# Patient Record
Sex: Female | Born: 1935
Health system: Southern US, Community
[De-identification: ages and names within clinical notes are randomized; demographics above are authoritative.]

## PROBLEM LIST (undated history)

## (undated) DIAGNOSIS — Z87898 Personal history of other specified conditions: Secondary | ICD-10-CM

## (undated) DIAGNOSIS — M2042 Other hammer toe(s) (acquired), left foot: Secondary | ICD-10-CM

## (undated) DIAGNOSIS — C539 Malignant neoplasm of cervix uteri, unspecified: Secondary | ICD-10-CM

## (undated) DIAGNOSIS — M1990 Unspecified osteoarthritis, unspecified site: Secondary | ICD-10-CM

## (undated) DIAGNOSIS — Z8781 Personal history of (healed) traumatic fracture: Secondary | ICD-10-CM

## (undated) DIAGNOSIS — M4846XA Fatigue fracture of vertebra, lumbar region, initial encounter for fracture: Secondary | ICD-10-CM

## (undated) DIAGNOSIS — Z8739 Personal history of other diseases of the musculoskeletal system and connective tissue: Secondary | ICD-10-CM

## (undated) DIAGNOSIS — Z6822 Body mass index (BMI) 22.0-22.9, adult: Secondary | ICD-10-CM

## (undated) DIAGNOSIS — Z8673 Personal history of transient ischemic attack (TIA), and cerebral infarction without residual deficits: Secondary | ICD-10-CM

## (undated) DIAGNOSIS — I10 Essential (primary) hypertension: Secondary | ICD-10-CM

## (undated) DIAGNOSIS — Z8719 Personal history of other diseases of the digestive system: Secondary | ICD-10-CM

## (undated) HISTORY — PX: CARDIAC SURGERY: SHX584

## (undated) HISTORY — DX: Malignant neoplasm of cervix uteri, unspecified: C53.9

## (undated) HISTORY — PX: CATARACT EXTRACTION: SUR2

## (undated) HISTORY — DX: Personal history of other diseases of the musculoskeletal system and connective tissue: Z87.39

## (undated) HISTORY — DX: Personal history of other specified conditions: Z87.898

## (undated) HISTORY — PX: ABDOMINAL HYSTERECTOMY: SHX81

## (undated) HISTORY — DX: Essential (primary) hypertension: I10

## (undated) HISTORY — DX: Personal history of (healed) traumatic fracture: Z87.81

## (undated) HISTORY — DX: Body mass index (BMI) 22.0-22.9, adult: Z68.22

## (undated) HISTORY — DX: Personal history of transient ischemic attack (TIA), and cerebral infarction without residual deficits: Z86.73

## (undated) HISTORY — DX: Personal history of other diseases of the digestive system: Z87.19

## (undated) HISTORY — PX: SMALL INTESTINE SURGERY: SHX150

---

## 1988-11-12 HISTORY — PX: OTHER SURGICAL HISTORY: SHX169

## 2010-11-12 DIAGNOSIS — Z8673 Personal history of transient ischemic attack (TIA), and cerebral infarction without residual deficits: Secondary | ICD-10-CM

## 2010-11-12 HISTORY — DX: Personal history of transient ischemic attack (TIA), and cerebral infarction without residual deficits: Z86.73

## 2015-11-13 DIAGNOSIS — F0280 Dementia in other diseases classified elsewhere without behavioral disturbance: Secondary | ICD-10-CM

## 2015-11-13 HISTORY — DX: Dementia in other diseases classified elsewhere, unspecified severity, without behavioral disturbance, psychotic disturbance, mood disturbance, and anxiety: F02.80

## 2016-12-17 DIAGNOSIS — R69 Illness, unspecified: Secondary | ICD-10-CM | POA: Diagnosis not present

## 2016-12-17 DIAGNOSIS — R609 Edema, unspecified: Secondary | ICD-10-CM | POA: Diagnosis not present

## 2016-12-17 DIAGNOSIS — R0982 Postnasal drip: Secondary | ICD-10-CM | POA: Diagnosis not present

## 2016-12-17 DIAGNOSIS — M1990 Unspecified osteoarthritis, unspecified site: Secondary | ICD-10-CM | POA: Diagnosis not present

## 2016-12-17 DIAGNOSIS — Z5181 Encounter for therapeutic drug level monitoring: Secondary | ICD-10-CM | POA: Diagnosis not present

## 2016-12-17 DIAGNOSIS — Z952 Presence of prosthetic heart valve: Secondary | ICD-10-CM | POA: Diagnosis not present

## 2016-12-17 DIAGNOSIS — Z7901 Long term (current) use of anticoagulants: Secondary | ICD-10-CM | POA: Diagnosis not present

## 2016-12-17 DIAGNOSIS — Z8673 Personal history of transient ischemic attack (TIA), and cerebral infarction without residual deficits: Secondary | ICD-10-CM | POA: Diagnosis not present

## 2016-12-24 DIAGNOSIS — Z7901 Long term (current) use of anticoagulants: Secondary | ICD-10-CM | POA: Diagnosis not present

## 2016-12-24 DIAGNOSIS — Z952 Presence of prosthetic heart valve: Secondary | ICD-10-CM | POA: Diagnosis not present

## 2016-12-24 DIAGNOSIS — Z5181 Encounter for therapeutic drug level monitoring: Secondary | ICD-10-CM | POA: Diagnosis not present

## 2016-12-31 DIAGNOSIS — I8390 Asymptomatic varicose veins of unspecified lower extremity: Secondary | ICD-10-CM | POA: Diagnosis not present

## 2016-12-31 DIAGNOSIS — M154 Erosive (osteo)arthritis: Secondary | ICD-10-CM | POA: Diagnosis not present

## 2016-12-31 DIAGNOSIS — M1710 Unilateral primary osteoarthritis, unspecified knee: Secondary | ICD-10-CM | POA: Diagnosis not present

## 2016-12-31 DIAGNOSIS — M7582 Other shoulder lesions, left shoulder: Secondary | ICD-10-CM | POA: Diagnosis not present

## 2016-12-31 DIAGNOSIS — M170 Bilateral primary osteoarthritis of knee: Secondary | ICD-10-CM | POA: Diagnosis not present

## 2016-12-31 DIAGNOSIS — M810 Age-related osteoporosis without current pathological fracture: Secondary | ICD-10-CM | POA: Diagnosis not present

## 2016-12-31 DIAGNOSIS — M159 Polyosteoarthritis, unspecified: Secondary | ICD-10-CM | POA: Diagnosis not present

## 2016-12-31 DIAGNOSIS — R5381 Other malaise: Secondary | ICD-10-CM | POA: Diagnosis not present

## 2017-01-02 DIAGNOSIS — R55 Syncope and collapse: Secondary | ICD-10-CM | POA: Diagnosis not present

## 2017-01-02 DIAGNOSIS — I4891 Unspecified atrial fibrillation: Secondary | ICD-10-CM | POA: Diagnosis not present

## 2017-01-02 DIAGNOSIS — Z952 Presence of prosthetic heart valve: Secondary | ICD-10-CM | POA: Diagnosis not present

## 2017-01-02 DIAGNOSIS — Z8673 Personal history of transient ischemic attack (TIA), and cerebral infarction without residual deficits: Secondary | ICD-10-CM | POA: Diagnosis not present

## 2017-01-02 DIAGNOSIS — R609 Edema, unspecified: Secondary | ICD-10-CM | POA: Diagnosis not present

## 2017-01-02 DIAGNOSIS — R002 Palpitations: Secondary | ICD-10-CM | POA: Diagnosis not present

## 2017-01-03 DIAGNOSIS — Z7901 Long term (current) use of anticoagulants: Secondary | ICD-10-CM | POA: Diagnosis not present

## 2017-01-03 DIAGNOSIS — D890 Polyclonal hypergammaglobulinemia: Secondary | ICD-10-CM | POA: Diagnosis not present

## 2017-01-03 DIAGNOSIS — I10 Essential (primary) hypertension: Secondary | ICD-10-CM | POA: Diagnosis not present

## 2017-01-03 DIAGNOSIS — Z8781 Personal history of (healed) traumatic fracture: Secondary | ICD-10-CM | POA: Diagnosis not present

## 2017-01-03 DIAGNOSIS — Z5181 Encounter for therapeutic drug level monitoring: Secondary | ICD-10-CM | POA: Diagnosis not present

## 2017-01-03 DIAGNOSIS — E785 Hyperlipidemia, unspecified: Secondary | ICD-10-CM | POA: Diagnosis not present

## 2017-01-03 DIAGNOSIS — Z952 Presence of prosthetic heart valve: Secondary | ICD-10-CM | POA: Diagnosis not present

## 2017-01-03 DIAGNOSIS — I491 Atrial premature depolarization: Secondary | ICD-10-CM | POA: Diagnosis not present

## 2017-01-03 DIAGNOSIS — Z6822 Body mass index (BMI) 22.0-22.9, adult: Secondary | ICD-10-CM | POA: Diagnosis not present

## 2017-01-03 DIAGNOSIS — R931 Abnormal findings on diagnostic imaging of heart and coronary circulation: Secondary | ICD-10-CM | POA: Diagnosis not present

## 2017-01-15 DIAGNOSIS — Z880 Allergy status to penicillin: Secondary | ICD-10-CM | POA: Diagnosis not present

## 2017-01-15 DIAGNOSIS — I10 Essential (primary) hypertension: Secondary | ICD-10-CM | POA: Diagnosis not present

## 2017-01-15 DIAGNOSIS — Z823 Family history of stroke: Secondary | ICD-10-CM | POA: Diagnosis not present

## 2017-01-15 DIAGNOSIS — Z8673 Personal history of transient ischemic attack (TIA), and cerebral infarction without residual deficits: Secondary | ICD-10-CM | POA: Diagnosis not present

## 2017-01-15 DIAGNOSIS — I6523 Occlusion and stenosis of bilateral carotid arteries: Secondary | ICD-10-CM | POA: Diagnosis not present

## 2017-01-15 DIAGNOSIS — M069 Rheumatoid arthritis, unspecified: Secondary | ICD-10-CM | POA: Diagnosis not present

## 2017-01-15 DIAGNOSIS — R531 Weakness: Secondary | ICD-10-CM | POA: Diagnosis not present

## 2017-01-15 DIAGNOSIS — I639 Cerebral infarction, unspecified: Secondary | ICD-10-CM | POA: Diagnosis not present

## 2017-01-15 DIAGNOSIS — E785 Hyperlipidemia, unspecified: Secondary | ICD-10-CM | POA: Diagnosis not present

## 2017-01-15 DIAGNOSIS — R079 Chest pain, unspecified: Secondary | ICD-10-CM | POA: Diagnosis not present

## 2017-01-15 DIAGNOSIS — G459 Transient cerebral ischemic attack, unspecified: Secondary | ICD-10-CM | POA: Diagnosis not present

## 2017-01-15 DIAGNOSIS — I440 Atrioventricular block, first degree: Secondary | ICD-10-CM | POA: Diagnosis not present

## 2017-01-15 DIAGNOSIS — I480 Paroxysmal atrial fibrillation: Secondary | ICD-10-CM | POA: Diagnosis not present

## 2017-01-15 DIAGNOSIS — R69 Illness, unspecified: Secondary | ICD-10-CM | POA: Diagnosis not present

## 2017-01-15 DIAGNOSIS — R9431 Abnormal electrocardiogram [ECG] [EKG]: Secondary | ICD-10-CM | POA: Diagnosis not present

## 2017-01-15 DIAGNOSIS — Z952 Presence of prosthetic heart valve: Secondary | ICD-10-CM | POA: Diagnosis not present

## 2017-01-15 DIAGNOSIS — Z7901 Long term (current) use of anticoagulants: Secondary | ICD-10-CM | POA: Diagnosis not present

## 2017-01-16 DIAGNOSIS — G459 Transient cerebral ischemic attack, unspecified: Secondary | ICD-10-CM | POA: Diagnosis not present

## 2017-01-16 DIAGNOSIS — Z7901 Long term (current) use of anticoagulants: Secondary | ICD-10-CM | POA: Diagnosis not present

## 2017-01-16 DIAGNOSIS — M069 Rheumatoid arthritis, unspecified: Secondary | ICD-10-CM | POA: Diagnosis not present

## 2017-01-16 DIAGNOSIS — Z952 Presence of prosthetic heart valve: Secondary | ICD-10-CM | POA: Diagnosis not present

## 2017-01-16 DIAGNOSIS — E785 Hyperlipidemia, unspecified: Secondary | ICD-10-CM | POA: Diagnosis not present

## 2017-01-16 DIAGNOSIS — I480 Paroxysmal atrial fibrillation: Secondary | ICD-10-CM | POA: Diagnosis not present

## 2017-01-22 DIAGNOSIS — I10 Essential (primary) hypertension: Secondary | ICD-10-CM | POA: Diagnosis not present

## 2017-01-22 DIAGNOSIS — Z09 Encounter for follow-up examination after completed treatment for conditions other than malignant neoplasm: Secondary | ICD-10-CM | POA: Diagnosis not present

## 2017-01-22 DIAGNOSIS — E785 Hyperlipidemia, unspecified: Secondary | ICD-10-CM | POA: Diagnosis not present

## 2017-01-22 DIAGNOSIS — R778 Other specified abnormalities of plasma proteins: Secondary | ICD-10-CM | POA: Diagnosis not present

## 2017-01-22 DIAGNOSIS — M159 Polyosteoarthritis, unspecified: Secondary | ICD-10-CM | POA: Diagnosis not present

## 2017-01-22 DIAGNOSIS — Z952 Presence of prosthetic heart valve: Secondary | ICD-10-CM | POA: Diagnosis not present

## 2017-01-22 DIAGNOSIS — E871 Hypo-osmolality and hyponatremia: Secondary | ICD-10-CM | POA: Diagnosis not present

## 2017-01-22 DIAGNOSIS — R5383 Other fatigue: Secondary | ICD-10-CM | POA: Diagnosis not present

## 2017-01-22 DIAGNOSIS — R69 Illness, unspecified: Secondary | ICD-10-CM | POA: Diagnosis not present

## 2017-01-25 DIAGNOSIS — D472 Monoclonal gammopathy: Secondary | ICD-10-CM | POA: Diagnosis not present

## 2017-01-25 DIAGNOSIS — M810 Age-related osteoporosis without current pathological fracture: Secondary | ICD-10-CM | POA: Diagnosis not present

## 2017-01-25 DIAGNOSIS — E559 Vitamin D deficiency, unspecified: Secondary | ICD-10-CM | POA: Diagnosis not present

## 2017-01-25 DIAGNOSIS — R5383 Other fatigue: Secondary | ICD-10-CM | POA: Diagnosis not present

## 2017-01-25 DIAGNOSIS — M159 Polyosteoarthritis, unspecified: Secondary | ICD-10-CM | POA: Diagnosis not present

## 2017-02-05 DIAGNOSIS — M5136 Other intervertebral disc degeneration, lumbar region: Secondary | ICD-10-CM | POA: Diagnosis not present

## 2017-02-05 DIAGNOSIS — M9913 Subluxation complex (vertebral) of lumbar region: Secondary | ICD-10-CM | POA: Diagnosis not present

## 2017-02-06 DIAGNOSIS — Z7901 Long term (current) use of anticoagulants: Secondary | ICD-10-CM | POA: Diagnosis not present

## 2017-02-06 DIAGNOSIS — Z5181 Encounter for therapeutic drug level monitoring: Secondary | ICD-10-CM | POA: Diagnosis not present

## 2017-02-06 DIAGNOSIS — Z952 Presence of prosthetic heart valve: Secondary | ICD-10-CM | POA: Diagnosis not present

## 2017-02-06 DIAGNOSIS — Z8673 Personal history of transient ischemic attack (TIA), and cerebral infarction without residual deficits: Secondary | ICD-10-CM | POA: Diagnosis not present

## 2017-02-07 DIAGNOSIS — M9913 Subluxation complex (vertebral) of lumbar region: Secondary | ICD-10-CM | POA: Diagnosis not present

## 2017-02-07 DIAGNOSIS — M5136 Other intervertebral disc degeneration, lumbar region: Secondary | ICD-10-CM | POA: Diagnosis not present

## 2017-02-11 DIAGNOSIS — M9913 Subluxation complex (vertebral) of lumbar region: Secondary | ICD-10-CM | POA: Diagnosis not present

## 2017-02-11 DIAGNOSIS — M5136 Other intervertebral disc degeneration, lumbar region: Secondary | ICD-10-CM | POA: Diagnosis not present

## 2017-02-13 DIAGNOSIS — Z952 Presence of prosthetic heart valve: Secondary | ICD-10-CM | POA: Diagnosis not present

## 2017-02-13 DIAGNOSIS — Z5181 Encounter for therapeutic drug level monitoring: Secondary | ICD-10-CM | POA: Diagnosis not present

## 2017-02-13 DIAGNOSIS — M9913 Subluxation complex (vertebral) of lumbar region: Secondary | ICD-10-CM | POA: Diagnosis not present

## 2017-02-13 DIAGNOSIS — Z7901 Long term (current) use of anticoagulants: Secondary | ICD-10-CM | POA: Diagnosis not present

## 2017-02-13 DIAGNOSIS — M5136 Other intervertebral disc degeneration, lumbar region: Secondary | ICD-10-CM | POA: Diagnosis not present

## 2017-02-14 DIAGNOSIS — M9913 Subluxation complex (vertebral) of lumbar region: Secondary | ICD-10-CM | POA: Diagnosis not present

## 2017-02-14 DIAGNOSIS — M5136 Other intervertebral disc degeneration, lumbar region: Secondary | ICD-10-CM | POA: Diagnosis not present

## 2017-02-18 DIAGNOSIS — M5136 Other intervertebral disc degeneration, lumbar region: Secondary | ICD-10-CM | POA: Diagnosis not present

## 2017-02-18 DIAGNOSIS — M9913 Subluxation complex (vertebral) of lumbar region: Secondary | ICD-10-CM | POA: Diagnosis not present

## 2017-02-20 DIAGNOSIS — M5136 Other intervertebral disc degeneration, lumbar region: Secondary | ICD-10-CM | POA: Diagnosis not present

## 2017-02-20 DIAGNOSIS — Z5181 Encounter for therapeutic drug level monitoring: Secondary | ICD-10-CM | POA: Diagnosis not present

## 2017-02-20 DIAGNOSIS — Z952 Presence of prosthetic heart valve: Secondary | ICD-10-CM | POA: Diagnosis not present

## 2017-02-20 DIAGNOSIS — Z7901 Long term (current) use of anticoagulants: Secondary | ICD-10-CM | POA: Diagnosis not present

## 2017-02-20 DIAGNOSIS — M9913 Subluxation complex (vertebral) of lumbar region: Secondary | ICD-10-CM | POA: Diagnosis not present

## 2017-02-21 DIAGNOSIS — M5136 Other intervertebral disc degeneration, lumbar region: Secondary | ICD-10-CM | POA: Diagnosis not present

## 2017-02-21 DIAGNOSIS — M9913 Subluxation complex (vertebral) of lumbar region: Secondary | ICD-10-CM | POA: Diagnosis not present

## 2017-02-25 DIAGNOSIS — M9913 Subluxation complex (vertebral) of lumbar region: Secondary | ICD-10-CM | POA: Diagnosis not present

## 2017-02-25 DIAGNOSIS — M5136 Other intervertebral disc degeneration, lumbar region: Secondary | ICD-10-CM | POA: Diagnosis not present

## 2017-02-27 DIAGNOSIS — M5136 Other intervertebral disc degeneration, lumbar region: Secondary | ICD-10-CM | POA: Diagnosis not present

## 2017-02-27 DIAGNOSIS — M9913 Subluxation complex (vertebral) of lumbar region: Secondary | ICD-10-CM | POA: Diagnosis not present

## 2017-02-28 DIAGNOSIS — M9913 Subluxation complex (vertebral) of lumbar region: Secondary | ICD-10-CM | POA: Diagnosis not present

## 2017-02-28 DIAGNOSIS — M5136 Other intervertebral disc degeneration, lumbar region: Secondary | ICD-10-CM | POA: Diagnosis not present

## 2017-03-04 DIAGNOSIS — M9913 Subluxation complex (vertebral) of lumbar region: Secondary | ICD-10-CM | POA: Diagnosis not present

## 2017-03-04 DIAGNOSIS — M5136 Other intervertebral disc degeneration, lumbar region: Secondary | ICD-10-CM | POA: Diagnosis not present

## 2017-03-06 DIAGNOSIS — M5136 Other intervertebral disc degeneration, lumbar region: Secondary | ICD-10-CM | POA: Diagnosis not present

## 2017-03-06 DIAGNOSIS — Z5181 Encounter for therapeutic drug level monitoring: Secondary | ICD-10-CM | POA: Diagnosis not present

## 2017-03-06 DIAGNOSIS — I10 Essential (primary) hypertension: Secondary | ICD-10-CM | POA: Diagnosis not present

## 2017-03-06 DIAGNOSIS — Z952 Presence of prosthetic heart valve: Secondary | ICD-10-CM | POA: Diagnosis not present

## 2017-03-06 DIAGNOSIS — R69 Illness, unspecified: Secondary | ICD-10-CM | POA: Diagnosis not present

## 2017-03-06 DIAGNOSIS — R7309 Other abnormal glucose: Secondary | ICD-10-CM | POA: Diagnosis not present

## 2017-03-06 DIAGNOSIS — E785 Hyperlipidemia, unspecified: Secondary | ICD-10-CM | POA: Diagnosis not present

## 2017-03-06 DIAGNOSIS — M159 Polyosteoarthritis, unspecified: Secondary | ICD-10-CM | POA: Diagnosis not present

## 2017-03-06 DIAGNOSIS — M9913 Subluxation complex (vertebral) of lumbar region: Secondary | ICD-10-CM | POA: Diagnosis not present

## 2017-03-06 DIAGNOSIS — Z7901 Long term (current) use of anticoagulants: Secondary | ICD-10-CM | POA: Diagnosis not present

## 2017-03-06 DIAGNOSIS — E871 Hypo-osmolality and hyponatremia: Secondary | ICD-10-CM | POA: Diagnosis not present

## 2017-03-07 DIAGNOSIS — M5136 Other intervertebral disc degeneration, lumbar region: Secondary | ICD-10-CM | POA: Diagnosis not present

## 2017-03-07 DIAGNOSIS — M9913 Subluxation complex (vertebral) of lumbar region: Secondary | ICD-10-CM | POA: Diagnosis not present

## 2017-03-11 DIAGNOSIS — M5136 Other intervertebral disc degeneration, lumbar region: Secondary | ICD-10-CM | POA: Diagnosis not present

## 2017-03-11 DIAGNOSIS — M9913 Subluxation complex (vertebral) of lumbar region: Secondary | ICD-10-CM | POA: Diagnosis not present

## 2017-03-12 DIAGNOSIS — M5136 Other intervertebral disc degeneration, lumbar region: Secondary | ICD-10-CM | POA: Diagnosis not present

## 2017-03-12 DIAGNOSIS — M9913 Subluxation complex (vertebral) of lumbar region: Secondary | ICD-10-CM | POA: Diagnosis not present

## 2017-04-01 DIAGNOSIS — M9912 Subluxation complex (vertebral) of thoracic region: Secondary | ICD-10-CM | POA: Diagnosis not present

## 2017-04-01 DIAGNOSIS — M5134 Other intervertebral disc degeneration, thoracic region: Secondary | ICD-10-CM | POA: Diagnosis not present

## 2017-04-01 DIAGNOSIS — Z952 Presence of prosthetic heart valve: Secondary | ICD-10-CM | POA: Diagnosis not present

## 2017-04-01 DIAGNOSIS — Z7901 Long term (current) use of anticoagulants: Secondary | ICD-10-CM | POA: Diagnosis not present

## 2017-04-01 DIAGNOSIS — Z5181 Encounter for therapeutic drug level monitoring: Secondary | ICD-10-CM | POA: Diagnosis not present

## 2017-04-03 DIAGNOSIS — M5134 Other intervertebral disc degeneration, thoracic region: Secondary | ICD-10-CM | POA: Diagnosis not present

## 2017-04-03 DIAGNOSIS — M9912 Subluxation complex (vertebral) of thoracic region: Secondary | ICD-10-CM | POA: Diagnosis not present

## 2017-04-04 DIAGNOSIS — M5134 Other intervertebral disc degeneration, thoracic region: Secondary | ICD-10-CM | POA: Diagnosis not present

## 2017-04-04 DIAGNOSIS — M9912 Subluxation complex (vertebral) of thoracic region: Secondary | ICD-10-CM | POA: Diagnosis not present

## 2017-04-09 DIAGNOSIS — M5134 Other intervertebral disc degeneration, thoracic region: Secondary | ICD-10-CM | POA: Diagnosis not present

## 2017-04-09 DIAGNOSIS — M9912 Subluxation complex (vertebral) of thoracic region: Secondary | ICD-10-CM | POA: Diagnosis not present

## 2017-04-10 DIAGNOSIS — I872 Venous insufficiency (chronic) (peripheral): Secondary | ICD-10-CM | POA: Diagnosis not present

## 2017-04-10 DIAGNOSIS — M5134 Other intervertebral disc degeneration, thoracic region: Secondary | ICD-10-CM | POA: Diagnosis not present

## 2017-04-10 DIAGNOSIS — E785 Hyperlipidemia, unspecified: Secondary | ICD-10-CM | POA: Diagnosis not present

## 2017-04-10 DIAGNOSIS — M9912 Subluxation complex (vertebral) of thoracic region: Secondary | ICD-10-CM | POA: Diagnosis not present

## 2017-04-10 DIAGNOSIS — D179 Benign lipomatous neoplasm, unspecified: Secondary | ICD-10-CM | POA: Diagnosis not present

## 2017-04-10 DIAGNOSIS — I10 Essential (primary) hypertension: Secondary | ICD-10-CM | POA: Diagnosis not present

## 2017-04-10 DIAGNOSIS — R69 Illness, unspecified: Secondary | ICD-10-CM | POA: Diagnosis not present

## 2017-04-11 DIAGNOSIS — M5134 Other intervertebral disc degeneration, thoracic region: Secondary | ICD-10-CM | POA: Diagnosis not present

## 2017-04-11 DIAGNOSIS — M9912 Subluxation complex (vertebral) of thoracic region: Secondary | ICD-10-CM | POA: Diagnosis not present

## 2017-04-15 DIAGNOSIS — M9912 Subluxation complex (vertebral) of thoracic region: Secondary | ICD-10-CM | POA: Diagnosis not present

## 2017-04-15 DIAGNOSIS — M5134 Other intervertebral disc degeneration, thoracic region: Secondary | ICD-10-CM | POA: Diagnosis not present

## 2017-04-17 DIAGNOSIS — M5134 Other intervertebral disc degeneration, thoracic region: Secondary | ICD-10-CM | POA: Diagnosis not present

## 2017-04-17 DIAGNOSIS — M9912 Subluxation complex (vertebral) of thoracic region: Secondary | ICD-10-CM | POA: Diagnosis not present

## 2017-04-18 DIAGNOSIS — M9912 Subluxation complex (vertebral) of thoracic region: Secondary | ICD-10-CM | POA: Diagnosis not present

## 2017-04-18 DIAGNOSIS — M5134 Other intervertebral disc degeneration, thoracic region: Secondary | ICD-10-CM | POA: Diagnosis not present

## 2017-04-23 DIAGNOSIS — Z5181 Encounter for therapeutic drug level monitoring: Secondary | ICD-10-CM | POA: Diagnosis not present

## 2017-04-23 DIAGNOSIS — M5134 Other intervertebral disc degeneration, thoracic region: Secondary | ICD-10-CM | POA: Diagnosis not present

## 2017-04-23 DIAGNOSIS — Z7901 Long term (current) use of anticoagulants: Secondary | ICD-10-CM | POA: Diagnosis not present

## 2017-04-23 DIAGNOSIS — M9912 Subluxation complex (vertebral) of thoracic region: Secondary | ICD-10-CM | POA: Diagnosis not present

## 2017-04-23 DIAGNOSIS — Z952 Presence of prosthetic heart valve: Secondary | ICD-10-CM | POA: Diagnosis not present

## 2017-04-24 DIAGNOSIS — M9912 Subluxation complex (vertebral) of thoracic region: Secondary | ICD-10-CM | POA: Diagnosis not present

## 2017-04-24 DIAGNOSIS — M5134 Other intervertebral disc degeneration, thoracic region: Secondary | ICD-10-CM | POA: Diagnosis not present

## 2017-04-25 DIAGNOSIS — M5134 Other intervertebral disc degeneration, thoracic region: Secondary | ICD-10-CM | POA: Diagnosis not present

## 2017-04-25 DIAGNOSIS — M9912 Subluxation complex (vertebral) of thoracic region: Secondary | ICD-10-CM | POA: Diagnosis not present

## 2017-04-29 DIAGNOSIS — M5134 Other intervertebral disc degeneration, thoracic region: Secondary | ICD-10-CM | POA: Diagnosis not present

## 2017-04-29 DIAGNOSIS — M9912 Subluxation complex (vertebral) of thoracic region: Secondary | ICD-10-CM | POA: Diagnosis not present

## 2017-05-01 DIAGNOSIS — M5134 Other intervertebral disc degeneration, thoracic region: Secondary | ICD-10-CM | POA: Diagnosis not present

## 2017-05-01 DIAGNOSIS — M9912 Subluxation complex (vertebral) of thoracic region: Secondary | ICD-10-CM | POA: Diagnosis not present

## 2017-05-02 DIAGNOSIS — M5134 Other intervertebral disc degeneration, thoracic region: Secondary | ICD-10-CM | POA: Diagnosis not present

## 2017-05-02 DIAGNOSIS — M9912 Subluxation complex (vertebral) of thoracic region: Secondary | ICD-10-CM | POA: Diagnosis not present

## 2017-05-06 DIAGNOSIS — M9912 Subluxation complex (vertebral) of thoracic region: Secondary | ICD-10-CM | POA: Diagnosis not present

## 2017-05-06 DIAGNOSIS — M5134 Other intervertebral disc degeneration, thoracic region: Secondary | ICD-10-CM | POA: Diagnosis not present

## 2017-05-09 DIAGNOSIS — M5134 Other intervertebral disc degeneration, thoracic region: Secondary | ICD-10-CM | POA: Diagnosis not present

## 2017-05-09 DIAGNOSIS — M9912 Subluxation complex (vertebral) of thoracic region: Secondary | ICD-10-CM | POA: Diagnosis not present

## 2017-05-13 DIAGNOSIS — R7309 Other abnormal glucose: Secondary | ICD-10-CM | POA: Diagnosis not present

## 2017-05-13 DIAGNOSIS — R1310 Dysphagia, unspecified: Secondary | ICD-10-CM | POA: Diagnosis not present

## 2017-05-13 DIAGNOSIS — Z5181 Encounter for therapeutic drug level monitoring: Secondary | ICD-10-CM | POA: Diagnosis not present

## 2017-05-13 DIAGNOSIS — Z139 Encounter for screening, unspecified: Secondary | ICD-10-CM | POA: Diagnosis not present

## 2017-05-13 DIAGNOSIS — E871 Hypo-osmolality and hyponatremia: Secondary | ICD-10-CM | POA: Diagnosis not present

## 2017-05-13 DIAGNOSIS — Z952 Presence of prosthetic heart valve: Secondary | ICD-10-CM | POA: Diagnosis not present

## 2017-05-13 DIAGNOSIS — I10 Essential (primary) hypertension: Secondary | ICD-10-CM | POA: Diagnosis not present

## 2017-05-13 DIAGNOSIS — R69 Illness, unspecified: Secondary | ICD-10-CM | POA: Diagnosis not present

## 2017-05-13 DIAGNOSIS — M9912 Subluxation complex (vertebral) of thoracic region: Secondary | ICD-10-CM | POA: Diagnosis not present

## 2017-05-13 DIAGNOSIS — E785 Hyperlipidemia, unspecified: Secondary | ICD-10-CM | POA: Diagnosis not present

## 2017-05-13 DIAGNOSIS — Z7901 Long term (current) use of anticoagulants: Secondary | ICD-10-CM | POA: Diagnosis not present

## 2017-05-13 DIAGNOSIS — M5134 Other intervertebral disc degeneration, thoracic region: Secondary | ICD-10-CM | POA: Diagnosis not present

## 2017-05-14 DIAGNOSIS — M9912 Subluxation complex (vertebral) of thoracic region: Secondary | ICD-10-CM | POA: Diagnosis not present

## 2017-05-14 DIAGNOSIS — M5134 Other intervertebral disc degeneration, thoracic region: Secondary | ICD-10-CM | POA: Diagnosis not present

## 2017-05-17 DIAGNOSIS — R531 Weakness: Secondary | ICD-10-CM | POA: Diagnosis not present

## 2017-05-17 DIAGNOSIS — R69 Illness, unspecified: Secondary | ICD-10-CM | POA: Diagnosis not present

## 2017-05-20 DIAGNOSIS — M9912 Subluxation complex (vertebral) of thoracic region: Secondary | ICD-10-CM | POA: Diagnosis not present

## 2017-05-20 DIAGNOSIS — M5134 Other intervertebral disc degeneration, thoracic region: Secondary | ICD-10-CM | POA: Diagnosis not present

## 2017-05-22 DIAGNOSIS — M9912 Subluxation complex (vertebral) of thoracic region: Secondary | ICD-10-CM | POA: Diagnosis not present

## 2017-05-22 DIAGNOSIS — M5134 Other intervertebral disc degeneration, thoracic region: Secondary | ICD-10-CM | POA: Diagnosis not present

## 2017-05-23 DIAGNOSIS — M9912 Subluxation complex (vertebral) of thoracic region: Secondary | ICD-10-CM | POA: Diagnosis not present

## 2017-05-23 DIAGNOSIS — M5134 Other intervertebral disc degeneration, thoracic region: Secondary | ICD-10-CM | POA: Diagnosis not present

## 2017-05-30 DIAGNOSIS — R1310 Dysphagia, unspecified: Secondary | ICD-10-CM | POA: Diagnosis not present

## 2017-06-03 DIAGNOSIS — M9912 Subluxation complex (vertebral) of thoracic region: Secondary | ICD-10-CM | POA: Diagnosis not present

## 2017-06-03 DIAGNOSIS — M5134 Other intervertebral disc degeneration, thoracic region: Secondary | ICD-10-CM | POA: Diagnosis not present

## 2017-06-05 DIAGNOSIS — M5134 Other intervertebral disc degeneration, thoracic region: Secondary | ICD-10-CM | POA: Diagnosis not present

## 2017-06-05 DIAGNOSIS — M9912 Subluxation complex (vertebral) of thoracic region: Secondary | ICD-10-CM | POA: Diagnosis not present

## 2017-06-10 DIAGNOSIS — M9911 Subluxation complex (vertebral) of cervical region: Secondary | ICD-10-CM | POA: Diagnosis not present

## 2017-06-10 DIAGNOSIS — M5030 Other cervical disc degeneration, unspecified cervical region: Secondary | ICD-10-CM | POA: Diagnosis not present

## 2017-06-13 DIAGNOSIS — M5030 Other cervical disc degeneration, unspecified cervical region: Secondary | ICD-10-CM | POA: Diagnosis not present

## 2017-06-13 DIAGNOSIS — M9911 Subluxation complex (vertebral) of cervical region: Secondary | ICD-10-CM | POA: Diagnosis not present

## 2017-06-17 DIAGNOSIS — M5030 Other cervical disc degeneration, unspecified cervical region: Secondary | ICD-10-CM | POA: Diagnosis not present

## 2017-06-17 DIAGNOSIS — M9911 Subluxation complex (vertebral) of cervical region: Secondary | ICD-10-CM | POA: Diagnosis not present

## 2017-06-19 DIAGNOSIS — Z952 Presence of prosthetic heart valve: Secondary | ICD-10-CM | POA: Diagnosis not present

## 2017-06-19 DIAGNOSIS — Z5181 Encounter for therapeutic drug level monitoring: Secondary | ICD-10-CM | POA: Diagnosis not present

## 2017-06-19 DIAGNOSIS — Z8673 Personal history of transient ischemic attack (TIA), and cerebral infarction without residual deficits: Secondary | ICD-10-CM | POA: Diagnosis not present

## 2017-06-19 DIAGNOSIS — M9911 Subluxation complex (vertebral) of cervical region: Secondary | ICD-10-CM | POA: Diagnosis not present

## 2017-06-19 DIAGNOSIS — Z7901 Long term (current) use of anticoagulants: Secondary | ICD-10-CM | POA: Diagnosis not present

## 2017-06-19 DIAGNOSIS — M5030 Other cervical disc degeneration, unspecified cervical region: Secondary | ICD-10-CM | POA: Diagnosis not present

## 2017-06-24 DIAGNOSIS — M9911 Subluxation complex (vertebral) of cervical region: Secondary | ICD-10-CM | POA: Diagnosis not present

## 2017-06-24 DIAGNOSIS — M5030 Other cervical disc degeneration, unspecified cervical region: Secondary | ICD-10-CM | POA: Diagnosis not present

## 2017-06-27 DIAGNOSIS — M9911 Subluxation complex (vertebral) of cervical region: Secondary | ICD-10-CM | POA: Diagnosis not present

## 2017-06-27 DIAGNOSIS — M5030 Other cervical disc degeneration, unspecified cervical region: Secondary | ICD-10-CM | POA: Diagnosis not present

## 2017-07-02 DIAGNOSIS — M9911 Subluxation complex (vertebral) of cervical region: Secondary | ICD-10-CM | POA: Diagnosis not present

## 2017-07-02 DIAGNOSIS — M5030 Other cervical disc degeneration, unspecified cervical region: Secondary | ICD-10-CM | POA: Diagnosis not present

## 2017-07-04 DIAGNOSIS — M9911 Subluxation complex (vertebral) of cervical region: Secondary | ICD-10-CM | POA: Diagnosis not present

## 2017-07-04 DIAGNOSIS — Z7901 Long term (current) use of anticoagulants: Secondary | ICD-10-CM | POA: Diagnosis not present

## 2017-07-04 DIAGNOSIS — I491 Atrial premature depolarization: Secondary | ICD-10-CM | POA: Diagnosis not present

## 2017-07-04 DIAGNOSIS — Z952 Presence of prosthetic heart valve: Secondary | ICD-10-CM | POA: Diagnosis not present

## 2017-07-04 DIAGNOSIS — E785 Hyperlipidemia, unspecified: Secondary | ICD-10-CM | POA: Diagnosis not present

## 2017-07-04 DIAGNOSIS — M5030 Other cervical disc degeneration, unspecified cervical region: Secondary | ICD-10-CM | POA: Diagnosis not present

## 2017-07-04 DIAGNOSIS — D890 Polyclonal hypergammaglobulinemia: Secondary | ICD-10-CM | POA: Diagnosis not present

## 2017-07-04 DIAGNOSIS — R609 Edema, unspecified: Secondary | ICD-10-CM | POA: Diagnosis not present

## 2017-07-04 DIAGNOSIS — R931 Abnormal findings on diagnostic imaging of heart and coronary circulation: Secondary | ICD-10-CM | POA: Diagnosis not present

## 2017-07-04 DIAGNOSIS — I77810 Thoracic aortic ectasia: Secondary | ICD-10-CM | POA: Diagnosis not present

## 2017-07-04 DIAGNOSIS — I10 Essential (primary) hypertension: Secondary | ICD-10-CM | POA: Diagnosis not present

## 2017-07-04 DIAGNOSIS — Z5181 Encounter for therapeutic drug level monitoring: Secondary | ICD-10-CM | POA: Diagnosis not present

## 2017-07-05 DIAGNOSIS — Z952 Presence of prosthetic heart valve: Secondary | ICD-10-CM | POA: Diagnosis not present

## 2017-07-05 DIAGNOSIS — Z5181 Encounter for therapeutic drug level monitoring: Secondary | ICD-10-CM | POA: Diagnosis not present

## 2017-07-05 DIAGNOSIS — Z8673 Personal history of transient ischemic attack (TIA), and cerebral infarction without residual deficits: Secondary | ICD-10-CM | POA: Diagnosis not present

## 2017-07-05 DIAGNOSIS — Z7901 Long term (current) use of anticoagulants: Secondary | ICD-10-CM | POA: Diagnosis not present

## 2017-07-08 DIAGNOSIS — M5030 Other cervical disc degeneration, unspecified cervical region: Secondary | ICD-10-CM | POA: Diagnosis not present

## 2017-07-08 DIAGNOSIS — M9911 Subluxation complex (vertebral) of cervical region: Secondary | ICD-10-CM | POA: Diagnosis not present

## 2017-07-11 DIAGNOSIS — R4181 Age-related cognitive decline: Secondary | ICD-10-CM | POA: Diagnosis not present

## 2017-07-11 DIAGNOSIS — R419 Unspecified symptoms and signs involving cognitive functions and awareness: Secondary | ICD-10-CM | POA: Diagnosis not present

## 2017-07-16 DIAGNOSIS — M9911 Subluxation complex (vertebral) of cervical region: Secondary | ICD-10-CM | POA: Diagnosis not present

## 2017-07-16 DIAGNOSIS — M5030 Other cervical disc degeneration, unspecified cervical region: Secondary | ICD-10-CM | POA: Diagnosis not present

## 2017-07-17 DIAGNOSIS — R9431 Abnormal electrocardiogram [ECG] [EKG]: Secondary | ICD-10-CM | POA: Diagnosis not present

## 2017-07-17 DIAGNOSIS — I252 Old myocardial infarction: Secondary | ICD-10-CM | POA: Diagnosis not present

## 2017-07-17 DIAGNOSIS — Z952 Presence of prosthetic heart valve: Secondary | ICD-10-CM | POA: Diagnosis not present

## 2017-07-17 DIAGNOSIS — R531 Weakness: Secondary | ICD-10-CM | POA: Diagnosis not present

## 2017-07-17 DIAGNOSIS — W010XXA Fall on same level from slipping, tripping and stumbling without subsequent striking against object, initial encounter: Secondary | ICD-10-CM | POA: Diagnosis not present

## 2017-07-17 DIAGNOSIS — I10 Essential (primary) hypertension: Secondary | ICD-10-CM | POA: Diagnosis not present

## 2017-07-17 DIAGNOSIS — R51 Headache: Secondary | ICD-10-CM | POA: Diagnosis not present

## 2017-07-17 DIAGNOSIS — M542 Cervicalgia: Secondary | ICD-10-CM | POA: Diagnosis not present

## 2017-07-17 DIAGNOSIS — S060X0A Concussion without loss of consciousness, initial encounter: Secondary | ICD-10-CM | POA: Diagnosis not present

## 2017-07-17 DIAGNOSIS — G8929 Other chronic pain: Secondary | ICD-10-CM | POA: Diagnosis not present

## 2017-07-17 DIAGNOSIS — S0990XA Unspecified injury of head, initial encounter: Secondary | ICD-10-CM | POA: Diagnosis not present

## 2017-07-17 DIAGNOSIS — E785 Hyperlipidemia, unspecified: Secondary | ICD-10-CM | POA: Diagnosis not present

## 2017-07-17 DIAGNOSIS — R69 Illness, unspecified: Secondary | ICD-10-CM | POA: Diagnosis not present

## 2017-07-26 DIAGNOSIS — K5909 Other constipation: Secondary | ICD-10-CM | POA: Diagnosis not present

## 2017-07-26 DIAGNOSIS — Z5181 Encounter for therapeutic drug level monitoring: Secondary | ICD-10-CM | POA: Diagnosis not present

## 2017-07-26 DIAGNOSIS — Z7901 Long term (current) use of anticoagulants: Secondary | ICD-10-CM | POA: Diagnosis not present

## 2017-07-26 DIAGNOSIS — K5730 Diverticulosis of large intestine without perforation or abscess without bleeding: Secondary | ICD-10-CM | POA: Diagnosis not present

## 2017-07-26 DIAGNOSIS — R1032 Left lower quadrant pain: Secondary | ICD-10-CM | POA: Diagnosis not present

## 2017-07-26 DIAGNOSIS — Z952 Presence of prosthetic heart valve: Secondary | ICD-10-CM | POA: Diagnosis not present

## 2017-07-26 DIAGNOSIS — Z8673 Personal history of transient ischemic attack (TIA), and cerebral infarction without residual deficits: Secondary | ICD-10-CM | POA: Diagnosis not present

## 2017-08-19 DIAGNOSIS — Z5181 Encounter for therapeutic drug level monitoring: Secondary | ICD-10-CM | POA: Diagnosis not present

## 2017-08-19 DIAGNOSIS — Z7901 Long term (current) use of anticoagulants: Secondary | ICD-10-CM | POA: Diagnosis not present

## 2017-08-19 DIAGNOSIS — Z952 Presence of prosthetic heart valve: Secondary | ICD-10-CM | POA: Diagnosis not present

## 2017-09-05 ENCOUNTER — Ambulatory Visit (INDEPENDENT_AMBULATORY_CARE_PROVIDER_SITE_OTHER): Payer: Medicare HMO | Admitting: *Deleted

## 2017-09-05 ENCOUNTER — Encounter (INDEPENDENT_AMBULATORY_CARE_PROVIDER_SITE_OTHER): Payer: Self-pay

## 2017-09-05 ENCOUNTER — Ambulatory Visit (INDEPENDENT_AMBULATORY_CARE_PROVIDER_SITE_OTHER): Payer: Medicare HMO | Admitting: Interventional Cardiology

## 2017-09-05 VITALS — BP 154/90 | HR 73 | Ht 64.5 in | Wt 135.0 lb

## 2017-09-05 DIAGNOSIS — I10 Essential (primary) hypertension: Secondary | ICD-10-CM | POA: Diagnosis not present

## 2017-09-05 DIAGNOSIS — Z952 Presence of prosthetic heart valve: Secondary | ICD-10-CM

## 2017-09-05 DIAGNOSIS — R55 Syncope and collapse: Secondary | ICD-10-CM | POA: Diagnosis not present

## 2017-09-05 DIAGNOSIS — E785 Hyperlipidemia, unspecified: Secondary | ICD-10-CM

## 2017-09-05 DIAGNOSIS — G4733 Obstructive sleep apnea (adult) (pediatric): Secondary | ICD-10-CM

## 2017-09-05 DIAGNOSIS — Z7901 Long term (current) use of anticoagulants: Secondary | ICD-10-CM | POA: Diagnosis not present

## 2017-09-05 DIAGNOSIS — I491 Atrial premature depolarization: Secondary | ICD-10-CM

## 2017-09-05 LAB — POCT INR: INR: 2.4

## 2017-09-05 NOTE — Patient Instructions (Signed)
Medication Instructions:  Your physician recommends that you continue on your current medications as directed. Please refer to the Current Medication list given to you today.  Labwork: None  Testing/Procedures: None  Follow-Up: Your physician recommends that you schedule a follow-up appointment in: 4 months with Dr. Tamala Julian.    Any Other Special Instructions Will Be Listed Below (If Applicable).     If you need a refill on your cardiac medications before your next appointment, please call your pharmacy.

## 2017-09-05 NOTE — Progress Notes (Signed)
Cardiology Office Note    Date:  09/05/2017   ID:  [name], DOB [birth year], MRN [id number]  PCP:  Patient, No Pcp Per  Cardiologist: Sinclair Grooms, MD   Chief Complaint  Patient presents with  . Cardiac Valve Problem    establish care    History of Present Illness:  Virginia Franklin is a 83 y.o. female here to establish for chronic longitudinal cardiology follow-up with a history of essential hypertension, embolic CVA, mechanical St. Jude aortic valve for rheumatic fever 1990, and chronicCoumadin therapy.  She has no complaints. She relocated to [place] from [facility]. She is of [place] heritage. She had rheumatic fever as a child. She underwent aortic valve replacement in 1990. She has done well since that time on chronic Coumadin therapy. There is no history of coronary artery disease. She denies orthopnea, PND, and angina. She has had some recent falls. She had one episode of syncope a year ago. Etiology was not found. She had recent echocardiography performed within the past 6 months to reveal normal valve function.  Past Medical History:  Diagnosis Date  . Body mass index (BMI) of 22.0-22.9 in adult   . Cervical cancer ([place])   . H/O cervical fracture   . H/O osteoporosis   . H/O rheumatoid arthritis   . H/O: CVA (cerebrovascular accident) 2012  . H/O: osteoarthritis   . History of urinary frequency   . Hx of diverticulitis of colon   . Hypertension     Past Surgical History:  Procedure Laterality Date  . ABDOMINAL HYSTERECTOMY    . CARDIAC SURGERY    . CATARACT EXTRACTION    . SMALL INTESTINE SURGERY      Current Medications: Outpatient Medications Prior to Visit  Medication Sig Dispense Refill  . acetaminophen (TYLENOL) 650 MG CR tablet Take 650 mg by mouth every 8 (eight) hours as needed for pain.    Marland Kitchen escitalopram (LEXAPRO) 5 MG tablet Take 5 mg by mouth daily.    Marland Kitchen ezetimibe-simvastatin (VYTORIN) 10-40 MG tablet Take 1 tablet by mouth  daily.    . furosemide (LASIX) 20 MG tablet Take 20 mg by mouth.    Marland Kitchen lisinopril (PRINIVIL,ZESTRIL) 10 MG tablet Take 10 mg by mouth daily.    . Turmeric, Curcuma Longa, (TURMERIC ROOT) POWD by Does not apply route.    . warfarin (COUMADIN) 5 MG tablet Take 5 mg by mouth daily.    Marland Kitchen warfarin (COUMADIN) 7.5 MG tablet Take 7.5 mg by mouth daily.     No facility-administered medications prior to visit.      Allergies:   Penicillins   Social History   Social History  . Marital status: Widowed    Spouse name: N/A  . Number of children: N/A  . Years of education: N/A   Social History Main Topics  . Smoking status: Former Research scientist (life sciences)  . Smokeless tobacco: Never Used  . Alcohol use Yes     Comment: SOCIALLY  . Drug use: Unknown  . Sexual activity: Not Asked     Comment: WIDOW   Other Topics Concern  . None   Social History Narrative  . None     Family History:  The patient's family history includes Arthritis in her father; Heart Problems in her father; Kidney disease in her mother; Lung disease in her brother.   ROS:   Please see the history of present illness.    Decreased memory, depression, back discomfort, occasional leg swelling, episode  of syncope undefined etiology.  All other systems reviewed and are negative.   PHYSICAL EXAM:   VS:  BP (!) 154/90 (BP Location: Right Arm)   Pulse 73   Ht 5' 4.5" (1.638 m)   Wt 135 lb (61.2 kg)   BMI 22.81 kg/m    GEN: Well nourished, well developed, in no acute distress  HEENT: normal  Neck: no JVD, carotid bruits, or masses Cardiac: crisp mechanical valve closure sounds are heard. 2/6 systolic murmur is heard.RRR; No rubs, or gallops,no edema  Respiratory:  clear to auscultation bilaterally, normal work of breathing GI: soft, nontender, nondistended, + BS MS: no deformity or atrophy  Skin: warm and dry, no rash Neuro:  Alert and Oriented x 3, Strength and sensation are intact Psych: euthymic mood, full affect  Wt Readings  from Last 3 Encounters:  09/05/17 135 lb (61.2 kg)      Studies/Labs Reviewed:   EKG:  EKG  Sinus rhythm, nonspecific ST-T wave abnormality, atrial premature contractions.  Recent Labs: No results found for requested labs within last 8760 hours.   Lipid Panel No results found for: CHOL, TRIG, HDL, CHOLHDL, VLDL, LDLCALC, LDLDIRECT  Additional studies/ records that were reviewed today include:  None recent echocardiogram performed February 2018 demonstrated ascending aorta measuring 3.6 cm, normally functioning mechanical aortic valve with trace aortic insufficiency. Concentric left ventricular hypertrophy, paradoxical septum motion, normal overall systolic function.    ASSESSMENT:    1. H/O mechanical aortic valve replacement   2. Chronic anticoagulation   3. Essential hypertension   4. Hyperlipidemia with target LDL less than 100   5. Premature atrial contractions   6. Obstructive sleep apnea   7. Syncope, unspecified syncope type      PLAN:  In order of problems listed above:  1. St. Jude aortic valve prosthesis with normal function by echo within the past 6 months. 2. INR is checked today. It is 2.4. No significant adjustment in therapy was made but she will be checked again in one week to make sure she is stable.enroll in the anticoagulation clinic. 3.  Blood pressure is slightly increased. Will monitor; prefer blood pressures less than 140/90. 4. LDL target less than 100 5. Not addressed 6. Continue to wear C Pap 7. No recurrence since episode last year.  Clinical observation. Follow-up in 4-6 months. Enroll in anticoagulation clinic.    Medication Adjustments/Labs and Tests Ordered: Current medicines are reviewed at length with the patient today.  Concerns regarding medicines are outlined above.  Medication changes, Labs and Tests ordered today are listed in the Patient Instructions below. There are no Patient Instructions on file for this visit.    Signed, Sinclair Grooms, MD  09/05/2017 4:28 PM    [facility] [address], [place], [place]  [address] Phone: [phone]; Fax: [phone]

## 2017-09-09 DIAGNOSIS — R69 Illness, unspecified: Secondary | ICD-10-CM | POA: Diagnosis not present

## 2017-09-13 ENCOUNTER — Ambulatory Visit (INDEPENDENT_AMBULATORY_CARE_PROVIDER_SITE_OTHER): Payer: Medicare HMO | Admitting: *Deleted

## 2017-09-13 DIAGNOSIS — Z5181 Encounter for therapeutic drug level monitoring: Secondary | ICD-10-CM

## 2017-09-13 DIAGNOSIS — Z7901 Long term (current) use of anticoagulants: Secondary | ICD-10-CM | POA: Diagnosis not present

## 2017-09-13 DIAGNOSIS — Z952 Presence of prosthetic heart valve: Secondary | ICD-10-CM

## 2017-09-13 LAB — POCT INR: INR: 3.5

## 2017-09-17 ENCOUNTER — Encounter: Payer: Self-pay | Admitting: Interventional Cardiology

## 2017-09-26 ENCOUNTER — Telehealth: Payer: Self-pay | Admitting: Interventional Cardiology

## 2017-09-26 ENCOUNTER — Ambulatory Visit (INDEPENDENT_AMBULATORY_CARE_PROVIDER_SITE_OTHER): Payer: Medicare HMO | Admitting: *Deleted

## 2017-09-26 DIAGNOSIS — Z5181 Encounter for therapeutic drug level monitoring: Secondary | ICD-10-CM

## 2017-09-26 DIAGNOSIS — Z7901 Long term (current) use of anticoagulants: Secondary | ICD-10-CM

## 2017-09-26 DIAGNOSIS — Z952 Presence of prosthetic heart valve: Secondary | ICD-10-CM

## 2017-09-26 LAB — POCT INR: INR: 2

## 2017-09-26 NOTE — Telephone Encounter (Signed)
New message     Pt asked for a call back from nurse about a handicap sticker for her car. Please call.

## 2017-09-26 NOTE — Patient Instructions (Signed)
Today when you get home take another 1/2 tablet (2.5mg ) then continue taking 1 tablet daily except 1.5 tablets on Tuesdays, Thursdays and Saturdays.  Recheck in one week. [facility] [phone] Main [phone]

## 2017-09-27 NOTE — Telephone Encounter (Signed)
Pt is here visiting her daughter, pt does not drive, is asking for a Handicap Placard for her for her daughter's car, advised I will forward to Dr Tamala Julian for review.

## 2017-09-28 NOTE — Telephone Encounter (Signed)
[place] with me. I will sign if they bring it to me.

## 2017-09-30 NOTE — Telephone Encounter (Signed)
Handicap placard form filled out. Pt aware and will pick up Wednesday at time of her [facility] appt.

## 2017-10-02 ENCOUNTER — Encounter (HOSPITAL_COMMUNITY): Payer: Self-pay | Admitting: Emergency Medicine

## 2017-10-02 ENCOUNTER — Ambulatory Visit (HOSPITAL_COMMUNITY)
Admission: EM | Admit: 2017-10-02 | Discharge: 2017-10-02 | Disposition: A | Discharge status: Home / Self Care | Payer: Medicare HMO | Attending: Family Medicine | Admitting: Family Medicine

## 2017-10-02 ENCOUNTER — Ambulatory Visit (INDEPENDENT_AMBULATORY_CARE_PROVIDER_SITE_OTHER): Payer: Medicare HMO | Admitting: *Deleted

## 2017-10-02 DIAGNOSIS — Z5181 Encounter for therapeutic drug level monitoring: Secondary | ICD-10-CM

## 2017-10-02 DIAGNOSIS — Z7901 Long term (current) use of anticoagulants: Secondary | ICD-10-CM

## 2017-10-02 DIAGNOSIS — L089 Local infection of the skin and subcutaneous tissue, unspecified: Secondary | ICD-10-CM

## 2017-10-02 DIAGNOSIS — Z952 Presence of prosthetic heart valve: Secondary | ICD-10-CM | POA: Diagnosis not present

## 2017-10-02 LAB — POCT INR: INR: 2.2

## 2017-10-02 MED ORDER — CLINDAMYCIN HCL 300 MG PO CAPS: 300.0000 mg | ORAL_CAPSULE | Freq: Three times a day (TID) | ORAL | 0 refills | Status: AC

## 2017-10-02 NOTE — Discharge Instructions (Signed)
Take clindamycin as directed. Can take tylenol for pain. Guaze on area to prevent pressure. Follow up for recheck INR in 3 days as antibiotic can increase INR level. Follow up with podiatry for further evaluation needed. If noticing worsening symptoms, spreading redness, increased warmth, blackening of the toe, follow up for reevaluation.

## 2017-10-02 NOTE — Patient Instructions (Signed)
Today when you get home take another 1/2 tablet (2.5mg ) then change dose to 1.5 tablet daily except 1 tablet on Monday, Wednesday, and Friday.  Recheck in one week. Coumadin Clinic [phone] Main [phone]

## 2017-10-02 NOTE — ED Triage Notes (Signed)
PT C/O: poss toe infection and pain .... Reports it might have been due to a recent pedicure.   ONSET: 3 weeks  SX INCLUDE: swelling and pain  DENIES: fevers  TAKING MEDS: none   A&O x4... NAD... Ambulatory

## 2017-10-02 NOTE — ED Provider Notes (Signed)
[facility]    CSN: [id number] Arrival date & time: 10/02/17  1226     History   Chief Complaint Chief Complaint  Patient presents with  . Toe Pain    HPI Virginia Franklin is a 83 y.o. female.   83 year old female with history of RA, OA, CVA, cervical cancer, heart valve replacement on coumadin comes in for 3 week history of toe pain after pedicure. States that she has corns on her toes due to RA and friction, some were removed during pedicure and has since had worsening pain. She has noticed swelling as well. Denies spreading erythema, increased warmth, discharge. Denies fever, chills, night sweats. Has been putting EtOH and antibiotic ointment without relief.       Past Medical History:  Diagnosis Date  . Body mass index (BMI) of 22.0-22.9 in adult   . Cervical cancer ([place])   . H/O cervical fracture   . H/O osteoporosis   . H/O rheumatoid arthritis   . H/O: CVA (cerebrovascular accident) 2012  . H/O: osteoarthritis   . History of urinary frequency   . Hx of diverticulitis of colon   . Hypertension     Patient Active Problem List   Diagnosis Date Noted  . Encounter for therapeutic drug monitoring 09/13/2017  . Chronic anticoagulation 09/05/2017  . Aortic valve replaced 09/05/2017    Past Surgical History:  Procedure Laterality Date  . ABDOMINAL HYSTERECTOMY    . CARDIAC SURGERY    . CATARACT EXTRACTION    . SMALL INTESTINE SURGERY      OB History    No data available       Home Medications    Prior to Admission medications   Medication Sig Start Date End Date Taking? Authorizing Provider  escitalopram (LEXAPRO) 5 MG tablet Take 5 mg by mouth daily.   Yes [provider]  ezetimibe-simvastatin (VYTORIN) 10-40 MG tablet Take 1 tablet by mouth daily.   Yes [provider]  furosemide (LASIX) 20 MG tablet Take 20 mg by mouth.   Yes [provider]  lisinopril (PRINIVIL,ZESTRIL) 10 MG tablet Take 10 mg by mouth  daily.   Yes [provider]  warfarin (COUMADIN) 5 MG tablet Take 5 mg by mouth daily.   Yes [provider]  warfarin (COUMADIN) 7.5 MG tablet Take 7.5 mg by mouth daily.   Yes [provider]  acetaminophen (TYLENOL) 650 MG CR tablet Take 650 mg by mouth every 8 (eight) hours as needed for pain.    [provider]  clindamycin (CLEOCIN) 300 MG capsule Take 1 capsule (300 mg total) by mouth 3 (three) times daily for 7 days. 10/02/17 10/09/17  Ok Edwards, PA-C  Turmeric, Curcuma Longa, ([place]) POWD by Does not apply route.    [provider]    Family History Family History  Problem Relation Age of Onset  . Kidney disease Mother        MALIGNANT NEOPLASM  . Arthritis Father   . Heart Problems Father   . Lung disease Brother     Social History Social History   Tobacco Use  . Smoking status: Former Research scientist (life sciences)  . Smokeless tobacco: Never Used  Substance Use Topics  . Alcohol use: Yes    Comment: SOCIALLY  . Drug use: Not on file     Allergies   Penicillins   Review of Systems Review of Systems  Reason unable to perform ROS: See HPI as above.  Physical Exam Triage Vital Signs ED Triage Vitals  Enc Vitals Group     BP 10/02/17 1247 (!) 162/59     Pulse Rate 10/02/17 1247 65     Resp 10/02/17 1247 18     Temp 10/02/17 1247 97.8 F (36.6 C)     Temp Source 10/02/17 1247 Oral     SpO2 10/02/17 1247 98 %     Weight --      Height --      Head Circumference --      Peak Flow --      Pain Score 10/02/17 1250 8     Pain Loc --      Pain Edu? --      Excl. in [place]? --    No data found.  Updated Vital Signs BP (!) 162/59 (BP Location: Left Arm)   Pulse 65   Temp 97.8 F (36.6 C) (Oral)   Resp 18   SpO2 98%   Physical Exam  Constitutional: She is oriented to person, place, and time. She appears well-developed and well-nourished. No distress.  HENT:  Head: Normocephalic and atraumatic.  Eyes: Conjunctivae are  normal. Pupils are equal, round, and reactive to light.  Musculoskeletal:  See pictures below. Swelling with discoloration of the toe. Possible hematoma as patient states color has not changed since pedicure. No obvious erythema, increased warmth. Tender on palpation.   Neurological: She is alert and oriented to person, place, and time.           UC Treatments / Results  Labs (all labs ordered are listed, but only abnormal results are displayed) Labs Reviewed - No data to display  EKG  EKG Interpretation None       Radiology No results found.  Procedures Procedures (including critical care time)  Medications Ordered in UC Medications - No data to display   Initial Impression / Assessment and Plan / UC Course  I have reviewed the triage vital signs and the nursing notes.  Pertinent labs & imaging results that were available during my care of the patient were reviewed by me and considered in my medical decision making (see chart for details).    Patient with anaphylactic reaction to PCN, will avoid keflex. Start clindamycin as directed. Patient to recheck INR in 3 days. Resources for podiatry provided. Return precautions given.  Case discussed with Dr Valere Dross, who agrees to plan.   Final Clinical Impressions(s) / UC Diagnoses   Final diagnoses:  Toe infection    ED Discharge Orders        Ordered    clindamycin (CLEOCIN) 300 MG capsule  3 times daily     10/02/17 1425        Ok Edwards, [place] 10/02/17 1445

## 2017-10-10 ENCOUNTER — Ambulatory Visit (INDEPENDENT_AMBULATORY_CARE_PROVIDER_SITE_OTHER): Payer: Medicare HMO | Admitting: *Deleted

## 2017-10-10 DIAGNOSIS — Z5181 Encounter for therapeutic drug level monitoring: Secondary | ICD-10-CM | POA: Diagnosis not present

## 2017-10-10 DIAGNOSIS — Z952 Presence of prosthetic heart valve: Secondary | ICD-10-CM

## 2017-10-10 DIAGNOSIS — Z7901 Long term (current) use of anticoagulants: Secondary | ICD-10-CM | POA: Diagnosis not present

## 2017-10-10 LAB — POCT INR: INR: 2.6

## 2017-10-10 NOTE — Patient Instructions (Signed)
Continue same  dose of coumadin 1.5 tablets daily except 1 tablet on Monday, Wednesday, and Friday.  Recheck in 2 weeks. Cou[facility]in Clinic [phone] Main [phone]

## 2017-10-15 ENCOUNTER — Encounter: Payer: Self-pay | Admitting: Family Medicine

## 2017-10-15 ENCOUNTER — Ambulatory Visit (INDEPENDENT_AMBULATORY_CARE_PROVIDER_SITE_OTHER): Payer: Medicare HMO | Admitting: Family Medicine

## 2017-10-15 VITALS — BP 130/60 | HR 74 | Temp 98.0°F | Ht 64.0 in | Wt 143.8 lb

## 2017-10-15 DIAGNOSIS — Z7901 Long term (current) use of anticoagulants: Secondary | ICD-10-CM | POA: Diagnosis not present

## 2017-10-15 DIAGNOSIS — I10 Essential (primary) hypertension: Secondary | ICD-10-CM | POA: Insufficient documentation

## 2017-10-15 DIAGNOSIS — M159 Polyosteoarthritis, unspecified: Secondary | ICD-10-CM

## 2017-10-15 DIAGNOSIS — Z952 Presence of prosthetic heart valve: Secondary | ICD-10-CM | POA: Diagnosis not present

## 2017-10-15 DIAGNOSIS — M150 Primary generalized (osteo)arthritis: Secondary | ICD-10-CM | POA: Diagnosis not present

## 2017-10-15 DIAGNOSIS — E785 Hyperlipidemia, unspecified: Secondary | ICD-10-CM | POA: Diagnosis not present

## 2017-10-15 DIAGNOSIS — Z8673 Personal history of transient ischemic attack (TIA), and cerebral infarction without residual deficits: Secondary | ICD-10-CM | POA: Diagnosis not present

## 2017-10-15 DIAGNOSIS — M1990 Unspecified osteoarthritis, unspecified site: Secondary | ICD-10-CM | POA: Insufficient documentation

## 2017-10-15 NOTE — Progress Notes (Signed)
   Subjective:    Patient ID: Virginia Franklin, female    DOB: 1929-08-01, 83 y.o.   MRN: [id number]  HPI 83 yr old female to establish with [place]. She moved to [place] one month ago from [place], [place] to be near her daughter. She lives in a retirement community. Her main complaint is arthritis pain, particularly in both knees and both both feet. This sounds like osteoarthritis. She takes glucosamine with chondroitin sulfate and turmeric every day. Since she is on permanent anticoagulation she cannot take NSAIDs. She had rheumatic heart disease as a child and she has a St. Jude mechanical aortic valve. She sees Dr. Daneen Schick for cardiology care. She gets regular INR checks through [facility] cardiology. An ECHO shows normal systolic function.    Review of Systems  Constitutional: Negative.   Respiratory: Negative.   Cardiovascular: Negative.   Musculoskeletal: Positive for arthralgias.  Neurological: Negative.        Objective:   Physical Exam  Constitutional: She is oriented to person, place, and time. She appears well-developed and well-nourished.  Cardiovascular: Normal rate, regular rhythm and intact distal pulses.  Early systolic clicks   Pulmonary/Chest: Effort normal and breath sounds normal. No respiratory distress. She has no wheezes. She has no rales.  Musculoskeletal:  Trace edema in both ankles. Both hands show swelling and tenderness in all PIPs and DIPs, less so in the MCP joints. The wrists are normal.   Neurological: She is alert and oriented to person, place, and time.          Assessment & Plan:  Intro visit with this patient who recently moved to the area. She has osteoarthritis and I suggested she take ES Tylenol as needed for pain. Staying active helps and I advised her to enroll in some exercise classes. She sees cardiology for a hx of AV replacement and she gets regular Coumadin checks. She has a hx of a stroke, but apparently there are no residual deficits. Her HTN  is stable. We will investigate her medical records to see when she last had a lipid panel checked, etc.  Alysia Penna, MD

## 2017-10-25 ENCOUNTER — Other Ambulatory Visit: Payer: Self-pay

## 2017-10-25 MED ORDER — LISINOPRIL 10 MG PO TABS: 10.0000 mg | ORAL_TABLET | Freq: Every day | ORAL | 0 refills | Status: DC

## 2017-10-25 MED ORDER — WARFARIN SODIUM 5 MG PO TABS: 5.0000 mg | ORAL_TABLET | Freq: Every day | ORAL | 0 refills | Status: DC

## 2017-10-25 MED ORDER — ESCITALOPRAM OXALATE 5 MG PO TABS: 5.0000 mg | ORAL_TABLET | Freq: Every day | ORAL | 3 refills | Status: DC

## 2017-10-25 MED ORDER — FUROSEMIDE 20 MG PO TABS: 20.0000 mg | ORAL_TABLET | Freq: Every day | ORAL | 0 refills | Status: DC

## 2017-10-25 MED ORDER — EZETIMIBE-SIMVASTATIN 10-40 MG PO TABS: 1.0000 | ORAL_TABLET | Freq: Every day | ORAL | 0 refills | Status: DC

## 2017-10-25 MED ORDER — WARFARIN SODIUM 7.5 MG PO TABS: 7.5000 mg | ORAL_TABLET | Freq: Every day | ORAL | 0 refills | Status: DC

## 2017-10-25 NOTE — Telephone Encounter (Signed)
RF the following medications 90 day supply [place] [facility]. Sent to PCP   Warfarin tabs 5 MG  Ezetimibe/simvast 10/40 tabs  escitalopram tabs 5 Mg  Lisinopril tabs 20 MG

## 2017-10-28 ENCOUNTER — Ambulatory Visit (INDEPENDENT_AMBULATORY_CARE_PROVIDER_SITE_OTHER): Payer: Medicare HMO | Admitting: Pharmacist

## 2017-10-28 DIAGNOSIS — Z7901 Long term (current) use of anticoagulants: Secondary | ICD-10-CM

## 2017-10-28 DIAGNOSIS — Z5181 Encounter for therapeutic drug level monitoring: Secondary | ICD-10-CM

## 2017-10-28 DIAGNOSIS — Z952 Presence of prosthetic heart valve: Secondary | ICD-10-CM | POA: Diagnosis not present

## 2017-10-28 LAB — POCT INR: INR: 3

## 2017-10-28 NOTE — Patient Instructions (Signed)
Description   Continue same  dose of coumadin 1.5 tablets daily except 1 tablet on Monday, Wednesday, and Friday.  Recheck in 4 weeks. [facility]in Clinic [phone] Main [phone]

## 2017-11-25 ENCOUNTER — Ambulatory Visit (INDEPENDENT_AMBULATORY_CARE_PROVIDER_SITE_OTHER): Payer: Medicare HMO | Admitting: Pharmacist

## 2017-11-25 DIAGNOSIS — Z5181 Encounter for therapeutic drug level monitoring: Secondary | ICD-10-CM | POA: Diagnosis not present

## 2017-11-25 DIAGNOSIS — Z7901 Long term (current) use of anticoagulants: Secondary | ICD-10-CM | POA: Diagnosis not present

## 2017-11-25 DIAGNOSIS — Z952 Presence of prosthetic heart valve: Secondary | ICD-10-CM

## 2017-11-25 LAB — POCT INR: INR: 2.3

## 2017-11-25 NOTE — Patient Instructions (Signed)
Description   Take an extra 2.5mg  today, then continue same dose of coumadin 7.5mg  daily daily except 5mg  on Monday, Wednesday, and Friday.  Recheck in 3 weeks. Coumadin Clinic [phone] Main [phone]

## 2017-12-04 ENCOUNTER — Encounter: Payer: Self-pay | Admitting: Podiatry

## 2017-12-04 ENCOUNTER — Other Ambulatory Visit: Payer: Self-pay | Admitting: Podiatry

## 2017-12-04 ENCOUNTER — Ambulatory Visit (INDEPENDENT_AMBULATORY_CARE_PROVIDER_SITE_OTHER): Payer: Medicare HMO | Admitting: Podiatry

## 2017-12-04 ENCOUNTER — Ambulatory Visit (INDEPENDENT_AMBULATORY_CARE_PROVIDER_SITE_OTHER): Payer: Medicare HMO

## 2017-12-04 DIAGNOSIS — M79674 Pain in right toe(s): Secondary | ICD-10-CM | POA: Diagnosis not present

## 2017-12-04 DIAGNOSIS — L84 Corns and callosities: Secondary | ICD-10-CM

## 2017-12-04 DIAGNOSIS — M7750 Other enthesopathy of unspecified foot: Secondary | ICD-10-CM

## 2017-12-04 DIAGNOSIS — M2041 Other hammer toe(s) (acquired), right foot: Secondary | ICD-10-CM | POA: Diagnosis not present

## 2017-12-04 DIAGNOSIS — M79675 Pain in left toe(s): Secondary | ICD-10-CM | POA: Diagnosis not present

## 2017-12-04 DIAGNOSIS — L600 Ingrowing nail: Secondary | ICD-10-CM

## 2017-12-04 NOTE — Progress Notes (Signed)
Subjective:   Patient ID: Virginia Franklin, female   DOB: 84 y.o.   MRN: [id number]   HPI Patient presents stating she is had a very painful right foot with corn callus formation is making it hard for her to walk.  She is tried to trim it herself without relief and tried soaks and padding patient states she does not smoke and would like to be more active   Review of Systems  All other systems reviewed and are negative.       Objective:  Physical Exam  Constitutional: She appears well-developed and well-nourished.  Cardiovascular: Intact distal pulses.  Pulmonary/Chest: Effort normal.  Musculoskeletal: Normal range of motion.  Neurological: She is alert.  Skin: Skin is warm.  Nursing note and vitals reviewed.   Neurovascular status was found to be intact with patient noted to have significant distal keratotic lesion digit 3 right that is painful when pressed and make shoe gear difficult.  Patient does also have digital deformities consistent with hammertoe-like condition and does have good digital perfusion and is well oriented x3     Assessment:  Chronic lesion secondary to position of the toe with no current breakdown of skin     Plan:  H&P and x-ray reviewed and today I did sterile debridement of the area with no iatrogenic bleeding applied buttress pad to lift the toe and discussed possible hammertoe surgery educating her on this  X-rays indicate that there is some depression of the third toe right with keratotic lesion formation that is moderately painful when pressed

## 2017-12-16 ENCOUNTER — Encounter (HOSPITAL_COMMUNITY): Payer: Self-pay | Admitting: Emergency Medicine

## 2017-12-16 ENCOUNTER — Observation Stay (HOSPITAL_COMMUNITY): Payer: Medicare HMO

## 2017-12-16 ENCOUNTER — Other Ambulatory Visit: Payer: Self-pay

## 2017-12-16 ENCOUNTER — Emergency Department (HOSPITAL_COMMUNITY): Payer: Medicare HMO

## 2017-12-16 ENCOUNTER — Ambulatory Visit (INDEPENDENT_AMBULATORY_CARE_PROVIDER_SITE_OTHER): Payer: Medicare HMO | Admitting: Pharmacist

## 2017-12-16 ENCOUNTER — Observation Stay (HOSPITAL_COMMUNITY)
Admission: EM | Admit: 2017-12-16 | Discharge: 2017-12-17 | Disposition: A | Discharge status: Home / Self Care | Payer: Medicare HMO | Attending: Internal Medicine | Admitting: Internal Medicine

## 2017-12-16 ENCOUNTER — Telehealth: Payer: Self-pay | Admitting: Pharmacist

## 2017-12-16 DIAGNOSIS — I10 Essential (primary) hypertension: Secondary | ICD-10-CM | POA: Diagnosis not present

## 2017-12-16 DIAGNOSIS — Z79899 Other long term (current) drug therapy: Secondary | ICD-10-CM | POA: Insufficient documentation

## 2017-12-16 DIAGNOSIS — R197 Diarrhea, unspecified: Secondary | ICD-10-CM | POA: Diagnosis not present

## 2017-12-16 DIAGNOSIS — Z8541 Personal history of malignant neoplasm of cervix uteri: Secondary | ICD-10-CM | POA: Diagnosis not present

## 2017-12-16 DIAGNOSIS — Z952 Presence of prosthetic heart valve: Secondary | ICD-10-CM | POA: Diagnosis not present

## 2017-12-16 DIAGNOSIS — Z8673 Personal history of transient ischemic attack (TIA), and cerebral infarction without residual deficits: Secondary | ICD-10-CM | POA: Diagnosis not present

## 2017-12-16 DIAGNOSIS — Z5181 Encounter for therapeutic drug level monitoring: Secondary | ICD-10-CM

## 2017-12-16 DIAGNOSIS — E785 Hyperlipidemia, unspecified: Secondary | ICD-10-CM | POA: Diagnosis present

## 2017-12-16 DIAGNOSIS — Z87891 Personal history of nicotine dependence: Secondary | ICD-10-CM | POA: Insufficient documentation

## 2017-12-16 DIAGNOSIS — K5900 Constipation, unspecified: Secondary | ICD-10-CM | POA: Diagnosis not present

## 2017-12-16 DIAGNOSIS — Z7901 Long term (current) use of anticoagulants: Secondary | ICD-10-CM

## 2017-12-16 DIAGNOSIS — R030 Elevated blood-pressure reading, without diagnosis of hypertension: Secondary | ICD-10-CM | POA: Diagnosis not present

## 2017-12-16 DIAGNOSIS — R4789 Other speech disturbances: Secondary | ICD-10-CM | POA: Diagnosis not present

## 2017-12-16 DIAGNOSIS — R4182 Altered mental status, unspecified: Secondary | ICD-10-CM | POA: Diagnosis not present

## 2017-12-16 DIAGNOSIS — R404 Transient alteration of awareness: Secondary | ICD-10-CM

## 2017-12-16 DIAGNOSIS — R2681 Unsteadiness on feet: Secondary | ICD-10-CM | POA: Insufficient documentation

## 2017-12-16 DIAGNOSIS — R410 Disorientation, unspecified: Secondary | ICD-10-CM | POA: Diagnosis not present

## 2017-12-16 DIAGNOSIS — R4020 Unspecified coma: Secondary | ICD-10-CM | POA: Diagnosis not present

## 2017-12-16 LAB — URINALYSIS, ROUTINE W REFLEX MICROSCOPIC
BILIRUBIN URINE: NEGATIVE
Glucose, UA: NEGATIVE mg/dL
HGB URINE DIPSTICK: NEGATIVE
KETONES UR: NEGATIVE mg/dL
Leukocytes, UA: NEGATIVE
Nitrite: NEGATIVE
PH: 9 — AB (ref 5.0–8.0)
Protein, ur: NEGATIVE mg/dL
SPECIFIC GRAVITY, URINE: 1.01 (ref 1.005–1.030)

## 2017-12-16 LAB — CBC WITH DIFFERENTIAL/PLATELET
BASOS PCT: 0 %
Basophils Absolute: 0 10*3/uL (ref 0.0–0.1)
Eosinophils Absolute: 0.1 10*3/uL (ref 0.0–0.7)
Eosinophils Relative: 1 %
HEMATOCRIT: 40.3 % (ref 36.0–46.0)
Hemoglobin: 13.3 g/dL (ref 12.0–15.0)
LYMPHS ABS: 1.1 10*3/uL (ref 0.7–4.0)
Lymphocytes Relative: 22 %
MCH: 30.1 pg (ref 26.0–34.0)
MCHC: 33 g/dL (ref 30.0–36.0)
MCV: 91.2 fL (ref 78.0–100.0)
MONO ABS: 0.3 10*3/uL (ref 0.1–1.0)
MONOS PCT: 7 %
NEUTROS ABS: 3.3 10*3/uL (ref 1.7–7.7)
Neutrophils Relative %: 70 %
Platelets: 213 10*3/uL (ref 150–400)
RBC: 4.42 MIL/uL (ref 3.87–5.11)
RDW: 13.8 % (ref 11.5–15.5)
WBC: 4.8 10*3/uL (ref 4.0–10.5)

## 2017-12-16 LAB — COMPREHENSIVE METABOLIC PANEL
ALBUMIN: 3.8 g/dL (ref 3.5–5.0)
ALK PHOS: 62 U/L (ref 38–126)
ALT: 12 U/L — ABNORMAL LOW (ref 14–54)
ANION GAP: 12 (ref 5–15)
AST: 20 U/L (ref 15–41)
BILIRUBIN TOTAL: 0.9 mg/dL (ref 0.3–1.2)
BUN: 14 mg/dL (ref 6–20)
CALCIUM: 9.5 mg/dL (ref 8.9–10.3)
CO2: 23 mmol/L (ref 22–32)
Chloride: 100 mmol/L — ABNORMAL LOW (ref 101–111)
Creatinine, Ser: 0.76 mg/dL (ref 0.44–1.00)
GLUCOSE: 97 mg/dL (ref 65–99)
POTASSIUM: 3.8 mmol/L (ref 3.5–5.1)
Sodium: 135 mmol/L (ref 135–145)
TOTAL PROTEIN: 7.1 g/dL (ref 6.5–8.1)

## 2017-12-16 LAB — MAGNESIUM: MAGNESIUM: 2 mg/dL (ref 1.7–2.4)

## 2017-12-16 LAB — PROTIME-INR
INR: 1.91
PROTHROMBIN TIME: 21.7 s — AB (ref 11.4–15.2)

## 2017-12-16 LAB — POCT INR: INR: 2

## 2017-12-16 LAB — I-STAT TROPONIN, ED: TROPONIN I, POC: 0.01 ng/mL (ref 0.00–0.08)

## 2017-12-16 MED ORDER — SENNOSIDES-DOCUSATE SODIUM 8.6-50 MG PO TABS: 1.0000 | ORAL_TABLET | Freq: Every evening | ORAL | Status: DC | PRN

## 2017-12-16 MED ORDER — ACETAMINOPHEN 650 MG RE SUPP: 650.0000 mg | RECTAL | Status: DC | PRN

## 2017-12-16 MED ORDER — FUROSEMIDE 20 MG PO TABS
20.0000 mg | ORAL_TABLET | Freq: Every day | ORAL | Status: DC
  Administered 2017-12-17: 20 mg via ORAL
  Filled 2017-12-16: qty 1

## 2017-12-16 MED ORDER — ACETAMINOPHEN 160 MG/5ML PO SOLN: 650.0000 mg | ORAL | Status: DC | PRN

## 2017-12-16 MED ORDER — SODIUM CHLORIDE 0.9 % IV SOLN
INTRAVENOUS | Status: DC
  Administered 2017-12-16 – 2017-12-17 (×2): via INTRAVENOUS

## 2017-12-16 MED ORDER — WARFARIN SODIUM 7.5 MG PO TABS
7.5000 mg | ORAL_TABLET | Freq: Once | ORAL | Status: DC
  Filled 2017-12-16: qty 1

## 2017-12-16 MED ORDER — EZETIMIBE-SIMVASTATIN 10-40 MG PO TABS
1.0000 | ORAL_TABLET | Freq: Every day | ORAL | Status: DC
  Administered 2017-12-17: 1 via ORAL
  Filled 2017-12-16: qty 1

## 2017-12-16 MED ORDER — STROKE: EARLY STAGES OF RECOVERY BOOK: Freq: Once | Status: DC

## 2017-12-16 MED ORDER — ACETAMINOPHEN 325 MG PO TABS: 650.0000 mg | ORAL_TABLET | ORAL | Status: DC | PRN

## 2017-12-16 MED ORDER — ESCITALOPRAM OXALATE 10 MG PO TABS
5.0000 mg | ORAL_TABLET | Freq: Every day | ORAL | Status: DC
  Administered 2017-12-17: 5 mg via ORAL
  Filled 2017-12-16: qty 1

## 2017-12-16 MED ORDER — WARFARIN - PHARMACIST DOSING INPATIENT: Freq: Every day | Status: DC

## 2017-12-16 NOTE — ED Provider Notes (Signed)
[facility] Provider Note   CSN: [id number] Arrival date & time: 12/16/17  1300     History   Chief Complaint Chief Complaint  Patient presents with  . Altered Mental Status    HPI Virginia Franklin is a 84 y.o. female.  The history is provided by the patient, a relative and medical records.  Neurologic Problem  This is a new problem. The current episode started 1 to 2 hours ago. The problem occurs rarely. The problem has been resolved. Pertinent negatives include no chest pain, no abdominal pain, no headaches and no shortness of breath. Nothing aggravates the symptoms. Nothing relieves the symptoms. She has tried nothing for the symptoms. The treatment provided no relief.    Past Medical History:  Diagnosis Date  . Body mass index (BMI) of 22.0-22.9 in adult   . Cervical cancer ([place])   . H/O cervical fracture   . H/O osteoporosis   . H/O rheumatoid arthritis   . H/O: CVA (cerebrovascular accident) 2012  . H/O: osteoarthritis   . History of urinary frequency   . Hx of diverticulitis of colon   . Hypertension     Patient Active Problem List   Diagnosis Date Noted  . HTN (hypertension) 10/15/2017  . Dyslipidemia 10/15/2017  . Hx of completed stroke 10/15/2017  . Osteoarthritis 10/15/2017  . Encounter for therapeutic drug monitoring 09/13/2017  . Chronic anticoagulation 09/05/2017  . Aortic valve replaced 09/05/2017    Past Surgical History:  Procedure Laterality Date  . ABDOMINAL HYSTERECTOMY    . CARDIAC SURGERY    . CATARACT EXTRACTION    . SMALL INTESTINE SURGERY      OB History    No data available       Home Medications    Prior to Admission medications   Medication Sig Start Date End Date Taking? Authorizing Provider  acetaminophen (TYLENOL) 650 MG CR tablet Take 650 mg by mouth every 8 (eight) hours as needed for pain.    [provider]  escitalopram (LEXAPRO) 5 MG tablet Take 1 tablet (5 mg  total) by mouth daily. 10/25/17   [name], MD  ezetimibe-simvastatin (VYTORIN) 10-40 MG tablet Take 1 tablet by mouth daily. 10/25/17   [name], MD  furosemide (LASIX) 20 MG tablet Take 1 tablet (20 mg total) by mouth daily. 10/25/17   [name], MD  lisinopril (PRINIVIL,ZESTRIL) 10 MG tablet Take 1 tablet (10 mg total) by mouth daily. 10/25/17   [name], MD  Turmeric, Curcuma [place]a, ([place]) POWD by Does not apply route.    [provider]  warfarin (COUMADIN) 5 MG tablet Take 1 tablet (5 mg total) by mouth daily. 10/25/17   [name], MD  warfarin (COUMADIN) 7.5 MG tablet Take 1 tablet (7.5 mg total) by mouth daily. Take as directed at by coumadin clinic 10/25/17   [name], MD    Family History Family History  Problem Relation Age of Onset  . Kidney disease Mother        MALIGNANT NEOPLASM  . Arthritis Father   . Heart Problems Father   . Lung disease Brother     Social History Social History   Tobacco Use  . Smoking status: Former Research scientist (life sciences)  . Smokeless tobacco: Never Used  Substance Use Topics  . Alcohol use: Yes    Comment: SOCIALLY  . Drug use: Not on file     Allergies  Penicillins   Review of Systems Review of Systems  Constitutional: Negative for chills, diaphoresis, fatigue and fever.  HENT: Negative for congestion.   Eyes: Negative for photophobia and visual disturbance.  Respiratory: Negative for choking, chest tightness and shortness of breath.   Cardiovascular: Negative for chest pain.  Gastrointestinal: Positive for constipation and diarrhea. Negative for abdominal pain, nausea and vomiting.  Genitourinary: Negative for dysuria and flank pain.  Musculoskeletal: Negative for back pain, neck pain and neck stiffness.  Skin: Negative for rash and wound.  Neurological: Negative for dizziness, seizures, speech difficulty, weakness, light-headedness, numbness and headaches.  Psychiatric/Behavioral: Positive  for confusion. Negative for agitation.  All other systems reviewed and are negative.    Physical Exam Updated Vital Signs Pulse 68   Temp 98.4 F (36.9 C) (Oral)   Resp 18   Ht 5\' 4"  (1.626 m)   Wt 64.9 kg (143 lb)   SpO2 96%   BMI 24.55 kg/m   Physical Exam  Constitutional: She is oriented to person, place, and time. She appears well-developed and well-nourished. No distress.  HENT:  Head: Normocephalic and atraumatic.  Mouth/Throat: Oropharynx is clear and moist. No oropharyngeal exudate.  Eyes: Conjunctivae and EOM are normal. Pupils are equal, round, and reactive to light.  Neck: Normal range of motion.  Cardiovascular: Normal rate and intact distal pulses.  Murmur heard. Pulmonary/Chest: Effort normal. No stridor. No respiratory distress. She has no wheezes. She exhibits no tenderness.  Abdominal: She exhibits no distension. There is no tenderness.  Musculoskeletal: She exhibits no edema or tenderness.  Neurological: She is alert and oriented to person, place, and time. She is not disoriented. She displays no tremor and normal reflexes. No cranial nerve deficit or sensory deficit. She exhibits normal muscle tone. Coordination normal. GCS eye subscore is 4. GCS verbal subscore is 5. GCS motor subscore is 6.  Skin: Capillary refill takes less than 2 seconds. No rash noted. She is not diaphoretic. No erythema.  Psychiatric: She has a normal mood and affect.  Nursing note and vitals reviewed.    ED Treatments / Results  Labs (all labs ordered are listed, but only abnormal results are displayed) Labs Reviewed  COMPREHENSIVE METABOLIC PANEL - Abnormal; Notable for the following components:      Result Value   Chloride 100 (*)    ALT 12 (*)    All other components within normal limits  PROTIME-INR - Abnormal; Notable for the following components:   Prothrombin Time 21.7 (*)    All other components within normal limits  URINALYSIS, ROUTINE W REFLEX MICROSCOPIC - Abnormal;  Notable for the following components:   APPearance CLOUDY (*)    pH 9.0 (*)    All other components within normal limits  URINE CULTURE  CBC WITH DIFFERENTIAL/PLATELET  MAGNESIUM  HEMOGLOBIN A1C  LIPID PANEL  CBC WITH DIFFERENTIAL/PLATELET  COMPREHENSIVE METABOLIC PANEL  MAGNESIUM  PROTIME-INR  TSH  VITAMIN B12  I-STAT TROPONIN, ED    EKG  EKG Interpretation  Date/Time:  Monday December 16 2017 13:23:59 EST Ventricular Rate:  68 PR Interval:    QRS Duration: 100 QT Interval:  413 QTC Calculation: 440 R Axis:   53 Text Interpretation:  Sinus rhythm Prolonged PR interval No prior ECG for comparison.  No STEMI Confirmed by Antony Blackbird [phone]) on 12/16/2017 1:47:12 PM       Radiology Dg Chest 2 View  Result Date: 12/16/2017 CLINICAL DATA:  Altered mental status. EXAM: CHEST  2 VIEW COMPARISON:  None. FINDINGS: Prior median sternotomy. The heart size and mediastinal contours are within normal limits. Atherosclerotic calcification of the aortic arch. Normal pulmonary vascularity. No focal consolidation, pleural effusion, or pneumothorax. No acute osseous abnormality. IMPRESSION: No active cardiopulmonary disease. Electronically Signed   By: Titus Dubin M.D.   On: 12/16/2017 15:14   Ct Head Wo Contrast  Result Date: 12/16/2017 CLINICAL DATA:  Witnessed episode of unresponsiveness, 45 seconds in duration. EXAM: CT HEAD WITHOUT CONTRAST TECHNIQUE: Contiguous axial images were obtained from the base of the skull through the vertex without intravenous contrast. COMPARISON:  None. FINDINGS: Brain: Generalized age related atrophy. Chronic small-vessel ischemic changes of the hemispheric white matter. No sign of acute infarction, mass lesion, hemorrhage, hydrocephalus or extra-axial collection. No cortical or large vessel territory insult. Vascular: There is atherosclerotic calcification of the major vessels at the base of the brain. Skull: Normal Sinuses/Orbits: Clear/normal Other: None  IMPRESSION: No acute finding by CT. Age related atrophy and chronic small-vessel ischemic changes. Atherosclerotic calcification of the major vessels at the base of the brain. Electronically Signed   By: Nelson Chimes M.D.   On: 12/16/2017 15:02    Procedures Procedures (including critical care time)  Medications Ordered in ED Medications  escitalopram (LEXAPRO) tablet 5 mg (not administered)  ezetimibe-simvastatin (VYTORIN) 10-40 MG per tablet 1 tablet (not administered)  furosemide (LASIX) tablet 20 mg (not administered)   stroke: mapping our early stages of recovery book (not administered)  acetaminophen (TYLENOL) tablet 650 mg (not administered)    Or  acetaminophen (TYLENOL) solution 650 mg (not administered)    Or  acetaminophen (TYLENOL) suppository 650 mg (not administered)  senna-docusate (Senokot-S) tablet 1 tablet (not administered)  0.9 %  sodium chloride infusion (not administered)  warfarin (COUMADIN) tablet 7.5 mg (7.5 mg Oral Not Given 12/16/17 1815)  Warfarin - Pharmacist Dosing Inpatient (not administered)     Initial Impression / Assessment and Plan / ED Course  I have reviewed the triage vital signs and the nursing notes.  Pertinent labs & imaging results that were available during my care of the patient were reviewed by me and considered in my medical decision making (see chart for details).     Yamina Lenis Matsushima is a 84 y.o. female with a past medical history significant for mechanical aortic valve replacement on Coumadin therapy, hypertension, dyslipidemia, and prior stroke who presents from her Coumadin clinic visit for altered mental status episode.  Patient reports that several days ago she had an episode that lasted several hours where she could not remember different things in her house.  She reports that it resolved.  She reports that today while at her Coumadin clinic visit, she "zoned out and would not talk to anyone" for approximately 45 seconds.  She  subsequently had improvement in mental status and does not member the episode.  According to daughter who heard about the episode, patient did not have any focal seizure-like activity or shaking and did not lose bowel or bladder control.  Patient has a history of strokes but does not have any residual symptoms.  Patient denies recent fevers, chills, chest pain, shortness of breath, palpitations, vomiting, vision changes, or urinary changes.  She does report having intermittent episodes of constipation and diarrhea chronically.  She does report she had a dry cough several days ago but has improved.  She currently denies any symptoms on arrival.  On exam, patient had no focal neurologic deficits.  Finger-nose-finger was intact bilaterally.  No evidence  of facial droop.  Patient was alert and oriented.  Normal sensation and strength in all extremities.  Normal pulses in upper and lower extremities.  Patient's lungs were clear and chest was nontender.  Abdomen nontender.  Normal pupil exam.  Based on patient's symptoms I am concerned about seizure versus TIA causing her episode.  Also consider arrhythmia given the patient's reported history of electrolyte abnormalities.  Patient was found to be subtherapeutic on Coumadin at 1.91.  CT of the head showed evidence of old stroke but no acute abnormality.  Chest x-ray shows no pneumonia.  Patient laboratory testing seen above.  Metabolic panel reassuring.  CBC shows no leukocytosis or anemia.  Urinalysis shows no infection.  Electrolytes were reassuring with magnesium sodium and potassium.  Slight hyperchloremia.  Normal kidney function and liver function.  As there does not appear to be occult infection or significant I abnormality, I am still concerned about a neurologic etiology of symptoms.  As she is subtherapeutic, neurology will be called for evaluation and recommendations.   3:38 PM Neurology agreed with concern for seizure as etiology of symptoms  however given the recent episode of altered mental status and confusion, they felt patient needs admission overnight for EEG as well as MRI.  They will come see the patient.    Hospitalist team will be called for admission.   Final Clinical Impressions(s) / ED Diagnoses   Final diagnoses:  Transient alteration of awareness     Clinical Impression: 1. Transient alteration of awareness     Disposition: Admit  This note was prepared with assistance of Dragon voice recognition software. Occasional wrong-word or sound-a-like substitutions may have occurred due to the inherent limitations of voice recognition software.     Tegeler, Gwenyth Allegra, MD 12/16/17 2029

## 2017-12-16 NOTE — Consult Note (Signed)
Neurology Consultation Reason for Consult: Seizures Referring Physician: ED Provider  CC: Concern for seizure activity  History is obtained from: Patient, daughter, and chart  HPI: Virginia Franklin is a 84 y.o. female with PMHx of mechanical aortic valve on anticoagulation, HTN, HLD, and prior CVA who presents from clinic due to acute encephalopathy.  Patient reports a few days ago having an episode where she was unable to recall different things in her house.  She reports it lasted a few hours then resolved.  She reports no prior history of similar episodes.  Today at her coumadin clinic visit, after having INR checked she had an episode for about 45 seconds where she zoned out and would not talk.  This improved but she does not recall the episode.  There was no report of focal seizure like activity, no loss of bowel or bladder control.  Her history of strokes has not resulted in any residual symptoms.  She denies recent illness, fever, chills, chest pain, SOB, palpitations, nausea, vomiting, visual disturbance, urinary symptoms.  Her daughter is present in the room and reports she is still not completely back to baseline.  She also reports that over the last 2 years, patient has had about 5 episodes of confusion and inability to recall events.  She was evaluated for memory and cognitive problems in [place].  Work up evidently showed electrolyte abnormalities and evidence of old prior strokes.  It sounds like her memory testing was reassuring at the time. She was living on her own in [place] up until October when her family moved her out to [place]. She has been living at a retirement community since that time.  In the ED, there was concern of TIA vs seizure as cause of her symptoms.  Arrhythmia also considered on the differential.   ROS: A 14 point ROS was performed and is negative except as noted in the HPI.    Past Medical History:  Diagnosis Date  . Body mass index (BMI) of  22.0-22.9 in adult   . Cervical cancer ([place])   . H/O cervical fracture   . H/O osteoporosis   . H/O rheumatoid arthritis   . H/O: CVA (cerebrovascular accident) 2012  . H/O: osteoarthritis   . History of urinary frequency   . Hx of diverticulitis of colon   . Hypertension      Family History  Problem Relation Age of Onset  . Kidney disease Mother        MALIGNANT NEOPLASM  . Arthritis Father   . Heart Problems Father   . Lung disease Brother      Social History:  reports that she has quit smoking. she has never used smokeless tobacco. She reports that she drinks alcohol. Her drug history is not on file.   Exam: Current vital signs: BP (!) 154/68   Pulse 64   Temp 98.4 F (36.9 C) (Oral)   Resp 17   Ht 5\' 4"  (1.626 m)   Wt 143 lb (64.9 kg)   SpO2 94%   BMI 24.55 kg/m  Vital signs in last 24 hours: Temp:  [98.4 F (36.9 C)] 98.4 F (36.9 C) (02/04 1311) Pulse Rate:  [62-82] 64 (02/04 1445) Resp:  [14-20] 17 (02/04 1445) BP: (148-186)/(68-88) 154/68 (02/04 1445) SpO2:  [94 %-96 %] 94 % (02/04 1445) Weight:  [143 lb (64.9 kg)] 143 lb (64.9 kg) (02/04 1311)   Physical Exam  Constitutional: Appears well-developed and well-nourished.  Psych: Affect appropriate to situation  Eyes: No scleral injection HENT: No oropharynx obstrucion Head: Normocephalic, atraumatic.  Cardiovascular: Normal rate, regular rhythm, mechanical valve click is present.  Respiratory: Effort normal, non-labored breathing, clear to auscultation GI: Soft.  No distension. There is no tenderness.  Skin: WDI  Neuro: Mental Status: Patient is awake, alert, oriented to person, place, month, year, and situation. Patient is unable to give a clear and coherent history based on inability to recall events. No signs of aphasia or neglect Cranial Nerves: II: Visual Fields are full. Pupils are equal, round, and reactive to light.   III,IV, VI: EOMI without ptosis or diploplia.  V: Facial sensation is  symmetric to light touch VII: Facial movement is symmetric.  VIII: hearing is intact to voice X: Uvula elevates symmetrically XI: Shoulder shrug is symmetric. XII: tongue is midline without atrophy or fasciculations.  Motor: Tone is normal. Bulk is normal. 5/5 strength was present in all four extremities.  Sensory: Sensation is symmetric to light touch in the arms and legs. Deep Tendon Reflexes: 2+ and symmetric in the biceps and patellae.  Cerebellar: Finger to nose are intact bilaterally  I have reviewed labs in epic and the results pertinent to this consultation are: INR 1.9 CBC normal CMET normal except for chloride 100 and ALT 12 Magnesium 2.0 Urinalysis without evidence of infection Troponin 0.01  I have reviewed the images obtained: CT head - No acute findings. Age related atrophy and chronic small-vessel ischemic changes.  Chest x-ray - no acute process  Impression:  84 yo woman with PMHx of prior CVA, HTN, HLD, osteoarthritis, mechanical aortic valve here with episodes of alteration in mental status at her Coumadin clinic today.  Labs and imaging thus far have not revealed a cause of her symptoms.  Neurologic exam does not reveal any deficits.  Electrolytes are normal, kidney function is normal.  CBC without evidence of infection. INR is subtherapeutic.  She apparently has had episodes of confusion during recent years but her change today at the Coumadin clinic is concerning for a neurologic etiology such as seizure vs TIA.  It is unclear if prior episodes of confusion have included typical staring off such as today.  If so, consideration will need to be given for AED treatment.  If work up here is otherwise unrevealing for a cause and prior episodes are felt to be due to mild confusion, perhaps today's symptoms are vagally mediated from blood draw earlier today.  Recommendations: 1) MRI brain.  If shows acute stroke, she will need stroke work up 2) EEG  ** This plan has  not yet been discussed with attending neurologist.**   Jule Ser, PGY3 12/16/2017, 3:49 PM [facility] Pager: [phone]

## 2017-12-16 NOTE — Progress Notes (Signed)
Pt arrived to 3W13 via stretcher.  Pt ambulated to bathroom and then to bed without difficulty.  Telemetry applied and CCMD notified.  VSS.  Will continue to monitor.  Cori Razor, RN

## 2017-12-16 NOTE — ED Notes (Signed)
Pt's daughter stated pt had an episode of confusion that lasted all day on Saturday-12/14/17

## 2017-12-16 NOTE — Patient Instructions (Signed)
Take an extra 2.5mg  today, then start taking coumadin 7.5mg  daily except 5mg  on Monday and Friday.  Recheck in 2 weeks. Coumadin Clinic [phone] Main [phone]

## 2017-12-16 NOTE — Progress Notes (Addendum)
ANTICOAGULATION CONSULT NOTE - Initial Consult  Pharmacy Consult for warfarin Indication: history of CVA, mechanical aortic valve  Allergies  Allergen Reactions  . Penicillins Swelling    Has patient had a PCN reaction causing immediate rash, facial/tongue/throat swelling, SOB or lightheadedness with hypotension: Yes Has patient had a PCN reaction causing severe rash involving mucus membranes or skin necrosis: No Has patient had a PCN reaction that required hospitalization: No Has patient had a PCN reaction occurring within the last 10 years: No If all of the above answers are "NO", then may proceed with Cephalosporin use.    Patient Measurements: Height: 5\' 4"  (162.6 cm) Weight: 143 lb (64.9 kg) IBW/kg (Calculated) : 54.7  Vital Signs: Temp: 98.4 F (36.9 C) (02/04 1311) Temp Source: Oral (02/04 1311) BP: 189/78 (02/04 1655) Pulse Rate: 66 (02/04 1655)  Labs: Recent Labs    12/16/17 1146 12/16/17 1345  HGB  --  13.3  HCT  --  40.3  PLT  --  213  LABPROT  --  21.7*  INR 2.0 1.91  CREATININE  --  0.76    Estimated Creatinine Clearance: 43.6 mL/min (by C-G formula based on SCr of 0.76 mg/dL).   Medical History: Past Medical History:  Diagnosis Date  . Body mass index (BMI) of 22.0-22.9 in adult   . Cervical cancer ([place])   . H/O cervical fracture   . H/O osteoporosis   . H/O rheumatoid arthritis   . H/O: CVA (cerebrovascular accident) 2012  . H/O: osteoarthritis   . History of urinary frequency   . Hx of diverticulitis of colon   . Hypertension     Assessment: 84 year old female who presented to anticoagulation clinic this morning and has brief period of not being responsive with concern for stroke or seizure patient was admitted to hospital. INR below goal at 2 at clinic, recheck in house was 1.9, patient has higher goal.   No bleeding issues noted. CBC appears within normal limit.   PTA warfarin dose: 5mg  on Monday and Friday, 7.5mg  all other days  Goal  of Therapy:  INR goal 2.5-3.5 Monitor platelets by anticoagulation protocol: Yes   Plan:  Will continue with plan put into place by coumadin clinic of 7.5mg  of warfarin tonight. If INR still low tomorrow may need heparin bridge Daily INR for now  Erin Hearing PharmD., BCPS Clinical Pharmacist 12/16/2017 5:03 PM

## 2017-12-16 NOTE — ED Triage Notes (Signed)
BIB [facility]: Pt from cardiology with a witnessed episode of unresponsiveness that lasted about 45 seconds.  No LOC reported. Pt seeing cardiology for an INR check.  Pt A/O x4 upon EMS arrival.  PTA vitals: BP 140/90, HR 70, RR 16, CBG 99, Sp02 99% RA.

## 2017-12-16 NOTE — ED Notes (Signed)
Daughter at bedside states pt has a hx of electrolyte imbalances that causes her to have a blank stare for a brief period.

## 2017-12-16 NOTE — H&P (Signed)
History and Physical    Virginia Franklin PNT:[id number] DOB: Mar 24, 1929 DOA: 12/16/2017  PCP: [name], MD   Patient coming from: Home  I have personally briefly reviewed patient's old medical records in [place]  Chief Complaint: Altered mental status  HPI: Virginia Franklin is a 84 y.o. female with medical history significant of mechanical aortic valve on anticoagulation, hypertension, hyperlipidemia and prior CVA who was sent from Coumadin clinic because of altered mental status episode today.  Patient states that she had an episode a few days ago that lasted for several hours where she could not remember different things in her house but it resolved on its own.  Today while she was at Coumadin clinic, she apparently zoned out and could not talk to anyone for approximately 45 seconds.  This improved but she does not recall the episode.  No history of any seizure-like activity, fever, nausea, vomiting, trauma to head, fall, chest pain, cough, shortness of breath, palpitations, dizziness, blurring of vision, bowel or bladder incontinence.  ED Course: CT scan of the head was negative for any acute abnormality.  ED provider consulted with neurology who recommended EEG and MRI of the brain.  Hospitalist service was called to evaluate the patient.  Review of Systems: As per HPI otherwise 10 point review of systems negative.    Past Medical History:  Diagnosis Date  . Body mass index (BMI) of 22.0-22.9 in adult   . Cervical cancer ([place])   . H/O cervical fracture   . H/O osteoporosis   . H/O rheumatoid arthritis   . H/O: CVA (cerebrovascular accident) 2012  . H/O: osteoarthritis   . History of urinary frequency   . Hx of diverticulitis of colon   . Hypertension     Past Surgical History:  Procedure Laterality Date  . ABDOMINAL HYSTERECTOMY    . CARDIAC SURGERY    . CATARACT EXTRACTION    . SMALL INTESTINE SURGERY     Social history  reports that she has quit  smoking. she has never used smokeless tobacco. She reports that she drinks alcohol. Her drug history is not on file.  Lives at home alone  Allergies  Allergen Reactions  . Penicillins Swelling    Has patient had a PCN reaction causing immediate rash, facial/tongue/throat swelling, SOB or lightheadedness with hypotension: Yes Has patient had a PCN reaction causing severe rash involving mucus membranes or skin necrosis: No Has patient had a PCN reaction that required hospitalization: No Has patient had a PCN reaction occurring within the last 10 years: No If all of the above answers are "NO", then may proceed with Cephalosporin use.    Family History  Problem Relation Age of Onset  . Kidney disease Mother        MALIGNANT NEOPLASM  . Arthritis Father   . Heart Problems Father   . Lung disease Brother     Prior to Admission medications   Medication Sig Start Date End Date Taking? Authorizing Provider  acetaminophen (TYLENOL) 650 MG CR tablet Take 650 mg by mouth every 8 (eight) hours as needed for pain.   Yes [provider]  Calcium Citrate-Vitamin D (CALCIUM + D PO) Take 2 tablets by mouth at bedtime.   Yes [provider]  escitalopram (LEXAPRO) 5 MG tablet Take 1 tablet (5 mg total) by mouth daily. 10/25/17  Yes [name], MD  ezetimibe-simvastatin (VYTORIN) 10-40 MG tablet Take 1 tablet by mouth daily. 10/25/17  Yes  [name], MD  furosemide (LASIX) 20 MG tablet Take 1 tablet (20 mg total) by mouth daily. 10/25/17  Yes [name], MD  lisinopril (PRINIVIL,ZESTRIL) 10 MG tablet Take 1 tablet (10 mg total) by mouth daily. Patient taking differently: Take 20 mg by mouth at bedtime.  10/25/17  Yes [name], MD  Omega-3 Fatty Acids (FISH OIL) 1000 MG CAPS Take 2,000 mg by mouth every morning.   Yes [provider]  TURMERIC PO Take 3 capsules by mouth every morning. Turmeric with biophrine 500mg    Yes [provider]  vitamin B-12  (CYANOCOBALAMIN) 1000 MCG tablet Take 1,000 mcg by mouth daily.   Yes [provider]  warfarin (COUMADIN) 5 MG tablet Take 1 tablet (5 mg total) by mouth daily. Patient taking differently: Take 5-7.5 mg by mouth See admin instructions. Patient takes 5mg  twice weekly (on Mon and Fri) and 7.5mg  five times weekly (on Tues, Wed, Thurs, Sat, and Sun) 10/25/17  Yes [name], MD  warfarin (COUMADIN) 7.5 MG tablet Take 1 tablet (7.5 mg total) by mouth daily. Take as directed at by coumadin clinic Patient not taking: Reported on 12/16/2017 10/25/17   [name], MD    Physical Exam: Vitals:   12/16/17 1430 12/16/17 1445 12/16/17 1600 12/16/17 1614  BP: (!) 167/86 (!) 154/68 (!) 148/76   Pulse: 62 64 66 73  Resp: 14 17 14 15   Temp:      TempSrc:      SpO2: 95% 94% 96% 95%  Weight:      Height:        Constitutional: Elderly female lying in bed, NAD, calm, comfortable Vitals:   12/16/17 1430 12/16/17 1445 12/16/17 1600 12/16/17 1614  BP: (!) 167/86 (!) 154/68 (!) 148/76   Pulse: 62 64 66 73  Resp: 14 17 14 15   Temp:      TempSrc:      SpO2: 95% 94% 96% 95%  Weight:      Height:       Eyes: PERRL, lids and conjunctivae normal ENMT: Mucous membranes are moist. Posterior pharynx clear of any exudate or lesions Neck: normal, supple, no masses, no thyromegaly Respiratory: Bilateral decreased breath sounds at bases.  No wheezing.  No accessory muscle use.  Cardiovascular: S1-S2 positive, rate controlled.  No murmurs.  No pedal edema Abdomen: no tenderness, no masses palpated. No hepatosplenomegaly. Bowel sounds positive.  Musculoskeletal: no clubbing / cyanosis. No joint deformity upper and lower extremities.  Skin: no rashes, lesions, ulcers. No induration Neurologic: Alert awake oriented.  Moving extremities.  No focal neurological deficit.  Power is 5 out of 5 in all 4 extremities Psychiatric: Normal judgment and insight. Alert and oriented x 3. Normal mood.    Labs on  Admission: I have personally reviewed following labs and imaging studies  CBC: Recent Labs  Lab 12/16/17 1345  WBC 4.8  NEUTROABS 3.3  HGB 13.3  HCT 40.3  MCV 91.2  PLT 161   Basic Metabolic Panel: Recent Labs  Lab 12/16/17 1345  NA 135  K 3.8  CL 100*  CO2 23  GLUCOSE 97  BUN 14  CREATININE 0.76  CALCIUM 9.5  MG 2.0   GFR: Estimated Creatinine Clearance: 43.6 mL/min (by C-G formula based on SCr of 0.76 mg/dL). Liver Function Tests: Recent Labs  Lab 12/16/17 1345  AST 20  ALT 12*  ALKPHOS 62  BILITOT 0.9  PROT 7.1  ALBUMIN 3.8  No results for input(s): LIPASE, AMYLASE in the last 168 hours. No results for input(s): AMMONIA in the last 168 hours. Coagulation Profile: Recent Labs  Lab 12/16/17 1146 12/16/17 1345  INR 2.0 1.91   Cardiac Enzymes: No results for input(s): CKTOTAL, CKMB, CKMBINDEX, TROPONINI in the last 168 hours. BNP (last 3 results) No results for input(s): PROBNP in the last 8760 hours. HbA1C: No results for input(s): HGBA1C in the last 72 hours. CBG: No results for input(s): GLUCAP in the last 168 hours. Lipid Profile: No results for input(s): CHOL, HDL, LDLCALC, TRIG, CHOLHDL, LDLDIRECT in the last 72 hours. Thyroid Function Tests: No results for input(s): TSH, T4TOTAL, FREET4, T3FREE, THYROIDAB in the last 72 hours. Anemia Panel: No results for input(s): VITAMINB12, FOLATE, FERRITIN, TIBC, IRON, RETICCTPCT in the last 72 hours. Urine analysis:    Component Value Date/Time   COLORURINE YELLOW 12/16/2017 1347   APPEARANCEUR CLOUDY (A) 12/16/2017 1347   LABSPEC 1.010 12/16/2017 1347   PHURINE 9.0 (H) 12/16/2017 1347   GLUCOSEU NEGATIVE 12/16/2017 1347   HGBUR NEGATIVE 12/16/2017 1347   BILIRUBINUR NEGATIVE 12/16/2017 1347   KETONESUR NEGATIVE 12/16/2017 1347   PROTEINUR NEGATIVE 12/16/2017 1347   NITRITE NEGATIVE 12/16/2017 1347   LEUKOCYTESUR NEGATIVE 12/16/2017 1347    Radiological Exams on Admission: Dg Chest 2  View  Result Date: 12/16/2017 CLINICAL DATA:  Altered mental status. EXAM: CHEST  2 VIEW COMPARISON:  None. FINDINGS: Prior median sternotomy. The heart size and mediastinal contours are within normal limits. Atherosclerotic calcification of the aortic arch. Normal pulmonary vascularity. No focal consolidation, pleural effusion, or pneumothorax. No acute osseous abnormality. IMPRESSION: No active cardiopulmonary disease. Electronically Signed   By: Titus Dubin M.D.   On: 12/16/2017 15:14   Ct Head Wo Contrast  Result Date: 12/16/2017 CLINICAL DATA:  Witnessed episode of unresponsiveness, 45 seconds in duration. EXAM: CT HEAD WITHOUT CONTRAST TECHNIQUE: Contiguous axial images were obtained from the base of the skull through the vertex without intravenous contrast. COMPARISON:  None. FINDINGS: Brain: Generalized age related atrophy. Chronic small-vessel ischemic changes of the hemispheric white matter. No sign of acute infarction, mass lesion, hemorrhage, hydrocephalus or extra-axial collection. No cortical or large vessel territory insult. Vascular: There is atherosclerotic calcification of the major vessels at the base of the brain. Skull: Normal Sinuses/Orbits: Clear/normal Other: None IMPRESSION: No acute finding by CT. Age related atrophy and chronic small-vessel ischemic changes. Atherosclerotic calcification of the major vessels at the base of the brain. Electronically Signed   By: Nelson Chimes M.D.   On: 12/16/2017 15:02     Assessment/Plan Active Problems:   Altered mental status   Chronic anticoagulation   HTN (hypertension)   Dyslipidemia  Altered mental status and confusion in a patient with history of prior CVA -Unclear if the patient had an absence seizure versus a TIA -CT scan of the head was negative for acute abnormality. -Neurology has been consulted.  Follow their recommendations -MRI of the brain.  Ultrasound carotids.  EEG.  2D echo -Monitor mental status.  Fall  precautions -PT/OT/speech evaluation  Hypertension -Monitor blood pressure.  Resume antihypertensives for tomorrow if MRI of the brain is negative for acute stroke  Dyslipidemia -Fasting lipid profile in a.m.  Resume home medication if able to swallow  History of mechanical aortic valve replacement on chronic anticoagulation -Continue Coumadin dosed as per pharmacy.  Monitor INR   DVT prophylaxis: Coumadin Code Status: DNR Family Communication: Spoke to daughter at bedside  disposition Plan: Home  in 1-2 days Consults called: Neurology called by ED Admission status: Observation in telemetry  Severity of Illness: The appropriate patient status for this patient is OBSERVATION. Observation status is judged to be reasonable and necessary in order to provide the required intensity of service to ensure the patient's safety. The patient's presenting symptoms, physical exam findings, and initial radiographic and laboratory data in the context of their medical condition is felt to place them at decreased risk for further clinical deterioration. Furthermore, it is anticipated that the patient will be medically stable for discharge from the hospital within 2 midnights of admission. The following factors support the patient status of observation.   " The patient's presenting symptoms include altered mental status and confusion. " The physical exam findings include unable to recall the event. " The initial radiographic and laboratory data are CT scan of the head was negative for acute intracranial abnormality.      Aline August MD [facility] Pager [phone]  If 7PM-7AM, please contact night-coverage [email or web address] Password [facility]  12/16/2017, 4:36 PM

## 2017-12-16 NOTE — Telephone Encounter (Signed)
Patient came in for coumadin check. INR was 2.0, below goal of 2.5-3.5. Patient mentioned that a she had an episode about a week ago where she forgot to take lisinopril and then her BP was significantly elevated. She stated that the next day she went back to normal. I continued to ask her questions about her dose and change in diet. Patient stared blankly at me for 30-45 seconds without moving. I repeated the questions three to four times but patient did not respond. Dr. Curt Bears was brought in to evaluate patient. Patient started to become more responsive, however she did not know the year or the current president. We called her daughter, Virginia Franklin, who stated that this was worse from her baseline. She takes an electrolyte mix in the morning which her daughter states helps with her memory. The patient could not remember if she had taken this or not, but her daughter thinks she did. Given patient's history of stroke, subtherapeutic INR, non-responsiveness of patient for 30-45 seconds, and daughter's report of altered mental status from baseline, EMS was called at 12:22pm to evaluate for TIA or stroke. Pt became tearful when she was told she needed to go to the hospital. Reassured her daughter will meet her there.  BP 126/70 (140/90 on recheck), HR 66, O2 sat 99%, INR 2.0 (goal 2.5-3.5), glucose 97.  EMS arrived at clinic at 12:30pm and pt was taken to [facility].

## 2017-12-17 ENCOUNTER — Observation Stay (HOSPITAL_COMMUNITY): Payer: Medicare HMO

## 2017-12-17 DIAGNOSIS — R404 Transient alteration of awareness: Secondary | ICD-10-CM | POA: Diagnosis not present

## 2017-12-17 DIAGNOSIS — R4182 Altered mental status, unspecified: Secondary | ICD-10-CM | POA: Diagnosis not present

## 2017-12-17 DIAGNOSIS — Z7901 Long term (current) use of anticoagulants: Secondary | ICD-10-CM | POA: Diagnosis not present

## 2017-12-17 DIAGNOSIS — I10 Essential (primary) hypertension: Secondary | ICD-10-CM | POA: Diagnosis not present

## 2017-12-17 DIAGNOSIS — E785 Hyperlipidemia, unspecified: Secondary | ICD-10-CM | POA: Diagnosis not present

## 2017-12-17 LAB — COMPREHENSIVE METABOLIC PANEL
ALBUMIN: 3.3 g/dL — AB (ref 3.5–5.0)
ALT: 12 U/L — AB (ref 14–54)
ANION GAP: 9 (ref 5–15)
AST: 17 U/L (ref 15–41)
Alkaline Phosphatase: 54 U/L (ref 38–126)
BUN: 15 mg/dL (ref 6–20)
CHLORIDE: 104 mmol/L (ref 101–111)
CO2: 23 mmol/L (ref 22–32)
CREATININE: 0.83 mg/dL (ref 0.44–1.00)
Calcium: 8.9 mg/dL (ref 8.9–10.3)
GFR calc non Af Amer: >60 mL/min (ref >60)
Glucose, Bld: 97 mg/dL (ref 65–99)
Potassium: 3.9 mmol/L (ref 3.5–5.1)
SODIUM: 136 mmol/L (ref 135–145)
Total Bilirubin: 0.7 mg/dL (ref 0.3–1.2)
Total Protein: 6.2 g/dL — ABNORMAL LOW (ref 6.5–8.1)

## 2017-12-17 LAB — LIPID PANEL
CHOL/HDL RATIO: 2.3 ratio
Cholesterol: 136 mg/dL (ref 0–200)
HDL: 59 mg/dL (ref >40)
LDL CALC: 68 mg/dL (ref 0–99)
Triglycerides: 45 mg/dL (ref <150)
VLDL: 9 mg/dL (ref 0–40)

## 2017-12-17 LAB — CBC WITH DIFFERENTIAL/PLATELET
BASOS PCT: 1 %
Basophils Absolute: 0 10*3/uL (ref 0.0–0.1)
EOS ABS: 0.1 10*3/uL (ref 0.0–0.7)
EOS PCT: 2 %
HCT: 37.8 % (ref 36.0–46.0)
Hemoglobin: 12.1 g/dL (ref 12.0–15.0)
LYMPHS ABS: 1.4 10*3/uL (ref 0.7–4.0)
Lymphocytes Relative: 37 %
MCH: 29.2 pg (ref 26.0–34.0)
MCHC: 32 g/dL (ref 30.0–36.0)
MCV: 91.3 fL (ref 78.0–100.0)
Monocytes Absolute: 0.4 10*3/uL (ref 0.1–1.0)
Monocytes Relative: 12 %
Neutro Abs: 1.9 10*3/uL (ref 1.7–7.7)
Neutrophils Relative %: 48 %
PLATELETS: 203 10*3/uL (ref 150–400)
RBC: 4.14 MIL/uL (ref 3.87–5.11)
RDW: 13.7 % (ref 11.5–15.5)
WBC: 3.8 10*3/uL — ABNORMAL LOW (ref 4.0–10.5)

## 2017-12-17 LAB — HEMOGLOBIN A1C
Hgb A1c MFr Bld: 5.5 % (ref 4.8–5.6)
Mean Plasma Glucose: 111.15 mg/dL

## 2017-12-17 LAB — URINE CULTURE: Culture: NO GROWTH

## 2017-12-17 LAB — PROTIME-INR
INR: 2.01
Prothrombin Time: 22.6 seconds — ABNORMAL HIGH (ref 11.4–15.2)

## 2017-12-17 LAB — MAGNESIUM: Magnesium: 1.8 mg/dL (ref 1.7–2.4)

## 2017-12-17 LAB — VITAMIN B12: VITAMIN B 12: 1011 pg/mL — AB (ref 180–914)

## 2017-12-17 LAB — TSH: TSH: 2.113 u[IU]/mL (ref 0.350–4.500)

## 2017-12-17 MED ORDER — WARFARIN SODIUM 5 MG PO TABS: 5.0000 mg | ORAL_TABLET | ORAL | Status: DC

## 2017-12-17 MED ORDER — LISINOPRIL 10 MG PO TABS: 20.0000 mg | ORAL_TABLET | Freq: Every day | ORAL | Status: DC

## 2017-12-17 MED ORDER — WARFARIN SODIUM 5 MG PO TABS: 10.0000 mg | ORAL_TABLET | Freq: Once | ORAL | Status: DC

## 2017-12-17 NOTE — Progress Notes (Signed)
EEG complete - results pending 

## 2017-12-17 NOTE — Evaluation (Signed)
Physical Therapy Evaluation Patient Details Name: Virginia Franklin MRN: [id number] DOB: February 02, 1929 Today's Date: 12/17/2017   History of Present Illness  84 y.o. female with PMH significant of mechanical aortic valve on anticoagulation, HTN, HLD and prior CVA who was sent from Coumadin clinic because of unresponsive episode of 45 sec on 2/4. Head CT and CXR-unremarkable. Brain MRI-nothing acute. Admitted for TIA vs seizure.   Clinical Impression  Patient presents with memory issues, subjective word finding difficulties and mild balance deficits s/p above. Tolerated transfers and gait training with min guard assist for balance/safety. Pt with 2/4 DOE during activity but reports this as baseline. Lives in ALF and involved in exercise classes and painting classes. Per chart, pt having progressive issues with memory over the last few weeks. Functioning close to baseline from a mobility stand point. Might benefit from further cognitive assessment. Pt does not require skilled therapy services. Discharge from therapy.    Follow Up Recommendations No PT follow up;Supervision - Intermittent    Equipment Recommendations  None recommended by PT    Recommendations for Other Services       Precautions / Restrictions Precautions Precautions: Fall Restrictions Weight Bearing Restrictions: No      Mobility  Bed Mobility               General bed mobility comments: Sitting in chair upon PT arrival.   Transfers Overall transfer level: Modified independent Equipment used: None             General transfer comment: Stood from chair without difficulty.   Ambulation/Gait Ambulation/Gait assistance: Min guard Ambulation Distance (Feet): 250 Feet Assistive device: None Gait Pattern/deviations: Step-through pattern;Decreased stride length Gait velocity: decreased   General Gait Details: Slow, mildly unsteady gait with no evidence of imbalance. 2/4 DOE. HR stable- in A-fib.    Stairs            Wheelchair Mobility    Modified Rankin (Stroke Patients Only) Modified Rankin (Stroke Patients Only) Pre-Morbid Rankin Score: Moderate disability Modified Rankin: Moderate disability     Balance Overall balance assessment: Needs assistance Sitting-balance support: Feet supported;No upper extremity supported Sitting balance-Leahy Scale: Good     Standing balance support: During functional activity Standing balance-Leahy Scale: Fair                               Pertinent Vitals/Pain Pain Assessment: Faces Faces Pain Scale: Hurts little more Pain Location: right knee- chronic Pain Descriptors / Indicators: Aching;Discomfort Pain Intervention(s): Monitored during session;Repositioned    Home Living Family/patient expects to be discharged to:: Assisted living Living Arrangements: Alone Available Help at Discharge: Available 24 hours/day Type of Home: Apartment(second floor) Home Access: Elevator     Home Layout: One level Home Equipment: Shower seat - built in Additional Comments: [place]    Prior Function Level of Independence: Independent         Comments: ADLs and light IADLs. Does not drive. Very active in facility community including excercises and painting classes.     Hand Dominance   Dominant Hand: Right    Extremity/Trunk Assessment   Upper Extremity Assessment Upper Extremity Assessment: Defer to OT evaluation    Lower Extremity Assessment Lower Extremity Assessment: Generalized weakness(but functional)    Cervical / Trunk Assessment Cervical / Trunk Assessment: Normal  Communication   Communication: Expressive difficulties(reports some word finding difficulties)  Cognition Arousal/Alertness: Awake/alert Behavior During Therapy: WFL for tasks  assessed/performed Overall Cognitive Status: No family/caregiver present to determine baseline cognitive functioning                                  General Comments: Pt with slow processing and requires increased cues and time to answer questions.  Only able to recall 1/3 words for ST memory. Hx of memory issues per chart.       General Comments General comments (skin integrity, edema, etc.): Reports 1 fall >6 months ago    Exercises     Assessment/Plan    PT Assessment Patent does not need any further PT services  PT Problem List         PT Treatment Interventions      PT Goals (Current goals can be found in the Care Plan section)  Acute Rehab PT Goals Patient Stated Goal: to return to PLOF PT Goal Formulation: All assessment and education complete, DC therapy    Frequency     Barriers to discharge        Co-evaluation               AM-PAC PT "6 Clicks" Daily Activity  Outcome Measure Difficulty turning over in bed (including adjusting bedclothes, sheets and blankets)?: None Difficulty moving from lying on back to sitting on the side of the bed? : None Difficulty sitting down on and standing up from a chair with arms (e.g., wheelchair, bedside commode, etc,.)?: None Help needed moving to and from a bed to chair (including a wheelchair)?: None Help needed walking in hospital room?: A Little Help needed climbing 3-5 steps with a railing? : A Little 6 Click Score: 22    End of Session Equipment Utilized During Treatment: Gait belt Activity Tolerance: Patient tolerated treatment well Patient left: in chair;with call bell/phone within reach;with chair alarm set Nurse Communication: Mobility status PT Visit Diagnosis: Unsteadiness on feet (R26.81)    Time: 5400-8676 PT Time Calculation (min) (ACUTE ONLY): 18 min   Charges:   PT Evaluation $PT Eval Low Complexity: 1 Low     PT G CodesWray Kearns, PT, DPT [phone]    Tayte Childers A Evelynne Spiers 12/17/2017, 9:35 AM

## 2017-12-17 NOTE — Procedures (Signed)
ELECTROENCEPHALOGRAM REPORT  Date of Study: 12/17/2017  Patient's Name: Virginia Franklin [facility] MRN: [id number] Date of Birth: 1929-10-10  Referring Provider: Dr. Roland Rack  Clinical History: This is an 84 year old woman with an episode of altered mental status, reported to zone out and could not talk for 45 seconds. She is amnestic of event.  Medications: Lexapro Vytorin Lasix Coumadin  Technical Summary: A multichannel digital EEG recording measured by the international 10-20 system with electrodes applied with paste and impedances below 5000 ohms performed in our laboratory with EKG monitoring in an awake and asleep patient.  Hyperventilation was not performed. Photic stimulation was performed.  The digital EEG was referentially recorded, reformatted, and digitally filtered in a variety of bipolar and referential montages for optimal display.    Description: The patient is awake and asleep during the recording.  During maximal wakefulness, there is a symmetric, medium voltage 8 Hz posterior dominant rhythm that attenuates with eye opening.  The record is symmetric.  During drowsiness and sleep, there is an increase in theta slowing of the background, at times sharply contoured over the right temporal region without clear epileptogenic potential.  Vertex waves and symmetric sleep spindles were seen.  Photic stimulation did not elicit any abnormalities.  There were no clear epileptiform discharges or electrographic seizures seen.    EKG lead was unremarkable.  Impression: This awake and asleep EEG is within normal limits.  Clinical Correlation: A normal EEG does not exclude a clinical diagnosis of epilepsy. Clinical correlation is advised.   Ellouise Newer, M.D.

## 2017-12-17 NOTE — Discharge Instructions (Signed)
Information on my medicine - Coumadin   (Warfarin)  This medication education was reviewed with me or my healthcare representative as part of my discharge preparation.  The pharmacist that spoke with me during my hospital stay was:  Saundra Shelling, [facility]  Why was Coumadin prescribed for you? Coumadin was prescribed for you because you have a blood clot or a medical condition that can cause an increased risk of forming blood clots. Blood clots can cause serious health problems by blocking the flow of blood to the heart, lung, or brain. Coumadin can prevent harmful blood clots from forming. As a reminder your indication for Coumadin is:   Blood Clot Prevention After Heart Valve Surgery  What test will check on my response to Coumadin? While on Coumadin (warfarin) you will need to have an INR test regularly to ensure that your dose is keeping you in the desired range. The INR (international normalized ratio) number is calculated from the result of the laboratory test called prothrombin time (PT).  If an INR APPOINTMENT HAS NOT ALREADY BEEN MADE FOR YOU please schedule an appointment to have this lab work done by your health care provider within 7 days. Your INR goal is usually a number between:  2 to 3 or your provider may give you a more narrow range like 2-2.5.  Ask your health care provider during an office visit what your goal INR is.  What  do you need to  know  About  COUMADIN? Take Coumadin (warfarin) exactly as prescribed by your healthcare provider about the same time each day.  DO NOT stop taking without talking to the doctor who prescribed the medication.  Stopping without other blood clot prevention medication to take the place of Coumadin may increase your risk of developing a new clot or stroke.  Get refills before you run out.  What do you do if you miss a dose? If you miss a dose, take it as soon as you remember on the same day then continue your regularly scheduled regimen the next  day.  Do not take two doses of Coumadin at the same time.  Important Safety Information A possible side effect of Coumadin (Warfarin) is an increased risk of bleeding. You should call your healthcare provider right away if you experience any of the following: ? Bleeding from an injury or your nose that does not stop. ? Unusual colored urine (red or dark brown) or unusual colored stools (red or black). ? Unusual bruising for unknown reasons. ? A serious fall or if you hit your head (even if there is no bleeding).  Some foods or medicines interact with Coumadin (warfarin) and might alter your response to warfarin. To help avoid this: ? Eat a balanced diet, maintaining a consistent amount of Vitamin K. ? Notify your provider about major diet changes you plan to make. ? Avoid alcohol or limit your intake to 1 drink for women and 2 drinks for men per day. (1 drink is 5 oz. wine, 12 oz. beer, or 1.5 oz. liquor.)  Make sure that ANY health care provider who prescribes medication for you knows that you are taking Coumadin (warfarin).  Also make sure the healthcare provider who is monitoring your Coumadin knows when you have started a new medication including herbals and non-prescription products.  Coumadin (Warfarin)  Major Drug Interactions  Increased Warfarin Effect Decreased Warfarin Effect  Alcohol (large quantities) Antibiotics (esp. Septra/Bactrim, Flagyl, Cipro) Amiodarone (Cordarone) Aspirin (ASA) Cimetidine (Tagamet) Megestrol (Megace) NSAIDs (  ibuprofen, naproxen, etc.) °Piroxicam (Feldene) °Propafenone (Rythmol SR) °Propranolol (Inderal) °Isoniazid (INH) °Posaconazole (Noxafil) Barbiturates (Phenobarbital) °Carbamazepine (Tegretol) °Chlordiazepoxide (Librium) °Cholestyramine (Questran) °Griseofulvin °Oral Contraceptives °Rifampin °Sucralfate (Carafate) °Vitamin K  ° °Coumadin® (Warfarin) Major Herbal Interactions  °Increased Warfarin Effect Decreased Warfarin Effect   °Garlic °Ginseng °Ginkgo biloba Coenzyme Q10 °Green tea °St. John’s wort   ° °Coumadin® (Warfarin) FOOD Interactions  °Eat a consistent number of servings per week of foods HIGH in Vitamin K °(1 serving = ½ cup)  °Collards (cooked, or boiled & drained) °Kale (cooked, or boiled & drained) °Mustard greens (cooked, or boiled & drained) °Parsley *serving size only = ¼ cup °Spinach (cooked, or boiled & drained) °Swiss chard (cooked, or boiled & drained) °Turnip greens (cooked, or boiled & drained)  °Eat a consistent number of servings per week of foods MEDIUM-HIGH in Vitamin K °(1 serving = 1 cup)  °Asparagus (cooked, or boiled & drained) °Broccoli (cooked, boiled & drained, or raw & chopped) °Brussel sprouts (cooked, or boiled & drained) *serving size only = ½ cup °Lettuce, raw (green leaf, endive, romaine) °Spinach, raw °Turnip greens, raw & chopped  ° °These websites have more information on Coumadin (warfarin):  www.coumadin.com; °www.ahrq.gov/consumer/coumadin.htm; ° ° ° °

## 2017-12-17 NOTE — Discharge Summary (Signed)
Physician Discharge Summary  Virginia Franklin YOV:[id number] DOB: April 16, 1929 DOA: 12/16/2017  PCP: [name], MD  Admit date: 12/16/2017 Discharge date: 12/17/2017  Admitted From: Home Disposition:  Home  Recommendations for Outpatient Follow-up:  1. Follow up with PCP in 1 week 2. Patient will benefit from outpatient neurology evaluation and follow-up   Home Health: Home SLP evaluation and follow-up Equipment/Devices: None  Discharge Condition: Stable CODE STATUS: DNR Diet recommendation: Heart Healthy   Brief/Interim Summary: 84 year old female with history of mechanical aortic valve on anticoagulation, hypertension, hyperlipidemia and prior CVA presented with brief episode of altered mental status on 12/16/2017.  Initial CT scan of the head was negative for acute infarct.  Neurology was consulted.  MRI of the brain was negative for acute stroke.  EEG was negative for any acute seizure.  Neurology has cleared the patient for discharge.  Neurology thinks the patient did not have TIA or stroke and he thinks that patient does not need carotid ultrasound or 2D echo.  Patient will benefit from outpatient evaluation and follow-up with neurology.  Discharge Diagnoses:  Active Problems:   Altered mental status   Chronic anticoagulation   HTN (hypertension)   Dyslipidemia   Spells of speech arrest   Altered mental status and confusion in a patient with history of prior CVA - Initial CT scan of the head was negative for acute infarct.  Neurology was consulted.  MRI of the brain was negative for acute stroke.  EEG was negative for any acute seizure.  Neurology has cleared the patient for discharge. I spoke to Neurology/Dr. Leonel Ramsay on phone today who thinks the patient did not have TIA or stroke and he thinks that patient does not need carotid ultrasound or 2D echo.  Patient will benefit from outpatient evaluation and follow-up with neurology. -SLP recommends outpatient  follow-up -Tolerating physical therapy -Discharge patient home today  Probable dementia -Outpatient evaluation and follow-up with neurology  Hypertension -Continue outpatient regimen.  Outpatient follow-up  Dyslipidemia -Continue home regimen.  History of mechanical aortic valve replacement on chronic anticoagulation -Continue Coumadin.    Outpatient follow-up of INR   Discharge Instructions  Discharge Instructions    Ambulatory referral to Neurology   Complete by:  As directed    An appointment is requested in approximately: 1-2 weeks. Hospital follow up for altered mental status   Call MD for:  difficulty breathing, headache or visual disturbances   Complete by:  As directed    Call MD for:  extreme fatigue   Complete by:  As directed    Call MD for:  hives   Complete by:  As directed    Call MD for:  persistant dizziness or light-headedness   Complete by:  As directed    Call MD for:  persistant nausea and vomiting   Complete by:  As directed    Call MD for:  severe uncontrolled pain   Complete by:  As directed    Call MD for:  temperature >100.4   Complete by:  As directed    Diet - low sodium heart healthy   Complete by:  As directed    Increase activity slowly   Complete by:  As directed      Allergies as of 12/17/2017      Reactions   Penicillins Swelling   Has patient had a PCN reaction causing immediate rash, facial/tongue/throat swelling, SOB or lightheadedness with hypotension: Yes Has patient had a PCN reaction causing severe rash involving mucus  membranes or skin necrosis: No Has patient had a PCN reaction that required hospitalization: No Has patient had a PCN reaction occurring within the last 10 years: No If all of the above answers are "NO", then may proceed with Cephalosporin use.      Medication List    TAKE these medications   acetaminophen 650 MG CR tablet Commonly known as:  TYLENOL Take 650 mg by mouth every 8 (eight) hours as needed for  pain.   CALCIUM + D PO Take 2 tablets by mouth at bedtime.   escitalopram 5 MG tablet Commonly known as:  LEXAPRO Take 1 tablet (5 mg total) by mouth daily.   ezetimibe-simvastatin 10-40 MG tablet Commonly known as:  VYTORIN Take 1 tablet by mouth daily.   Fish Oil 1000 MG Caps Take 2,000 mg by mouth every morning.   furosemide 20 MG tablet Commonly known as:  LASIX Take 1 tablet (20 mg total) by mouth daily.   lisinopril 10 MG tablet Commonly known as:  PRINIVIL,ZESTRIL Take 2 tablets (20 mg total) by mouth at bedtime.   TURMERIC PO Take 3 capsules by mouth every morning. Turmeric with biophrine 500mg    vitamin B-12 1000 MCG tablet Commonly known as:  CYANOCOBALAMIN Take 1,000 mcg by mouth daily.   warfarin 5 MG tablet Commonly known as:  COUMADIN Take as directed. If you are unsure how to take this medication, talk to your nurse or doctor. Original instructions:  Take 1-1.5 tablets (5-7.5 mg total) by mouth See admin instructions. Patient takes 5mg  twice weekly (on Mon and Fri) and 7.5mg  five times weekly (on Tues, Wed, Thurs, Sat, and Sun) What changed:    how much to take  when to take this  additional instructions  Another medication with the same name was removed. Continue taking this medication, and follow the directions you see here.      Follow-up Information    [name], MD. Schedule an appointment as soon as possible for a visit in 1 week(s).   Specialty:  Family Medicine Contact information: [address] [place] [address] [phone]          Allergies  Allergen Reactions  . Penicillins Swelling    Has patient had a PCN reaction causing immediate rash, facial/tongue/throat swelling, SOB or lightheadedness with hypotension: Yes Has patient had a PCN reaction causing severe rash involving mucus membranes or skin necrosis: No Has patient had a PCN reaction that required hospitalization: No Has patient had a PCN reaction  occurring within the last 10 years: No If all of the above answers are "NO", then may proceed with Cephalosporin use.    Consultations:  Neurology   Procedures/Studies: Dg Chest 2 View  Result Date: 12/16/2017 CLINICAL DATA:  Altered mental status. EXAM: CHEST  2 VIEW COMPARISON:  None. FINDINGS: Prior median sternotomy. The heart size and mediastinal contours are within normal limits. Atherosclerotic calcification of the aortic arch. Normal pulmonary vascularity. No focal consolidation, pleural effusion, or pneumothorax. No acute osseous abnormality. IMPRESSION: No active cardiopulmonary disease. Electronically Signed   By: Titus Dubin M.D.   On: 12/16/2017 15:14   Ct Head Wo Contrast  Result Date: 12/16/2017 CLINICAL DATA:  Witnessed episode of unresponsiveness, 45 seconds in duration. EXAM: CT HEAD WITHOUT CONTRAST TECHNIQUE: Contiguous axial images were obtained from the base of the skull through the vertex without intravenous contrast. COMPARISON:  None. FINDINGS: Brain: Generalized age related atrophy. Chronic small-vessel ischemic changes of the hemispheric white matter.  No sign of acute infarction, mass lesion, hemorrhage, hydrocephalus or extra-axial collection. No cortical or large vessel territory insult. Vascular: There is atherosclerotic calcification of the major vessels at the base of the brain. Skull: Normal Sinuses/Orbits: Clear/normal Other: None IMPRESSION: No acute finding by CT. Age related atrophy and chronic small-vessel ischemic changes. Atherosclerotic calcification of the major vessels at the base of the brain. Electronically Signed   By: Nelson Chimes M.D.   On: 12/16/2017 15:02   Mr Brain Wo Contrast  Result Date: 12/16/2017 CLINICAL DATA:  Episode of altered mental status.  History of CVA. EXAM: MRI HEAD WITHOUT CONTRAST TECHNIQUE: Multiplanar, multiecho pulse sequences of the brain and surrounding structures were obtained without intravenous contrast. COMPARISON:   12/16/2017 CT head FINDINGS: Brain: No acute infarction, hemorrhage, hydrocephalus, extra-axial collection or mass lesion. Severalnonspecific foci of T2 FLAIR hyperintense signal abnormality in subcortical and periventricular white matter are compatible withmoderatechronic microvascular ischemic changes for age. Moderatebrain parenchymal volume loss. Small chronic lacunar infarcts within bilateral cerebellar hemispheres, right caudate head, right putamen, and left pons. Hemosiderin staining of the right putamen lacunar infarction. Vascular: Normal flow voids. Skull and upper cervical spine: Normal marrow signal. Sinuses/Orbits: Negative. Other: Bilateral intra-ocular lens replacement. IMPRESSION: 1. No acute intracranial abnormality identified. 2. Moderate chronic microvascular ischemic changes and parenchymal volume loss of brain. Electronically Signed   By: Kristine Garbe M.D.   On: 12/16/2017 20:50   Dg Foot 2 Views Left  Result Date: 12/04/2017 Please see detailed radiograph report in office note.  Dg Foot 2 Views Right  Result Date: 12/04/2017 Please see detailed radiograph report in office note.   EEG on 12/17/2017 Normal   Subjective: Patient seen and examined at bedside.  She denies overnight fever, nausea, vomiting, syncope.  She wants to go home.  Discharge Exam: Vitals:   12/17/17 0100 12/17/17 0500  BP: (!) 140/55 (!) 136/55  Pulse: 62 70  Resp:    Temp:    SpO2: 94% 97%   Vitals:   12/16/17 2056 12/16/17 2300 12/17/17 0100 12/17/17 0500  BP: (!) 139/53 (!) 129/50 (!) 140/55 (!) 136/55  Pulse:  70 62 70  Resp:      Temp: (!) 97.5 F (36.4 C)     TempSrc: Oral     SpO2: 98% 96% 94% 97%  Weight:      Height:        General: Pt is alert, awake, not in acute distress Cardiovascular: Rate controlled, S1/S2 + Respiratory: Bilateral decreased breath sounds at bases Abdominal: Soft, NT, ND, bowel sounds + Extremities: no edema, no cyanosis    The results of  significant diagnostics from this hospitalization (including imaging, microbiology, ancillary and laboratory) are listed below for reference.     Microbiology: No results found for this or any previous visit (from the past 240 hour(s)).   Labs: BNP (last 3 results) No results for input(s): BNP in the last 8760 hours. Basic Metabolic Panel: Recent Labs  Lab 12/16/17 1345 12/17/17 0550  NA 135 136  K 3.8 3.9  CL 100* 104  CO2 23 23  GLUCOSE 97 97  BUN 14 15  CREATININE 0.76 0.83  CALCIUM 9.5 8.9  MG 2.0 1.8   Liver Function Tests: Recent Labs  Lab 12/16/17 1345 12/17/17 0550  AST 20 17  ALT 12* 12*  ALKPHOS 62 54  BILITOT 0.9 0.7  PROT 7.1 6.2*  ALBUMIN 3.8 3.3*   No results for input(s): LIPASE, AMYLASE in the last  168 hours. No results for input(s): AMMONIA in the last 168 hours. CBC: Recent Labs  Lab 12/16/17 1345 12/17/17 0550  WBC 4.8 3.8*  NEUTROABS 3.3 1.9  HGB 13.3 12.1  HCT 40.3 37.8  MCV 91.2 91.3  PLT 213 203   Cardiac Enzymes: No results for input(s): CKTOTAL, CKMB, CKMBINDEX, TROPONINI in the last 168 hours. BNP: Invalid input(s): POCBNP CBG: No results for input(s): GLUCAP in the last 168 hours. D-Dimer No results for input(s): DDIMER in the last 72 hours. Hgb A1c Recent Labs    12/17/17 0550  HGBA1C 5.5   Lipid Profile Recent Labs    12/17/17 0550  CHOL 136  HDL 59  LDLCALC 68  TRIG 45  CHOLHDL 2.3   Thyroid function studies Recent Labs    12/16/17 2333  TSH 2.113   Anemia work up Recent Labs    12/16/17 2333  VITAMINB12 1,011*   Urinalysis    Component Value Date/Time   COLORURINE YELLOW 12/16/2017 1347   APPEARANCEUR CLOUDY (A) 12/16/2017 1347   LABSPEC 1.010 12/16/2017 1347   PHURINE 9.0 (H) 12/16/2017 1347   GLUCOSEU NEGATIVE 12/16/2017 1347   HGBUR NEGATIVE 12/16/2017 1347   [place] 12/16/2017 1347   KETONESUR NEGATIVE 12/16/2017 1347   PROTEINUR NEGATIVE 12/16/2017 1347   NITRITE NEGATIVE  12/16/2017 1347   LEUKOCYTESUR NEGATIVE 12/16/2017 1347   Sepsis Labs Invalid input(s): PROCALCITONIN,  WBC,  LACTICIDVEN Microbiology No results found for this or any previous visit (from the past 240 hour(s)).   Time coordinating discharge: 35 minutes  SIGNED:   Aline August, MD  [facility] 12/17/2017, 12:54 PM Pager: [phone]  If 7PM-7AM, please contact night-coverage [email or web address] Password [email or web address]

## 2017-12-17 NOTE — Progress Notes (Signed)
Subjective: This a.m. she denies any further spells.  She is aware that she is at the hospital but is unaware the name the hospital.  She could only name 4 animals in 1 minute.  She did slightly better as far as recall however she knew she is going to be asked to recall at 3 objects.  She was able to recall 2/3 objects.  Exam: Vitals:   12/17/17 0100 12/17/17 0500  BP: (!) 140/55 (!) 136/55  Pulse: 62 70  Resp:    Temp:    SpO2: 94% 97%    Physical Exam   HEENT-  Normocephalic, no lesions, without obvious abnormality.  Normal external eye and conjunctiva.   Cardiovascular- S1-S2 audible, pulses palpable throughout   Lungs-no rhonchi or wheezing noted, no excessive working breathing.  Saturations within normal limits Abdomen- All 4 quadrants palpated and nontender Extremities- Warm, dry and intact Musculoskeletal-no joint tenderness, deformity or swelling Skin-warm and dry, no hyperpigmentation, vitiligo, or suspicious lesions    Neuro:  Mental Status: Alert, oriented to hospital, month, year.Marland Kitchen  Speech fluent without evidence of aphasia.  Able to follow 3 step commands without difficulty. Cranial Nerves: II: Visual fields grossly normal,  III,IV, VI: ptosis not present, extra-ocular motions intact bilaterally pupils equal, round, reactive to light and accommodation V,VII: smile symmetric, facial light touch sensation normal bilaterally VIII: hearing normal bilaterally IX,X: uvula rises symmetrically XI: bilateral shoulder shrug XII: midline tongue extension Motor: Right : Upper extremity   5/5    Left:     Upper extremity   5/5  Lower extremity   5/5     Lower extremity   5/5 Tone and bulk:normal tone throughout; no atrophy noted Sensory: Pinprick and light touch intact throughout, bilaterally Deep Tendon Reflexes: 2+ and symmetric throughout Plantars: Right: downgoing   Left: downgoing Cerebellar: normal finger-to-nose,     Medications:  Scheduled: .  stroke: mapping  our early stages of recovery book   Does not apply Once  . escitalopram  5 mg Oral Daily  . ezetimibe-simvastatin  1 tablet Oral Daily  . furosemide  20 mg Oral Daily  . warfarin  7.5 mg Oral ONCE-1800  . Warfarin - Pharmacist Dosing Inpatient   Does not apply q1800   Continuous: . sodium chloride 75 mL/hr at 12/17/17 0500   ZOX:[id number] **OR** acetaminophen (TYLENOL) oral liquid 160 mg/5 mL **OR** acetaminophen, senna-docusate  Pertinent Labs/Diagnostics: EEG pending at this time MRI brain did not show any acute stroke or intracranial abnormalities other than chronic microvascular ischemic changes and parenchymal volume loss  Dg Chest 2 View  Result Date: 12/16/2017  IMPRESSION: No active cardiopulmonary disease. Electronically Signed   By: Titus Dubin M.D.   On: 12/16/2017 15:14   Ct Head Wo Contrast  Result Date: 12/16/2017  IMPRESSION: No acute finding by CT. Age related atrophy and chronic small-vessel ischemic changes. Atherosclerotic calcification of the major vessels at the base of the brain. Electronically Signed   By: Nelson Chimes M.D.   On: 12/16/2017 15:02   Mr Brain Wo Contrast  Result Date: 12/16/2017  IMPRESSION: 1. No acute intracranial abnormality identified. 2. Moderate chronic microvascular ischemic changes and parenchymal volume loss of brain. Electronically Signed   By: Kristine Garbe M.D.   On: 12/16/2017 20:50     Etta Quill PA-C [facility] [phone]  Impression: 84 year old female with recurrent episodes that sounds more like mild disorientation which is most consistent with underlying dementia.  Patient is awaiting EEG to  further evaluate for seizures or geographic activity.   Recommendations: 1) EEG which is pending    12/17/2017, 8:29 AM

## 2017-12-17 NOTE — Care Management Note (Signed)
Case Management Note  Patient Details  Name: Virginia Franklin MRN: [id number] Date of Birth: 08-02-1929  Subjective/Objective:                    Action/Plan: Pt discharging back to [facility]. [facility] for their inhouse therapy. CM called [facility] and faxed them the orders for the speech therapy.  Patients daughter to provide transportation back to the facility.  PCP: Dr Sarajane Jews Insurance: [employer]  Expected Discharge Date:  12/17/17               Expected Discharge Plan:  [place]  In-House Referral:     Discharge planning Services  CM Consult  Post Acute Care Choice:    Choice offered to:     DME Arranged:    DME Agency:     HH Arranged:  Speech Therapy [place] Agency:  Other - See comment([facility])  Status of Service:  Completed, signed off  If discussed at [place] of Stay Meetings, dates discussed:    Additional Comments:  Pollie Friar, RN 12/17/2017, 3:39 PM

## 2017-12-17 NOTE — Progress Notes (Signed)
Spoke with Dr. Starla Link for clarification regarding coumadin pt is to continue the same way prior to hospital admission.

## 2017-12-17 NOTE — Evaluation (Signed)
Speech Language Pathology Evaluation Patient Details Name: Virginia Franklin MRN: [id number] DOB: 1928/11/24 Today's Date: 12/17/2017 Time: 0920-0950 SLP Time Calculation (min) (ACUTE ONLY): 30 min  Problem List:  Patient Active Problem List   Diagnosis Date Noted  . Altered mental status 12/16/2017  . Spells of speech arrest   . HTN (hypertension) 10/15/2017  . Dyslipidemia 10/15/2017  . Hx of completed stroke 10/15/2017  . Osteoarthritis 10/15/2017  . Encounter for therapeutic drug monitoring 09/13/2017  . Chronic anticoagulation 09/05/2017  . Aortic valve replaced 09/05/2017   Past Medical History:  Past Medical History:  Diagnosis Date  . Body mass index (BMI) of 22.0-22.9 in adult   . Cervical cancer ([place])   . H/O cervical fracture   . H/O osteoporosis   . H/O rheumatoid arthritis   . H/O: CVA (cerebrovascular accident) 2012  . H/O: osteoarthritis   . History of urinary frequency   . Hx of diverticulitis of colon   . Hypertension    Past Surgical History:  Past Surgical History:  Procedure Laterality Date  . ABDOMINAL HYSTERECTOMY    . CARDIAC SURGERY    . CATARACT EXTRACTION    . SMALL INTESTINE SURGERY     HPI:  84 y.o. female with PMH significant of mechanical aortic valve on anticoagulation, HTN, HLD and prior CVA who was sent from Coumadin clinic because of unresponsive episode of 45 sec on 2/4. Head CT and CXR-unremarkable. Brain MRI-nothing acute. Admitted for TIA vs seizure. Pt and daughter report a recent cognitive decline.    Assessment / Plan / Recommendation Clinical Impression  Pt demosntrates cognitive impairment specifically in areas of working/short term memory and verbal and functional problem solving. She scored a 17 out of 30 on the [facility] Cognitive Assessment (normal 26 or better). Pt needed max assist in recalling complex directions and completing problem solving tasks, could not participate in any task that required pt to recall information  after 30 second delay without assist. Discussed with daughter and encouraged her to provide assistance for medication management, finances and appointments for safety, which she confirms she is doing for the most part. Given progressive cognitive decline in the recent past pt would benefit from home health SLP interventions to address compensatory strategies. Will follow acutely while admitted.     SLP Assessment  SLP Recommendation/Assessment: Patient needs continued Speech Lanaguage Pathology Services SLP Visit Diagnosis: Frontal lobe and executive function deficit    Follow Up Recommendations  Home health SLP    Frequency and Duration min 2x/week  2 weeks      SLP Evaluation Cognition  Overall Cognitive Status: Impaired/Different from baseline(recent) Arousal/Alertness: Awake/alert Orientation Level: Oriented to person;Oriented to place;Oriented to situation;Disoriented to time Attention: Focused;Sustained;Selective;Alternating Focused Attention: Appears intact Sustained Attention: Appears intact Selective Attention: Appears intact Alternating Attention: Impaired Memory: Impaired Memory Impairment: Storage deficit;Retrieval deficit Awareness: Appears intact Problem Solving: Impaired Problem Solving Impairment: Verbal complex;Functional complex Executive Function: Sequencing;Organizing;Initiating Sequencing: Impaired Sequencing Impairment: Functional complex Organizing: Impaired Organizing Impairment: Functional complex Initiating: Impaired Initiating Impairment: Functional complex Safety/Judgment: Appears intact       Comprehension  Auditory Comprehension Overall Auditory Comprehension: Appears within functional limits for tasks assessed    Expression Verbal Expression Overall Verbal Expression: Appears within functional limits for tasks assessed Written Expression Dominant Hand: Right   Oral / Motor  Oral Motor/Sensory Function Overall Oral Motor/Sensory Function:  Within functional limits Motor Speech Overall Motor Speech: Appears within functional limits for tasks assessed   GO  Herbie [place], [place] CCC-SLP [phone]  Lynann Beaver 12/17/2017, 10:01 AM

## 2017-12-17 NOTE — Evaluation (Signed)
Occupational Therapy Evaluation Patient Details Name: Virginia Franklin MRN: [id number] DOB: 1929/09/18 Today's Date: 12/17/2017    History of Present Illness 84 y.o. female with PMH significant of mechanical aortic valve on anticoagulation, HTN, HLD and prior CVA who was sent from Coumadin clinic because of unresponsive episode of 45 sec on 2/4. Head CT and CXR-unremarkable. Brain MRI-nothing acute. Admitted for TIA vs seizure.    Clinical Impression   PTA, pt was living at an ALF and was independent with ADLs; facility performing home management, cooking, and transportation. Pt currently performing ADLs and functional mobility at supervision level. Presenting with decreased ST memory and problem solving as seen during testing, ADLs, and trail making task. Feel pt close to baseline function. Recommend dc to home with initial 24 hour support when medically stable per physician. All acute OT needs met and will sign off.     Follow Up Recommendations  No OT follow up;Supervision/Assistance - 24 hour    Equipment Recommendations  None recommended by OT    Recommendations for Other Services PT consult;Speech consult     Precautions / Restrictions Precautions Precautions: Fall Restrictions Weight Bearing Restrictions: No      Mobility Bed Mobility Overal bed mobility: Modified Independent             General bed mobility comments: Increased time  Transfers Overall transfer level: Needs assistance Equipment used: None Transfers: Sit to/from Stand Sit to Stand: Supervision         General transfer comment: supervision for safety    Balance Overall balance assessment: Needs assistance Sitting-balance support: Feet supported;No upper extremity supported Sitting balance-Leahy Scale: Good     Standing balance support: During functional activity Standing balance-Leahy Scale: Fair                             ADL either performed or assessed with clinical  judgement   ADL Overall ADL's : Needs assistance/impaired                                       General ADL Comments: Pt performing ADLs and fucnitonal mobility at supervision level. Feel pt is close to baseline. Presenting with decreased cognition as seen during money management questions, ST memory testing, and trail making task. Pt requiring Mod VCs for completing trail making tasks and following signs for directions.      Vision Baseline Vision/History: Wears glasses Wears Glasses: Reading only Patient Visual Report: No change from baseline       Perception     Praxis      Pertinent Vitals/Pain Pain Assessment: No/denies pain Faces Pain Scale: No hurt Pain Location: right knee- chronic Pain Descriptors / Indicators: Aching;Discomfort Pain Intervention(s): Monitored during session;Repositioned     Hand Dominance Right   Extremity/Trunk Assessment Upper Extremity Assessment Upper Extremity Assessment: Generalized weakness   Lower Extremity Assessment Lower Extremity Assessment: Defer to PT evaluation   Cervical / Trunk Assessment Cervical / Trunk Assessment: Normal   Communication Communication Communication: Expressive difficulties(reports some word finding difficulties)   Cognition Arousal/Alertness: Awake/alert Behavior During Therapy: WFL for tasks assessed/performed Overall Cognitive Status: No family/caregiver present to determine baseline cognitive functioning(recent) Area of Impairment: Following commands;Memory;Problem solving                     Memory: Decreased short-term memory Following Commands:  Follows one step commands with increased time     Problem Solving: Slow processing;Requires verbal cues General Comments: Pt with slow processing and requires increased cues and time to answer questions.  Only able to recall 1/3 words for ST memory. When performing trail making task, pt requiring Mod-Max verbal cues to use signs and  locate room.   General Comments  Educated pt on stroke signs and symptoms    Exercises     Shoulder Instructions      Home Living Family/patient expects to be discharged to:: Assisted living Living Arrangements: Alone Available Help at Discharge: Available 24 hours/day Type of Home: Apartment Home Access: Elevator     Home Layout: One level     Bathroom Shower/Tub: Occupational psychologist: Standard     Home Equipment: Shower seat - built in   Additional Comments: [place]  Lives With: Alone    Prior Functioning/Environment Level of Independence: Independent        Comments: ADLs and light IADLs. Does not drive. Very active in facility community including excercises and painting classes. Facility performing cleaning, cooking, and meal prepration.        OT Problem List: Decreased activity tolerance;Impaired balance (sitting and/or standing);Decreased cognition      OT Treatment/Interventions:      OT Goals(Current goals can be found in the care plan section) Acute Rehab OT Goals Patient Stated Goal: to return to PLOF OT Goal Formulation: All assessment and education complete, DC therapy  OT Frequency:     Barriers to D/C:            Co-evaluation              AM-PAC PT "6 Clicks" Daily Activity     Outcome Measure Help from another person eating meals?: None Help from another person taking care of personal grooming?: None Help from another person toileting, which includes using toliet, bedpan, or urinal?: None Help from another person bathing (including washing, rinsing, drying)?: A Little Help from another person to put on and taking off regular upper body clothing?: None Help from another person to put on and taking off regular lower body clothing?: A Little 6 Click Score: 22   End of Session Equipment Utilized During Treatment: Gait belt Nurse Communication: Mobility status  Activity Tolerance: Patient tolerated treatment  well Patient left: in chair;with call bell/phone within reach;with chair alarm set(with PT)  OT Visit Diagnosis: Unsteadiness on feet (R26.81);Other symptoms and signs involving cognitive function;Muscle weakness (generalized) (M62.81)                Time: 0370-9643 OT Time Calculation (min): 22 min Charges:  OT General Charges $OT Visit: 1 Visit OT Evaluation $OT Eval Moderate Complexity: 1 Mod G-Codes:     [place], OTR/L Acute Rehab Pager: [phone] Office: [place] 12/17/2017, 10:51 AM

## 2017-12-17 NOTE — Progress Notes (Addendum)
ANTICOAGULATION CONSULT NOTE  Pharmacy Consult:  Coumadin Indication: history of CVA, mechanical aortic valve  Allergies  Allergen Reactions  . Penicillins Swelling    Has patient had a PCN reaction causing immediate rash, facial/tongue/throat swelling, SOB or lightheadedness with hypotension: Yes Has patient had a PCN reaction causing severe rash involving mucus membranes or skin necrosis: No Has patient had a PCN reaction that required hospitalization: No Has patient had a PCN reaction occurring within the last 10 years: No If all of the above answers are "NO", then may proceed with Cephalosporin use.    Patient Measurements: Height: 5\' 4"  (162.6 cm) Weight: 143 lb (64.9 kg) IBW/kg (Calculated) : 54.7  Vital Signs: Temp: 97.5 F (36.4 C) (02/04 2056) Temp Source: Oral (02/04 2056) BP: 136/55 (02/05 0500) Pulse Rate: 70 (02/05 0500)  Labs: Recent Labs    12/16/17 1146 12/16/17 1345 12/17/17 0550  HGB  --  13.3 12.1  HCT  --  40.3 37.8  PLT  --  213 203  LABPROT  --  21.7* 22.6*  INR 2.0 1.91 2.01  CREATININE  --  0.76 0.83    Estimated Creatinine Clearance: 42 mL/min (by C-G formula based on SCr of 0.83 mg/dL).    Assessment: Virginia Franklin presented from [facility] clinic with brief period of not being responsive, concerning for stroke or seizure.  MRI/CT on admit negative.  Neurology following.  Patient has a history of CVA and mechanical AVR to continue on Coumadin.  INR sub-therapeutic on admit and remains low today.  Coumadin dose not charted as given last night.  No bleeding reported.  PTA warfarin dose: 5mg  on Monday and Friday, 7.5mg  all other days   Goal of Therapy:  INR goal 2.5-3.5 Monitor platelets by anticoagulation protocol: Yes    Plan:  Coumadin 10mg  PO today Daily PT / INR F/u with the need to bridge given sub-therapeutic INR   Holdyn Poyser D. Mina Marble, PharmD, BCPS Pager:  [phone] 12/17/2017, 8:38 AM

## 2017-12-18 ENCOUNTER — Encounter: Payer: Self-pay | Admitting: Neurology

## 2017-12-18 ENCOUNTER — Telehealth: Payer: Self-pay | Admitting: Family Medicine

## 2017-12-18 ENCOUNTER — Encounter: Payer: Self-pay | Admitting: Family Medicine

## 2017-12-18 NOTE — Telephone Encounter (Signed)
The are supposed to be setting up a TCM visit with me and we can talk about this then

## 2017-12-18 NOTE — Telephone Encounter (Signed)
Transition Care Management Follow-up Telephone Call Virginia Franklin Fry VHQ:[id number] DOB: [birth year] DOA: 12/16/2017  PCP: [name], MD  Admit date: 12/16/2017 Discharge date: 12/17/2017  Admitted From: Home Disposition:  Home  Recommendations for Outpatient Follow-up:  1. Follow up with PCP in 1 week 2. Patient will benefit from outpatient neurology evaluation and follow-up   Home Health: Home SLP evaluation and follow-up Equipment/Devices: None  Discharge Condition: Stable CODE STATUS: DNR Diet recommendation: Heart Healthy   Brief/Interim Summary: 84 year old female with history of mechanical aortic valve on anticoagulation, hypertension, hyperlipidemia and prior CVA presented with brief episode of altered mental status on 12/16/2017.  Initial CT scan of the head was negative for acute infarct.  Neurology was consulted.  MRI of the brain was negative for acute stroke.  EEG was negative for any acute seizure.  Neurology has cleared the patient for discharge.  Neurology thinks the patient did not have TIA or stroke and he thinks that patient does not need carotid ultrasound or 2D echo.  Patient will benefit from outpatient evaluation and follow-up with neurology.    How have you been since you were released from the hospital?"feeling weak"   Do you understand why you were in the hospital? yes   Do you understand the discharge instructions? yes   Where were you discharged to? Home   Items Reviewed:  Medications reviewed: yes  Allergies reviewed: yes  Dietary changes reviewed: yes  Referrals reviewed: yes   Functional Questionnaire:   Activities of Daily Living (ADLs):   She states they are independent in the following: ambulation, bathing and hygiene, feeding, continence, grooming, toileting and dressing States they require assistance with the following: none   Any transportation issues/concerns?: no   Any patient concerns? no   Confirmed  importance and date/time of follow-up visits scheduled yes  Provider Appointment booked with Dr. Sarajane Jews on 12/25/2017 Wednesday at 3 pm  Confirmed with patient if condition begins to worsen call PCP or go to the ER.  Patient was given the office number and encouraged to call back with question or concerns.  : yes

## 2017-12-24 ENCOUNTER — Ambulatory Visit (INDEPENDENT_AMBULATORY_CARE_PROVIDER_SITE_OTHER): Payer: Medicare HMO | Admitting: *Deleted

## 2017-12-24 DIAGNOSIS — Z7901 Long term (current) use of anticoagulants: Secondary | ICD-10-CM

## 2017-12-24 DIAGNOSIS — Z952 Presence of prosthetic heart valve: Secondary | ICD-10-CM | POA: Diagnosis not present

## 2017-12-24 DIAGNOSIS — Z5181 Encounter for therapeutic drug level monitoring: Secondary | ICD-10-CM

## 2017-12-24 LAB — POCT INR: INR: 2.3

## 2017-12-24 NOTE — Patient Instructions (Addendum)
Description   Take an extra 2.5mg  today, then start taking coumadin 7.5mg  daily except 5mg  on Friday.  Recheck in 1 week. Coumadin Clinic [phone] Main [phone]

## 2017-12-25 ENCOUNTER — Ambulatory Visit (INDEPENDENT_AMBULATORY_CARE_PROVIDER_SITE_OTHER): Payer: Medicare HMO | Admitting: Family Medicine

## 2017-12-25 ENCOUNTER — Encounter: Payer: Self-pay | Admitting: Family Medicine

## 2017-12-25 VITALS — BP 140/72 | HR 71 | Temp 97.7°F | Wt 141.8 lb

## 2017-12-25 DIAGNOSIS — R4182 Altered mental status, unspecified: Secondary | ICD-10-CM | POA: Diagnosis not present

## 2017-12-25 NOTE — Progress Notes (Signed)
   Subjective:    Patient ID: Virginia Franklin, female    DOB: 03/22/1929, 84 y.o.   MRN: [id number]  HPI Here with her daughter to follow up a hospital stay from 12-16-17 to 12-17-17 for a transient mental status change. Her family noticed that she suddenly seemed confused the evening of admission, she had trouble responding to questions, and seemed disoriented. No weakness of the arms or legs, no headache, no slurred speech, etc. Her workup was entirely normal and included labs, a head CT, a brain MRI, and even an EEG. Neurology was consulted and they did not think this was the result of a TIA. No real explanation was found. Since then she has been back to her baseline, and she has felt fine. Her daughter agrees that she seems okay. However her daughter says this was the 4th or 5th such episode that she has had over the past 18 months.    Review of Systems  Constitutional: Negative.   Respiratory: Negative.   Cardiovascular: Negative.   Gastrointestinal: Negative.   Genitourinary: Negative.   Neurological: Negative for dizziness, tremors, seizures, syncope, facial asymmetry, speech difficulty, weakness, light-headedness, numbness and headaches.  Psychiatric/Behavioral: Negative.        Objective:   Physical Exam  Constitutional: She is oriented to person, place, and time. She appears well-developed and well-nourished.  Alert, neatly dressed   HENT:  Right Ear: External ear normal.  Left Ear: External ear normal.  Nose: Nose normal.  Mouth/Throat: Oropharynx is clear and moist.  Eyes: Pupils are equal, round, and reactive to light.  Neck: Normal range of motion. Neck supple. No thyromegaly present.  No bruits   Cardiovascular: Normal rate, regular rhythm and intact distal pulses.  She has her usual metallic click from her valve   Pulmonary/Chest: Effort normal and breath sounds normal. No respiratory distress. She has no wheezes. She has no rales.  Lymphadenopathy:    She has no  cervical adenopathy.  Neurological: She is alert and oriented to person, place, and time.          Assessment & Plan:  Reversible mental status change of uncertain etiology. I think TIA is still a possible etiology. We will set her up for carotid dopplers and will refer her to Neurology to evaluate  Alysia Penna, MD

## 2017-12-30 ENCOUNTER — Ambulatory Visit (HOSPITAL_COMMUNITY)
Admission: RE | Admit: 2017-12-30 | Discharge: 2017-12-30 | Disposition: A | Discharge status: Home / Self Care | Payer: Medicare HMO | Source: Ambulatory Visit | Attending: Cardiovascular Disease | Admitting: Cardiovascular Disease

## 2017-12-30 DIAGNOSIS — R4182 Altered mental status, unspecified: Secondary | ICD-10-CM | POA: Insufficient documentation

## 2017-12-30 DIAGNOSIS — I6523 Occlusion and stenosis of bilateral carotid arteries: Secondary | ICD-10-CM | POA: Diagnosis not present

## 2017-12-31 NOTE — Progress Notes (Signed)
Cardiology Office Note    Date:  01/01/2018   ID:  Genora Arp Barrett, DOB [birth year], MRN [id number]  PCP:  [name], MD  Cardiologist: Sinclair Grooms, MD   Chief Complaint  Patient presents with  . Cardiac Valve Problem  . Cerebrovascular Accident    History of Present Illness:  Virginia Franklin is a 84 y.o. female  here to establish for chronic longitudinal cardiology follow-up with a history of essential hypertension, embolic CVA, mechanical St. Jude aortic valve for rheumatic fever 1990, and chronicCoumadin therapy.   Admitted to [facility] from the office after experiencing a syncopal episode.  Concern was that of stroke.  The patient was seen by neurology consultation, had both CT and MRI performed without evidence of acute brain injury.  Discharged with instructions to have follow-up carotid Doppler and echocardiogram.  History of mechanical aortic valve.  Anticoagulation therapy without bleeding complications.  She feels her mental status is not stable and is the reason that she moved to [place] from [place].  She does not verbally as spontaneous and has difficulty with remembering.   Past Medical History:  Diagnosis Date  . Body mass index (BMI) of 22.0-22.9 in adult   . Cervical cancer ([place])   . H/O cervical fracture   . H/O osteoporosis   . H/O rheumatoid arthritis   . H/O: CVA (cerebrovascular accident) 2012  . H/O: osteoarthritis   . History of urinary frequency   . Hx of diverticulitis of colon   . Hypertension     Past Surgical History:  Procedure Laterality Date  . ABDOMINAL HYSTERECTOMY    . CARDIAC SURGERY    . CATARACT EXTRACTION    . SMALL INTESTINE SURGERY      Current Medications: Outpatient Medications Prior to Visit  Medication Sig Dispense Refill  . acetaminophen (TYLENOL) 650 MG CR tablet Take 650 mg by mouth every 8 (eight) hours as needed for pain.    . Calcium Citrate-Vitamin D (CALCIUM + D PO) Take 2 tablets  by mouth at bedtime.    Marland Kitchen escitalopram (LEXAPRO) 5 MG tablet Take 1 tablet (5 mg total) by mouth daily. 90 tablet 3  . ezetimibe-simvastatin (VYTORIN) 10-40 MG tablet Take 1 tablet by mouth daily. 90 tablet 0  . furosemide (LASIX) 20 MG tablet Take 1 tablet (20 mg total) by mouth daily. 90 tablet 0  . lisinopril (PRINIVIL,ZESTRIL) 10 MG tablet Take 2 tablets (20 mg total) by mouth at bedtime.    . Omega-3 Fatty Acids (FISH OIL) 1000 MG CAPS Take 2,000 mg by mouth every morning.    . TURMERIC PO Take 3 capsules by mouth every morning. Turmeric with biophrine 500mg     . vitamin B-12 (CYANOCOBALAMIN) 1000 MCG tablet Take 1,000 mcg by mouth daily.    Marland Kitchen warfarin (COUMADIN) 5 MG tablet Take 1-1.5 tablets (5-7.5 mg total) by mouth See admin instructions. Patient takes 5mg  twice weekly (on Mon and Fri) and 7.5mg  five times weekly (on Tues, Wed, Thurs, Sat, and Sun)     No facility-administered medications prior to visit.      Allergies:   Penicillins   Social History   Socioeconomic History  . Marital status: Widowed    Spouse name: None  . Number of children: None  . Years of education: None  . Highest education level: None  Social Needs  . Financial resource strain: None  . Food insecurity - worry: None  . Food insecurity - inability:  None  . Transportation needs - medical: None  . Transportation needs - non-medical: None  Occupational History  . None  Tobacco Use  . Smoking status: Former Research scientist (life sciences)  . Smokeless tobacco: Never Used  Substance and Sexual Activity  . Alcohol use: Yes    Comment: SOCIALLY  . Drug use: None  . Sexual activity: None    Comment: WIDOW  Other Topics Concern  . None  Social History Narrative  . None     Family History:  The patient's family history includes Arthritis in her father; Heart Problems in her father; Kidney disease in her mother; Lung disease in her brother.   ROS:   Please see the history of present illness.    Decreased memory.   Concerned that her blood pressure may be too high. All other systems reviewed and are negative.   PHYSICAL EXAM:   VS:  BP (!) 154/80   Pulse 71   Ht 5\' 4"  (1.626 m)   Wt 144 lb 12.8 oz (65.7 kg)   BMI 24.85 kg/m    GEN: Well nourished, well developed, in no acute distress  HEENT: normal  Neck: no JVD, carotid bruits, or masses Cardiac: Mechanical valve closure sound.  2/6 crescendo decrescendo right upper sternal border systolic murmur.  No diastolic murmur.  RRR; no edema. Respiratory:  clear to auscultation bilaterally, normal work of breathing GI: soft, nontender, nondistended, + BS MS: no deformity or atrophy  Skin: warm and dry, no rash Neuro:  Alert and Oriented x 3, Strength and sensation are intact Psych: euthymic mood, full affect  Wt Readings from Last 3 Encounters:  01/01/18 144 lb 12.8 oz (65.7 kg)  12/25/17 141 lb 12.8 oz (64.3 kg)  12/16/17 143 lb (64.9 kg)      Studies/Labs Reviewed:   EKG:  EKG the most recent study demonstrated normal sinus rhythm with normal appearance on 12/16/2017.  Recent Labs: 12/16/2017: TSH 2.113 12/17/2017: ALT 12; BUN 15; Creatinine, Ser 0.83; Hemoglobin 12.1; Magnesium 1.8; Platelets 203; Potassium 3.9; Sodium 136   Lipid Panel    Component Value Date/Time   CHOL 136 12/17/2017 0550   TRIG 45 12/17/2017 0550   HDL 59 12/17/2017 0550   CHOLHDL 2.3 12/17/2017 0550   VLDL 9 12/17/2017 0550   LDLCALC 68 12/17/2017 0550    Additional studies/ records that were reviewed today include:   Carotid Doppler 12/30/17:  Final Interpretation: Right Carotid: Velocities in the right ICA are consistent with a 1-39% stenosis.  Left Carotid: Velocities in the left ICA are consistent with a 1-39% stenosis.                 The bilateral internal carotid arteries are noted to be tortuous.  Vertebrals:  Both vertebral arteries were patent with antegrade flow. Subclavians: Normal flow hemodynamics were seen in bilateral subclavian               arteries.     ASSESSMENT:    1. Aortic valve replaced   2. LOC (loss of consciousness) ([place])   3. Chronic anticoagulation   4. Dyslipidemia   5. Essential hypertension   6. Hx of completed stroke      PLAN:  In order of problems listed above:  1. Apparent normal function.  2D Doppler echocardiogram to rule out thrombus/vegetation. 2. Etiology uncertain.  No obvious evidence of ischemic injury to the brain on imaging during recent hospital stay. 3. No bleeding complications. 4. LDL cholesterol less than 70  as the target and was achieved at the time of last evaluation on December 17, 2017 5. Blood pressure should be in the 140/85 mmHg range as a target.  Not lower than 122 mmHg systolic. 6. Not addressed.  Carotid Doppler has been performed and reveals no major concerns. Plan clinical follow-up in 8-12 months.  Encouraged patient to keep appointment with neurology.  We will contact her concerning echo results.  I doubt that valve dysfunction has anything to do with the recent episode.    Medication Adjustments/Labs and Tests Ordered: Current medicines are reviewed at length with the patient today.  Concerns regarding medicines are outlined above.  Medication changes, Labs and Tests ordered today are listed in the Patient Instructions below. Patient Instructions  Medication Instructions:  Your physician recommends that you continue on your current medications as directed. Please refer to the Current Medication list given to you today.   Labwork: None  Testing/Procedures: Your physician has requested that you have an echocardiogram. Echocardiography is a painless test that uses sound waves to create images of your heart. It provides your doctor with information about the size and shape of your heart and how well your heart's chambers and valves are working. This procedure takes approximately one hour. There are no restrictions for this procedure.   Follow-Up: Your physician  wants you to follow-up in: 9-12 months with Dr. Tamala Julian.  You will receive a reminder letter in the mail two months in advance. If you don't receive a letter, please call our office to schedule the follow-up appointment.   Any Other Special Instructions Will Be Listed Below (If Applicable).     If you need a refill on your cardiac medications before your next appointment, please call your pharmacy.      Signed, Sinclair Grooms, MD  01/01/2018 11:24 AM    [facility] [place], [place], [place]  [address] Phone: [phone]; Fax: [phone]

## 2018-01-01 ENCOUNTER — Encounter: Payer: Self-pay | Admitting: Interventional Cardiology

## 2018-01-01 ENCOUNTER — Ambulatory Visit (INDEPENDENT_AMBULATORY_CARE_PROVIDER_SITE_OTHER): Payer: Medicare HMO | Admitting: Interventional Cardiology

## 2018-01-01 ENCOUNTER — Ambulatory Visit (INDEPENDENT_AMBULATORY_CARE_PROVIDER_SITE_OTHER): Payer: Medicare HMO | Admitting: *Deleted

## 2018-01-01 VITALS — BP 154/80 | HR 71 | Ht 64.0 in | Wt 144.8 lb

## 2018-01-01 DIAGNOSIS — I10 Essential (primary) hypertension: Secondary | ICD-10-CM | POA: Diagnosis not present

## 2018-01-01 DIAGNOSIS — E785 Hyperlipidemia, unspecified: Secondary | ICD-10-CM

## 2018-01-01 DIAGNOSIS — R4020 Unspecified coma: Secondary | ICD-10-CM | POA: Diagnosis not present

## 2018-01-01 DIAGNOSIS — Z5181 Encounter for therapeutic drug level monitoring: Secondary | ICD-10-CM | POA: Diagnosis not present

## 2018-01-01 DIAGNOSIS — Z952 Presence of prosthetic heart valve: Secondary | ICD-10-CM

## 2018-01-01 DIAGNOSIS — Z8673 Personal history of transient ischemic attack (TIA), and cerebral infarction without residual deficits: Secondary | ICD-10-CM | POA: Diagnosis not present

## 2018-01-01 DIAGNOSIS — Z7901 Long term (current) use of anticoagulants: Secondary | ICD-10-CM

## 2018-01-01 LAB — POCT INR: INR: 3.5

## 2018-01-01 NOTE — Patient Instructions (Signed)
Medication Instructions:  Your physician recommends that you continue on your current medications as directed. Please refer to the Current Medication list given to you today.   Labwork: None  Testing/Procedures: Your physician has requested that you have an echocardiogram. Echocardiography is a painless test that uses sound waves to create images of your heart. It provides your doctor with information about the size and shape of your heart and how well your heart's chambers and valves are working. This procedure takes approximately one hour. There are no restrictions for this procedure.   Follow-Up: Your physician wants you to follow-up in: 9-12 months with Dr. Tamala Julian.  You will receive a reminder letter in the mail two months in advance. If you don't receive a letter, please call our office to schedule the follow-up appointment.   Any Other Special Instructions Will Be Listed Below (If Applicable).     If you need a refill on your cardiac medications before your next appointment, please call your pharmacy.

## 2018-01-01 NOTE — Patient Instructions (Signed)
Description   Continue taking coumadin 7.5mg  daily except 5mg  on Friday.  Recheck in 2 weeks. [facility] [phone] Main [phone]

## 2018-01-03 DIAGNOSIS — R41841 Cognitive communication deficit: Secondary | ICD-10-CM | POA: Diagnosis not present

## 2018-01-06 DIAGNOSIS — R41841 Cognitive communication deficit: Secondary | ICD-10-CM | POA: Diagnosis not present

## 2018-01-08 DIAGNOSIS — R41841 Cognitive communication deficit: Secondary | ICD-10-CM | POA: Diagnosis not present

## 2018-01-09 DIAGNOSIS — R41841 Cognitive communication deficit: Secondary | ICD-10-CM | POA: Diagnosis not present

## 2018-01-10 ENCOUNTER — Ambulatory Visit (HOSPITAL_COMMUNITY): Payer: Medicare HMO | Attending: Cardiology

## 2018-01-10 ENCOUNTER — Other Ambulatory Visit: Payer: Self-pay

## 2018-01-10 DIAGNOSIS — Z48812 Encounter for surgical aftercare following surgery on the circulatory system: Secondary | ICD-10-CM | POA: Insufficient documentation

## 2018-01-10 DIAGNOSIS — Z952 Presence of prosthetic heart valve: Secondary | ICD-10-CM

## 2018-01-10 DIAGNOSIS — Z8673 Personal history of transient ischemic attack (TIA), and cerebral infarction without residual deficits: Secondary | ICD-10-CM | POA: Diagnosis not present

## 2018-01-10 DIAGNOSIS — I10 Essential (primary) hypertension: Secondary | ICD-10-CM | POA: Diagnosis not present

## 2018-01-13 ENCOUNTER — Ambulatory Visit (INDEPENDENT_AMBULATORY_CARE_PROVIDER_SITE_OTHER): Payer: Medicare HMO | Admitting: Family Medicine

## 2018-01-13 ENCOUNTER — Encounter: Payer: Self-pay | Admitting: Family Medicine

## 2018-01-13 VITALS — BP 120/68 | HR 72 | Temp 98.1°F | Ht 64.0 in | Wt 144.2 lb

## 2018-01-13 DIAGNOSIS — I10 Essential (primary) hypertension: Secondary | ICD-10-CM

## 2018-01-13 DIAGNOSIS — Z7901 Long term (current) use of anticoagulants: Secondary | ICD-10-CM | POA: Diagnosis not present

## 2018-01-13 DIAGNOSIS — M159 Polyosteoarthritis, unspecified: Secondary | ICD-10-CM

## 2018-01-13 DIAGNOSIS — M150 Primary generalized (osteo)arthritis: Secondary | ICD-10-CM

## 2018-01-13 DIAGNOSIS — E785 Hyperlipidemia, unspecified: Secondary | ICD-10-CM | POA: Diagnosis not present

## 2018-01-13 DIAGNOSIS — Z8673 Personal history of transient ischemic attack (TIA), and cerebral infarction without residual deficits: Secondary | ICD-10-CM

## 2018-01-13 LAB — HEPATIC FUNCTION PANEL
ALBUMIN: 4 g/dL (ref 3.5–5.2)
ALK PHOS: 73 U/L (ref 39–117)
ALT: 11 U/L (ref 0–35)
AST: 16 U/L (ref 0–37)
Bilirubin, Direct: 0.2 mg/dL (ref 0.0–0.3)
TOTAL PROTEIN: 7.3 g/dL (ref 6.0–8.3)
Total Bilirubin: 0.7 mg/dL (ref 0.2–1.2)

## 2018-01-13 LAB — CBC WITH DIFFERENTIAL/PLATELET
Basophils Absolute: 0 10*3/uL (ref 0.0–0.1)
Basophils Relative: 0.8 % (ref 0.0–3.0)
EOS ABS: 0 10*3/uL (ref 0.0–0.7)
Eosinophils Relative: 0.9 % (ref 0.0–5.0)
HCT: 39.2 % (ref 36.0–46.0)
HEMOGLOBIN: 13.4 g/dL (ref 12.0–15.0)
LYMPHS ABS: 1.2 10*3/uL (ref 0.7–4.0)
Lymphocytes Relative: 27.1 % (ref 12.0–46.0)
MCHC: 34.1 g/dL (ref 30.0–36.0)
MCV: 88.6 fl (ref 78.0–100.0)
MONO ABS: 0.5 10*3/uL (ref 0.1–1.0)
Monocytes Relative: 11.8 % (ref 3.0–12.0)
NEUTROS ABS: 2.7 10*3/uL (ref 1.4–7.7)
NEUTROS PCT: 59.4 % (ref 43.0–77.0)
PLATELETS: 230 10*3/uL (ref 150.0–400.0)
RBC: 4.43 Mil/uL (ref 3.87–5.11)
RDW: 14.3 % (ref 11.5–15.5)
WBC: 4.6 10*3/uL (ref 4.0–10.5)

## 2018-01-13 LAB — POC URINALSYSI DIPSTICK (AUTOMATED)
Bilirubin, UA: NEGATIVE
GLUCOSE UA: NEGATIVE
Ketones, UA: NEGATIVE
Leukocytes, UA: NEGATIVE
Nitrite, UA: NEGATIVE
Protein, UA: NEGATIVE
SPEC GRAV UA: 1.015 (ref 1.010–1.025)
UROBILINOGEN UA: 0.2 U/dL
pH, UA: 7 (ref 5.0–8.0)

## 2018-01-13 LAB — LIPID PANEL
CHOLESTEROL: 149 mg/dL (ref 0–200)
HDL: 72.4 mg/dL (ref >39.00)
LDL Cholesterol: 64 mg/dL (ref 0–99)
NonHDL: 77.06
TRIGLYCERIDES: 63 mg/dL (ref 0.0–149.0)
Total CHOL/HDL Ratio: 2
VLDL: 12.6 mg/dL (ref 0.0–40.0)

## 2018-01-13 LAB — BASIC METABOLIC PANEL
BUN: 19 mg/dL (ref 6–23)
CO2: 31 meq/L (ref 19–32)
CREATININE: 0.77 mg/dL (ref 0.40–1.20)
Calcium: 10 mg/dL (ref 8.4–10.5)
Chloride: 100 mEq/L (ref 96–112)
GFR: 75.43 mL/min (ref >60.00)
GLUCOSE: 95 mg/dL (ref 70–99)
Potassium: 4 mEq/L (ref 3.5–5.1)
Sodium: 137 mEq/L (ref 135–145)

## 2018-01-13 LAB — TSH: TSH: 1.49 u[IU]/mL (ref 0.35–4.50)

## 2018-01-13 NOTE — Progress Notes (Signed)
   Subjective:    Patient ID: Virginia Franklin, female    DOB: 09-28-1929, 84 y.o.   MRN: [id number]  HPI Here to discuss several issues. She feels well in general. No more episodes of altered LOC. She is scheduled to see Dr. Delice Lesch of Neurology on 01-28-18. She has been on Lexapro for about 4 years and she wonders if she can stop it. This was started after the death of her husband, but she says she has worked through the grief process. She loves it here in [place] and she enjoys where she is living. Her BP is stable.    Review of Systems  Constitutional: Negative.   HENT: Negative.   Eyes: Negative.   Respiratory: Negative.   Cardiovascular: Negative.   Gastrointestinal: Negative.   Genitourinary: Negative for decreased urine volume, difficulty urinating, dyspareunia, dysuria, enuresis, flank pain, frequency, hematuria, pelvic pain and urgency.  Musculoskeletal: Negative.   Skin: Negative.   Neurological: Negative.   Psychiatric/Behavioral: Negative.        Objective:   Physical Exam  Constitutional: She is oriented to person, place, and time. She appears well-developed and well-nourished. No distress.  HENT:  Head: Normocephalic and atraumatic.  Right Ear: External ear normal.  Left Ear: External ear normal.  Nose: Nose normal.  Mouth/Throat: Oropharynx is clear and moist. No oropharyngeal exudate.  Eyes: Conjunctivae and EOM are normal. Pupils are equal, round, and reactive to light. No scleral icterus.  Neck: Normal range of motion. Neck supple. No JVD present. No thyromegaly present.  Cardiovascular: Normal rate, regular rhythm, normal heart sounds and intact distal pulses. Exam reveals no gallop and no friction rub.  No murmur heard. Pulmonary/Chest: Effort normal and breath sounds normal. No respiratory distress. She has no wheezes. She has no rales. She exhibits no tenderness.  Abdominal: Soft. Bowel sounds are normal. She exhibits no distension and no mass.  There is no tenderness. There is no rebound and no guarding.  Musculoskeletal: Normal range of motion. She exhibits no edema or tenderness.  Lymphadenopathy:    She has no cervical adenopathy.  Neurological: She is alert and oriented to person, place, and time. She has normal reflexes. No cranial nerve deficit. She exhibits normal muscle tone. Coordination normal.  Skin: Skin is warm and dry. No rash noted. No erythema.  Psychiatric: She has a normal mood and affect. Her behavior is normal. Judgment and thought content normal.          Assessment & Plan:  Her HTN is stable. She is fasting for labs so we can check her lipids, etc. No more changes in mentation, but she will see Neurology as above. Her moods are excellent so we will stop the Lexapro. She sees Cardiology regularly.  Alysia Penna, MD

## 2018-01-14 DIAGNOSIS — R41841 Cognitive communication deficit: Secondary | ICD-10-CM | POA: Diagnosis not present

## 2018-01-15 ENCOUNTER — Ambulatory Visit (INDEPENDENT_AMBULATORY_CARE_PROVIDER_SITE_OTHER): Payer: Medicare HMO | Admitting: *Deleted

## 2018-01-15 DIAGNOSIS — Z5181 Encounter for therapeutic drug level monitoring: Secondary | ICD-10-CM

## 2018-01-15 DIAGNOSIS — Z952 Presence of prosthetic heart valve: Secondary | ICD-10-CM

## 2018-01-15 DIAGNOSIS — R41841 Cognitive communication deficit: Secondary | ICD-10-CM | POA: Diagnosis not present

## 2018-01-15 DIAGNOSIS — Z7901 Long term (current) use of anticoagulants: Secondary | ICD-10-CM

## 2018-01-15 LAB — POCT INR: INR: 2.6

## 2018-01-15 NOTE — Patient Instructions (Signed)
Description   Continue taking coumadin 7.5mg  daily except 5mg  on Friday.  Recheck in 3 weeks. [facility] [phone] Main [phone]

## 2018-01-20 DIAGNOSIS — R41841 Cognitive communication deficit: Secondary | ICD-10-CM | POA: Diagnosis not present

## 2018-01-24 ENCOUNTER — Other Ambulatory Visit: Payer: Self-pay | Admitting: Family Medicine

## 2018-01-24 MED ORDER — LISINOPRIL 10 MG PO TABS: 20.0000 mg | ORAL_TABLET | Freq: Every day | ORAL | 0 refills | Status: DC

## 2018-01-24 NOTE — Telephone Encounter (Signed)
Faxed refill to pharmacy

## 2018-01-28 ENCOUNTER — Encounter: Payer: Self-pay | Admitting: Neurology

## 2018-01-28 ENCOUNTER — Ambulatory Visit (INDEPENDENT_AMBULATORY_CARE_PROVIDER_SITE_OTHER): Payer: Medicare HMO | Admitting: Neurology

## 2018-01-28 VITALS — BP 130/80 | HR 75 | Ht 64.0 in | Wt 143.2 lb

## 2018-01-28 DIAGNOSIS — F03A0 Unspecified dementia, mild, without behavioral disturbance, psychotic disturbance, mood disturbance, and anxiety: Secondary | ICD-10-CM

## 2018-01-28 DIAGNOSIS — F0390 Unspecified dementia without behavioral disturbance: Secondary | ICD-10-CM | POA: Diagnosis not present

## 2018-01-28 DIAGNOSIS — R69 Illness, unspecified: Secondary | ICD-10-CM | POA: Diagnosis not present

## 2018-01-28 DIAGNOSIS — R404 Transient alteration of awareness: Secondary | ICD-10-CM

## 2018-01-28 MED ORDER — DONEPEZIL HCL 10 MG PO TABS: ORAL_TABLET | ORAL | 3 refills | Status: DC

## 2018-01-28 MED ORDER — DONEPEZIL HCL 10 MG PO TABS
ORAL_TABLET | ORAL | 6 refills | Status: DC
Start: 2018-01-28 — End: 2018-01-28

## 2018-01-28 NOTE — Patient Instructions (Addendum)
1. Schedule 24-hour EEG 2. Start Donepezil 10mg : Take 1/2 tablet daily for 2 weeks, then increase to 1 tablet daily 3. Schedule Neurocognitive testing 4. Follow-up after tests, call for any changes  You have been referred for a neurocognitive evaluation in our office.   The evaluation takes approximately two hours. The first part of the appointment is a clinical interview with the neuropsychologist (Dr. Macarthur Critchley). Please bring someone with you to this appointment if possible, as it is helpful for Dr. Si Raider to hear from both you and another adult who knows you well. After speaking with Dr. Si Raider, you will complete testing with her technician. The testing includes a variety of tasks- mostly question-and-answer, some paper-and-pencil. There is nothing you need to do to prepare for this appointment, but having a good night's sleep prior to the testing, and bringing eyeglasses and hearing aids (if you wear them), is advised.   About a week after the evaluation, you will return to follow up with Dr. Si Raider to review the test results. This appointment is about 30 minutes. If you would like a family member to receive this information as well, please bring them to the appointment.   We have to reserve several hours of the neuropsychologist's time and the psychometrician's time for your evaluation appointment. As such, please note that there is a No-Show fee of $100. If you are unable to attend any of your appointments, please contact our office as soon as possible to reschedule.

## 2018-01-28 NOTE — Progress Notes (Signed)
NEUROLOGY CONSULTATION NOTE  Virginia Franklin MRN: [id number] DOB: Oct 20, 1929  Referring provider: Dr. Aline August Primary care provider: Dr. Alysia Penna  Reason for consult:  Altered mental status  Dear Dr Remi Haggard:  Thank you for your kind referral of Virginia Franklin for consultation of the above symptoms. Although her history is well known to you, please allow me to reiterate it for the purpose of our medical record. The patient was accompanied to the clinic by her daughter who also provides collateral information. Records and images were personally reviewed where available.  HISTORY OF PRESENT ILLNESS: This is an 84 year old right-handed woman with a history of aortic valve replacement on anticoagulation with Coumadin, hypertension, cervical cancer, presenting after hospitalization on 12/16/17 for altered mental status.  Records were reviewed. Patient had reported that a few days prior to admission, she had an episode where she was unable to recall different things in the house. This lasted for a few hours then resolved. On 12/16/17, she was at the Coumadin clinic having her INR checked, when she had an episode where she was reported to zone out and was non-verbal. She did not recall the episode. She recalls having her finger pricked, she denies feeling dizzy or any other warning symptoms. Per notes it lasted 45 seconds, but her daughter reports she was still not back to baseline when she arrived in the hospital. She could not say what year it was or who the president is. Her daughter had reported that over the past 2 years, she has had around 5 episodes of confusion and inability to recall events. She saw a neurologist in [place], per daughter workup showed electrolyte abnormalities and prior strokes. These usually occur upon awakening, it takes her 20-30 minutes to get her bearings. Her daughter has not witnessed them, but her son had reported that she was not quite herself. She  drank her electrolyte drink and within 20 minutes was back to normal. At [facility], she had an MRI brain without contrast which I personally reviewed, no acute changes, there was moderate chronic microvascular disease and volume loss. Her wake and sleep EEG was within normal limits. It was felt that she had some underlying dementia and Aricept was recommended, she has not started this.  She moved to [facility] in October 2018 and has been living in a retirement community at [employer]. Her daughter reported progressive memory problems over the past 5-7 years, worse in the past couple of years. She has gotten lost driving a couple of times and has stopped driving since moving to [place]. She feels her memory is not good, she is forgetting quite a bit. Her daughter took over her finances in November 2018. She manages her own medications, although her daughter reports that one time she checked and a couple of things were off with her medication, she missed an entire day of her pillbox. Her daughter comes twice a week to visit.   She has occasional vertigo from inner ear issues. She has occasional trouble swallowing, neck and back pain, urinary incontinence, constipation. She has occasional tremor on her right hand when painting. She denies any headaches, dizziness, diplopia, dysarthria, focal numbness/tingling/weakness, anosmia. She still feels that her thinking is not straight. She is still forgetting things and repeating conversations. Se denies any olfactory/gustatory hallucinations, deja vu, rising epigastric sensation, focal numbness/tingling/weakness, myoclonic jerks. She had a normal birth and early development.  There is no history of febrile convulsions, CNS infections such as meningitis/encephalitis, significant  traumatic brain injury, neurosurgical procedures, or family history of seizures.  PAST MEDICAL HISTORY: Past Medical History:  Diagnosis Date  . Body mass index (BMI) of 22.0-22.9 in adult   . Cervical  cancer ([place])   . H/O cervical fracture   . H/O osteoporosis   . H/O rheumatoid arthritis   . H/O: CVA (cerebrovascular accident) 2012  . H/O: osteoarthritis   . History of urinary frequency   . Hx of diverticulitis of colon   . Hypertension     PAST SURGICAL HISTORY: Past Surgical History:  Procedure Laterality Date  . ABDOMINAL HYSTERECTOMY    . CARDIAC SURGERY    . CATARACT EXTRACTION    . SMALL INTESTINE SURGERY      MEDICATIONS: Current Outpatient Medications on File Prior to Visit  Medication Sig Dispense Refill  . acetaminophen (TYLENOL) 650 MG CR tablet Take 650 mg by mouth every 8 (eight) hours as needed for pain.    . Calcium Citrate-Vitamin D (CALCIUM + D PO) Take 2 tablets by mouth at bedtime.    Marland Kitchen ezetimibe-simvastatin (VYTORIN) 10-40 MG tablet Take 1 tablet by mouth daily. 90 tablet 0  . furosemide (LASIX) 20 MG tablet Take 1 tablet (20 mg total) by mouth daily. 90 tablet 0  . lisinopril (PRINIVIL,ZESTRIL) 10 MG tablet TAKE 1 TABLET DAILY 90 tablet 0  . lisinopril (PRINIVIL,ZESTRIL) 10 MG tablet Take 2 tablets (20 mg total) by mouth at bedtime. 180 tablet 0  . Omega-3 Fatty Acids (FISH OIL) 1000 MG CAPS Take 2,000 mg by mouth every morning.    . TURMERIC PO Take 3 capsules by mouth every morning. Turmeric with biophrine 500mg     . vitamin B-12 (CYANOCOBALAMIN) 1000 MCG tablet Take 1,000 mcg by mouth daily.    Marland Kitchen warfarin (COUMADIN) 5 MG tablet Take 1-1.5 tablets (5-7.5 mg total) by mouth See admin instructions. Patient takes 5mg  twice weekly (on Mon and Fri) and 7.5mg  five times weekly (on Tues, Wed, Thurs, Sat, and Sun)     No current facility-administered medications on file prior to visit.     ALLERGIES: Allergies  Allergen Reactions  . Penicillins Swelling    Has patient had a PCN reaction causing immediate rash, facial/tongue/throat swelling, SOB or lightheadedness with hypotension: Yes Has patient had a PCN reaction causing severe rash involving mucus  membranes or skin necrosis: No Has patient had a PCN reaction that required hospitalization: No Has patient had a PCN reaction occurring within the last 10 years: No If all of the above answers are "NO", then may proceed with Cephalosporin use.    FAMILY HISTORY: Family History  Problem Relation Age of Onset  . Kidney disease Mother        MALIGNANT NEOPLASM  . Arthritis Father   . Heart Problems Father   . Lung disease Brother   . Heart disease Neg Hx     SOCIAL HISTORY: Social History   Socioeconomic History  . Marital status: Widowed    Spouse name: Not on file  . Number of children: Not on file  . Years of education: Not on file  . Highest education level: Not on file  Social Needs  . Financial resource strain: Not on file  . Food insecurity - worry: Not on file  . Food insecurity - inability: Not on file  . Transportation needs - medical: Not on file  . Transportation needs - non-medical: Not on file  Occupational History  . Not on file  Tobacco Use  .  Smoking status: Former Research scientist (life sciences)  . Smokeless tobacco: Never Used  Substance and Sexual Activity  . Alcohol use: Yes    Comment: SOCIALLY  . Drug use: Not on file  . Sexual activity: Not on file    Comment: WIDOW  Other Topics Concern  . Not on file  Social History Narrative  . Not on file    REVIEW OF SYSTEMS: Constitutional: No fevers, chills, or sweats, no generalized fatigue, change in appetite Eyes: No visual changes, double vision, eye pain Ear, nose and throat: No hearing loss, ear pain, nasal congestion, sore throat Cardiovascular: No chest pain, palpitations Respiratory:  No shortness of breath at rest or with exertion, wheezes GastrointestinaI: No nausea, vomiting, diarrhea, abdominal pain, fecal incontinence Genitourinary:  No dysuria, urinary retention or frequency Musculoskeletal:  +neck pain, back pain Integumentary: No rash, pruritus, skin lesions Neurological: as above Psychiatric: No  depression, insomnia, anxiety Endocrine: No palpitations, fatigue, diaphoresis, mood swings, change in appetite, change in weight, increased thirst Hematologic/Lymphatic:  No anemia, purpura, petechiae. Allergic/Immunologic: no itchy/runny eyes, nasal congestion, recent allergic reactions, rashes  PHYSICAL EXAM: Vitals:   01/28/18 1300  BP: 130/80  Pulse: 75  SpO2: 96%   General: No acute distress Head:  Normocephalic/atraumatic Eyes: Fundoscopic exam shows bilateral sharp discs, no vessel changes, exudates, or hemorrhages Neck: supple, no paraspinal tenderness, full range of motion Back: No paraspinal tenderness Heart: regular rate and rhythm Lungs: Clear to auscultation bilaterally. Vascular: No carotid bruits. Skin/Extremities: No rash, no edema Neurological Exam: Mental status: alert and oriented to person, place, and time, no dysarthria or aphasia, Fund of knowledge is appropriate.  Remote memory intact.  Attention and concentration are normal.    Able to name objects and repeat phrases.  Montreal Cognitive Assessment  01/28/2018  Visuospatial/ Executive (0/5) 5  Naming (0/3) 3  Attention: Read list of digits (0/2) 1  Attention: Read list of letters (0/1) 1  Attention: Serial 7 subtraction starting at 100 (0/3) 3  Language: Repeat phrase (0/2) 0  Language : Fluency (0/1) 0  Abstraction (0/2) 1  Delayed Recall (0/5) 2  Orientation (0/6) 4  Total 20   Cranial nerves: CN I: not tested CN II: pupils equal, round and reactive to light, visual fields intact, fundi unremarkable. CN III, IV, VI:  full range of motion, no nystagmus, no ptosis CN V: facial sensation intact CN VII: upper and lower face symmetric CN VIII: hearing intact to finger rub CN IX, X: gag intact, uvula midline CN XI: sternocleidomastoid and trapezius muscles intact CN XII: tongue midline Bulk & Tone: normal, no cogwheeling, no fasciculations. Motor: 5/5 throughout with no pronator drift. Sensation:  intact to light touch, cold, pin, vibration and joint position sense.  No extinction to double simultaneous stimulation.  Romberg test negative Deep Tendon Reflexes: +2 throughout, no ankle clonus Plantar responses: downgoing bilaterally Cerebellar: no incoordination on finger to nose, heel to shin. No dysdiadochokinesia Gait: slow and cautious with bilateral knee pain, unable to tandem walk Tremor: none  IMPRESSION: This is an 84 year old right-handed woman with a history of aortic valve replacement on anticoagulation with Coumadin, hypertension, cervical cancer, presenting after hospitalization on 12/16/17 for altered mental status. She had an episode of unresponsiveness during INR check. Her daughter reports at least 5 episodes of transient confusion over the past 2 years. Her neurological exam is non-focal, MOCA score today 20/30, indicating mild dementia. MRI brain and routine EEG unremarkable. Etiology of confusional/unresponsive episodes unclear, a 24-hour EEG  will be done to further classify these episodes. She is agreeable to starting Donepezil for dementia, side effects were discussed. We discussed doing Neurocognitive testing to further evaluate memory concerns. She does not drive. She will follow-up after the tests and knows to call for any changes.   Thank you for allowing me to participate in the care of this patient. Please do not hesitate to call for any questions or concerns.   Ellouise Newer, M.D.  CC: Dr. Sarajane Jews, Dr. Remi Haggard

## 2018-01-29 DIAGNOSIS — R41841 Cognitive communication deficit: Secondary | ICD-10-CM | POA: Diagnosis not present

## 2018-01-30 ENCOUNTER — Ambulatory Visit: Payer: Medicare HMO | Admitting: Podiatry

## 2018-01-30 DIAGNOSIS — M6281 Muscle weakness (generalized): Secondary | ICD-10-CM | POA: Diagnosis not present

## 2018-01-30 DIAGNOSIS — R262 Difficulty in walking, not elsewhere classified: Secondary | ICD-10-CM | POA: Diagnosis not present

## 2018-01-30 DIAGNOSIS — Z9181 History of falling: Secondary | ICD-10-CM | POA: Diagnosis not present

## 2018-02-03 DIAGNOSIS — M6281 Muscle weakness (generalized): Secondary | ICD-10-CM | POA: Diagnosis not present

## 2018-02-03 DIAGNOSIS — Z9181 History of falling: Secondary | ICD-10-CM | POA: Diagnosis not present

## 2018-02-03 DIAGNOSIS — R262 Difficulty in walking, not elsewhere classified: Secondary | ICD-10-CM | POA: Diagnosis not present

## 2018-02-05 ENCOUNTER — Ambulatory Visit (INDEPENDENT_AMBULATORY_CARE_PROVIDER_SITE_OTHER): Payer: Medicare HMO | Admitting: *Deleted

## 2018-02-05 DIAGNOSIS — Z5181 Encounter for therapeutic drug level monitoring: Secondary | ICD-10-CM

## 2018-02-05 DIAGNOSIS — Z8673 Personal history of transient ischemic attack (TIA), and cerebral infarction without residual deficits: Secondary | ICD-10-CM | POA: Diagnosis not present

## 2018-02-05 DIAGNOSIS — Z952 Presence of prosthetic heart valve: Secondary | ICD-10-CM | POA: Diagnosis not present

## 2018-02-05 DIAGNOSIS — Z7901 Long term (current) use of anticoagulants: Secondary | ICD-10-CM

## 2018-02-05 DIAGNOSIS — R41841 Cognitive communication deficit: Secondary | ICD-10-CM | POA: Diagnosis not present

## 2018-02-05 LAB — POCT INR: INR: 3.5

## 2018-02-05 NOTE — Patient Instructions (Signed)
Description   Continue taking coumadin 7.5mg  daily except 5mg  on Friday.  Recheck in 4 weeks. [facility] [phone] Main [phone] Try to do 2 servings of greens weekly

## 2018-02-07 DIAGNOSIS — R41841 Cognitive communication deficit: Secondary | ICD-10-CM | POA: Diagnosis not present

## 2018-02-08 ENCOUNTER — Other Ambulatory Visit: Payer: Self-pay | Admitting: Family Medicine

## 2018-02-10 ENCOUNTER — Other Ambulatory Visit: Payer: Self-pay | Admitting: Pharmacist

## 2018-02-10 DIAGNOSIS — R41841 Cognitive communication deficit: Secondary | ICD-10-CM | POA: Diagnosis not present

## 2018-02-10 MED ORDER — WARFARIN SODIUM 5 MG PO TABS: ORAL_TABLET | ORAL | 3 refills | Status: DC

## 2018-02-10 MED ORDER — WARFARIN SODIUM 7.5 MG PO TABS: ORAL_TABLET | ORAL | 3 refills | Status: DC

## 2018-02-10 NOTE — Telephone Encounter (Signed)
Please refill.

## 2018-02-11 DIAGNOSIS — R262 Difficulty in walking, not elsewhere classified: Secondary | ICD-10-CM | POA: Diagnosis not present

## 2018-02-11 DIAGNOSIS — Z9181 History of falling: Secondary | ICD-10-CM | POA: Diagnosis not present

## 2018-02-11 DIAGNOSIS — M6281 Muscle weakness (generalized): Secondary | ICD-10-CM | POA: Diagnosis not present

## 2018-02-17 DIAGNOSIS — R41841 Cognitive communication deficit: Secondary | ICD-10-CM | POA: Diagnosis not present

## 2018-02-19 DIAGNOSIS — R41841 Cognitive communication deficit: Secondary | ICD-10-CM | POA: Diagnosis not present

## 2018-02-21 ENCOUNTER — Telehealth: Payer: Self-pay | Admitting: Neurology

## 2018-02-21 NOTE — Telephone Encounter (Signed)
Called and spoke with Pt. She wanted to know when the EEG would be scheduled. I advsd her I will have our technician call her on Monday to schedule.

## 2018-02-21 NOTE — Telephone Encounter (Signed)
Patient called and Lmom regarding the EEG test that she will be having. She would like to speak with some one regarding that test and what it is. Please Call. Thanks

## 2018-02-26 DIAGNOSIS — R41841 Cognitive communication deficit: Secondary | ICD-10-CM | POA: Diagnosis not present

## 2018-02-28 ENCOUNTER — Other Ambulatory Visit: Payer: Self-pay | Admitting: Family Medicine

## 2018-03-04 ENCOUNTER — Ambulatory Visit: Payer: Medicare HMO | Admitting: Neurology

## 2018-03-05 ENCOUNTER — Ambulatory Visit (INDEPENDENT_AMBULATORY_CARE_PROVIDER_SITE_OTHER): Payer: Medicare HMO | Admitting: *Deleted

## 2018-03-05 DIAGNOSIS — Z5181 Encounter for therapeutic drug level monitoring: Secondary | ICD-10-CM | POA: Diagnosis not present

## 2018-03-05 DIAGNOSIS — Z952 Presence of prosthetic heart valve: Secondary | ICD-10-CM | POA: Diagnosis not present

## 2018-03-05 DIAGNOSIS — Z7901 Long term (current) use of anticoagulants: Secondary | ICD-10-CM

## 2018-03-05 LAB — POCT INR: INR: 1.9

## 2018-03-05 NOTE — Patient Instructions (Addendum)
Description   When you get home take 2.5mg  which is 1/2 tablet of 5mg  (peach tablet) then continue taking coumadin 7.5mg  daily except 5mg  on Friday.  Recheck in 2 weeks. [facility]in Clinic [phone] Main [phone] Try to do 2 servings of greens weekly

## 2018-03-18 ENCOUNTER — Ambulatory Visit (INDEPENDENT_AMBULATORY_CARE_PROVIDER_SITE_OTHER): Payer: Medicare HMO | Admitting: *Deleted

## 2018-03-18 DIAGNOSIS — Z952 Presence of prosthetic heart valve: Secondary | ICD-10-CM | POA: Diagnosis not present

## 2018-03-18 DIAGNOSIS — Z5181 Encounter for therapeutic drug level monitoring: Secondary | ICD-10-CM | POA: Diagnosis not present

## 2018-03-18 DIAGNOSIS — Z8673 Personal history of transient ischemic attack (TIA), and cerebral infarction without residual deficits: Secondary | ICD-10-CM

## 2018-03-18 DIAGNOSIS — Z7901 Long term (current) use of anticoagulants: Secondary | ICD-10-CM | POA: Diagnosis not present

## 2018-03-18 LAB — POCT INR: INR: 2.3

## 2018-03-18 NOTE — Patient Instructions (Addendum)
Description   When you get home take 2.5mg  which is 1/2 tablet of 5mg  (peach tablet) then change coumadin dose to  7.5mg  daily .  Recheck in 2 weeks. Coumadin Clinic [phone] Main [phone] Try to do 2 servings of greens weekly

## 2018-03-19 ENCOUNTER — Ambulatory Visit (INDEPENDENT_AMBULATORY_CARE_PROVIDER_SITE_OTHER): Payer: Medicare HMO | Admitting: Neurology

## 2018-03-19 DIAGNOSIS — F0390 Unspecified dementia without behavioral disturbance: Secondary | ICD-10-CM

## 2018-03-19 DIAGNOSIS — R404 Transient alteration of awareness: Secondary | ICD-10-CM

## 2018-03-19 DIAGNOSIS — R69 Illness, unspecified: Secondary | ICD-10-CM | POA: Diagnosis not present

## 2018-03-19 DIAGNOSIS — F03A0 Unspecified dementia, mild, without behavioral disturbance, psychotic disturbance, mood disturbance, and anxiety: Secondary | ICD-10-CM

## 2018-03-23 ENCOUNTER — Other Ambulatory Visit: Payer: Self-pay | Admitting: Family Medicine

## 2018-04-01 ENCOUNTER — Ambulatory Visit (INDEPENDENT_AMBULATORY_CARE_PROVIDER_SITE_OTHER): Payer: Medicare HMO | Admitting: *Deleted

## 2018-04-01 DIAGNOSIS — Z5181 Encounter for therapeutic drug level monitoring: Secondary | ICD-10-CM | POA: Diagnosis not present

## 2018-04-01 DIAGNOSIS — Z7901 Long term (current) use of anticoagulants: Secondary | ICD-10-CM

## 2018-04-01 DIAGNOSIS — Z952 Presence of prosthetic heart valve: Secondary | ICD-10-CM

## 2018-04-01 LAB — POCT INR: INR: 1.9 — AB (ref 2.0–3.0)

## 2018-04-01 NOTE — Procedures (Signed)
ELECTROENCEPHALOGRAM REPORT  Dates of Recording: 03/19/2018 11/36AM to 03/20/2018 11:41AM  Patient's Name: Virginia Franklin [facility] MRN: [id number] Date of Birth: 12/13/1928  Referring Provider: Dr. Ellouise Newer  Procedure: 24-hour ambulatory EEG  History: This is an 84 year old woman with episodes of transient confusion. EEG for classification.  Medications:  TYLENOL 650 MG CR tablet   CALCIUM + D PO   VYTORIN 10-40 MG tablet  LASIX 20 MG tablet  PRINIVIL,ZESTRIL 10 MG tablet  FISH OIL 1000 MG CAPS   Turmeric with biophrine 500mg    CYANOCOBALAMIN 1000 MCG tablet  COUMADIN 5 MG tablet   Technical Summary: This is a 24-hour multichannel digital EEG recording measured by the international 10-20 system with electrodes applied with paste and impedances below 5000 ohms performed as portable with EKG monitoring.  The digital EEG was referentially recorded, reformatted, and digitally filtered in a variety of bipolar and referential montages for optimal display.    DESCRIPTION OF RECORDING: During maximal wakefulness, the background activity consisted of a symmetric 8 Hz posterior dominant rhythm which was reactive to eye opening.  There were no epileptiform discharges or focal slowing seen in wakefulness.  During the recording, the patient progresses through wakefulness, drowsiness, and Stage 2 sleep.  Again, there were no epileptiform discharges seen.  Events: On 5/8 at 1120 hours, she feels a little dizzy. Electrographically, there were no EEG or EKG changes seen.  There were no electrographic seizures seen.  EKG lead showed irregular rhythm.  IMPRESSION: This 24-hour ambulatory EEG study is normal.    CLINICAL CORRELATION: A normal EEG does not exclude a clinical diagnosis of epilepsy.  If further clinical questions remain, inpatient video EEG monitoring may be helpful.   Ellouise Newer, M.D.

## 2018-04-01 NOTE — Patient Instructions (Signed)
Description   Today take an extra 2.5mg , then change your dose to 7.5mg  daily except 10mg  on Tuesdays and Saturdays.  Recheck in 2 weeks. [facility]madin Clinic [phone] Main [phone] Try to do 2 servings of greens weekly

## 2018-04-03 ENCOUNTER — Telehealth: Payer: Self-pay

## 2018-04-03 NOTE — Telephone Encounter (Signed)
Spoke with pt relaying message below.   

## 2018-04-03 NOTE — Telephone Encounter (Signed)
-----   Message from Cameron Sprang, MD sent at 04/01/2018  1:07 PM EDT ----- Pls let daughter know the brain wave test was normal, no evidence of seizures seen. Would proceed with the Neurocognitive testing as discussed. Thanks

## 2018-04-11 ENCOUNTER — Encounter: Payer: Self-pay | Admitting: Family Medicine

## 2018-04-14 NOTE — Telephone Encounter (Signed)
Spoke with pt and she states she did not send that message. She thinks her daughter may have sent it. Discussed options with pt, she has scheduled OV with Dr. Sarajane Jews 04/23/18 @ 1pm. Pt will need to complete MyChart Proxy Access form.

## 2018-04-15 ENCOUNTER — Ambulatory Visit (INDEPENDENT_AMBULATORY_CARE_PROVIDER_SITE_OTHER): Payer: Medicare HMO

## 2018-04-15 DIAGNOSIS — Z5181 Encounter for therapeutic drug level monitoring: Secondary | ICD-10-CM | POA: Diagnosis not present

## 2018-04-15 DIAGNOSIS — Z7901 Long term (current) use of anticoagulants: Secondary | ICD-10-CM | POA: Diagnosis not present

## 2018-04-15 DIAGNOSIS — Z952 Presence of prosthetic heart valve: Secondary | ICD-10-CM | POA: Diagnosis not present

## 2018-04-15 LAB — POCT INR: INR: 3.4 — AB (ref 2.0–3.0)

## 2018-04-15 MED ORDER — WARFARIN SODIUM 10 MG PO TABS: ORAL_TABLET | ORAL | 0 refills | Status: DC

## 2018-04-15 NOTE — Patient Instructions (Signed)
Description   Continue on same dosage 7.5mg  daily except 10mg  on Tuesdays and Saturdays.  Recheck in 2 weeks. Coumadin Clinic [phone] Main [phone].

## 2018-04-23 ENCOUNTER — Ambulatory Visit (INDEPENDENT_AMBULATORY_CARE_PROVIDER_SITE_OTHER): Payer: Medicare HMO | Admitting: Family Medicine

## 2018-04-23 ENCOUNTER — Encounter: Payer: Self-pay | Admitting: Family Medicine

## 2018-04-23 VITALS — BP 136/74 | HR 55 | Temp 98.1°F | Ht 64.0 in | Wt 142.8 lb

## 2018-04-23 DIAGNOSIS — I10 Essential (primary) hypertension: Secondary | ICD-10-CM

## 2018-04-23 DIAGNOSIS — M150 Primary generalized (osteo)arthritis: Secondary | ICD-10-CM

## 2018-04-23 DIAGNOSIS — M159 Polyosteoarthritis, unspecified: Secondary | ICD-10-CM

## 2018-04-23 MED ORDER — TRAMADOL HCL 50 MG PO TABS: 100.0000 mg | ORAL_TABLET | Freq: Four times a day (QID) | ORAL | 2 refills | Status: DC | PRN

## 2018-04-23 NOTE — Progress Notes (Signed)
   Subjective:    Patient ID: Virginia Franklin, female    DOB: 04/18/1929, 84 y.o.   MRN: [id number]  HPI Here with her daughter to discuss several issues. First she wants to enter a DNR into her chart. She had this in effect when living in [place]. Also she complains of worsening joint pains, primarily in the shoulder and knees. She takes Tylenol with little benefit. She had steroid injections to the knees a few years ago, and this was helpful.    Review of Systems  Constitutional: Negative.   Respiratory: Negative.   Cardiovascular: Negative.   Musculoskeletal: Positive for arthralgias.  Neurological: Negative.        Objective:   Physical Exam  Constitutional: She is oriented to person, place, and time. She appears well-developed and well-nourished.  Cardiovascular: Normal rate, regular rhythm, normal heart sounds and intact distal pulses.  Pulmonary/Chest: Effort normal and breath sounds normal.  Neurological: She is alert and oriented to person, place, and time.          Assessment & Plan:  For the osteoarthritis, she will try Tramadol as needed to relieve pain. We will also refer her to Orthopedics to consider injections or other treatments. We had a long discussion about what constitutes cardiopulmonary resuscitation, and I answered her questions. She decided to have the DNR order placed on her chart. We spent 45 minutes together discussing these issues.  Alysia Penna, MD

## 2018-04-28 ENCOUNTER — Ambulatory Visit (INDEPENDENT_AMBULATORY_CARE_PROVIDER_SITE_OTHER): Payer: Medicare HMO | Admitting: Psychology

## 2018-04-28 ENCOUNTER — Encounter: Payer: Self-pay | Admitting: Family Medicine

## 2018-04-28 ENCOUNTER — Encounter: Payer: Self-pay | Admitting: Psychology

## 2018-04-28 ENCOUNTER — Ambulatory Visit (INDEPENDENT_AMBULATORY_CARE_PROVIDER_SITE_OTHER): Payer: Medicare HMO | Admitting: Family Medicine

## 2018-04-28 ENCOUNTER — Ambulatory Visit: Payer: Medicare HMO | Admitting: Psychology

## 2018-04-28 VITALS — BP 122/64 | HR 71 | Temp 97.9°F | Ht 64.0 in | Wt 145.2 lb

## 2018-04-28 DIAGNOSIS — F0390 Unspecified dementia without behavioral disturbance: Secondary | ICD-10-CM | POA: Diagnosis not present

## 2018-04-28 DIAGNOSIS — S51812A Laceration without foreign body of left forearm, initial encounter: Secondary | ICD-10-CM

## 2018-04-28 DIAGNOSIS — R69 Illness, unspecified: Secondary | ICD-10-CM | POA: Diagnosis not present

## 2018-04-28 DIAGNOSIS — R404 Transient alteration of awareness: Secondary | ICD-10-CM

## 2018-04-28 DIAGNOSIS — Z23 Encounter for immunization: Secondary | ICD-10-CM | POA: Diagnosis not present

## 2018-04-28 MED ORDER — CEPHALEXIN 500 MG PO CAPS: 500.0000 mg | ORAL_CAPSULE | Freq: Three times a day (TID) | ORAL | 0 refills | Status: DC

## 2018-04-28 NOTE — Progress Notes (Signed)
   Subjective:    Patient ID: Virginia Franklin, female    DOB: 04/26/1929, 84 y.o.   MRN: [id number]  HPI Here with her daughter to check an injury to the left forearm. Today while leaning against her son's car she had a skin flap tear against something sharp. She did not fall. She was able to stop the bleeding. It does not hurt but she is worried about the possibility of infection.    Review of Systems  Constitutional: Negative.   Respiratory: Negative.   Cardiovascular: Negative.   Skin: Positive for wound.  Neurological: Negative.        Objective:   Physical Exam  Constitutional: She is oriented to person, place, and time. She appears well-developed and well-nourished.  Cardiovascular: Normal rate, regular rhythm, normal heart sounds and intact distal pulses.  Pulmonary/Chest: Effort normal and breath sounds normal.  Neurological: She is alert and oriented to person, place, and time.  Skin:  There is a small skin tear on the dorsal left forearm with the flap in place. No bleeding. No sign of infection          Assessment & Plan:  Skin tear. We will cover with Keflex. Given a TDaP.  Alysia Penna, MD

## 2018-04-28 NOTE — Progress Notes (Signed)
   Neuropsychology Note  Virginia Franklin completed 60 minutes of neuropsychological testing with technician, Milana Kidney, BS, under the supervision of Dr. Macarthur Critchley, Licensed Psychologist. The patient did not appear overtly distressed by the testing session, per behavioral observation or via self-report to the technician. Rest breaks were offered.   Clinical Decision Making: In considering the patient's current level of functioning, level of presumed impairment, nature of symptoms, emotional and behavioral responses during the interview, level of literacy, and observed level of motivation/effort, a battery of tests was selected and communicated to the psychometrician.  Communication between the psychologist and technician was ongoing throughout the testing session and changes were made as deemed necessary based on patient performance on testing, technician observations and additional pertinent factors such as those listed above.  Virginia Franklin will return within approximately 2 weeks for an interactive feedback session with Dr. Si Raider at which time her test performances, clinical impressions and treatment recommendations will be reviewed in detail. The patient understands she can contact our office should she require our assistance before this time.  35 minutes spent performing neuropsychological evaluation services/clinical decision making (psychologist). [CPT 37943] 60 minutes spent face-to-face with patient administering standardized tests, 30 minutes spent scoring (technician). [CPT Y8200648, 27614]  Full report to follow.

## 2018-04-28 NOTE — Progress Notes (Signed)
NEUROBEHAVIORAL STATUS EXAM   Name: Nashaly Dorantes Sava Date of Birth: February 23, 1929 Date of Interview: 04/28/2018  Reason for Referral:  Danaja Lasota Lambeth is a 84 y.o. female who is referred for neuropsychological evaluation by Dr. Ellouise Newer of [facility] due to concerns about dementia. This patient is accompanied in the office by her daughter who supplements the history.  History of Presenting Problem:  Ms. Mohammad moved from [place] to [place] in October 2018. She has a history of episodic confusion over the past couple of years, which was thought to be secondary to dehydration. Her daughter has also observed gradual decline of memory function in general over the past 4 years with more rapid progression in the last few months. Initial symptom was repeating questions/statements. Prior to moving here last year, she was having more difficulty with navigating while driving and did get lost on one occasion. She stopped driving when she moved to [facility].  Ms. Hogston was taken to the ED and admitted to [facility] on 12/16/2017 for altered mental status and confusion. She was getting INR checked and had a period of unresponsiveness and disorientation. Head CT, brain MRI, and EEG were negative for acute stroke or seizure. Brain MRI showed moderate chronic microvascular ischemic changes and parenchymal volume loss of brain. She was seen for initial neurology outpatient visit by Dr. Delice Lesch on 01/28/2018. MoCA was 20/30. The patient was prescribed donepezil. 24-hour EEG on 03/19/2018 was normal; however she did not have an episode during that time.  Current cognitive concerns include forgetfulness for recent conversations and events, repeating questions/statements, difficulty comprehending information and processes, and getting disoriented with where things are even in familiar locations like her retirement community. She also endorses more distractibility and trouble staying on task. Her daughter  notes that the patient has had the same smart phone for several years and was able to use it just fine but it has become harder and harder for her to use recently.   The patient also notes that since the episode in February, she will wake up in the morning and feel "lost" and disoriented, "thinking, 'where is everything?'". She feels as if her mind is clouded. After a while, it clears up. This had also happened occasionally when she lived in [place] but it is happening more frequently now. Also since February, her daughter notices that she has occasional dysarthria. This was not present before.   The patient is living in [facility]. Her daughter is managing her finances/bills (she took this over in November 2018). The patient is still managing her own medications but they are going to get help with this soon, as she has been having more trouble with this and making some errors. Meals are provided by the community. She tracks her own appointments on her calendar. She has intermittent confusion about her appointments, even if she is looking at her calendar, she may not comprehend whether or not she has an appointment that day. She keeps up with her own housekeeping and laundry noting she is a very neat and meticulous person.  Physically, she feels "okay", she does have chronic pain from arthritis. She has some unsteadiness especially with stairs. She has not had any falls since moving to [place]. She sleeps well. No major changes in appetite. She reports her mood is stable. She denies depressed mood, lack of interest, or significant anxiety. Her daughter denies any change in mood.  She has a history of grief related depression when her husband died  4 years ago. She was started on an antidepressant at that time. Otherwise psychiatric history is negative. She denies suicidal ideation or intention. She has no history of A/V hallucinations.   Medical history is significant for open heart surgery 28 years ago.  She has recently had trouble regulating INR/Coumadin since moving here. In the past she had significant dizziness for many years, but this rarely occurs now. Also in the past she would have episodes of a quick jerk or move of her body (not convulsions) that would come out of nowhere, but that seemed to go away.  She was unable to tolerate donepezil due to GI side effects. She would be interested in an alternative medication.   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 She has two children and 5 grandchildren. Of note, she struggled to recall how many grandchildren she has, and for a while was unable to name all of them, but then it seemed to come back to her. Alcohol: Socially, minimal Tobacco: She smoked socially when she was much younger. No tobacco use in many decades.   Medical History: Past Medical History:  Diagnosis Date  . Body mass index (BMI) of 22.0-22.9 in adult   . Cervical cancer ([place])   . H/O cervical fracture   . H/O osteoporosis   . H/O rheumatoid arthritis   . H/O: CVA (cerebrovascular accident) 2012  . H/O: osteoarthritis   . History of urinary frequency   . Hx of diverticulitis of colon   . Hypertension       Current Medications:  Outpatient Encounter Medications as of 04/28/2018  Medication Sig  . acetaminophen (TYLENOL) 650 MG CR tablet Take 650 mg by mouth every 8 (eight) hours as needed for pain.  . Calcium Citrate-Vitamin D (CALCIUM + D PO) Take 2 tablets by mouth at bedtime.  Marland Kitchen escitalopram (LEXAPRO) 5 MG tablet Take 5 mg by mouth daily.  Marland Kitchen ezetimibe-simvastatin (VYTORIN) 10-40 MG tablet TAKE 1 TABLET DAILY  . furosemide (LASIX) 20 MG tablet TAKE 1 TABLET DAILY  . Glucosamine-Chondroit-Vit C-Mn (GLUCOSAMINE-CHONDROITIN) CAPS Take by mouth.  Marland Kitchen lisinopril (PRINIVIL,ZESTRIL) 10 MG tablet TAKE 1  TABLET DAILY  . Omega-3 Fatty Acids (FISH OIL) 1000 MG CAPS Take 2,000 mg by mouth every morning.  . traMADol (ULTRAM) 50 MG tablet Take 2 tablets (100 mg total) by mouth every 6 (six) hours as needed.  . TURMERIC PO Take 3 capsules by mouth every morning. Turmeric with biophrine 500mg   . vitamin B-12 (CYANOCOBALAMIN) 1000 MCG tablet Take 1,000 mcg by mouth daily.  Marland Kitchen warfarin (COUMADIN) 10 MG tablet Take as directed by [facility]  . warfarin (COUMADIN) 7.5 MG tablet Take 1 tablet by mouth every day except Fridays.   No facility-administered encounter medications on file as of 04/28/2018.      Behavioral Observations:   Appearance: Neatly and appropriately dressed and groomed, appearing much younger than her chronological age Gait: Ambulated independently, no gross abnormalities observed Speech: Fluent; normal rate, rhythm and volume. Mild word finding difficulty. Thought process: Generally linear Affect: Full, euthymic Interpersonal: Pleasant, appropriate   50 minutes spent face-to-face with patient completing neurobehavioral status exam. 35 minutes spent integrating medical records/clinical data and completing this report. CPT code [phone] unit.   TESTING: There is medical necessity to proceed with neuropsychological assessment as the results will be used to aid in differential diagnosis and clinical  decision-making and to inform specific treatment recommendations. Per the patient, her daughter and medical records reviewed, there has been a change in cognitive functioning and a reasonable suspicion of dementia.  Clinical Decision Making: In considering the patient's current level of functioning, level of presumed impairment, nature of symptoms, emotional and behavioral responses during the interview, level of literacy, and observed level of motivation, a battery of tests was selected and communicated to the psychometrician.    Following the clinical interview/neurobehavioral  status exam, the patient completed this full battery of neuropsychological testing with my psychometrician under my supervision (see separate note).   PLAN: The patient will return to see me for a follow-up session at which time her test performances and my impressions and treatment recommendations will be reviewed in detail.  Evaluation ongoing; full report to follow.

## 2018-04-29 ENCOUNTER — Ambulatory Visit (INDEPENDENT_AMBULATORY_CARE_PROVIDER_SITE_OTHER): Payer: Medicare HMO

## 2018-04-29 DIAGNOSIS — Z5181 Encounter for therapeutic drug level monitoring: Secondary | ICD-10-CM

## 2018-04-29 DIAGNOSIS — Z7901 Long term (current) use of anticoagulants: Secondary | ICD-10-CM

## 2018-04-29 DIAGNOSIS — Z952 Presence of prosthetic heart valve: Secondary | ICD-10-CM | POA: Diagnosis not present

## 2018-04-29 LAB — POCT INR: INR: 4.4 — AB (ref 2.0–3.0)

## 2018-04-29 NOTE — Patient Instructions (Signed)
Description   Skip tomorrow's dosage of Coumadin, then start taking 7.5mg  daily except 10mg  on Tuesdays. Recheck in 2 weeks. [facility]in Clinic [phone] Main [phone].

## 2018-05-05 NOTE — Progress Notes (Signed)
NEUROPSYCHOLOGICAL EVALUATION   Name:    Virginia Franklin  Date of Birth:   1928-11-29 Date of Interview:  04/28/2018 Date of Testing:  04/28/2018   Date of Feedback:  05/08/2018       Background Information:  Reason for Referral:  Shaylene Paganelli Spinner is a 84 y.o. female referred by Dr. Ellouise Newer to assess her current level of cognitive functioning and assist in differential diagnosis. The current evaluation consisted of a review of available medical records, an interview with the patient and her daughter, and the completion of a neuropsychological testing battery. Informed consent was obtained.  History of Presenting Problem:  Ms. Gosnell moved from [place] to [place] in October 2018. She has a history of episodic confusion over the past couple of years, which was thought to be secondary to dehydration. Her daughter has also observed gradual decline of memory function in general over the past 4 years with more rapid progression in the last few months. Initial symptom was repeating questions/statements. Prior to moving here last year, she was having more difficulty with navigating while driving and did get lost on one occasion. She stopped driving when she moved to [facility].  Ms. Gul was taken to the ED and admitted to [facility] on 12/16/2017 for altered mental status and confusion. She was getting INR checked and had a period of unresponsiveness and disorientation. Head CT, brain MRI, and EEG were negative for acute stroke or seizure. Brain MRI showed moderate chronic microvascular ischemic changes and parenchymal volume loss of brain. She was seen for initial neurology outpatient visit by Dr. Delice Lesch on 01/28/2018. MoCA was 20/30. The patient was prescribed donepezil. 24-hour EEG on 03/19/2018 was normal; however it did not capture any symptomatic episodes.  Current cognitive concerns include forgetfulness for recent conversations and events, repeating questions/statements, difficulty  comprehending information and processes, and getting disoriented with where things are even in familiar locations like her retirement community. She also endorses more distractibility and trouble staying on task. Her daughter notes that the patient has had the same smart phone for several years and was able to use it just fine but it has become harder and harder for her to use recently.   The patient also notes that since the episode in February, she will wake up in the morning and feel "lost" and disoriented, "thinking, 'where is everything?'". She feels as if her mind is clouded. After a while, it clears up. This had also happened occasionally when she lived in [place] but it is happening more frequently now. Also since February, her daughter notices that she has occasional dysarthria. This was not present before.   The patient is living in [facility] Her daughter is managing her finances/bills (she took this over in November 2018). The patient is still managing her own medications but they are going to get help with this soon, as she has been having more trouble with this and making some errors. Meals are provided by the community. She tracks her own appointments on her calendar. She has intermittent confusion about her appointments, even if she is looking at her calendar, she may not comprehend whether or not she has an appointment that day. She keeps up with her own housekeeping and laundry noting she is a very neat and meticulous person.  Physically, she feels "okay", she does have chronic pain from arthritis. She has some unsteadiness especially with stairs. She has not had any falls since moving to [place]. She sleeps well.  No major changes in appetite. She reports her mood is stable. She denies depressed mood, lack of interest, or significant anxiety. Her daughter denies any change in mood.  She has a history of grief related depression when her husband died 4 years ago. She was started on  an antidepressant at that time. Otherwise psychiatric history is negative. She denies suicidal ideation or intention. She has no history of A/V hallucinations.   Medical history is significant for open heart surgery 28 years ago. She has recently had trouble regulating INR/Coumadin since moving here. In the past she had significant dizziness for many years, but this rarely occurs now. Also in the past she would have episodes of a quick jerk or move of her body (not convulsions) that would come out of nowhere, but that seemed to go away.  She was unable to tolerate donepezil due to GI side effects. She would be interested in an alternative medication.   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 She has two children and 5 grandchildren. Of note, she struggled to recall how many grandchildren she has, and for a while was unable to name all of them, but then it seemed to come back to her. Alcohol: Socially, minimal Tobacco: She smoked socially when she was much younger. No tobacco use in many decades.   Medical History:  Past Medical History:  Diagnosis Date  . Body mass index (BMI) of 22.0-22.9 in adult   . Cervical cancer ([place])   . H/O cervical fracture   . H/O osteoporosis   . H/O rheumatoid arthritis   . H/O: CVA (cerebrovascular accident) 2012  . H/O: osteoarthritis   . History of urinary frequency   . Hx of diverticulitis of colon   . Hypertension     Current medications:  Outpatient Encounter Medications as of 05/08/2018  Medication Sig  . acetaminophen (TYLENOL) 650 MG CR tablet Take 650 mg by mouth every 8 (eight) hours as needed for pain.  . Calcium Citrate-Vitamin D (CALCIUM + D PO) Take 2 tablets by mouth at bedtime.  . cephALEXin (KEFLEX) 500 MG capsule Take 1 capsule (500 mg total) by mouth 3 (three) times  daily.  Marland Kitchen escitalopram (LEXAPRO) 5 MG tablet Take 5 mg by mouth daily.  Marland Kitchen ezetimibe-simvastatin (VYTORIN) 10-40 MG tablet TAKE 1 TABLET DAILY  . furosemide (LASIX) 20 MG tablet TAKE 1 TABLET DAILY  . Glucosamine-Chondroit-Vit C-Mn (GLUCOSAMINE-CHONDROITIN) CAPS Take by mouth.  Marland Kitchen lisinopril (PRINIVIL,ZESTRIL) 10 MG tablet TAKE 1 TABLET DAILY  . Omega-3 Fatty Acids (FISH OIL) 1000 MG CAPS Take 2,000 mg by mouth every morning.  . traMADol (ULTRAM) 50 MG tablet Take 2 tablets (100 mg total) by mouth every 6 (six) hours as needed.  . TURMERIC PO Take 3 capsules by mouth every morning. Turmeric with biophrine 557m  . vitamin B-12 (CYANOCOBALAMIN) 1000 MCG tablet Take 1,000 mcg by mouth daily.  .Marland Kitchenwarfarin (COUMADIN) 10 MG tablet Take as directed by Coumadin Clinic  . warfarin (COUMADIN) 7.5 MG tablet Take 1 tablet by mouth every day except Fridays.   No facility-administered encounter medications on file as of 05/08/2018.      Current Examination:  Behavioral Observations:  Appearance: Neatly and appropriately dressed and groomed, appearing much younger than her chronological age Gait: Ambulated independently, no gross abnormalities observed Speech: Fluent; normal rate, rhythm and volume. Mild word finding difficulty. Thought  process: Generally linear Affect: Full, euthymic Interpersonal: Pleasant, appropriate Orientation: Oriented to person, place and most aspects of time (one day off on the current date). Accurately named the current President but could not recall his predecessor.   Tests Administered: . Test of Premorbid Functioning (TOPF) . Wechsler Adult Intelligence Scale-Fourth Edition (WAIS-IV): Similarities, Music therapist, Coding and Digit Span subtests . [employer] Verbal Learning Test - 2nd Edition (CVLT-2) Short Form . Repeatable Battery for the Assessment of Neuropsychological Status (RBANS) Form A:  Figure Copy and Recall subtests, Story Memory and Story Recall subtests and  Semantic Fluency subtest . [employer] Test (BNT) . Boston Diagnostic Aphasia Examination: Complex Ideational Material subtest . Controlled Oral Word Association Test (COWAT) . Trail Making Test A and B . Clock drawing test . Geriatric Depression Scale (GDS) 15 Item . Generalized Anxiety Disorder - 7 item screener (GAD-7)  Test Results: Note: Standardized scores are presented only for use by appropriately trained professionals and to allow for any future test-retest comparison. These scores should not be interpreted without consideration of all the information that is contained in the rest of the report. The most recent standardization samples from the test publisher or other sources were used whenever possible to derive standard scores; scores were corrected for age, gender, ethnicity and education when available.   Test Scores:  Test Name Raw Score Standardized Score Descriptor  TOPF 21/70 SS= 86 Low average  WAIS-IV Subtests     Similarities 11/36 ss= 6 Low average  Block Design 24/66 ss= 11 Average  Coding 18/135 ss= 7 Low average  Digit Span Forward 8/16 ss= 8 Low end of average  Digit Span Backward 4/16 ss= 6 Low average  RBANS Subtests     Figure Copy 16/20 Z= -0.7 Average  Figure Recall 5/20 Z= -1.6 Borderline  Story Memory 4/24 Z= -2.9 Severely impaired  Story Recall 2/12 Z= -1.9 Borderline  Semantic Fluency 10/40 Z= -2 Impaired  CVLT-II Scores     Trial 1 0/9 Z= -3.5 Severely impaired  Trial 4 4/9 Z= -2 Impaired   Trials 1-4 total 8/36 T= 15 Severely impaired  SD Free Recall 2/9 Z= -2.5 Impaired  LD Free Recall 0/9 Z= -2.5 Impaired  LD Cued Recall 0/9 Z= -3.5 Severely impaired  Recognition Discriminability 6/9 hits, 8 false positives Z= -2 Impaired  Forced Choice Recognition 9/9  WNL  BNT 28/60 T= 17 Severely impaired  BDAE Subtest     Complex Ideational Material 3/6  Impaired  COWAT-FAS 14 T= 36 Borderline  COWAT-Animals 9 T= 36 Borderline  Trail Making Test A   91" 2 errors T= 38 Low average  Trail Making Test B  Pt unable   Impaired  Clock Drawing   Impaired  GDS-15 1/15  WNL  GAD-7 0/21  WNL      Description of Test Results:  Premorbid verbal intellectual abilities were estimated to have been within the low average range based on a test of word reading; this may be an underestimate given her schooling was in [place]. Psychomotor processing speed was low average. Auditory attention and working memory were low average, below expectation. Visual-spatial construction was average. Language abilities were below expectation even when considering her schooling was completed in a different country. Specifically, confrontation naming was severely impaired, and semantic verbal fluency was borderline to impaired. Auditory comprehension of complex ideational material was impaired. With regard to verbal memory, encoding and acquisition of non-contextual information (i.e., word list) was severely impaired. After a brief  distracter task, free recall was impaired (2/9 items). After a delay, free recall was impaired (0/9 items). Cued recall was severely impaired (0/9 items). Performance on a yes/no recognition task was impaired. On another verbal memory test, encoding and acquisition of contextual auditory information (i.e., short story) was severely impaired. After a delay, free recall was borderline impaired. With regard to non-verbal memory, delayed free recall of visual information was borderline. Executive functioning was variable. Mental flexibility and set-shifting were severely impaired; she was unable to complete Trails [address]. Verbal fluency with phonemic search restrictions was borderline impaired. Verbal abstract reasoning was low average. Performance on a clock drawing task was impaired. On self-report measures of mood, the patient's responses were not indicative of clinically significant depression or anxiety at the present time.    Clinical Impressions: Mild  dementia, most likely due to Alzheimer's disease, but cannot rule out mixed dementia (Alzheimer's disease and vascular dementia).  Results of cognitive testing were abnormal, even when considering the patient's educational background (schooling completed in [place]) and the fact that [place] is not her first language. Furthermore, there is evidence that cognitive deficits are interfering with her ability to manage complex tasks such as driving, appointments and medications. As such, diagnostic criteria for a dementia syndrome are met. The patient's cognitive profile is suggestive of both cortical and subcortical dysfunction. Consistent with cortical dysfunction, she demonstrated impaired memory consolidation and impaired semantic retrieval. Consistent with subcortical dysfunction, she demonstrated mild impairment in working memory and several aspects of executive functioning. I suspect Alzheimer's disease is present and there may be superimposed vascular dementia as well, based on cognitive profile, neuroimaging and medical history.  Fortunately, there is no evidence of mood or behavioral disturbance.    Recommendations/Plan: Based on the findings of the present evaluation, the following recommendations are offered:  1. The patient was prescribed donepezil but had negative side effects (GI upset). She would like to know if there is an alternative (such as Namenda?) that she could try. I will forward this message to Dr. Delice Lesch to advise; the patient's daughter would like a phone call about this given that their follow-up with Dr. Delice Lesch is not until October. 2. At this point in time I recommend that a caregiver manage the patient's medications to ensure they are taken correctly (especially given the importance of Coumadin being taken correctly). She is amenable to this. Her daughter will get this set up through [facility]. 3. She will also continue to need help managing appointments. 4. I recommended  that the patient continue to incorporate daily routine as much as possible to maximize cognitive functioning and engagement in various activities. 6. I also recommended that she continue to get regular social interaction for ongoing mental stimulation.   Feedback to Patient: Katheline Brendlinger Lamia and her daughter returned for a feedback appointment on 05/08/2018 to review the results of her neuropsychological evaluation with this provider. 30 minutes face-to-face time was spent reviewing her test results, my impressions and my recommendations as detailed above.    Total time spent on this patient's case: 85 minutes for neurobehavioral status exam with psychologist (CPT code [phone]); 90 minutes of testing/scoring by psychometrician under psychologist's supervision (CPT codes [phone], [phone] units); 180 minutes for integration of patient data, interpretation of standardized test results and clinical data, clinical decision making, treatment planning and preparation of this report, and interactive feedback with review of results to the patient/family by psychologist (CPT codes [phone], [phone] units).      Thank you  for your referral of Brieonna Crutcher Becraft. Please feel free to contact me if you have any questions or concerns regarding this report.

## 2018-05-08 ENCOUNTER — Encounter: Payer: Self-pay | Admitting: Psychology

## 2018-05-08 ENCOUNTER — Ambulatory Visit (INDEPENDENT_AMBULATORY_CARE_PROVIDER_SITE_OTHER): Payer: Medicare HMO | Admitting: Psychology

## 2018-05-08 DIAGNOSIS — G301 Alzheimer's disease with late onset: Secondary | ICD-10-CM

## 2018-05-08 DIAGNOSIS — F0280 Dementia in other diseases classified elsewhere without behavioral disturbance: Secondary | ICD-10-CM | POA: Diagnosis not present

## 2018-05-08 NOTE — Patient Instructions (Signed)
1. You may be an appropriate candidate for an alternative medication to donepezil for memory loss. You can speak with Dr. Delice Lesch about this. 2. I recommend having a caregiver/staff manage medications so that we can ensure they are being taken correctly and you do not have to worry about forgetting doses. 3. I recommend your family continue to assist with management of appointments. 4. You will function at your best with a daily routine of activities. 5. To stimulate the brain, research shows listening to music you enjoy on a regular basis is helpful, as is regular social interaction.

## 2018-05-12 ENCOUNTER — Telehealth: Payer: Self-pay | Admitting: Neurology

## 2018-05-12 MED ORDER — MEMANTINE HCL 10 MG PO TABS: ORAL_TABLET | ORAL | 5 refills | Status: DC

## 2018-05-12 NOTE — Telephone Encounter (Signed)
Per Dr. Marcia Brash notes, It looks like pt had GI side effects on Donepezil and would like to try Namenda.

## 2018-05-12 NOTE — Telephone Encounter (Signed)
That is fine, pls have her start Namenda 10mg  qhs x 1 week, then increase to 1 tab BID and continue. Sometimes people may feel a little dizzy starting medication, but this usually improves over time. Call for any issues. Thanks

## 2018-05-12 NOTE — Telephone Encounter (Signed)
Spoke with pt's daughter relaying message below.  Verified preferred pharmacy.  Rx sent with 5 refills.

## 2018-05-13 ENCOUNTER — Ambulatory Visit (INDEPENDENT_AMBULATORY_CARE_PROVIDER_SITE_OTHER): Payer: Medicare HMO | Admitting: Pharmacist

## 2018-05-13 DIAGNOSIS — Z5181 Encounter for therapeutic drug level monitoring: Secondary | ICD-10-CM

## 2018-05-13 DIAGNOSIS — Z7901 Long term (current) use of anticoagulants: Secondary | ICD-10-CM

## 2018-05-13 DIAGNOSIS — Z952 Presence of prosthetic heart valve: Secondary | ICD-10-CM

## 2018-05-13 LAB — POCT INR: INR: 4.4 — AB (ref 2.0–3.0)

## 2018-05-13 NOTE — Patient Instructions (Signed)
Description   Skip tomorrow's dosage of Coumadin, then start taking 7.5mg  daily. Recheck in 2 weeks. [facility] [phone] Main [phone].

## 2018-05-18 ENCOUNTER — Other Ambulatory Visit: Payer: Self-pay | Admitting: Family Medicine

## 2018-05-19 ENCOUNTER — Telehealth: Payer: Self-pay | Admitting: *Deleted

## 2018-05-19 MED ORDER — FUROSEMIDE 20 MG PO TABS: 20.0000 mg | ORAL_TABLET | Freq: Every day | ORAL | 3 refills | Status: DC

## 2018-05-19 NOTE — Telephone Encounter (Signed)
Sent to PCP for the OK for verbal orders  

## 2018-05-19 NOTE — Telephone Encounter (Signed)
Copied from [place] [id number]. Topic: Inquiry >> May 19, 2018  9:47 AM Oliver Pila B wrote: Reason for CRM: Speech therapy called to get verbal orders for the pt, contact Margarite Gouge @ [phone]

## 2018-05-20 NOTE — Telephone Encounter (Signed)
Please okay these orders  ?

## 2018-05-20 NOTE — Telephone Encounter (Signed)
Called Katie and left a VM for the OK for verbal orders left pt's name and DOB. Left our call back number if needed for Katie.

## 2018-05-21 ENCOUNTER — Other Ambulatory Visit: Payer: Self-pay

## 2018-05-21 ENCOUNTER — Telehealth: Payer: Self-pay | Admitting: Family Medicine

## 2018-05-21 DIAGNOSIS — R41841 Cognitive communication deficit: Secondary | ICD-10-CM | POA: Diagnosis not present

## 2018-05-21 MED ORDER — FUROSEMIDE 20 MG PO TABS: 20.0000 mg | ORAL_TABLET | Freq: Every day | ORAL | 0 refills | Status: DC

## 2018-05-21 NOTE — Telephone Encounter (Signed)
Please advise 

## 2018-05-21 NOTE — Telephone Encounter (Signed)
Copied from [place] [id number]. Topic: Quick Communication - Rx Refill/Question >> May 21, 2018  4:26 PM Oliver Pila B wrote: Pt called b/c she is completely out of the medication and [facility] and is wanting a couple of pills to hold her over until the full Rx get to her, contact to advise  Medication: furosemide (LASIX) 20 MG tablet [[id number]]   Has the patient contacted their pharmacy? Yes.   (Agent: If no, request that the patient contact the pharmacy for the refill.) (Agent: If yes, when and what did the pharmacy advise?)  [facility] ([facility] phone number [facility] street [facility]  Agent: Please be advised that RX refills may take up to 3 business days. We ask that you follow-up with your pharmacy.

## 2018-05-27 ENCOUNTER — Ambulatory Visit (INDEPENDENT_AMBULATORY_CARE_PROVIDER_SITE_OTHER): Payer: Medicare HMO | Admitting: *Deleted

## 2018-05-27 DIAGNOSIS — Z7901 Long term (current) use of anticoagulants: Secondary | ICD-10-CM

## 2018-05-27 DIAGNOSIS — Z952 Presence of prosthetic heart valve: Secondary | ICD-10-CM | POA: Diagnosis not present

## 2018-05-27 DIAGNOSIS — Z5181 Encounter for therapeutic drug level monitoring: Secondary | ICD-10-CM

## 2018-05-27 LAB — POCT INR: INR: 2.6 (ref 2.0–3.0)

## 2018-05-27 MED ORDER — WARFARIN SODIUM 7.5 MG PO TABS: ORAL_TABLET | ORAL | 3 refills | Status: DC

## 2018-05-27 NOTE — Patient Instructions (Signed)
Description   Continue taking 7.5mg  daily. Recheck in 3 weeks. [facility] [phone] Main [phone].

## 2018-05-29 DIAGNOSIS — R41841 Cognitive communication deficit: Secondary | ICD-10-CM | POA: Diagnosis not present

## 2018-05-30 DIAGNOSIS — R41841 Cognitive communication deficit: Secondary | ICD-10-CM | POA: Diagnosis not present

## 2018-06-02 DIAGNOSIS — R41841 Cognitive communication deficit: Secondary | ICD-10-CM | POA: Diagnosis not present

## 2018-06-05 ENCOUNTER — Encounter: Payer: Self-pay | Admitting: Family Medicine

## 2018-06-06 MED ORDER — DICLOFENAC SODIUM 1 % TD GEL: 2.0000 g | Freq: Four times a day (QID) | TRANSDERMAL | 3 refills | Status: DC

## 2018-06-06 MED ORDER — MEMANTINE HCL 10 MG PO TABS: 10.0000 mg | ORAL_TABLET | Freq: Two times a day (BID) | ORAL | 1 refills | Status: DC

## 2018-06-06 NOTE — Telephone Encounter (Signed)
This was sent in  

## 2018-06-06 NOTE — Addendum Note (Signed)
Addended by: Annamaria Helling on: 06/06/2018 09:38 AM   Modules accepted: Orders

## 2018-06-09 DIAGNOSIS — R41841 Cognitive communication deficit: Secondary | ICD-10-CM | POA: Diagnosis not present

## 2018-06-11 DIAGNOSIS — R41841 Cognitive communication deficit: Secondary | ICD-10-CM | POA: Diagnosis not present

## 2018-06-16 DIAGNOSIS — R41841 Cognitive communication deficit: Secondary | ICD-10-CM | POA: Diagnosis not present

## 2018-06-17 ENCOUNTER — Telehealth: Payer: Self-pay

## 2018-06-17 ENCOUNTER — Ambulatory Visit (INDEPENDENT_AMBULATORY_CARE_PROVIDER_SITE_OTHER): Payer: Medicare HMO | Admitting: Adult Health

## 2018-06-17 ENCOUNTER — Telehealth: Payer: Self-pay | Admitting: Family Medicine

## 2018-06-17 ENCOUNTER — Ambulatory Visit (INDEPENDENT_AMBULATORY_CARE_PROVIDER_SITE_OTHER): Payer: Medicare HMO

## 2018-06-17 ENCOUNTER — Encounter: Payer: Self-pay | Admitting: Adult Health

## 2018-06-17 VITALS — BP 150/80 | Temp 98.3°F | Wt 144.0 lb

## 2018-06-17 DIAGNOSIS — Z5181 Encounter for therapeutic drug level monitoring: Secondary | ICD-10-CM

## 2018-06-17 DIAGNOSIS — R350 Frequency of micturition: Secondary | ICD-10-CM

## 2018-06-17 DIAGNOSIS — Z952 Presence of prosthetic heart valve: Secondary | ICD-10-CM | POA: Diagnosis not present

## 2018-06-17 DIAGNOSIS — R300 Dysuria: Secondary | ICD-10-CM

## 2018-06-17 DIAGNOSIS — Z7901 Long term (current) use of anticoagulants: Secondary | ICD-10-CM | POA: Diagnosis not present

## 2018-06-17 LAB — POC URINALSYSI DIPSTICK (AUTOMATED)
BILIRUBIN UA: NEGATIVE
Glucose, UA: NEGATIVE
Ketones, UA: NEGATIVE
Leukocytes, UA: NEGATIVE
NITRITE UA: NEGATIVE
PH UA: 7 (ref 5.0–8.0)
Protein, UA: NEGATIVE
SPEC GRAV UA: 1.015 (ref 1.010–1.025)
UROBILINOGEN UA: 0.2 U/dL

## 2018-06-17 LAB — POCT INR: INR: 2.6 (ref 2.0–3.0)

## 2018-06-17 NOTE — Telephone Encounter (Signed)
She is scheduled to see Tommi Rumps today at 6 pm

## 2018-06-17 NOTE — Telephone Encounter (Signed)
Sent to PCP to advise for the OK to drop off urine will need orders in the system   Copied from [place] #[id number]. Topic: General - Other >> Jun 17, 2018  8:43 AM Oneta Rack wrote: Relation to pt: self Call back number: [phone]  [id number] [place], [place] [phone] (Phone) [phone] (Fax)  Reason for call:  Patient urine dark yellow, odor, frequent urination, burning and heaviness in kidney. Patient scheduled for her coumadin today at [facility] and would like to drop off urine due to patient transportation being limited, please advise

## 2018-06-17 NOTE — Telephone Encounter (Signed)
Pt daughter called to get status, please advise [phone]

## 2018-06-17 NOTE — Progress Notes (Signed)
Subjective:    Patient ID: Virginia Franklin, female    DOB: 10-Dec-1928, 84 y.o.   MRN: [id number]  HPI 84 year old female who  has a past medical history of Body mass index (BMI) of 22.0-22.9 in adult, Cervical cancer ([place]), H/O cervical fracture, H/O osteoporosis, H/O rheumatoid arthritis, H/O: CVA (cerebrovascular accident) (2012), H/O: osteoarthritis, History of urinary frequency, diverticulitis of colon, and Hypertension.  She is a patient of Dr. Sarajane Jews who I am seeing today for an acute concern of possible UTI. She reports that her symptoms include that of darker urine, odor, frequent urination, dysuria, and a feeling of " heaviness in the kidney's".  She reports that her symptoms have been present for the last 2 months.  Symptoms are more present in the morning and then taper off in the afternoon if she drinks more water.  She also has been exercising more at her new assisted living facility.  She denies any pelvic pain, fevers, nausea, diarrhea, or feeling acutely ill   Review of Systems See HPI   Past Medical History:  Diagnosis Date  . Body mass index (BMI) of 22.0-22.9 in adult   . Cervical cancer ([place])   . H/O cervical fracture   . H/O osteoporosis   . H/O rheumatoid arthritis   . H/O: CVA (cerebrovascular accident) 2012  . H/O: osteoarthritis   . History of urinary frequency   . Hx of diverticulitis of colon   . Hypertension     Social History   Socioeconomic History  . Marital status: Widowed    Spouse name: Not on file  . Number of children: 2  . Years of education: 87  . Highest education level: Not on file  Occupational History  . Not on file  Social Needs  . Financial resource strain: Not on file  . Food insecurity:    Worry: Not on file    Inability: Not on file  . Transportation needs:    Medical: Not on file    Non-medical: Not on file  Tobacco Use  . Smoking status: Former Research scientist (life sciences)  . Smokeless tobacco: Never Used  Substance and Sexual Activity    . Alcohol use: Yes    Comment: SOCIALLY  . Drug use: No  . Sexual activity: Not on file    Comment: WIDOW  Lifestyle  . Physical activity:    Days per week: Not on file    Minutes per session: Not on file  . Stress: Not on file  Relationships  . Social connections:    Talks on phone: Not on file    Gets together: Not on file    Attends religious service: Not on file    Active member of club or organization: Not on file    Attends meetings of clubs or organizations: Not on file    Relationship status: Not on file  . Intimate partner violence:    Fear of current or ex partner: Not on file    Emotionally abused: Not on file    Physically abused: Not on file    Forced sexual activity: Not on file  Other Topics Concern  . Not on file  Social History Narrative   Lives alone in a retirement home.  Has 2 children.  Retired Haematologist.  Education: Probation officer school.     Past Surgical History:  Procedure Laterality Date  . ABDOMINAL HYSTERECTOMY    . CARDIAC SURGERY    . CATARACT EXTRACTION    .  SMALL INTESTINE SURGERY      Family History  Problem Relation Age of Onset  . Kidney disease Mother        MALIGNANT NEOPLASM  . Arthritis Father   . Heart Problems Father   . Lung disease Brother   . Heart disease Neg Hx     Allergies  Allergen Reactions  . Penicillins Swelling    Has patient had a PCN reaction causing immediate rash, facial/tongue/throat swelling, SOB or lightheadedness with hypotension: Yes Has patient had a PCN reaction causing severe rash involving mucus membranes or skin necrosis: No Has patient had a PCN reaction that required hospitalization: No Has patient had a PCN reaction occurring within the last 10 years: No If all of the above answers are "NO", then may proceed with Cephalosporin use.    Current Outpatient Medications on File Prior to Visit  Medication Sig Dispense Refill  . acetaminophen (TYLENOL) 650 MG CR tablet Take 650 mg by mouth  every 8 (eight) hours as needed for pain.    . Calcium Citrate-Vitamin D (CALCIUM + D PO) Take 2 tablets by mouth at bedtime.    . cephALEXin (KEFLEX) 500 MG capsule Take 1 capsule (500 mg total) by mouth 3 (three) times daily. 21 capsule 0  . diclofenac sodium (VOLTAREN) 1 % GEL Apply 2 g topically 4 (four) times daily. 100 g 3  . escitalopram (LEXAPRO) 5 MG tablet Take 5 mg by mouth daily.    Marland Kitchen ezetimibe-simvastatin (VYTORIN) 10-40 MG tablet TAKE 1 TABLET DAILY 90 tablet 3  . furosemide (LASIX) 20 MG tablet Take 1 tablet (20 mg total) by mouth daily. 30 tablet 0  . Glucosamine-Chondroit-Vit C-Mn (GLUCOSAMINE-CHONDROITIN) CAPS Take by mouth.    Marland Kitchen lisinopril (PRINIVIL,ZESTRIL) 10 MG tablet TAKE 1 TABLET DAILY 90 tablet 0  . memantine (NAMENDA) 10 MG tablet Take 1 tablet (10 mg total) by mouth 2 (two) times daily. Take 1 tablet at bedtime for 1 week, then increase to 1 tablet twice daily. 180 tablet 1  . Omega-3 Fatty Acids (FISH OIL) 1000 MG CAPS Take 2,000 mg by mouth every morning.    . traMADol (ULTRAM) 50 MG tablet Take 2 tablets (100 mg total) by mouth every 6 (six) hours as needed. 120 tablet 2  . TURMERIC PO Take 3 capsules by mouth every morning. Turmeric with biophrine 500mg     . vitamin B-12 (CYANOCOBALAMIN) 1000 MCG tablet Take 1,000 mcg by mouth daily.    Marland Kitchen warfarin (COUMADIN) 10 MG tablet Take as directed by [facility] Clinic 90 tablet 0  . warfarin (COUMADIN) 7.5 MG tablet Take 1 tablet by mouth every day except Fridays. 35 tablet 3   No current facility-administered medications on file prior to visit.     BP (!) 150/80   Temp 98.3 F (36.8 C) (Oral)   Wt 144 lb (65.3 kg)   BMI 24.72 kg/m       Objective:   Physical Exam  Constitutional: She is oriented to person, place, and time. She appears well-developed and well-nourished. No distress.  Cardiovascular: Normal rate, regular rhythm, normal heart sounds and intact distal pulses.  Pulmonary/Chest: Effort normal and breath  sounds normal.  Abdominal: Soft. Normal appearance and bowel sounds are normal. There is no tenderness. There is no CVA tenderness.  Musculoskeletal: Normal range of motion. She exhibits edema (trace non pitting lower extremity edema billaterally).  Neurological: She is alert and oriented to person, place, and time.  Skin: Skin is warm and dry.  She is not diaphoretic.  Psychiatric: She has a normal mood and affect. Her behavior is normal. Judgment and thought content normal.  Nursing note and vitals reviewed.     Assessment & Plan:  Symptoms likely due to concentrated urine.  Her UA was negative except for 1+ blood.  Will send culture to make sure she does not have a UTI.  No treatment was done today until urine culture returns.  Advised patient to continue to exercise and drink plenty of water throughout the day.  Follow-up as needed  Dorothyann Peng, NP

## 2018-06-17 NOTE — Patient Instructions (Signed)
Description   Continue on same dosage 7.5mg  daily. Recheck in 4 weeks. [facility] [phone] Main [phone].

## 2018-06-17 NOTE — Telephone Encounter (Signed)
Sent a CRM

## 2018-06-18 DIAGNOSIS — R41841 Cognitive communication deficit: Secondary | ICD-10-CM | POA: Diagnosis not present

## 2018-06-18 LAB — URINE CULTURE
MICRO NUMBER:: 90928860
Result:: NO GROWTH
SPECIMEN QUALITY:: ADEQUATE

## 2018-06-27 ENCOUNTER — Other Ambulatory Visit: Payer: Self-pay | Admitting: Interventional Cardiology

## 2018-06-27 DIAGNOSIS — R41841 Cognitive communication deficit: Secondary | ICD-10-CM | POA: Diagnosis not present

## 2018-06-30 ENCOUNTER — Telehealth: Payer: Self-pay | Admitting: Family Medicine

## 2018-06-30 ENCOUNTER — Telehealth: Payer: Self-pay | Admitting: Interventional Cardiology

## 2018-06-30 ENCOUNTER — Other Ambulatory Visit: Payer: Self-pay | Admitting: *Deleted

## 2018-06-30 DIAGNOSIS — R41841 Cognitive communication deficit: Secondary | ICD-10-CM | POA: Diagnosis not present

## 2018-06-30 MED ORDER — LISINOPRIL 10 MG PO TABS: 10.0000 mg | ORAL_TABLET | Freq: Every day | ORAL | 2 refills | Status: DC

## 2018-06-30 NOTE — Telephone Encounter (Signed)
Call to pharmacy- patient has refills on the following: escitalopram, furosemide, and ezetimide-simvastatin Lisinopril refilled per protocol. LOV: 01/13/18

## 2018-06-30 NOTE — Telephone Encounter (Signed)
Called pt to inform pt that her PCP prescribed these medications and has been refilling her medications and that pt needed to call her PCP to request refills. I advised her that if she has any other problems, questions or concerns, please give our office a call back. Pt verbalized understanding.

## 2018-06-30 NOTE — Telephone Encounter (Signed)
New Message      Pt c/o medication issue:  1. Name of Medication: Lisinopril/Escitalopram 5 mg/Furosemide 20 mg/Simvastatin-40mg   2. How are you currently taking this medication (dosage and times per day)? Once a day for both  3. Are you having a reaction (difficulty breathing--STAT)? No   4. What is your medication issue? Patient states she is running short on her meds.    *STAT* If patient is at the pharmacy, call can be transferred to refill team.   1. Which medications need to be refilled? (please list name of each medication and dose if known)      Lisinopril/Escitalopram 5 mg/Furosemide 20 mg/Simvastatin-40mg   2. Which pharmacy/location (including street and city if local pharmacy) is medication to be sent to?[facility] [place], [place]  3. Do they need a 30 day or 90 day supply? [place]

## 2018-06-30 NOTE — Telephone Encounter (Signed)
Copied from [place] [phone]. Topic: Quick Communication - Rx Refill/Question >> Jun 30, 2018 12:56 PM Selinda Flavin B, NT wrote: Medication: lisinopril (PRINIVIL,ZESTRIL) 20 MG tablet escitalopram (LEXAPRO) 5 MG tablet  furosemide (LASIX) 20 MG tablet ezetimibe-simvastatin (VYTORIN) 10-40 MG tablet  Has the patient contacted their pharmacy? Yes.   (Agent: If no, request that the patient contact the pharmacy for the refill.) (Agent: If yes, when and what did the pharmacy advise?)  Preferred Pharmacy (with phone number or street name): [place]  Agent: Please be advised that RX refills may take up to 3 business days. We ask that you follow-up with your pharmacy.

## 2018-07-02 DIAGNOSIS — R41841 Cognitive communication deficit: Secondary | ICD-10-CM | POA: Diagnosis not present

## 2018-07-09 DIAGNOSIS — R41841 Cognitive communication deficit: Secondary | ICD-10-CM | POA: Diagnosis not present

## 2018-07-11 DIAGNOSIS — R41841 Cognitive communication deficit: Secondary | ICD-10-CM | POA: Diagnosis not present

## 2018-07-15 ENCOUNTER — Ambulatory Visit (INDEPENDENT_AMBULATORY_CARE_PROVIDER_SITE_OTHER): Payer: Medicare HMO | Admitting: *Deleted

## 2018-07-15 DIAGNOSIS — Z5181 Encounter for therapeutic drug level monitoring: Secondary | ICD-10-CM

## 2018-07-15 DIAGNOSIS — Z952 Presence of prosthetic heart valve: Secondary | ICD-10-CM | POA: Diagnosis not present

## 2018-07-15 DIAGNOSIS — Z7901 Long term (current) use of anticoagulants: Secondary | ICD-10-CM | POA: Diagnosis not present

## 2018-07-15 LAB — POCT INR: INR: 2.8 (ref 2.0–3.0)

## 2018-07-15 NOTE — Patient Instructions (Signed)
Description   Continue on same dosage 7.5mg  daily. Recheck in 6 weeks. [facility] [phone] Main [phone].

## 2018-07-16 DIAGNOSIS — R41841 Cognitive communication deficit: Secondary | ICD-10-CM | POA: Diagnosis not present

## 2018-07-21 DIAGNOSIS — R41841 Cognitive communication deficit: Secondary | ICD-10-CM | POA: Diagnosis not present

## 2018-07-23 DIAGNOSIS — R41841 Cognitive communication deficit: Secondary | ICD-10-CM | POA: Diagnosis not present

## 2018-07-25 ENCOUNTER — Encounter: Payer: Self-pay | Admitting: Family Medicine

## 2018-07-25 ENCOUNTER — Ambulatory Visit (INDEPENDENT_AMBULATORY_CARE_PROVIDER_SITE_OTHER): Payer: Medicare HMO | Admitting: Family Medicine

## 2018-07-25 VITALS — BP 138/80 | HR 84 | Temp 98.2°F | Ht 64.0 in | Wt 142.0 lb

## 2018-07-25 DIAGNOSIS — I10 Essential (primary) hypertension: Secondary | ICD-10-CM | POA: Diagnosis not present

## 2018-07-25 DIAGNOSIS — Z23 Encounter for immunization: Secondary | ICD-10-CM | POA: Diagnosis not present

## 2018-07-25 DIAGNOSIS — M159 Polyosteoarthritis, unspecified: Secondary | ICD-10-CM

## 2018-07-25 DIAGNOSIS — F418 Other specified anxiety disorders: Secondary | ICD-10-CM | POA: Diagnosis not present

## 2018-07-25 DIAGNOSIS — R69 Illness, unspecified: Secondary | ICD-10-CM | POA: Diagnosis not present

## 2018-07-25 DIAGNOSIS — M150 Primary generalized (osteo)arthritis: Secondary | ICD-10-CM | POA: Diagnosis not present

## 2018-07-25 NOTE — Progress Notes (Signed)
   Subjective:    Patient ID: Virginia Franklin, female    DOB: 1929-11-12, 84 y.o.   MRN: [id number]  HPI Here to follow up. She feels great except for joint pains. Her moods are stable. Her BP is stable. Her appetite is good.    Review of Systems  Constitutional: Negative.   Respiratory: Negative.   Cardiovascular: Negative.   Musculoskeletal: Positive for arthralgias.  Neurological: Negative.        Objective:   Physical Exam  Constitutional: She is oriented to person, place, and time. She appears well-developed and well-nourished.  Cardiovascular: Normal rate, regular rhythm, normal heart sounds and intact distal pulses.  Pulmonary/Chest: Effort normal and breath sounds normal.  Neurological: She is alert and oriented to person, place, and time.          Assessment & Plan:  She is doing well. Her HTN and depression are stable. She will see Dr. Delice Lesch on 08-12-18 and Dr. Daneen Schick on 10-02-18.  Alysia Penna, MD

## 2018-08-07 ENCOUNTER — Encounter (HOSPITAL_COMMUNITY): Payer: Self-pay

## 2018-08-07 ENCOUNTER — Emergency Department (HOSPITAL_COMMUNITY)
Admission: EM | Admit: 2018-08-07 | Discharge: 2018-08-07 | Disposition: A | Discharge status: Home / Self Care | Payer: Medicare HMO | Attending: Emergency Medicine | Admitting: Emergency Medicine

## 2018-08-07 ENCOUNTER — Emergency Department (HOSPITAL_COMMUNITY): Payer: Medicare HMO

## 2018-08-07 DIAGNOSIS — F0280 Dementia in other diseases classified elsewhere without behavioral disturbance: Secondary | ICD-10-CM | POA: Diagnosis not present

## 2018-08-07 DIAGNOSIS — Z79899 Other long term (current) drug therapy: Secondary | ICD-10-CM | POA: Diagnosis not present

## 2018-08-07 DIAGNOSIS — Z87891 Personal history of nicotine dependence: Secondary | ICD-10-CM | POA: Insufficient documentation

## 2018-08-07 DIAGNOSIS — Z7901 Long term (current) use of anticoagulants: Secondary | ICD-10-CM | POA: Diagnosis not present

## 2018-08-07 DIAGNOSIS — Z8673 Personal history of transient ischemic attack (TIA), and cerebral infarction without residual deficits: Secondary | ICD-10-CM | POA: Insufficient documentation

## 2018-08-07 DIAGNOSIS — R69 Illness, unspecified: Secondary | ICD-10-CM | POA: Diagnosis not present

## 2018-08-07 DIAGNOSIS — Z8541 Personal history of malignant neoplasm of cervix uteri: Secondary | ICD-10-CM | POA: Diagnosis not present

## 2018-08-07 DIAGNOSIS — J439 Emphysema, unspecified: Secondary | ICD-10-CM | POA: Diagnosis not present

## 2018-08-07 DIAGNOSIS — R410 Disorientation, unspecified: Secondary | ICD-10-CM | POA: Diagnosis not present

## 2018-08-07 DIAGNOSIS — G309 Alzheimer's disease, unspecified: Secondary | ICD-10-CM | POA: Diagnosis not present

## 2018-08-07 DIAGNOSIS — R4182 Altered mental status, unspecified: Secondary | ICD-10-CM | POA: Diagnosis present

## 2018-08-07 DIAGNOSIS — I10 Essential (primary) hypertension: Secondary | ICD-10-CM | POA: Insufficient documentation

## 2018-08-07 LAB — URINALYSIS, ROUTINE W REFLEX MICROSCOPIC
Bilirubin Urine: NEGATIVE
Glucose, UA: NEGATIVE mg/dL
Hgb urine dipstick: NEGATIVE
Ketones, ur: NEGATIVE mg/dL
Leukocytes, UA: NEGATIVE
Nitrite: NEGATIVE
Protein, ur: NEGATIVE mg/dL
Specific Gravity, Urine: 1.006 (ref 1.005–1.030)
pH: 8 (ref 5.0–8.0)

## 2018-08-07 LAB — CBC
HCT: 39.2 % (ref 36.0–46.0)
Hemoglobin: 13.1 g/dL (ref 12.0–15.0)
MCH: 30.3 pg (ref 26.0–34.0)
MCHC: 33.4 g/dL (ref 30.0–36.0)
MCV: 90.7 fL (ref 78.0–100.0)
Platelets: 205 K/uL (ref 150–400)
RBC: 4.32 MIL/uL (ref 3.87–5.11)
RDW: 13.9 % (ref 11.5–15.5)
WBC: 5 K/uL (ref 4.0–10.5)

## 2018-08-07 LAB — COMPREHENSIVE METABOLIC PANEL
ALT: 15 U/L (ref 0–44)
AST: 23 U/L (ref 15–41)
Albumin: 3.7 g/dL (ref 3.5–5.0)
Alkaline Phosphatase: 64 U/L (ref 38–126)
Anion gap: 9 (ref 5–15)
BILIRUBIN TOTAL: 0.6 mg/dL (ref 0.3–1.2)
BUN: 21 mg/dL (ref 8–23)
CO2: 25 mmol/L (ref 22–32)
Calcium: 9.4 mg/dL (ref 8.9–10.3)
Chloride: 105 mmol/L (ref 98–111)
Creatinine, Ser: 0.77 mg/dL (ref 0.44–1.00)
GFR calc Af Amer: >60 mL/min (ref >60)
Glucose, Bld: 97 mg/dL (ref 70–99)
POTASSIUM: 4 mmol/L (ref 3.5–5.1)
Sodium: 139 mmol/L (ref 135–145)
TOTAL PROTEIN: 6.8 g/dL (ref 6.5–8.1)

## 2018-08-07 LAB — CBG MONITORING, ED: GLUCOSE-CAPILLARY: 97 mg/dL (ref 70–99)

## 2018-08-07 NOTE — ED Provider Notes (Signed)
[place] DEPT Provider Note  CSN: [id number] Arrival date & time: 08/07/18  1654  History   Chief Complaint Chief Complaint  Patient presents with  . Altered Mental Status    HPI Virginia Franklin is a 84 y.o. female with a medical history of Alzheimer's, HTN, stroke, OA and cervical cancer who presented to the ED for AMS. Patient states that when she woke up from her nap at 5pm that she thought it was 5am in the morning. She admits that she has some memory issues for which she takes medication for. Denies recent illness, falls or head trauma. Denies fever, chest pain, SOB, urinary issues, upper respiratory complaints or any other physical complaints. She complains of knee pain from OA which she states is normal for her. Denies SI, HI and AVH.  Additional history obtained by patient's daughter who is present. She also states that the patient forgot about plans that they had today. Denies any significant change in patient's behavior, speech or physical state. The daughter states that the patient was diagnosed with Alzheimer's a couple of months ago.  Past Medical History:  Diagnosis Date  . Body mass index (BMI) of 22.0-22.9 in adult   . Cervical cancer ([place])   . H/O cervical fracture   . H/O osteoporosis   . H/O rheumatoid arthritis   . H/O: CVA (cerebrovascular accident) 2012  . H/O: osteoarthritis   . History of urinary frequency   . Hx of diverticulitis of colon   . Hypertension     Patient Active Problem List   Diagnosis Date Noted  . Depression with anxiety 07/25/2018  . Altered mental status 12/16/2017  . Spells of speech arrest   . HTN (hypertension) 10/15/2017  . Dyslipidemia 10/15/2017  . Hx of completed stroke 10/15/2017  . Osteoarthritis 10/15/2017  . Encounter for therapeutic drug monitoring 09/13/2017  . Chronic anticoagulation 09/05/2017  . Aortic valve replaced 09/05/2017    Past Surgical History:  Procedure Laterality  Date  . ABDOMINAL HYSTERECTOMY    . CARDIAC SURGERY    . CATARACT EXTRACTION    . SMALL INTESTINE SURGERY       OB History   None      Home Medications    Prior to Admission medications   Medication Sig Start Date End Date Taking? Authorizing Provider  Calcium Citrate-Vitamin D (CALCIUM + D PO) Take 2 tablets by mouth at bedtime.   Yes [provider]  escitalopram (LEXAPRO) 5 MG tablet Take 5 mg by mouth daily.   Yes [provider]  ezetimibe-simvastatin (VYTORIN) 10-40 MG tablet TAKE 1 TABLET DAILY 03/03/18  Yes [name], MD  furosemide (LASIX) 20 MG tablet Take 1 tablet (20 mg total) by mouth daily. 05/21/18  Yes [name], MD  Glucosamine-Chondroit-Vit C-Mn (GLUCOSAMINE-CHONDROITIN) CAPS Take 1 capsule by mouth daily.    Yes [provider]  lisinopril (PRINIVIL,ZESTRIL) 10 MG tablet Take 1 tablet (10 mg total) by mouth daily. 06/30/18  Yes [name], MD  memantine (NAMENDA) 10 MG tablet Take 1 tablet (10 mg total) by mouth 2 (two) times daily. Take 1 tablet at bedtime for 1 week, then increase to 1 tablet twice daily. 06/06/18  Yes Cameron Sprang, MD  Omega-3 Fatty Acids (FISH OIL) 1000 MG CAPS Take 2,000 mg by mouth every morning.   Yes [provider]  TURMERIC PO Take 3 capsules by mouth every morning. Turmeric with biophrine 500mg    Yes [provider]  vitamin B-12 (CYANOCOBALAMIN) 1000 MCG tablet Take 1,000 mcg by mouth daily.   Yes [provider]  warfarin (COUMADIN) 7.5 MG tablet Take 1 tablet by mouth every day except Fridays. Patient taking differently: Take 7.5 mg by mouth daily.  05/27/18  Yes Belva Crome, MD  acetaminophen (TYLENOL) 650 MG CR tablet Take 650 mg by mouth every 8 (eight) hours as needed for pain.    [provider]  cephALEXin (KEFLEX) 500 MG capsule Take 1 capsule (500 mg total) by mouth 3 (three) times daily. Patient not taking: Reported on 08/07/2018 04/28/18   [name], MD  diclofenac sodium (VOLTAREN) 1 % GEL Apply 2 g topically 4 (four) times daily. Patient not taking: Reported on 08/07/2018 06/06/18   [name], MD  traMADol (ULTRAM) 50 MG tablet Take 2 tablets (100 mg total) by mouth every 6 (six) hours as needed. Patient taking differently: Take 100 mg by mouth every 6 (six) hours as needed for moderate pain or severe pain.  04/23/18   [name], MD  warfarin (COUMADIN) 10 MG tablet Take as directed by [facility] Patient not taking: Reported on 08/07/2018 04/15/18   Belva Crome, MD    Family History Family History  Problem Relation Age of Onset  . Kidney disease Mother        MALIGNANT NEOPLASM  . Arthritis Father   . Heart Problems Father   . Lung disease Brother   . Heart disease Neg Hx     Social History Social History   Tobacco Use  . Smoking status: Former Research scientist (life sciences)  . Smokeless tobacco: Never Used  Substance Use Topics  . Alcohol use: Yes    Comment: SOCIALLY  . Drug use: No     Allergies   Penicillins   Review of Systems Review of Systems  Constitutional: Negative for chills and fever.  Respiratory: Negative.   Cardiovascular: Negative.   Gastrointestinal: Negative.   Genitourinary: Negative.   Musculoskeletal: Positive for arthralgias.  Skin: Negative.   Neurological: Negative.   Hematological: Bruises/bleeds easily.  Psychiatric/Behavioral: Positive for confusion. Negative for agitation, behavioral problems and decreased concentration.   Physical Exam Updated Vital Signs BP (!) 160/75 (BP Location: Left Arm)   Pulse 60   Temp 98.3 F (36.8 C) (Oral)   Resp 14   SpO2 97%   Physical Exam  Constitutional: She appears well-developed and well-nourished. She is cooperative.  HENT:  Head: Normocephalic and atraumatic.  Mouth/Throat: Uvula is midline, oropharynx is clear and moist and mucous membranes are normal.  Eyes: Pupils are equal, round, and reactive to light. Conjunctivae, EOM and lids are  normal.  Neck: Normal range of motion and full passive range of motion without pain. Neck supple. No spinous process tenderness and no muscular tenderness present. Normal range of motion present.  Cardiovascular: Normal rate, regular rhythm, normal heart sounds, intact distal pulses and normal pulses.  No murmur heard. Neurological: She is alert.  Nursing note and vitals reviewed.    ED Treatments / Results  Labs (all labs ordered are listed, but only abnormal results are displayed) Labs Reviewed  COMPREHENSIVE METABOLIC PANEL  CBC  URINALYSIS, ROUTINE W REFLEX MICROSCOPIC  CBG MONITORING, ED  CBG MONITORING, ED    EKG None  Radiology Dg Chest 2 View  Result Date: 08/07/2018 CLINICAL DATA:  Confusion and weakness EXAM: CHEST - 2 VIEW COMPARISON:  12/16/2017 FINDINGS: Mild emphysematous hyperinflation of the lungs. Borderline cardiomegaly  with moderate aortic atherosclerosis. Median sternotomy sutures are on remarkable. No acute pulmonary consolidation or CHF. No pneumothorax or effusion. No acute osseous abnormality. Thoracic spondylosis of the dorsal spine. IMPRESSION: Emphysematous hyperinflation of the lungs. Moderate aortic atherosclerosis. No active pulmonary disease. Electronically Signed   By: Ashley Royalty M.D.   On: 08/07/2018 19:43   Ct Head Wo Contrast  Result Date: 08/07/2018 CLINICAL DATA:  Confusion EXAM: CT HEAD WITHOUT CONTRAST TECHNIQUE: Contiguous axial images were obtained from the base of the skull through the vertex without intravenous contrast. COMPARISON:  12/16/2017 FINDINGS: Brain: There is atrophy and chronic small vessel disease changes. No acute intracranial abnormality. Specifically, no hemorrhage, hydrocephalus, mass lesion, acute infarction, or significant intracranial injury. Vascular: No hyperdense vessel or unexpected calcification. Skull: No acute calvarial abnormality. Sinuses/Orbits: Visualized paranasal sinuses and mastoids clear. Orbital soft tissues  unremarkable. Other: None IMPRESSION: No acute intracranial abnormality. Atrophy, chronic microvascular disease. Electronically Signed   By: Rolm Baptise M.D.   On: 08/07/2018 19:48    Procedures Procedures (including critical care time)  Medications Ordered in ED Medications - No data to display   Initial Impression / Assessment and Plan / ED Course  Triage vital signs and the nursing notes have been reviewed.  Pertinent labs & imaging results that were available during care of the patient were reviewed and considered in medical decision making (see chart for details).  Patient with a history of Alzheimer's presents to the ED for confusion. Patient is afebrile, alert and cooperative. She is oriented x3. Patient is able to articulate that she got her times mixed up this morning and admits to being diagnosed with dementia. She is currently on Coumadin. Denies any recent head trauma, falls or injuries. Physical exam unremarkable. No focal neuro complaints in history or exam. AMS present today is likely 2/2 pt's advancing dementia. However, will order CT head for evaluation for an intracranial etiology to complaints. Will also proceed with typical lab work and evaluation for AMS to identify any possible infectious or metabolic etiology to complaints.   Clinical Course as of Aug 07 2042  Thu Aug 07, 2018  1941 Case discussed with Dr. Duffy Bruce. Labs and UA unremarkable.   [GM]  2008 Changes consistent with emphysema seen on CXR. No evidence of acute processes such as PNA. No acute findings on CT head. Chronic microvascular changes seen.   [GM]  2042 EKG reviewed. NSR. No ST changes or signs of acute ischemia/infarct.   [GM]    Clinical Course User Index [GM] Michelene Keniston, Jonelle Sports, PA-C    Final Clinical Impressions(s) / ED Diagnoses  1. Alzheimer's Dementia. Confusion today is progression of patient's illness. Thorough education provided to patient and patient's family. Advised to  follow-up with PCP regarding medications for dementia and necessary adjustments.  Dispo: Home. After thorough clinical evaluation, this patient is determined to be medically stable and can be safely discharged with the previously mentioned treatment and/or outpatient follow-up/referral(s). At this time, there are no other apparent medical conditions that require further screening, evaluation or treatment.   Final diagnoses:  Alzheimer's dementia without behavioral disturbance, unspecified timing of dementia onset [facility])    ED Discharge Orders    None        Junita Push 08/07/18 2043    Duffy Bruce, MD 08/09/18 1215

## 2018-08-07 NOTE — ED Notes (Signed)
Bed: WA17 Expected date:  Expected time:  Means of arrival:  Comments: EMS new confusion

## 2018-08-07 NOTE — ED Triage Notes (Signed)
Patient BIB EMS from [place] with complaints of confusion. Patient reports she woke up at 1700 and thought it was 0500 in the morning. Patient was diagnosed with alzheimer's within the last 2 months and does not remember her diagnosis. Patient alert to person, place, and situation, but is unsure of the year. Patient does take warfarin and has not taken any of her medications today. Patient has artificial mitral valve. Patient has history of Afib and HTN.  EMS VS 166/76, 90HR, 18RR, 114 CBG, 98.2 F.

## 2018-08-07 NOTE — Discharge Instructions (Addendum)
Your blood work, chest x-ray and head scan all came back normal.  Please follow-up with your PCP regarding any medication adjustments for the memory loss/Alzheimer's.  Thank you for allowing [place] to take care of you today!

## 2018-08-07 NOTE — ED Notes (Signed)
Bed: OH60 Expected date:  Expected time:  Means of arrival:  Comments: EMS 80s fall, v hot

## 2018-08-12 ENCOUNTER — Ambulatory Visit (INDEPENDENT_AMBULATORY_CARE_PROVIDER_SITE_OTHER): Payer: Medicare HMO | Admitting: Neurology

## 2018-08-12 ENCOUNTER — Encounter: Payer: Self-pay | Admitting: Neurology

## 2018-08-12 VITALS — BP 120/70 | HR 75 | Ht 64.0 in | Wt 145.0 lb

## 2018-08-12 DIAGNOSIS — G301 Alzheimer's disease with late onset: Secondary | ICD-10-CM

## 2018-08-12 DIAGNOSIS — R69 Illness, unspecified: Secondary | ICD-10-CM | POA: Diagnosis not present

## 2018-08-12 DIAGNOSIS — F0280 Dementia in other diseases classified elsewhere without behavioral disturbance: Secondary | ICD-10-CM | POA: Diagnosis not present

## 2018-08-12 MED ORDER — MEMANTINE HCL 10 MG PO TABS: 10.0000 mg | ORAL_TABLET | Freq: Two times a day (BID) | ORAL | 3 refills | Status: DC

## 2018-08-12 NOTE — Patient Instructions (Signed)
1. Continue Memantine 10mg  twice a day 2. Continue to monitor medications 3. No need to go to the ER each time we wake up confused, if no improvement in 6 hours, go to ER 4. Follow-up in 6 months or so, call for any changes  FALL PRECAUTIONS: Be cautious when walking. Scan the area for obstacles that may increase the risk of trips and falls. When getting up in the mornings, sit up at the edge of the bed for a few minutes before getting out of bed. Consider elevating the bed at the head end to avoid drop of blood pressure when getting up. Walk always in a well-lit room (use night lights in the walls). Avoid area rugs or power cords from appliances in the middle of the walkways. Use a walker or a cane if necessary and consider physical therapy for balance exercise. Get your eyesight checked regularly.  FINANCIAL OVERSIGHT: Supervision, especially oversight when making financial decisions or transactions is also recommended.  HOME SAFETY: Consider the safety of the kitchen when operating appliances like stoves, microwave oven, and blender. Consider having supervision and share cooking responsibilities until no longer able to participate in those. Accidents with firearms and other hazards in the house should be identified and addressed as well.  DRIVING: Regarding driving, in patients with progressive memory problems, driving will be impaired. We advise to have someone else do the driving if trouble finding directions or if minor accidents are reported. Independent driving assessment is available to determine safety of driving.  ABILITY TO BE LEFT ALONE: If patient is unable to contact 911 operator, consider using LifeLine, or when the need is there, arrange for someone to stay with patients. Smoking is a fire hazard, consider supervision or cessation. Risk of wandering should be assessed by caregiver and if detected at any point, supervision and safe proof recommendations should be  instituted.  MEDICATION SUPERVISION: Inability to self-administer medication needs to be constantly addressed. Implement a mechanism to ensure safe administration of the medications.  RECOMMENDATIONS FOR ALL PATIENTS WITH MEMORY PROBLEMS: 1. Continue to exercise (Recommend 30 minutes of walking everyday, or 3 hours every week) 2. Increase social interactions - continue going to [place] and enjoy social gatherings with friends and family 3. Eat healthy, avoid fried foods and eat more fruits and vegetables 4. Maintain adequate blood pressure, blood sugar, and blood cholesterol level. Reducing the risk of stroke and cardiovascular disease also helps promoting better memory. 5. Avoid stressful situations. Live a simple life and avoid aggravations. Organize your time and prepare for the next day in anticipation. 6. Sleep well, avoid any interruptions of sleep and avoid any distractions in the bedroom that may interfere with adequate sleep quality 7. Avoid sugar, avoid sweets as there is a strong link between excessive sugar intake, diabetes, and cognitive impairment The Mediterranean diet has been shown to help patients reduce the risk of progressive memory disorders and reduces cardiovascular risk. This includes eating fish, eat fruits and green leafy vegetables, nuts like almonds and hazelnuts, walnuts, and also use olive oil. Avoid fast foods and fried foods as much as possible. Avoid sweets and sugar as sugar use has been linked to worsening of memory function.  There is always a concern of gradual progression of memory problems. If this is the case, then we may need to adjust level of care according to patient needs. Support, both to the patient and caregiver, should then be put into place.

## 2018-08-12 NOTE — Progress Notes (Signed)
NEUROLOGY FOLLOW UP OFFICE NOTE  Virginia Franklin [id number] 05-02-1929  HISTORY OF PRESENT ILLNESS: I had the pleasure of seeing Virginia Franklin in follow-up in the neurology clinic on 08/12/2018.  The patient was last seen 7 months ago for mild dementia. She is again accompanied by her daughter who helps supplement the history today.  Records and images were personally reviewed where available. Her 24-hour EEG was normal, however typical events were not captured. She was in the ER last week after another episode of confusion upon awakening from her afternoon nap. She thought it was the day before, she could not remember if she had taken her morning medications. She had done several exercises that day and was more tired. The prolonged EEG had been done for these episodes of confusion that have mostly occurred upon awakening. She had a normal head CT, infectious workup negative. She also recently completed Neurocognitive testing which showed mild dementia, most likely due to Alzheimer's disease, but cannot rule out mixed dementia. She had GI side effects on Donepezil, and is tolerating Memantine 10mg  BID better. She continues to live in independent living and manages her own medications. Her daughter checks behind her. She does not drive. Daughter manages finances. She is independent with dressing and bathing. She denies any headaches, dizziness, vision changes, focal numbness/tingling/weakness, no falls. She has some arthritis pains.   History on Initial Assessment 01/28/2018: This is an 84 year old right-handed woman with a history of aortic valve replacement on anticoagulation with Coumadin, hypertension, cervical cancer, presenting after hospitalization on 12/16/17 for altered mental status.  Records were reviewed. Patient had reported that a few days prior to admission, she had an episode where she was unable to recall different things in the house. This lasted for a few hours then resolved. On  12/16/17, she was at the Coumadin clinic having her INR checked, when she had an episode where she was reported to zone out and was non-verbal. She did not recall the episode. She recalls having her finger pricked, she denies feeling dizzy or any other warning symptoms. Per notes it lasted 45 seconds, but her daughter reports she was still not back to baseline when she arrived in the hospital. She could not say what year it was or who the president is. Her daughter had reported that over the past 2 years, she has had around 5 episodes of confusion and inability to recall events. She saw a neurologist in [place], per daughter workup showed electrolyte abnormalities and prior strokes. These usually occur upon awakening, it takes her 20-30 minutes to get her bearings. Her daughter has not witnessed them, but her son had reported that she was not quite herself. She drank her electrolyte drink and within 20 minutes was back to normal. At [facility], she had an MRI brain without contrast which I personally reviewed, no acute changes, there was moderate chronic microvascular disease and volume loss. Her wake and sleep EEG was within normal limits. It was felt that she had some underlying dementia and Aricept was recommended, she has not started this.  She moved to [facility] in October 2018 and has been living in a retirement community at [facility]. Her daughter reported progressive memory problems over the past 5-7 years, worse in the past couple of years. She has gotten lost driving a couple of times and has stopped driving since moving to [place]. She feels her memory is not good, she is forgetting quite a bit. Her daughter took over her  finances in November 2018. She manages her own medications, although her daughter reports that one time she checked and a couple of things were off with her medication, she missed an entire day of her pillbox. Her daughter comes twice a week to visit.   She has occasional vertigo from inner  ear issues. She has occasional trouble swallowing, neck and back pain, urinary incontinence, constipation. She has occasional tremor on her right hand when painting. She denies any headaches, dizziness, diplopia, dysarthria, focal numbness/tingling/weakness, anosmia. She still feels that her thinking is not straight. She is still forgetting things and repeating conversations. Se denies any olfactory/gustatory hallucinations, deja vu, rising epigastric sensation, focal numbness/tingling/weakness, myoclonic jerks. She had a normal birth and early development.  There is no history of febrile convulsions, CNS infections such as meningitis/encephalitis, significant traumatic brain injury, neurosurgical procedures, or family history of seizures.  PAST MEDICAL HISTORY: Past Medical History:  Diagnosis Date  . Body mass index (BMI) of 22.0-22.9 in adult   . Cervical cancer ([place])   . H/O cervical fracture   . H/O osteoporosis   . H/O rheumatoid arthritis   . H/O: CVA (cerebrovascular accident) 2012  . H/O: osteoarthritis   . History of urinary frequency   . Hx of diverticulitis of colon   . Hypertension     MEDICATIONS: Current Outpatient Medications on File Prior to Visit  Medication Sig Dispense Refill  . acetaminophen (TYLENOL) 650 MG CR tablet Take 650 mg by mouth every 8 (eight) hours as needed for pain.    . Calcium Citrate-Vitamin D (CALCIUM + D PO) Take 2 tablets by mouth at bedtime.    . cephALEXin (KEFLEX) 500 MG capsule Take 1 capsule (500 mg total) by mouth 3 (three) times daily. (Patient not taking: Reported on 08/07/2018) 21 capsule 0  . diclofenac sodium (VOLTAREN) 1 % GEL Apply 2 g topically 4 (four) times daily. (Patient not taking: Reported on 08/07/2018) 100 g 3  . escitalopram (LEXAPRO) 5 MG tablet Take 5 mg by mouth daily.    Marland Kitchen ezetimibe-simvastatin (VYTORIN) 10-40 MG tablet TAKE 1 TABLET DAILY 90 tablet 3  . furosemide (LASIX) 20 MG tablet Take 1 tablet (20 mg total) by mouth  daily. 30 tablet 0  . Glucosamine-Chondroit-Vit C-Mn (GLUCOSAMINE-CHONDROITIN) CAPS Take 1 capsule by mouth daily.     Marland Kitchen lisinopril (PRINIVIL,ZESTRIL) 10 MG tablet Take 1 tablet (10 mg total) by mouth daily. 90 tablet 2  . memantine (NAMENDA) 10 MG tablet Take 1 tablet (10 mg total) by mouth 2 (two) times daily. Take 1 tablet at bedtime for 1 week, then increase to 1 tablet twice daily. 180 tablet 1  . Omega-3 Fatty Acids (FISH OIL) 1000 MG CAPS Take 2,000 mg by mouth every morning.    . traMADol (ULTRAM) 50 MG tablet Take 2 tablets (100 mg total) by mouth every 6 (six) hours as needed. (Patient taking differently: Take 100 mg by mouth every 6 (six) hours as needed for moderate pain or severe pain. ) 120 tablet 2  . TURMERIC PO Take 3 capsules by mouth every morning. Turmeric with biophrine 500mg     . vitamin B-12 (CYANOCOBALAMIN) 1000 MCG tablet Take 1,000 mcg by mouth daily.    Marland Kitchen warfarin (COUMADIN) 10 MG tablet Take as directed by Coumadin Clinic (Patient not taking: Reported on 08/07/2018) 90 tablet 0  . warfarin (COUMADIN) 7.5 MG tablet Take 1 tablet by mouth every day except Fridays. (Patient taking differently: Take 7.5 mg by mouth daily. )  35 tablet 3   No current facility-administered medications on file prior to visit.     ALLERGIES: Allergies  Allergen Reactions  . Penicillins Swelling    Has patient had a PCN reaction causing immediate rash, facial/tongue/throat swelling, SOB or lightheadedness with hypotension: Yes Has patient had a PCN reaction causing severe rash involving mucus membranes or skin necrosis: No Has patient had a PCN reaction that required hospitalization: No Has patient had a PCN reaction occurring within the last 10 years: No If all of the above answers are "NO", then may proceed with Cephalosporin use.    FAMILY HISTORY: Family History  Problem Relation Age of Onset  . Kidney disease Mother        MALIGNANT NEOPLASM  . Arthritis Father   . Heart Problems  Father   . Lung disease Brother   . Heart disease Neg Hx     SOCIAL HISTORY: Social History   Socioeconomic History  . Marital status: Widowed    Spouse name: Not on file  . Number of children: 2  . Years of education: 68  . Highest education level: Not on file  Occupational History  . Not on file  Social Needs  . Financial resource strain: Not on file  . Food insecurity:    Worry: Not on file    Inability: Not on file  . Transportation needs:    Medical: Not on file    Non-medical: Not on file  Tobacco Use  . Smoking status: Former Research scientist (life sciences)  . Smokeless tobacco: Never Used  Substance and Sexual Activity  . Alcohol use: Yes    Comment: SOCIALLY  . Drug use: No  . Sexual activity: Not on file    Comment: WIDOW  Lifestyle  . Physical activity:    Days per week: Not on file    Minutes per session: Not on file  . Stress: Not on file  Relationships  . Social connections:    Talks on phone: Not on file    Gets together: Not on file    Attends religious service: Not on file    Active member of club or organization: Not on file    Attends meetings of clubs or organizations: Not on file    Relationship status: Not on file  . Intimate partner violence:    Fear of current or ex partner: Not on file    Emotionally abused: Not on file    Physically abused: Not on file    Forced sexual activity: Not on file  Other Topics Concern  . Not on file  Social History Narrative   Lives alone in a retirement home.  Has 2 children.  Retired Haematologist.  Education: Probation officer school.     REVIEW OF SYSTEMS: Constitutional: No fevers, chills, or sweats, no generalized fatigue, change in appetite Eyes: No visual changes, double vision, eye pain Ear, nose and throat: No hearing loss, ear pain, nasal congestion, sore throat Cardiovascular: No chest pain, palpitations Respiratory:  No shortness of breath at rest or with exertion, wheezes GastrointestinaI: No nausea, vomiting,  diarrhea, abdominal pain, fecal incontinence Genitourinary:  No dysuria, urinary retention or frequency Musculoskeletal:  + neck pain, back pain Integumentary: No rash, pruritus, skin lesions Neurological: as above Psychiatric: No depression, insomnia, anxiety Endocrine: No palpitations, fatigue, diaphoresis, mood swings, change in appetite, change in weight, increased thirst Hematologic/Lymphatic:  No anemia, purpura, petechiae. Allergic/Immunologic: no itchy/runny eyes, nasal congestion, recent allergic reactions, rashes  PHYSICAL EXAM: Vitals:  08/12/18 1330  BP: 120/70  Pulse: 75  SpO2: 94%   General: No acute distress Head:  Normocephalic/atraumatic Neck: supple, no paraspinal tenderness, full range of motion Heart:  Regular rate and rhythm Lungs:  Clear to auscultation bilaterally Back: No paraspinal tenderness Skin/Extremities: No rash, no edema Neurological Exam: alert and oriented to person, place, and time. No aphasia or dysarthria. Fund of knowledge is appropriate.  Recent and remote memory are intact.  Attention and concentration are normal.    Able to name objects and repeat phrases. Cranial nerves: Pupils equal, round, reactive to light.  Extraocular movements intact with no nystagmus. Visual fields full. Facial sensation intact. No facial asymmetry. Tongue, uvula, palate midline.  Motor: Bulk and tone normal, muscle strength 5/5 throughout with no pronator drift.  Sensation to light touch intact.  No extinction to double simultaneous stimulation.  Finger to nose testing intact.  Gait narrow-based and steady, able to tandem walk adequately.  Romberg negative.  IMPRESSION: This is an 84 yo RH woman with a history of aortic valve replacement on anticoagulation with Coumadin, hypertension, cervical cancer, with mild dementia, likely Alzheimers but cannot rule out mixed dementia. She is taking Memantine 10mg  BID. She initially presented for recurrent episodes of confusion,  mostly upon awakening. MRI brain and 24-hour EEG normal. Episodes likely related to underlying dementia, we discussed the importance of routine, effects of sleep deprivation/stress/infection. We discussed continued monitoring of medications. She is interested in a LifeAlert necklace. We discussed the importance of control of vascular risk factors, physical exercise, and brain stimulation exercises for brain health. Follow-up in 6 months, they know to call for any changes.   Thank you for allowing me to participate in her her care.  Please do not hesitate to call for any questions or concerns.  The duration of this appointment visit was 20 minutes of face-to-face time with the patient.  Greater than 50% of this time was spent in counseling, explanation of diagnosis, planning of further management, and coordination of care.   Ellouise Newer, M.D.   CC: Dr. Sarajane Jews

## 2018-08-26 ENCOUNTER — Ambulatory Visit (INDEPENDENT_AMBULATORY_CARE_PROVIDER_SITE_OTHER): Payer: Medicare HMO

## 2018-08-26 DIAGNOSIS — Z5181 Encounter for therapeutic drug level monitoring: Secondary | ICD-10-CM | POA: Diagnosis not present

## 2018-08-26 DIAGNOSIS — Z952 Presence of prosthetic heart valve: Secondary | ICD-10-CM

## 2018-08-26 DIAGNOSIS — Z7901 Long term (current) use of anticoagulants: Secondary | ICD-10-CM

## 2018-08-26 LAB — POCT INR: INR: 2.7 (ref 2.0–3.0)

## 2018-08-26 NOTE — Patient Instructions (Signed)
Description   Continue on same dosage 7.5mg  daily. Recheck in 6 weeks. Coumadin Clinic [phone] Main [phone].

## 2018-09-04 ENCOUNTER — Encounter: Payer: Self-pay | Admitting: Interventional Cardiology

## 2018-09-26 ENCOUNTER — Other Ambulatory Visit: Payer: Self-pay | Admitting: *Deleted

## 2018-09-26 MED ORDER — WARFARIN SODIUM 10 MG PO TABS: ORAL_TABLET | ORAL | 1 refills | Status: DC

## 2018-09-26 MED ORDER — WARFARIN SODIUM 7.5 MG PO TABS: ORAL_TABLET | ORAL | 1 refills | Status: DC

## 2018-09-26 NOTE — Telephone Encounter (Addendum)
Warfarin 7.5mg  refill request sent. The pt is not using 10mg  Warfarin, therefore, called the pt and instructed [facility] not to fill the 10mg  because pt is not using but the 7.5mg  tablet should be filled. Also, removed the Warfarin 10mg  off the medication list. Alena with [facility] stated they will ship the 7.5mg  out.

## 2018-09-26 NOTE — Addendum Note (Signed)
Addended by: Derrel Nip B on: 09/26/2018 02:36 PM   Modules accepted: Orders

## 2018-10-01 ENCOUNTER — Other Ambulatory Visit: Payer: Self-pay | Admitting: *Deleted

## 2018-10-01 MED ORDER — WARFARIN SODIUM 7.5 MG PO TABS: ORAL_TABLET | ORAL | 1 refills | Status: DC

## 2018-10-02 ENCOUNTER — Ambulatory Visit: Payer: Medicare HMO | Admitting: Interventional Cardiology

## 2018-10-03 ENCOUNTER — Ambulatory Visit: Payer: Medicare HMO | Admitting: Interventional Cardiology

## 2018-10-07 ENCOUNTER — Ambulatory Visit (INDEPENDENT_AMBULATORY_CARE_PROVIDER_SITE_OTHER): Payer: Medicare HMO

## 2018-10-07 DIAGNOSIS — Z5181 Encounter for therapeutic drug level monitoring: Secondary | ICD-10-CM | POA: Diagnosis not present

## 2018-10-07 DIAGNOSIS — Z952 Presence of prosthetic heart valve: Secondary | ICD-10-CM

## 2018-10-07 DIAGNOSIS — Z7901 Long term (current) use of anticoagulants: Secondary | ICD-10-CM

## 2018-10-07 LAB — POCT INR: INR: 3.5 — AB (ref 2.0–3.0)

## 2018-10-07 NOTE — Patient Instructions (Signed)
Please have a serving of greens today and continue on same dosage 7.5mg  daily. Recheck in 6 weeks. Coumadin Clinic [phone] Main [phone].

## 2018-11-07 ENCOUNTER — Encounter: Payer: Self-pay | Admitting: Interventional Cardiology

## 2018-12-07 NOTE — Progress Notes (Signed)
Cardiology Office Note:    Date:  12/08/2018   ID:  Antara Brecheisen [place], DOB 1928/12/13, MRN [id number]  PCP:  [name], MD  Cardiologist:  Sinclair Grooms, MD   Referring MD: [name], MD   Chief Complaint  Patient presents with  . Cardiac Valve Problem  . Follow-up    Coagulation    History of Present Illness:    Virginia Franklin is a 85 y.o. female with a hx of essential hypertension, embolic CVA,mechanical St. Jude aortic valve for rheumatic fever 1990, and chronic coumadin therapy.   She is doing well.  Review of interval records demonstrate increasing difficulty with memory and confusion.  She has been started on Namenda.  She had an acute emergency room visit because she awakened confused after a nap in September  She has not had cardiac complaints.  She specifically denies orthopnea, PND, chest pain, prolonged palpitations, and syncope.  She does note discoloration and mild swelling of the lower extremities.  No acute neurological events of focal nature.  No blood in the urine and stool.  Past Medical History:  Diagnosis Date  . Body mass index (BMI) of 22.0-22.9 in adult   . Cervical cancer ([place])   . H/O cervical fracture   . H/O osteoporosis   . H/O rheumatoid arthritis   . H/O: CVA (cerebrovascular accident) 2012  . H/O: osteoarthritis   . History of urinary frequency   . Hx of diverticulitis of colon   . Hypertension     Past Surgical History:  Procedure Laterality Date  . ABDOMINAL HYSTERECTOMY    . CARDIAC SURGERY    . CATARACT EXTRACTION    . SMALL INTESTINE SURGERY      Current Medications: Current Meds  Medication Sig  . acetaminophen (TYLENOL) 650 MG CR tablet Take 650 mg by mouth every 8 (eight) hours as needed for pain.  Marland Kitchen b complex vitamins tablet Take 2 tablets by mouth daily.  . Calcium Citrate-Vitamin D (CALCIUM + D PO) Take 2 tablets by mouth at bedtime.  . diclofenac sodium (VOLTAREN) 1 % GEL Apply 2 g topically 4  (four) times daily.  Marland Kitchen escitalopram (LEXAPRO) 5 MG tablet Take 5 mg by mouth daily.  Marland Kitchen ezetimibe-simvastatin (VYTORIN) 10-40 MG tablet TAKE 1 TABLET DAILY  . furosemide (LASIX) 20 MG tablet Take 1 tablet (20 mg total) by mouth daily.  Marland Kitchen lisinopril (PRINIVIL,ZESTRIL) 10 MG tablet Take 1 tablet (10 mg total) by mouth daily.  . memantine (NAMENDA) 10 MG tablet Take 1 tablet (10 mg total) by mouth 2 (two) times daily. Take 1 tablet twice a day  . Omega-3 Fatty Acids (FISH OIL) 1000 MG CAPS Take 2,000 mg by mouth every morning.  . TURMERIC PO Take 3 capsules by mouth every morning. Turmeric with biophrine 500mg   . warfarin (COUMADIN) 7.5 MG tablet Take 1 tablet by mouth every day except Fridays.     Allergies:   Penicillins   Social History   Socioeconomic History  . Marital status: Widowed    Spouse name: Not on file  . Number of children: 2  . Years of education: 51  . Highest education level: Not on file  Occupational History  . Not on file  Social Needs  . Financial resource strain: Not on file  . Food insecurity:    Worry: Not on file    Inability: Not on file  . Transportation needs:    Medical: Not on file  Non-medical: Not on file  Tobacco Use  . Smoking status: Former Research scientist (life sciences)  . Smokeless tobacco: Never Used  Substance and Sexual Activity  . Alcohol use: Yes    Comment: SOCIALLY  . Drug use: No  . Sexual activity: Not on file    Comment: WIDOW  Lifestyle  . Physical activity:    Days per week: Not on file    Minutes per session: Not on file  . Stress: Not on file  Relationships  . Social connections:    Talks on phone: Not on file    Gets together: Not on file    Attends religious service: Not on file    Active member of club or organization: Not on file    Attends meetings of clubs or organizations: Not on file    Relationship status: Not on file  Other Topics Concern  . Not on file  Social History Narrative   Lives alone in a retirement home.  Has 2  children.  Retired Haematologist.  Education: Probation officer school.      Family History: The patient's family history includes Arthritis in her father; Heart Problems in her father; Kidney disease in her mother; Lung disease in her brother. There is no history of Heart disease.  ROS:   Please see the history of present illness.    Did confuse her Coumadin dosing but this is been corrected at today's Coumadin clinic.  Some difficulty with balance and walking.  Constipation.  Occasional sweating.  Easy bruising.  All other systems reviewed and are negative.  EKGs/Labs/Other Studies Reviewed:    The following studies were reviewed today: 2D Doppler echocardiogram January 10, 2018: Study Conclusions  - Left ventricle: The cavity size was normal. Wall thickness was   increased in a pattern of mild LVH. Systolic function was normal.   The estimated ejection fraction was in the range of 60% to 65%.   Wall motion was normal; there were no regional wall motion   abnormalities. Doppler parameters are consistent with abnormal   left ventricular relaxation (grade 1 diastolic dysfunction). - Aortic valve: St Jude mechanical aortic valve. No significant   stenosis of the mechanical aortic valve. There was trivial   regurgitation. Mean gradient (S): 12 mm Hg. - Ascending aorta: The ascending aorta was mildly dilated to 4.0   cm. - Mitral valve: Mildly calcified annulus. There was no significant   regurgitation. - Right ventricle: The cavity size was normal. Systolic function   was normal. - Pulmonary arteries: No complete TR doppler jet so unable to   estimate PA systolic pressure. - Inferior vena cava: The vessel was normal in size. The   respirophasic diameter changes were in the normal range (>= 50%),   consistent with normal central venous pressure.  Impressions:  - Normal LV size with mild LV hypertrophy. EF 60-65%. Normal RV   size and systolic function. St Jude mechanical aortic valve  was   functioning normally.  EKG:  EKG sinus bradycardia with poor R wave progression.  This is an EKG tracing performed in the [facility] long emergency room on August 07, 2018.  The tracing was personally reviewed.  Recent Labs: 12/17/2017: Magnesium 1.8 01/13/2018: TSH 1.49 08/07/2018: ALT 15; BUN 21; Creatinine, Ser 0.77; Hemoglobin 13.1; Platelets 205; Potassium 4.0; Sodium 139  Recent Lipid Panel    Component Value Date/Time   CHOL 149 01/13/2018 1219   TRIG 63.0 01/13/2018 1219   HDL 72.40 01/13/2018 1219   CHOLHDL 2  01/13/2018 1219   VLDL 12.6 01/13/2018 1219   LDLCALC 64 01/13/2018 1219    Physical Exam:    VS:  BP (!) 148/74   Pulse 76   Ht 5\' 4"  (1.626 m)   Wt 144 lb 1.9 oz (65.4 kg)   SpO2 97%   BMI 24.74 kg/m     Wt Readings from Last 3 Encounters:  12/08/18 144 lb 1.9 oz (65.4 kg)  08/12/18 145 lb (65.8 kg)  07/25/18 142 lb (64.4 kg)     GEN: Peers younger than stated age.. No acute distress HEENT: Normal NECK: No JVD. LYMPHATICS: No lymphadenopathy CARDIAC: Crisp mechanical valve closure sounds.  RRR.  1/6 to 2/6 systolic without diastolic murmur, no gallop, bilateral superficial varicosities with trace lower shin and ankle edema.  There is hyperpigmentation of the lower extremities likely secondary to venous hypertension. VASCULAR: 2+ carotid, radial, and posterior tibial pulses, no carotid bruits RESPIRATORY:  Clear to auscultation without rales, wheezing or rhonchi  ABDOMEN: Soft, non-tender, non-distended, No pulsatile mass, MUSCULOSKELETAL: No deformity  SKIN: Warm and dry NEUROLOGIC:  Alert and oriented x 3 PSYCHIATRIC:  Normal affect   ASSESSMENT:    1. H/O mechanical aortic valve replacement   2. Hyperlipidemia with target LDL less than 100   3. Obstructive sleep apnea   4. Essential hypertension   5. Hx of completed stroke   6. Chronic anticoagulation    PLAN:    In order of problems listed above:  1. Normally functioning mechanical  aortic valve.  This is documented both by echocardiography within the past year and auscultation today.   2. Current lipid levels are at target with LDL less than 70 in March 2019. 3. Not addressed 4. Blood pressure is adequately controlled for her age running around 761 mmHg systolic.  No change 5. Not addressed 6. Some difficulty remembering her medications.  Now on Namenda.  Followed in the Coumadin clinic.  INR today was 4.  She will hold tomorrow's dose.   Cardiovascular follow-up in 6 months with team member and in 1 year with me.   Medication Adjustments/Labs and Tests Ordered: Current medicines are reviewed at length with the patient today.  Concerns regarding medicines are outlined above.  No orders of the defined types were placed in this encounter.  No orders of the defined types were placed in this encounter.   Patient Instructions  Medication Instructions:  Your physician recommends that you continue on your current medications as directed. Please refer to the Current Medication list given to you today.  If you need a refill on your cardiac medications before your next appointment, please call your pharmacy.   Lab work: None ordered If you have labs (blood work) drawn today and your tests are completely normal, you will receive your results only by: Marland Kitchen MyChart Message (if you have MyChart) OR . A paper copy in the mail If you have any lab test that is abnormal or we need to change your treatment, we will call you to review the results.  Testing/Procedures: None ordered  Follow-Up: Your physician wants you to follow-up in: 6 months with an APP on Dr. Thompson Caul team. Dennis Bast will receive a reminder letter in the mail two months in advance. If you don't receive a letter, please call our office to schedule the follow-up appointment.   At [facility], you and your health needs are our priority.  As part of our continuing mission to provide you with exceptional heart care, we  have created designated Provider Care Teams.  These Care Teams include your primary Cardiologist (physician) and Advanced Practice Providers (APPs -  Physician Assistants and Nurse Practitioners) who all work together to provide you with the care you need, when you need it. . You will need a follow up appointment in 1 year.  Please call our office 2 months in advance to schedule this appointment.  You may see Sinclair Grooms, MD or one of the following Advanced Practice Providers on your designated Care Team:   . Truitt Merle, NP . Cecilie Kicks, NP . Kathyrn Drown, NP   Any Other Special Instructions Will Be Listed Below (If Applicable).     Signed, Sinclair Grooms, MD  12/08/2018 1:40 PM    [facility]

## 2018-12-08 ENCOUNTER — Ambulatory Visit (INDEPENDENT_AMBULATORY_CARE_PROVIDER_SITE_OTHER): Payer: Medicare HMO

## 2018-12-08 ENCOUNTER — Ambulatory Visit (INDEPENDENT_AMBULATORY_CARE_PROVIDER_SITE_OTHER): Payer: Medicare HMO | Admitting: Interventional Cardiology

## 2018-12-08 ENCOUNTER — Encounter: Payer: Self-pay | Admitting: Interventional Cardiology

## 2018-12-08 VITALS — BP 148/74 | HR 76 | Ht 64.0 in | Wt 144.1 lb

## 2018-12-08 DIAGNOSIS — G4733 Obstructive sleep apnea (adult) (pediatric): Secondary | ICD-10-CM | POA: Diagnosis not present

## 2018-12-08 DIAGNOSIS — Z8673 Personal history of transient ischemic attack (TIA), and cerebral infarction without residual deficits: Secondary | ICD-10-CM | POA: Diagnosis not present

## 2018-12-08 DIAGNOSIS — Z7901 Long term (current) use of anticoagulants: Secondary | ICD-10-CM | POA: Diagnosis not present

## 2018-12-08 DIAGNOSIS — Z952 Presence of prosthetic heart valve: Secondary | ICD-10-CM

## 2018-12-08 DIAGNOSIS — E785 Hyperlipidemia, unspecified: Secondary | ICD-10-CM

## 2018-12-08 DIAGNOSIS — I10 Essential (primary) hypertension: Secondary | ICD-10-CM | POA: Diagnosis not present

## 2018-12-08 LAB — POCT INR: INR: 4 — AB (ref 2.0–3.0)

## 2018-12-08 NOTE — Patient Instructions (Signed)
Description   Skip your Coumadin dose tomorrow, then continue taking 1 tablet (7.5mg ) daily. Recheck in 3 weeks. [facility] [phone] Main [phone].

## 2018-12-08 NOTE — Patient Instructions (Signed)
Medication Instructions:  Your physician recommends that you continue on your current medications as directed. Please refer to the Current Medication list given to you today.  If you need a refill on your cardiac medications before your next appointment, please call your pharmacy.   Lab work: None ordered If you have labs (blood work) drawn today and your tests are completely normal, you will receive your results only by: . MyChart Message (if you have MyChart) OR . A paper copy in the mail If you have any lab test that is abnormal or we need to change your treatment, we will call you to review the results.  Testing/Procedures: None ordered  Follow-Up: Your physician wants you to follow-up in: 6 months with an APP on Dr. Smith's team. You will receive a reminder letter in the mail two months in advance. If you don't receive a letter, please call our office to schedule the follow-up appointment.   At [facility], you and your health needs are our priority.  As part of our continuing mission to provide you with exceptional heart care, we have created designated Provider Care Teams.  These Care Teams include your primary Cardiologist (physician) and Advanced Practice Providers (APPs -  Physician Assistants and Nurse Practitioners) who all work together to provide you with the care you need, when you need it. . You will need a follow up appointment in 1 year.  Please call our office 2 months in advance to schedule this appointment.  You may see Henry W Smith III, MD or one of the following Advanced Practice Providers on your designated Care Team:   . Lori Gerhardt, NP . Laura Ingold, NP . Jill McDaniel, NP   Any Other Special Instructions Will Be Listed Below (If Applicable).  

## 2018-12-29 ENCOUNTER — Other Ambulatory Visit: Payer: Self-pay | Admitting: Interventional Cardiology

## 2018-12-29 ENCOUNTER — Ambulatory Visit (INDEPENDENT_AMBULATORY_CARE_PROVIDER_SITE_OTHER): Payer: Medicare HMO | Admitting: Pharmacist

## 2018-12-29 DIAGNOSIS — Z5181 Encounter for therapeutic drug level monitoring: Secondary | ICD-10-CM

## 2018-12-29 DIAGNOSIS — Z7901 Long term (current) use of anticoagulants: Secondary | ICD-10-CM

## 2018-12-29 DIAGNOSIS — Z952 Presence of prosthetic heart valve: Secondary | ICD-10-CM

## 2018-12-29 LAB — POCT INR: INR: 3.9 — AB (ref 2.0–3.0)

## 2018-12-29 NOTE — Patient Instructions (Signed)
Description   Skip your Coumadin dose tomorrow, then start taking 1 tablet daily except 1/2 tablet on Mondays. Recheck in 2-3 weeks. Coumadin Clinic [phone] Main [phone].

## 2019-01-05 ENCOUNTER — Encounter: Payer: Self-pay | Admitting: Family Medicine

## 2019-01-05 ENCOUNTER — Ambulatory Visit (INDEPENDENT_AMBULATORY_CARE_PROVIDER_SITE_OTHER): Payer: Medicare HMO | Admitting: Family Medicine

## 2019-01-05 VITALS — BP 132/70 | HR 60 | Temp 98.4°F | Wt 138.0 lb

## 2019-01-05 DIAGNOSIS — I10 Essential (primary) hypertension: Secondary | ICD-10-CM

## 2019-01-05 DIAGNOSIS — G309 Alzheimer's disease, unspecified: Secondary | ICD-10-CM

## 2019-01-05 DIAGNOSIS — M79674 Pain in right toe(s): Secondary | ICD-10-CM

## 2019-01-05 DIAGNOSIS — M79675 Pain in left toe(s): Secondary | ICD-10-CM | POA: Diagnosis not present

## 2019-01-05 DIAGNOSIS — G300 Alzheimer's disease with early onset: Secondary | ICD-10-CM

## 2019-01-05 DIAGNOSIS — R69 Illness, unspecified: Secondary | ICD-10-CM | POA: Diagnosis not present

## 2019-01-05 DIAGNOSIS — F418 Other specified anxiety disorders: Secondary | ICD-10-CM | POA: Diagnosis not present

## 2019-01-05 DIAGNOSIS — F0280 Dementia in other diseases classified elsewhere without behavioral disturbance: Secondary | ICD-10-CM | POA: Insufficient documentation

## 2019-01-05 NOTE — Progress Notes (Signed)
   Subjective:    Patient ID: Virginia Franklin, female    DOB: 1929-05-31, 85 y.o.   MRN: [id number]  HPI Here for several issues. First off her BP has been stable. She saw Dr. Tamala Julian on 12-08-18 and he seemed pleased with how she is doing. She does mention some recent spells of confusion. In the past few weeks she has had 3 episodes lasting several hours each where she felt a little confused. Her friends and family noticed the difference during these spells. She saw Dr. Delice Lesch in October for mild Alzheimers and she improved when she started on Namenda. In talking with her today it turns out she has been taking this only once a day however. Also she describes pain in her toes when she walks.    Review of Systems  Constitutional: Negative.   Respiratory: Negative.   Cardiovascular: Negative.   Musculoskeletal: Positive for arthralgias.  Neurological: Negative.   Psychiatric/Behavioral: Positive for confusion. Negative for agitation and behavioral problems.       Objective:   Physical Exam Constitutional:      Appearance: Normal appearance.  Cardiovascular:     Rate and Rhythm: Normal rate and regular rhythm.     Pulses: Normal pulses.     Comments: 2/6 SM as usual  Pulmonary:     Effort: Pulmonary effort is normal.     Breath sounds: Normal breath sounds.  Musculoskeletal:     Comments: There are several calluses on the tips of her toes bilaterally   Neurological:     General: No focal deficit present.     Mental Status: She is alert and oriented to person, place, and time.           Assessment & Plan:  Her HTN is stable. For the dementia, I suggested she increase the Namenda to the intended dose of 10 mg twice daily. For the foot pain we will refer her to Podiatry. Alysia Penna, MD

## 2019-01-06 ENCOUNTER — Other Ambulatory Visit: Payer: Self-pay | Admitting: Family Medicine

## 2019-01-06 MED ORDER — EZETIMIBE-SIMVASTATIN 10-40 MG PO TABS: 1.0000 | ORAL_TABLET | Freq: Every day | ORAL | 1 refills | Status: DC

## 2019-01-06 NOTE — Telephone Encounter (Signed)
Copied from [place] [phone]. Topic: Quick Communication - Rx Refill/Question >> Jan 06, 2019  1:54 PM Yvette Rack wrote: Medication: ezetimibe-simvastatin (VYTORIN) 10-40 MG tablet  Has the patient contacted their pharmacy? yes   Preferred Pharmacy (with phone number or street name): [place], [place] [phone] (Phone) [phone] (Fax)  Agent: Please be advised that RX refills may take up to 3 business days. We ask that you follow-up with your pharmacy.

## 2019-01-07 ENCOUNTER — Other Ambulatory Visit: Payer: Self-pay | Admitting: Family Medicine

## 2019-01-07 MED ORDER — EZETIMIBE-SIMVASTATIN 10-40 MG PO TABS: 1.0000 | ORAL_TABLET | Freq: Every day | ORAL | 3 refills | Status: DC

## 2019-01-07 MED ORDER — ESCITALOPRAM OXALATE 5 MG PO TABS: 5.0000 mg | ORAL_TABLET | Freq: Every day | ORAL | 3 refills | Status: DC

## 2019-01-12 ENCOUNTER — Ambulatory Visit (INDEPENDENT_AMBULATORY_CARE_PROVIDER_SITE_OTHER): Payer: Medicare HMO | Admitting: *Deleted

## 2019-01-12 DIAGNOSIS — Z952 Presence of prosthetic heart valve: Secondary | ICD-10-CM | POA: Diagnosis not present

## 2019-01-12 DIAGNOSIS — Z5181 Encounter for therapeutic drug level monitoring: Secondary | ICD-10-CM | POA: Diagnosis not present

## 2019-01-12 DIAGNOSIS — Z7901 Long term (current) use of anticoagulants: Secondary | ICD-10-CM

## 2019-01-12 LAB — POCT INR: INR: 4.1 — AB (ref 2.0–3.0)

## 2019-01-12 NOTE — Patient Instructions (Signed)
Description   Skip your Coumadin dose tomorrow, then start taking 1 tablet daily except 1/2 tablet on Mondays and Fridays. Recheck in 2 weeks. Coumadin Clinic [phone] Main [phone].

## 2019-01-14 ENCOUNTER — Telehealth: Payer: Self-pay

## 2019-01-14 DIAGNOSIS — M79669 Pain in unspecified lower leg: Principal | ICD-10-CM

## 2019-01-14 DIAGNOSIS — M25569 Pain in unspecified knee: Secondary | ICD-10-CM

## 2019-01-14 NOTE — Telephone Encounter (Signed)
Please contact Home Health to start PT for her at [facility] for bilateral leg pain

## 2019-01-14 NOTE — Telephone Encounter (Signed)
Dr. Sarajane Jews please advise on referral for PT at [facility]. thanks

## 2019-01-14 NOTE — Telephone Encounter (Signed)
Copied from [place] [id number]. Topic: Referral - Request for Referral >> Jan 14, 2019  9:02 AM Bea Graff, NT wrote: Has patient seen PCP for this complaint? Yes.   *If NO, is insurance requiring patient see PCP for this issue before PCP can refer them? Referral for which specialty: PT Preferred provider/office: [facility] Reason for referral: bilateral knee and leg pain

## 2019-01-15 NOTE — Telephone Encounter (Signed)
Referral has been placed. 

## 2019-01-19 ENCOUNTER — Ambulatory Visit (INDEPENDENT_AMBULATORY_CARE_PROVIDER_SITE_OTHER): Payer: Medicare HMO | Admitting: Podiatry

## 2019-01-19 ENCOUNTER — Encounter: Payer: Self-pay | Admitting: Podiatry

## 2019-01-19 ENCOUNTER — Ambulatory Visit: Payer: Medicare HMO

## 2019-01-19 DIAGNOSIS — M79671 Pain in right foot: Secondary | ICD-10-CM

## 2019-01-19 DIAGNOSIS — M2041 Other hammer toe(s) (acquired), right foot: Secondary | ICD-10-CM

## 2019-01-19 DIAGNOSIS — L84 Corns and callosities: Secondary | ICD-10-CM | POA: Diagnosis not present

## 2019-01-19 DIAGNOSIS — M79672 Pain in left foot: Principal | ICD-10-CM

## 2019-01-21 NOTE — Progress Notes (Signed)
Subjective:   Patient ID: Virginia Franklin, female   DOB: 85 y.o.   MRN: [id number]   HPI Patient presents with painful third digits bilateral and states she does not remember when it started to hurt but she knows that it is been a while   ROS      Objective:  Physical Exam  Neurovascular status intact with severe keratotic lesion digit 3 distal right over left with hammertoe deformity and distal contracture of the toe and elderly lady that was treated 14 months ago     Assessment:  Hammertoe deformity with distal keratotic lesion that are very painful when pressed right over left     Plan:  H&P and discussed the nature of hammertoe deformity and the distal keratotic lesions associated with it.  Today sharp sterile debridement of lesions accomplished and I applied tunnel pads to try to lift up the toes.  And I explained open hammertoe correction and educated her on what would be required from either a soft tissue or bony perspective

## 2019-01-26 ENCOUNTER — Other Ambulatory Visit: Payer: Self-pay

## 2019-01-26 ENCOUNTER — Ambulatory Visit (INDEPENDENT_AMBULATORY_CARE_PROVIDER_SITE_OTHER): Payer: Medicare HMO

## 2019-01-26 DIAGNOSIS — Z5181 Encounter for therapeutic drug level monitoring: Secondary | ICD-10-CM | POA: Diagnosis not present

## 2019-01-26 DIAGNOSIS — Z952 Presence of prosthetic heart valve: Secondary | ICD-10-CM | POA: Diagnosis not present

## 2019-01-26 DIAGNOSIS — Z7901 Long term (current) use of anticoagulants: Secondary | ICD-10-CM

## 2019-01-26 LAB — POCT INR: INR: 3.5 — AB (ref 2.0–3.0)

## 2019-01-26 NOTE — Patient Instructions (Signed)
Description   Continue on same dosage 1 tablet daily except 1/2 tablet on Mondays and Fridays. Recheck in 3 weeks. Coumadin Clinic [phone] Main [phone].

## 2019-01-27 ENCOUNTER — Ambulatory Visit: Payer: Medicare HMO | Admitting: Family Medicine

## 2019-01-29 DIAGNOSIS — M6281 Muscle weakness (generalized): Secondary | ICD-10-CM | POA: Diagnosis not present

## 2019-01-29 DIAGNOSIS — M25561 Pain in right knee: Secondary | ICD-10-CM | POA: Diagnosis not present

## 2019-01-29 DIAGNOSIS — M25562 Pain in left knee: Secondary | ICD-10-CM | POA: Diagnosis not present

## 2019-01-29 DIAGNOSIS — R2681 Unsteadiness on feet: Secondary | ICD-10-CM | POA: Diagnosis not present

## 2019-01-30 DIAGNOSIS — M25562 Pain in left knee: Secondary | ICD-10-CM | POA: Diagnosis not present

## 2019-01-30 DIAGNOSIS — M6281 Muscle weakness (generalized): Secondary | ICD-10-CM | POA: Diagnosis not present

## 2019-01-30 DIAGNOSIS — R2681 Unsteadiness on feet: Secondary | ICD-10-CM | POA: Diagnosis not present

## 2019-01-30 DIAGNOSIS — M25561 Pain in right knee: Secondary | ICD-10-CM | POA: Diagnosis not present

## 2019-02-02 DIAGNOSIS — M25561 Pain in right knee: Secondary | ICD-10-CM | POA: Diagnosis not present

## 2019-02-02 DIAGNOSIS — M6281 Muscle weakness (generalized): Secondary | ICD-10-CM | POA: Diagnosis not present

## 2019-02-02 DIAGNOSIS — M25562 Pain in left knee: Secondary | ICD-10-CM | POA: Diagnosis not present

## 2019-02-02 DIAGNOSIS — R2681 Unsteadiness on feet: Secondary | ICD-10-CM | POA: Diagnosis not present

## 2019-02-06 DIAGNOSIS — M6281 Muscle weakness (generalized): Secondary | ICD-10-CM | POA: Diagnosis not present

## 2019-02-06 DIAGNOSIS — M25561 Pain in right knee: Secondary | ICD-10-CM | POA: Diagnosis not present

## 2019-02-06 DIAGNOSIS — M25562 Pain in left knee: Secondary | ICD-10-CM | POA: Diagnosis not present

## 2019-02-06 DIAGNOSIS — R2681 Unsteadiness on feet: Secondary | ICD-10-CM | POA: Diagnosis not present

## 2019-02-09 DIAGNOSIS — M25562 Pain in left knee: Secondary | ICD-10-CM | POA: Diagnosis not present

## 2019-02-09 DIAGNOSIS — M25561 Pain in right knee: Secondary | ICD-10-CM | POA: Diagnosis not present

## 2019-02-09 DIAGNOSIS — R2681 Unsteadiness on feet: Secondary | ICD-10-CM | POA: Diagnosis not present

## 2019-02-09 DIAGNOSIS — M6281 Muscle weakness (generalized): Secondary | ICD-10-CM | POA: Diagnosis not present

## 2019-02-12 ENCOUNTER — Telehealth: Payer: Self-pay | Admitting: *Deleted

## 2019-02-12 ENCOUNTER — Telehealth: Payer: Self-pay | Admitting: Interventional Cardiology

## 2019-02-12 NOTE — Telephone Encounter (Signed)

## 2019-02-12 NOTE — Telephone Encounter (Signed)
New Message          Patient is calling in today to see if she should be concerned about bruising. Patient states she has not hit or hurt herself and yet still she is bruising.

## 2019-02-12 NOTE — Telephone Encounter (Signed)
Returned call to the pt and spoke with pt and she states she has bruising on her arm and knee. She states she has been unsteady. Advised pt I could move her appt to this afternoon at 3pm, pt states she won't be able to come in this week. Advised to keep an eye on the bruising and if it gets worse to call her PCP office to let them assess. Instructed pt that she has an appt on Monday and we will be checking her INR and she states she was unaware but will be able to come. She lives in a [place] and the transportation car will be her. Instructed her that we will check her INR thru the drive up service and call her later with dosing details. Pt states she will keep an eye on the bruising and call back if needed.

## 2019-02-16 ENCOUNTER — Other Ambulatory Visit: Payer: Self-pay

## 2019-02-16 ENCOUNTER — Ambulatory Visit (INDEPENDENT_AMBULATORY_CARE_PROVIDER_SITE_OTHER): Payer: Medicare HMO | Admitting: *Deleted

## 2019-02-16 DIAGNOSIS — Z5181 Encounter for therapeutic drug level monitoring: Secondary | ICD-10-CM | POA: Diagnosis not present

## 2019-02-16 DIAGNOSIS — M6281 Muscle weakness (generalized): Secondary | ICD-10-CM | POA: Diagnosis not present

## 2019-02-16 DIAGNOSIS — M25561 Pain in right knee: Secondary | ICD-10-CM | POA: Diagnosis not present

## 2019-02-16 DIAGNOSIS — M25562 Pain in left knee: Secondary | ICD-10-CM | POA: Diagnosis not present

## 2019-02-16 DIAGNOSIS — Z952 Presence of prosthetic heart valve: Secondary | ICD-10-CM | POA: Diagnosis not present

## 2019-02-16 DIAGNOSIS — Z7901 Long term (current) use of anticoagulants: Secondary | ICD-10-CM

## 2019-02-16 DIAGNOSIS — R2681 Unsteadiness on feet: Secondary | ICD-10-CM | POA: Diagnosis not present

## 2019-02-16 LAB — POCT INR: INR: 4 — AB (ref 2.0–3.0)

## 2019-02-16 NOTE — Patient Instructions (Signed)
Description   Spoke with pt and instructed pt to hold tomorrow's dose then start taking the dose you should be taking which is 1 tablet daily except 1/2 tablet on Mondays and Fridays. Recheck in 2 weeks. Coumadin Clinic [phone] Main [phone].

## 2019-02-26 ENCOUNTER — Telehealth: Payer: Self-pay | Admitting: Family Medicine

## 2019-02-26 DIAGNOSIS — M25562 Pain in left knee: Secondary | ICD-10-CM | POA: Diagnosis not present

## 2019-02-26 DIAGNOSIS — R2681 Unsteadiness on feet: Secondary | ICD-10-CM | POA: Diagnosis not present

## 2019-02-26 DIAGNOSIS — M6281 Muscle weakness (generalized): Secondary | ICD-10-CM | POA: Diagnosis not present

## 2019-02-26 DIAGNOSIS — M25561 Pain in right knee: Secondary | ICD-10-CM | POA: Diagnosis not present

## 2019-02-26 NOTE — Telephone Encounter (Signed)
Copied from [place] [id number]. Topic: Quick Communication - See Telephone Encounter >> Feb 26, 2019  1:04 PM Blase Mess A wrote: CRM for notification. See Telephone encounter for: 02/26/19.  Patient is calling she had arthritis very bad. She is reading about oil CBD. She wants to know if she is allowed to try those. Please advise. CB- [phone] (H) Ok to leave a VM

## 2019-02-27 ENCOUNTER — Telehealth: Payer: Self-pay | Admitting: Interventional Cardiology

## 2019-02-27 ENCOUNTER — Telehealth: Payer: Self-pay | Admitting: *Deleted

## 2019-02-27 ENCOUNTER — Telehealth: Payer: Self-pay

## 2019-02-27 NOTE — Telephone Encounter (Signed)
Called and spoke with pt and she is aware of Dr. Barbie Banner recs that ok to try the cbd oil.

## 2019-02-27 NOTE — Telephone Encounter (Signed)
I spoke to the patient who is calling because of elevated BP.  She took her morning dose of Lisinopril 10 mg at 9:00am and at 11:00am, her BP was 165/87 HR 72.  She feels a little dizzy and not herself, but without headache or blurred vision.     She took her BP while on the phone at 12:00pm and it was 134/91 HR 68.  I told her to monitor BP,HR (taking 2 hours after medication and at bed time) and symptoms over the weekend and update [place] on Monday.

## 2019-02-27 NOTE — Telephone Encounter (Signed)
lmom for prescreen  

## 2019-02-27 NOTE — Telephone Encounter (Signed)
Yes she could try that. It is perfectly safe.

## 2019-02-27 NOTE — Telephone Encounter (Signed)
Dr. Fry please advise. Thanks  

## 2019-02-27 NOTE — Telephone Encounter (Signed)
1. Do you currently have a fever? no (yes = cancel and refer to pcp for e-visit) 2. Have you recently travelled on a cruise, internationally, or to [place], [place], [place], [place], [place], or [place], [place] [employer]) ? NO (yes = cancel, stay home, monitor symptoms, and contact pcp or initiate e-visit if symptoms develop) 3. Have you been in contact with someone that is currently pending confirmation of Covid19 testing or has been confirmed to have the [place] virus?  NO (yes = cancel, stay home, away from tested individual, monitor symptoms, and contact pcp or initiate e-visit if symptoms develop) 4. Are you currently experiencing fatigue or cough? NO (yes = pt should be prepared to have a mask placed at the time of their visit).  Pt. Advised that we are restricting visitors at this time and anyone present in the vehicle should meet the above criteria as well. Advised that visit will be at curbside for finger stick ONLY and will receive call with instructions. Pt also advised to please bring own pen for signature of arrival document.

## 2019-02-27 NOTE — Telephone Encounter (Signed)
Pt c/o BP issue: STAT if pt c/o blurred vision, one-sided weakness or slurred speech  1. What are your last 5 BP readings? 165/87 pulse 72  2. Are you having any other symptoms (ex. Dizziness, headache, blurred vision, passed out)? Yes   3. What is your BP issue? BP going up

## 2019-03-02 ENCOUNTER — Ambulatory Visit (INDEPENDENT_AMBULATORY_CARE_PROVIDER_SITE_OTHER): Payer: Medicare HMO | Admitting: *Deleted

## 2019-03-02 ENCOUNTER — Other Ambulatory Visit: Payer: Self-pay

## 2019-03-02 DIAGNOSIS — M6281 Muscle weakness (generalized): Secondary | ICD-10-CM | POA: Diagnosis not present

## 2019-03-02 DIAGNOSIS — Z952 Presence of prosthetic heart valve: Secondary | ICD-10-CM

## 2019-03-02 DIAGNOSIS — R2681 Unsteadiness on feet: Secondary | ICD-10-CM | POA: Diagnosis not present

## 2019-03-02 DIAGNOSIS — Z5181 Encounter for therapeutic drug level monitoring: Secondary | ICD-10-CM | POA: Diagnosis not present

## 2019-03-02 DIAGNOSIS — M25562 Pain in left knee: Secondary | ICD-10-CM | POA: Diagnosis not present

## 2019-03-02 DIAGNOSIS — Z7901 Long term (current) use of anticoagulants: Secondary | ICD-10-CM | POA: Diagnosis not present

## 2019-03-02 DIAGNOSIS — M25561 Pain in right knee: Secondary | ICD-10-CM | POA: Diagnosis not present

## 2019-03-02 LAB — POCT INR: INR: 2.3 (ref 2.0–3.0)

## 2019-03-02 NOTE — Patient Instructions (Addendum)
Description   Spoke with dtr-Rebecca and instructed pt to take 1 tablet today then continue taking 1 tablet daily except 1/2 tablet on Mondays and Fridays. Recheck in 2 weeks. Coumadin Clinic [phone] Main [phone].

## 2019-03-06 ENCOUNTER — Telehealth: Payer: Self-pay | Admitting: Neurology

## 2019-03-06 DIAGNOSIS — M25562 Pain in left knee: Secondary | ICD-10-CM | POA: Diagnosis not present

## 2019-03-06 DIAGNOSIS — R2681 Unsteadiness on feet: Secondary | ICD-10-CM | POA: Diagnosis not present

## 2019-03-06 DIAGNOSIS — M6281 Muscle weakness (generalized): Secondary | ICD-10-CM | POA: Diagnosis not present

## 2019-03-06 DIAGNOSIS — M25561 Pain in right knee: Secondary | ICD-10-CM | POA: Diagnosis not present

## 2019-03-06 NOTE — Telephone Encounter (Signed)
Patient was sch for 03-25-19 to see you, she is not able to do a E VISIT per Daughter Virginia Franklin. She wanted to know if she could just do a telephone visit with no video? Please advise   Her Daughter would also like to speak with Dr Delice Lesch about what is going on with patient. Virginia Franklin know is it will be Monday before I can get back with her about appointment

## 2019-03-09 DIAGNOSIS — M25561 Pain in right knee: Secondary | ICD-10-CM | POA: Diagnosis not present

## 2019-03-09 DIAGNOSIS — R2681 Unsteadiness on feet: Secondary | ICD-10-CM | POA: Diagnosis not present

## 2019-03-09 DIAGNOSIS — M25562 Pain in left knee: Secondary | ICD-10-CM | POA: Diagnosis not present

## 2019-03-09 DIAGNOSIS — M6281 Muscle weakness (generalized): Secondary | ICD-10-CM | POA: Diagnosis not present

## 2019-03-09 NOTE — Telephone Encounter (Signed)
Spoke with pt daughter she was talking about her mother being confused, she had a mix up with medications they now have a nurse to help with her medication, Dr. Delice Lesch ok with doing a phone visit with pt.

## 2019-03-09 NOTE — Telephone Encounter (Signed)
Ok to do telephone visit. Heather, pls note down issues daughter wanted to discuss on phone visit. Thanks

## 2019-03-10 ENCOUNTER — Telehealth: Payer: Self-pay | Admitting: Neurology

## 2019-03-10 NOTE — Telephone Encounter (Signed)
Pt daughter called no answer voice mail was left for her to call back.

## 2019-03-10 NOTE — Telephone Encounter (Signed)
Patient daughter [name] would like to talk to someone about her mother. She would like to know if what her mother is dealing with is normal and what she needs to do to help

## 2019-03-11 DIAGNOSIS — M25562 Pain in left knee: Secondary | ICD-10-CM | POA: Diagnosis not present

## 2019-03-11 DIAGNOSIS — M25561 Pain in right knee: Secondary | ICD-10-CM | POA: Diagnosis not present

## 2019-03-11 DIAGNOSIS — M6281 Muscle weakness (generalized): Secondary | ICD-10-CM | POA: Diagnosis not present

## 2019-03-11 DIAGNOSIS — R2681 Unsteadiness on feet: Secondary | ICD-10-CM | POA: Diagnosis not present

## 2019-03-12 NOTE — Telephone Encounter (Signed)
Pt daughter called back, she was asking if pt can have episodes of confusion and then bounce back to normal or if she will have a yoyo affect? Pt went a month not taken medication right like stated before and now she has a nurse giving her medication. Pt daughter stated that her mom is asking questions over again, she said she is happy just having trouble remembering. She is asking about what is or what will be the normal for her mother with dementia?

## 2019-03-13 ENCOUNTER — Telehealth: Payer: Self-pay

## 2019-03-13 NOTE — Telephone Encounter (Signed)
Patient returning call.

## 2019-03-13 NOTE — Telephone Encounter (Signed)
Pt daughter returned call. Packet for family members about dementia put in mail for her to have to read,   Mailed to [address] [place] [address]

## 2019-03-13 NOTE — Telephone Encounter (Signed)
Pt daughter called no answer voice mail left for her to call back  

## 2019-03-13 NOTE — Telephone Encounter (Signed)
lmom for prescreen  

## 2019-03-13 NOTE — Telephone Encounter (Signed)
Pls let daughter know that patients with dementia have good and bad days. Bad days usually occur when they are sleep deprived, more tired, or if there is an infection going on. We have an informational packet for family members about dementia, if daughter would like this to be mailed to her, pls get address and we will send it. Thanks

## 2019-03-13 NOTE — Telephone Encounter (Signed)

## 2019-03-16 ENCOUNTER — Other Ambulatory Visit: Payer: Self-pay

## 2019-03-16 ENCOUNTER — Ambulatory Visit (INDEPENDENT_AMBULATORY_CARE_PROVIDER_SITE_OTHER): Payer: Medicare HMO | Admitting: Pharmacist

## 2019-03-16 DIAGNOSIS — Z5181 Encounter for therapeutic drug level monitoring: Secondary | ICD-10-CM | POA: Diagnosis not present

## 2019-03-16 DIAGNOSIS — Z952 Presence of prosthetic heart valve: Secondary | ICD-10-CM

## 2019-03-16 DIAGNOSIS — Z7901 Long term (current) use of anticoagulants: Secondary | ICD-10-CM

## 2019-03-16 LAB — POCT INR: INR: 3 (ref 2.0–3.0)

## 2019-03-18 ENCOUNTER — Telehealth: Payer: Self-pay | Admitting: Family Medicine

## 2019-03-18 NOTE — Telephone Encounter (Signed)
Copied from [place] [phone]. Topic: Quick Communication - Rx Refill/Question >> Mar 18, 2019 12:19 PM Waylan Rocher, Lumin L wrote:  Day  Day Medication: diclofenac sodium (VOLTAREN) 1 % GEL (90 day supply)  Has the patient contacted their pharmacy? {yes  (Agent: If no, request that the patient contact the pharmacy for the refill.) (Agent: If yes, when and what did the pharmacy advise?) contact practice  [facility] ([facility] phone number [facility]  Agent: Please be advised that RX refills may take up to 3 business days. We ask that you follow-up with your pharmacy.

## 2019-03-18 NOTE — Telephone Encounter (Signed)
Copied from [place] [phone]. Topic: General - Other >> Mar 18, 2019 12:49 PM Keene Breath wrote: Reason for CRM: Patient called to also inform the doctor that 2 days ago, her BP was elevated.  She did not know the exact reading, but it did come down today.  Patient thinks it could be her medication that is causing it to elevate.  Please advise and call patient back to discuss.  VQ#[phone] >> Mar 18, 2019  1:05 PM Burchel, Abbi R wrote: Please call pt on her HOME number.

## 2019-03-20 DIAGNOSIS — M25561 Pain in right knee: Secondary | ICD-10-CM | POA: Diagnosis not present

## 2019-03-20 DIAGNOSIS — M25562 Pain in left knee: Secondary | ICD-10-CM | POA: Diagnosis not present

## 2019-03-20 DIAGNOSIS — R2681 Unsteadiness on feet: Secondary | ICD-10-CM | POA: Diagnosis not present

## 2019-03-20 DIAGNOSIS — M6281 Muscle weakness (generalized): Secondary | ICD-10-CM | POA: Diagnosis not present

## 2019-03-23 MED ORDER — DICLOFENAC SODIUM 1 % TD GEL: 2.0000 g | Freq: Four times a day (QID) | TRANSDERMAL | 5 refills | Status: DC

## 2019-03-23 NOTE — Telephone Encounter (Signed)
Call in 100 gm with 5 rf

## 2019-03-25 ENCOUNTER — Ambulatory Visit: Payer: Medicare HMO | Admitting: Neurology

## 2019-03-25 DIAGNOSIS — R2681 Unsteadiness on feet: Secondary | ICD-10-CM | POA: Diagnosis not present

## 2019-03-25 DIAGNOSIS — M6281 Muscle weakness (generalized): Secondary | ICD-10-CM | POA: Diagnosis not present

## 2019-03-25 DIAGNOSIS — M25561 Pain in right knee: Secondary | ICD-10-CM | POA: Diagnosis not present

## 2019-03-25 DIAGNOSIS — M25562 Pain in left knee: Secondary | ICD-10-CM | POA: Diagnosis not present

## 2019-03-26 ENCOUNTER — Telehealth: Payer: Self-pay | Admitting: Family Medicine

## 2019-03-26 NOTE — Telephone Encounter (Signed)
Copied from [place] [phone]. Topic: Quick Communication - Rx Refill/Question >> Mar 26, 2019 11:24 AM Gustavus Messing wrote: Medication: ezetimibe-simvastatin (VYTORIN) 10-40 MG tablet  furosemide (LASIX) 20 MG tablet  lisinopril (PRINIVIL,ZESTRIL) 10 MG tablet   Has the patient contacted their pharmacy? No. (Agent: If no, request that the patient contact the pharmacy for the refill.) The patient has no refills on these prescriptions. The senior living facility called for these refills   Preferred Pharmacy (with phone number or street name): [place], [place] [phone] (Phone) [phone] (Fax)    Agent: Please be advised that RX refills may take up to 3 business days. We ask that you follow-up with your pharmacy.

## 2019-03-26 NOTE — Telephone Encounter (Signed)
Facility RN, Izora Gala, called to advise pt will be out of Vytorin before mail order arrives. Please send in 5 tabs to [facility] [address] to last until this order arrives.

## 2019-03-27 ENCOUNTER — Ambulatory Visit: Payer: Self-pay | Admitting: Family Medicine

## 2019-03-27 ENCOUNTER — Other Ambulatory Visit: Payer: Self-pay | Admitting: Family Medicine

## 2019-03-27 DIAGNOSIS — R2681 Unsteadiness on feet: Secondary | ICD-10-CM | POA: Diagnosis not present

## 2019-03-27 DIAGNOSIS — M25561 Pain in right knee: Secondary | ICD-10-CM | POA: Diagnosis not present

## 2019-03-27 DIAGNOSIS — M6281 Muscle weakness (generalized): Secondary | ICD-10-CM | POA: Diagnosis not present

## 2019-03-27 DIAGNOSIS — M25562 Pain in left knee: Secondary | ICD-10-CM | POA: Diagnosis not present

## 2019-03-27 MED ORDER — EZETIMIBE-SIMVASTATIN 10-40 MG PO TABS: 1.0000 | ORAL_TABLET | Freq: Every day | ORAL | 0 refills | Status: DC

## 2019-03-27 NOTE — Telephone Encounter (Signed)
Spoke with Tammy in the practice and told to send triage note for review.

## 2019-03-27 NOTE — Telephone Encounter (Signed)
Refill has been sent to the local pharmacy

## 2019-03-27 NOTE — Telephone Encounter (Signed)
Pt. Reports she is having left lower quadrant abdominal pain x 3 days. Last "good BM 3 days ago. I do have problems with constipation.I feel bad, not eating much." Daughter reports pt. Has a history of Diverticulitis. Had a small bowel movement today with stool "small round shapes."  Pt. Has "memory issues and can not do " a virtual visit per daughter.Also lives in assisted living. Please advise. Daughter is Clinical cytogeneticist. Her contact number is [phone]  Answer Assessment - Initial Assessment Questions 1. LOCATION: "Where does it hurt?"      Left lower side 2. RADIATION: "Does the pain shoot anywhere else?" (e.g., chest, back)     No 3. ONSET: "When did the pain begin?" (e.g., minutes, hours or days ago)      2 days ago 4. SUDDEN: "Gradual or sudden onset?"     Sudden 5. PATTERN "Does the pain come and go, or is it constant?"    - If constant: "Is it getting better, staying the same, or worsening?"      (Note: Constant means the pain never goes away completely; most serious pain is constant and it progresses)     - If intermittent: "How long does it last?" "Do you have pain now?"     (Note: Intermittent means the pain goes away completely between bouts)     Comes and goes 6. SEVERITY: "How bad is the pain?"  (e.g., Scale 1-10; mild, moderate, or severe)   - MILD (1-3): doesn't interfere with normal activities, abdomen soft and not tender to touch    - MODERATE (4-7): interferes with normal activities or awakens from sleep, tender to touch    - SEVERE (8-10): excruciating pain, doubled over, unable to do any normal activities      10 7. RECURRENT SYMPTOM: "Have you ever had this type of abdominal pain before?" If so, ask: "When was the last time?" and "What happened that time?"      No 8. CAUSE: "What do you think is causing the abdominal pain?"     Maybe constipation 9. RELIEVING/AGGRAVATING FACTORS: "What makes it better or worse?" (e.g., movement, antacids, bowel movement)     Small movement  helped 10. OTHER SYMPTOMS: "Has there been any vomiting, diarrhea, constipation, or urine problems?"       Constipation 11. PREGNANCY: "Is there any chance you are pregnant?" "When was your last menstrual period?"       No  Protocols used: ABDOMINAL PAIN - [facility]

## 2019-03-27 NOTE — Telephone Encounter (Signed)
I have called and left a message on daughters VM to advise her what Dr. Ginnie Smart are.

## 2019-03-27 NOTE — Telephone Encounter (Signed)
She needs to be examined and possibly have labs and Xrays done. I suggest they have EMS take her to the ER

## 2019-03-27 NOTE — Telephone Encounter (Signed)
Dr. Fry please advise. Thanks  

## 2019-03-27 NOTE — Telephone Encounter (Signed)
Not sure why they didn't call the office with this message.... or if they did it is not documented. I sent to [place].

## 2019-03-29 ENCOUNTER — Other Ambulatory Visit: Payer: Self-pay

## 2019-03-29 ENCOUNTER — Encounter (HOSPITAL_COMMUNITY): Payer: Self-pay | Admitting: Emergency Medicine

## 2019-03-29 ENCOUNTER — Emergency Department (HOSPITAL_COMMUNITY): Payer: Medicare HMO

## 2019-03-29 ENCOUNTER — Inpatient Hospital Stay (HOSPITAL_COMMUNITY)
Admission: EM | Admit: 2019-03-29 | Discharge: 2019-04-01 | DRG: 872 | Disposition: A | Discharge status: Home Health | Payer: Medicare HMO | Attending: Internal Medicine | Admitting: Internal Medicine

## 2019-03-29 DIAGNOSIS — Z79899 Other long term (current) drug therapy: Secondary | ICD-10-CM

## 2019-03-29 DIAGNOSIS — M1990 Unspecified osteoarthritis, unspecified site: Secondary | ICD-10-CM | POA: Diagnosis present

## 2019-03-29 DIAGNOSIS — R791 Abnormal coagulation profile: Secondary | ICD-10-CM

## 2019-03-29 DIAGNOSIS — Z952 Presence of prosthetic heart valve: Secondary | ICD-10-CM

## 2019-03-29 DIAGNOSIS — R1032 Left lower quadrant pain: Secondary | ICD-10-CM

## 2019-03-29 DIAGNOSIS — F418 Other specified anxiety disorders: Secondary | ICD-10-CM | POA: Diagnosis present

## 2019-03-29 DIAGNOSIS — F0280 Dementia in other diseases classified elsewhere without behavioral disturbance: Secondary | ICD-10-CM | POA: Diagnosis present

## 2019-03-29 DIAGNOSIS — Z9849 Cataract extraction status, unspecified eye: Secondary | ICD-10-CM

## 2019-03-29 DIAGNOSIS — Z20828 Contact with and (suspected) exposure to other viral communicable diseases: Secondary | ICD-10-CM | POA: Diagnosis not present

## 2019-03-29 DIAGNOSIS — N2889 Other specified disorders of kidney and ureter: Secondary | ICD-10-CM | POA: Diagnosis not present

## 2019-03-29 DIAGNOSIS — A419 Sepsis, unspecified organism: Secondary | ICD-10-CM | POA: Diagnosis present

## 2019-03-29 DIAGNOSIS — Z87891 Personal history of nicotine dependence: Secondary | ICD-10-CM | POA: Diagnosis not present

## 2019-03-29 DIAGNOSIS — K5720 Diverticulitis of large intestine with perforation and abscess without bleeding: Secondary | ICD-10-CM | POA: Diagnosis present

## 2019-03-29 DIAGNOSIS — Z8261 Family history of arthritis: Secondary | ICD-10-CM

## 2019-03-29 DIAGNOSIS — M810 Age-related osteoporosis without current pathological fracture: Secondary | ICD-10-CM | POA: Diagnosis present

## 2019-03-29 DIAGNOSIS — Z880 Allergy status to penicillin: Secondary | ICD-10-CM

## 2019-03-29 DIAGNOSIS — Z7901 Long term (current) use of anticoagulants: Secondary | ICD-10-CM | POA: Diagnosis not present

## 2019-03-29 DIAGNOSIS — K5792 Diverticulitis of intestine, part unspecified, without perforation or abscess without bleeding: Secondary | ICD-10-CM | POA: Diagnosis not present

## 2019-03-29 DIAGNOSIS — E871 Hypo-osmolality and hyponatremia: Secondary | ICD-10-CM

## 2019-03-29 DIAGNOSIS — R509 Fever, unspecified: Secondary | ICD-10-CM

## 2019-03-29 DIAGNOSIS — D649 Anemia, unspecified: Secondary | ICD-10-CM | POA: Diagnosis not present

## 2019-03-29 DIAGNOSIS — I10 Essential (primary) hypertension: Secondary | ICD-10-CM | POA: Diagnosis not present

## 2019-03-29 DIAGNOSIS — K5730 Diverticulosis of large intestine without perforation or abscess without bleeding: Secondary | ICD-10-CM | POA: Diagnosis not present

## 2019-03-29 DIAGNOSIS — R69 Illness, unspecified: Secondary | ICD-10-CM | POA: Diagnosis not present

## 2019-03-29 DIAGNOSIS — E785 Hyperlipidemia, unspecified: Secondary | ICD-10-CM | POA: Diagnosis present

## 2019-03-29 DIAGNOSIS — R110 Nausea: Secondary | ICD-10-CM

## 2019-03-29 DIAGNOSIS — G300 Alzheimer's disease with early onset: Secondary | ICD-10-CM | POA: Diagnosis not present

## 2019-03-29 DIAGNOSIS — K5900 Constipation, unspecified: Secondary | ICD-10-CM

## 2019-03-29 DIAGNOSIS — Z841 Family history of disorders of kidney and ureter: Secondary | ICD-10-CM

## 2019-03-29 DIAGNOSIS — Z90710 Acquired absence of both cervix and uterus: Secondary | ICD-10-CM

## 2019-03-29 DIAGNOSIS — Z8673 Personal history of transient ischemic attack (TIA), and cerebral infarction without residual deficits: Secondary | ICD-10-CM | POA: Diagnosis not present

## 2019-03-29 DIAGNOSIS — R109 Unspecified abdominal pain: Secondary | ICD-10-CM

## 2019-03-29 DIAGNOSIS — Z87892 Personal history of anaphylaxis: Secondary | ICD-10-CM

## 2019-03-29 DIAGNOSIS — M069 Rheumatoid arthritis, unspecified: Secondary | ICD-10-CM | POA: Diagnosis present

## 2019-03-29 DIAGNOSIS — G309 Alzheimer's disease, unspecified: Secondary | ICD-10-CM | POA: Diagnosis present

## 2019-03-29 DIAGNOSIS — Z8541 Personal history of malignant neoplasm of cervix uteri: Secondary | ICD-10-CM | POA: Diagnosis not present

## 2019-03-29 LAB — URINALYSIS, ROUTINE W REFLEX MICROSCOPIC
Bacteria, UA: NONE SEEN
Bilirubin Urine: NEGATIVE
Glucose, UA: NEGATIVE mg/dL
Ketones, ur: NEGATIVE mg/dL
Leukocytes,Ua: NEGATIVE
Nitrite: NEGATIVE
Protein, ur: NEGATIVE mg/dL
Specific Gravity, Urine: 1.01 (ref 1.005–1.030)
pH: 6 (ref 5.0–8.0)

## 2019-03-29 LAB — COMPREHENSIVE METABOLIC PANEL
ALT: 14 U/L (ref 0–44)
AST: 21 U/L (ref 15–41)
Albumin: 3.6 g/dL (ref 3.5–5.0)
Alkaline Phosphatase: 80 U/L (ref 38–126)
Anion gap: 10 (ref 5–15)
BUN: 12 mg/dL (ref 8–23)
CO2: 25 mmol/L (ref 22–32)
Calcium: 8.9 mg/dL (ref 8.9–10.3)
Chloride: 97 mmol/L — ABNORMAL LOW (ref 98–111)
Creatinine, Ser: 0.82 mg/dL (ref 0.44–1.00)
GFR calc Af Amer: >60 mL/min (ref >60)
GFR calc non Af Amer: >60 mL/min (ref >60)
Glucose, Bld: 120 mg/dL — ABNORMAL HIGH (ref 70–99)
Potassium: 3.7 mmol/L (ref 3.5–5.1)
Sodium: 132 mmol/L — ABNORMAL LOW (ref 135–145)
Total Bilirubin: 0.9 mg/dL (ref 0.3–1.2)
Total Protein: 7.3 g/dL (ref 6.5–8.1)

## 2019-03-29 LAB — CBC
HCT: 40.3 % (ref 36.0–46.0)
Hemoglobin: 13.2 g/dL (ref 12.0–15.0)
MCH: 29.9 pg (ref 26.0–34.0)
MCHC: 32.8 g/dL (ref 30.0–36.0)
MCV: 91.2 fL (ref 80.0–100.0)
Platelets: 256 10*3/uL (ref 150–400)
RBC: 4.42 MIL/uL (ref 3.87–5.11)
RDW: 13 % (ref 11.5–15.5)
WBC: 10.8 10*3/uL — ABNORMAL HIGH (ref 4.0–10.5)
nRBC: 0 % (ref 0.0–0.2)

## 2019-03-29 LAB — SARS CORONAVIRUS 2 BY RT PCR (HOSPITAL ORDER, PERFORMED IN ~~LOC~~ HOSPITAL LAB): SARS Coronavirus 2: NEGATIVE

## 2019-03-29 LAB — LIPASE, BLOOD: Lipase: 20 U/L (ref 11–51)

## 2019-03-29 LAB — LACTIC ACID, PLASMA: Lactic Acid, Venous: 0.8 mmol/L (ref 0.5–1.9)

## 2019-03-29 LAB — PROTIME-INR
INR: 4 — ABNORMAL HIGH (ref 0.8–1.2)
Prothrombin Time: 38.1 seconds — ABNORMAL HIGH (ref 11.4–15.2)

## 2019-03-29 MED ORDER — ACETAMINOPHEN 325 MG PO TABS: 650.0000 mg | ORAL_TABLET | Freq: Three times a day (TID) | ORAL | Status: DC | PRN

## 2019-03-29 MED ORDER — B COMPLEX PO TABS: 2.0000 | ORAL_TABLET | Freq: Every day | ORAL | Status: DC

## 2019-03-29 MED ORDER — METRONIDAZOLE IN NACL 5-0.79 MG/ML-% IV SOLN
500.0000 mg | Freq: Once | INTRAVENOUS | Status: AC
  Administered 2019-03-29: 500 mg via INTRAVENOUS
  Filled 2019-03-29: qty 100

## 2019-03-29 MED ORDER — SENNOSIDES-DOCUSATE SODIUM 8.6-50 MG PO TABS: 1.0000 | ORAL_TABLET | Freq: Every evening | ORAL | Status: DC | PRN

## 2019-03-29 MED ORDER — LISINOPRIL 10 MG PO TABS
10.0000 mg | ORAL_TABLET | Freq: Every day | ORAL | Status: DC
  Administered 2019-03-30 – 2019-04-01 (×3): 10 mg via ORAL
  Filled 2019-03-29 (×3): qty 1

## 2019-03-29 MED ORDER — ESCITALOPRAM OXALATE 10 MG PO TABS
5.0000 mg | ORAL_TABLET | Freq: Every day | ORAL | Status: DC
  Administered 2019-03-30 – 2019-04-01 (×3): 5 mg via ORAL
  Filled 2019-03-29 (×3): qty 1

## 2019-03-29 MED ORDER — MORPHINE SULFATE (PF) 2 MG/ML IV SOLN: 2.0000 mg | Freq: Once | INTRAVENOUS | Status: DC

## 2019-03-29 MED ORDER — POLYETHYLENE GLYCOL 3350 17 G PO PACK
17.0000 g | PACK | Freq: Every day | ORAL | Status: DC
  Administered 2019-03-30 – 2019-03-31 (×2): 17 g via ORAL
  Filled 2019-03-29 (×2): qty 1

## 2019-03-29 MED ORDER — EZETIMIBE-SIMVASTATIN 10-40 MG PO TABS
1.0000 | ORAL_TABLET | Freq: Every day | ORAL | Status: DC
  Administered 2019-03-30 – 2019-04-01 (×3): 1 via ORAL
  Filled 2019-03-29 (×3): qty 1

## 2019-03-29 MED ORDER — CIPROFLOXACIN IN D5W 400 MG/200ML IV SOLN
400.0000 mg | Freq: Once | INTRAVENOUS | Status: AC
  Administered 2019-03-29: 400 mg via INTRAVENOUS
  Filled 2019-03-29: qty 200

## 2019-03-29 MED ORDER — SODIUM CHLORIDE 0.9 % IV SOLN
Freq: Once | INTRAVENOUS | Status: AC
  Administered 2019-03-30: 10:00:00 via INTRAVENOUS

## 2019-03-29 MED ORDER — FUROSEMIDE 20 MG PO TABS
20.0000 mg | ORAL_TABLET | Freq: Every day | ORAL | Status: DC
  Administered 2019-03-30 – 2019-04-01 (×3): 20 mg via ORAL
  Filled 2019-03-29 (×3): qty 1

## 2019-03-29 MED ORDER — MEMANTINE HCL 10 MG PO TABS
10.0000 mg | ORAL_TABLET | Freq: Two times a day (BID) | ORAL | Status: DC
  Administered 2019-03-29 – 2019-04-01 (×6): 10 mg via ORAL
  Filled 2019-03-29 (×6): qty 1

## 2019-03-29 MED ORDER — SODIUM CHLORIDE 0.9% FLUSH: 3.0000 mL | Freq: Once | INTRAVENOUS | Status: DC

## 2019-03-29 MED ORDER — CIPROFLOXACIN IN D5W 400 MG/200ML IV SOLN
400.0000 mg | Freq: Two times a day (BID) | INTRAVENOUS | Status: DC
  Administered 2019-03-30: 400 mg via INTRAVENOUS
  Filled 2019-03-29: qty 200

## 2019-03-29 MED ORDER — METRONIDAZOLE IN NACL 5-0.79 MG/ML-% IV SOLN
500.0000 mg | Freq: Three times a day (TID) | INTRAVENOUS | Status: DC
  Administered 2019-03-30: 500 mg via INTRAVENOUS
  Filled 2019-03-29: qty 100

## 2019-03-29 MED ORDER — SODIUM CHLORIDE 0.9 % IV BOLUS
500.0000 mL | Freq: Once | INTRAVENOUS | Status: AC
  Administered 2019-03-29: 500 mL via INTRAVENOUS

## 2019-03-29 MED ORDER — IOHEXOL 300 MG/ML  SOLN
100.0000 mL | Freq: Once | INTRAMUSCULAR | Status: AC | PRN
  Administered 2019-03-29: 100 mL via INTRAVENOUS

## 2019-03-29 MED ORDER — ONDANSETRON HCL 4 MG/2ML IJ SOLN: 4.0000 mg | Freq: Once | INTRAMUSCULAR | Status: DC

## 2019-03-29 NOTE — ED Notes (Signed)
Called pt dtr [phone] to provide update, left vm with my number.

## 2019-03-29 NOTE — ED Notes (Signed)
Patient transported to CT 

## 2019-03-29 NOTE — ED Notes (Signed)
Assisted pt in calling to update dtr.

## 2019-03-29 NOTE — ED Triage Notes (Signed)
C/o intermittent LLQ pain x 1 week.  Pain worse today.  Also reports constipation with history of same.  Pt denies nausea, vomiting, and vomiting.  Fever 100.5 at present.  Pt was unaware of temp.

## 2019-03-29 NOTE — H&P (Addendum)
History and Physical    Virginia Franklin NOB:[id number] DOB: 07/21/1929 DOA: 03/29/2019  Referring PA: Suella Broad PCP: [name], MD  Outpatient Specialists: Daneen Schick Patient coming from: Home  Chief Complaint: Abdominal pain  HPI: Virginia Franklin is a 85 y.o. female with medical history significant of hypertension, hyperlipidemia, Alzheimer's disease, depression and aortic valve replacement with mechanical valve who presents to the emergency department due to left lower quadrant abdominal pain and constipation that has been ongoing for about 3 days, pain was sharp, constant and rated as 10/10 on pain scale, movement aggravates the pain, while there was no alleviating factor.  This was associated with nausea but no vomiting.  Though she has a history of constipation which is usually relieved with prune juice and laxatives at all, but current constipation was not relieved with above measures.  She denies headache, shortness of breath, fever, blurry vision, numbness or tingling.  She denies any sick contact as well.  ED Course:  In the emergency department, febrile with a temperature of 100.16F, BP 159/61, other vital signs are within normal range.  Work-up in the ED showed normal CBC except for WBC 10.8, BMP was normal except for mild hyponatremia.  Urinalysis was negative, lipase 20, lactic acid 0.8.  INR was elevated at 4.0.SARS coronavirus 2 was negative.  CT abdomen and pelvis with contrast showed diverticulitis involving the mid sigmoid colon with small intramural abscesses involving the segment of the colon.  No evidence of perforation or abscess outside of colonic wall.  Surgery was consulted per ED PA and recommended medical management at this time, she was started noted on IV ciprofloxacin and metronidazole.  Hospitalist was contacted for further evaluation and management  Review of Systems:  Constitutional: Negative for chills and fever.  HENT: Negative for ear pain and  sore throat.   Eyes: Negative for pain and visual disturbance.  Respiratory: Negative for cough, chest tightness and shortness of breath.   Cardiovascular: Negative for chest pain and palpitations.  Gastrointestinal: Abdominal pain and nausea but no vomiting.  Endocrine: Negative for polyphagia and polyuria.  Genitourinary: Negative for decreased urine volume, dysuria, enuresis. Skin: Negative for color change and rash.  Allergic/Immunologic: Negative for immunocompromised state.  Neurological: Negative for tremors, syncope, speech difficulty, weakness, light-headedness and headaches.  Hematological: Does not bruise/bleed easily.    Past Medical History:  Diagnosis Date   Body mass index (BMI) of 22.0-22.9 in adult    Cervical cancer [facility])    H/O cervical fracture    H/O osteoporosis    H/O rheumatoid arthritis    H/O: CVA (cerebrovascular accident) 2012   H/O: osteoarthritis    History of urinary frequency    Hx of diverticulitis of colon    Hypertension     Past Surgical History:  Procedure Laterality Date   ABDOMINAL HYSTERECTOMY     CARDIAC SURGERY     CATARACT EXTRACTION     SMALL INTESTINE SURGERY       reports that she has quit smoking. She has never used smokeless tobacco. She reports current alcohol use. She reports that she does not use drugs.  Allergies  Allergen Reactions   Penicillins Anaphylaxis and Swelling    Has patient had a PCN reaction causing immediate rash, facial/tongue/throat swelling, SOB or lightheadedness with hypotension: Yes Has patient had a PCN reaction causing severe rash involving mucus membranes or skin necrosis: No Has patient had a PCN reaction that required hospitalization: No Has patient had a  PCN reaction occurring within the last 10 years: No If all of the above answers are "NO", then may proceed with Cephalosporin use.    Family History  Problem Relation Age of Onset   Kidney disease Mother        MALIGNANT  NEOPLASM   Arthritis Father    Heart Problems Father    Lung disease Brother    Heart disease Neg Hx    Unacceptable: Noncontributory, unremarkable, or negative. Acceptable: Family history reviewed and not pertinent (If you reviewed it)  Prior to Admission medications   Medication Sig Start Date End Date Taking? Authorizing Provider  acetaminophen (TYLENOL) 650 MG CR tablet Take 650 mg by mouth every 8 (eight) hours as needed for pain (or headaches).    Yes [provider]  b complex vitamins tablet Take 2 tablets by mouth daily.   Yes [provider]  Calcium Citrate-Vitamin D (CALCIUM + D PO) Take 2 tablets by mouth daily with breakfast.    Yes [provider]  escitalopram (LEXAPRO) 5 MG tablet Take 1 tablet (5 mg total) by mouth daily. 01/07/19  Yes [name], MD  ezetimibe-simvastatin (VYTORIN) 10-40 MG tablet Take 1 tablet by mouth daily. 03/27/19  Yes [name], MD  furosemide (LASIX) 20 MG tablet Take 1 tablet (20 mg total) by mouth daily. 05/21/18  Yes [name], MD  lisinopril (PRINIVIL,ZESTRIL) 10 MG tablet Take 1 tablet (10 mg total) by mouth daily. 06/30/18  Yes [name], MD  memantine (NAMENDA) 10 MG tablet Take 1 tablet (10 mg total) by mouth 2 (two) times daily. Take 1 tablet twice a day Patient taking differently: Take 10 mg by mouth 2 (two) times daily.  08/12/18  Yes Cameron Sprang, MD  Omega-3 Fatty Acids (FISH OIL) 1000 MG CAPS Take 2,000 mg by mouth every morning.   Yes [provider]  TURMERIC PO Take 3 capsules by mouth every morning. Turmeric with BioPerine   Yes [provider]  warfarin (COUMADIN) 7.5 MG tablet Take 1/2 to 1 tablet daily as directed by Coumadin clinic Patient taking differently: Take 3.75-7.5 mg by mouth See admin instructions. Take 7.5 mg by mouth in the morning on Sun/Tues/Wed/Thurs/Sat and 3.75 mg on Mon/Fri 12/29/18  Yes Belva Crome, MD  diclofenac sodium (VOLTAREN) 1 % GEL  Apply 2 g topically 4 (four) times daily. 03/23/19   [name], MD    Physical Exam: Vitals:   03/29/19 1800 03/29/19 1930 03/29/19 2110 03/29/19 2200  BP: 134/64 138/61 (!) 121/97 137/76  Pulse: 72 69 92 69  Resp: 17 16 20 18   Temp:      TempSrc:      SpO2: 97% 95% 100% 93%      Constitutional: NAD, calm, comfortable Vitals:   03/29/19 1800 03/29/19 1930 03/29/19 2110 03/29/19 2200  BP: 134/64 138/61 (!) 121/97 137/76  Pulse: 72 69 92 69  Resp: 17 16 20 18   Temp:      TempSrc:      SpO2: 97% 95% 100% 93%   Eyes: PERRL, lids and conjunctivae normal ENMT: Mucous membranes are moist. Posterior pharynx clear of any exudate or lesions.Normal dentition.  Neck: normal, supple, no masses, no thyromegaly Respiratory: clear to auscultation bilaterally, no wheezing, no crackles. Normal respiratory effort. No accessory muscle use.  Cardiovascular: Regular rate and rhythm, no murmurs / rubs / gallops. No extremity edema. 2+ pedal pulses. No carotid bruits.  Abdomen: Tender to  palpation of LLQ, No hepatosplenomegaly. Bowel sounds positive.  Musculoskeletal: no clubbing / cyanosis. No joint deformity upper and lower extremities. Good ROM, no contractures. Normal muscle tone.  Skin: no rashes, lesions, ulcers. No induration Neurologic: CN 2-12 grossly intact. Sensation intact, DTR normal. Strength 5/5 in all 4.  Psychiatric: Normal judgment and insight. Alert and oriented x 3. Normal mood.   Labs on Admission: I have personally reviewed following labs and imaging studies  CBC: Recent Labs  Lab 03/29/19 1629  WBC 10.8*  HGB 13.2  HCT 40.3  MCV 91.2  PLT 941   Basic Metabolic Panel: Recent Labs  Lab 03/29/19 1629  NA 132*  K 3.7  CL 97*  CO2 25  GLUCOSE 120*  BUN 12  CREATININE 0.82  CALCIUM 8.9   GFR: CrCl cannot be calculated (Unknown ideal weight.). Liver Function Tests: Recent Labs  Lab 03/29/19 1629  AST 21  ALT 14  ALKPHOS 80  BILITOT 0.9  PROT 7.3    ALBUMIN 3.6   Recent Labs  Lab 03/29/19 1629  LIPASE 20   No results for input(s): AMMONIA in the last 168 hours. Coagulation Profile: Recent Labs  Lab 03/29/19 2107  INR 4.0*   Cardiac Enzymes: No results for input(s): CKTOTAL, CKMB, CKMBINDEX, TROPONINI in the last 168 hours. BNP (last 3 results) No results for input(s): PROBNP in the last 8760 hours. HbA1C: No results for input(s): HGBA1C in the last 72 hours. CBG: No results for input(s): GLUCAP in the last 168 hours. Lipid Profile: No results for input(s): CHOL, HDL, LDLCALC, TRIG, CHOLHDL, LDLDIRECT in the last 72 hours. Thyroid Function Tests: No results for input(s): TSH, T4TOTAL, FREET4, T3FREE, THYROIDAB in the last 72 hours. Anemia Panel: No results for input(s): VITAMINB12, FOLATE, FERRITIN, TIBC, IRON, RETICCTPCT in the last 72 hours. Urine analysis:    Component Value Date/Time   COLORURINE YELLOW 03/29/2019 [place] 03/29/2019 1555   LABSPEC 1.010 03/29/2019 1555   PHURINE 6.0 03/29/2019 1555   GLUCOSEU NEGATIVE 03/29/2019 1555   HGBUR MODERATE (A) 03/29/2019 [place] 03/29/2019 1555   BILIRUBINUR Negative 06/17/2018 1756   [place] 03/29/2019 [place] 03/29/2019 1555   UROBILINOGEN 0.2 06/17/2018 1756   NITRITE NEGATIVE 03/29/2019 [place] 03/29/2019 1555   Sepsis Labs: @LABRCNTIP (procalcitonin:4,lacticidven:4) ) Recent Results (from the past 240 hour(s))  SARS Coronavirus 2 (CEPHEID - Performed in [place] hospital lab), Hosp Order     Status: None   Collection Time: 03/29/19  5:15 PM  Result Value Ref Range Status   SARS Coronavirus 2 NEGATIVE NEGATIVE Final    Comment: (NOTE) If result is NEGATIVE SARS-CoV-2 target nucleic acids are NOT DETECTED. The SARS-CoV-2 RNA is generally detectable in upper and lower  respiratory specimens during the acute phase of infection. The lowest  concentration of  SARS-CoV-2 viral copies this assay can detect is 250  copies / mL. A negative result does not preclude SARS-CoV-2 infection  and should not be used as the sole basis for treatment or other  patient management decisions.  A negative result may occur with  improper specimen collection / handling, submission of specimen other  than nasopharyngeal swab, presence of viral mutation(s) within the  areas targeted by this assay, and inadequate number of viral copies  (<250 copies / mL). A negative result must be combined with clinical  observations, patient history, and epidemiological information. If result is POSITIVE SARS-CoV-2 target nucleic  acids are DETECTED. The SARS-CoV-2 RNA is generally detectable in upper and lower  respiratory specimens dur ing the acute phase of infection.  Positive  results are indicative of active infection with SARS-CoV-2.  Clinical  correlation with patient history and other diagnostic information is  necessary to determine patient infection status.  Positive results do  not rule out bacterial infection or co-infection with other viruses. If result is PRESUMPTIVE POSTIVE SARS-CoV-2 nucleic acids MAY BE PRESENT.   A presumptive positive result was obtained on the submitted specimen  and confirmed on repeat testing.  While 2019 novel coronavirus  (SARS-CoV-2) nucleic acids may be present in the submitted sample  additional confirmatory testing may be necessary for epidemiological  and / or clinical management purposes  to differentiate between  SARS-CoV-2 and other Sarbecovirus currently known to infect humans.  If clinically indicated additional testing with an alternate test  methodology [phone]) is advised. The SARS-CoV-2 RNA is generally  detectable in upper and lower respiratory sp ecimens during the acute  phase of infection. The expected result is Negative. Fact Sheet for Patients:  StrictlyIdeas.no Fact Sheet for Healthcare  Providers: BankingDealers.co.za This test is not yet approved or cleared by the [place] FDA and has been authorized for detection and/or diagnosis of SARS-CoV-2 by FDA under an Emergency Use Authorization (EUA).  This EUA will remain in effect (meaning this test can be used) for the duration of the COVID-19 declaration under Section 564(b)(1) of the Act, 21 U.S.C. section 360bbb-3(b)(1), unless the authorization is terminated or revoked sooner. Performed at [facility], [place] [address], [place], [place] [address]      Radiological Exams on Admission: Ct Abdomen Pelvis W Contrast  Result Date: 03/29/2019 CLINICAL DATA:  One-week history of intermittent LEFT LOWER QUADRANT abdominal pain which acutely worsened today. Fever. Surgical history includes hysterectomy and unspecified small bowel surgery. EXAM: CT ABDOMEN AND PELVIS WITH CONTRAST TECHNIQUE: Multidetector CT imaging of the abdomen and pelvis was performed using the standard protocol following bolus administration of intravenous contrast. CONTRAST:  114mL OMNIPAQUE IOHEXOL 300 MG/ML IV. COMPARISON:  None. FINDINGS: Lower chest: Minimal linear atelectasis or scarring in the lower lobes. Visualized lung bases otherwise clear. Normal heart size. Hepatobiliary: Liver normal in size and appearance. Prominent Riedel's lobe. Gallbladder normal in appearance without calcified gallstones. No biliary ductal dilation. Pancreas: Normal in appearance without evidence of mass, ductal dilation, or inflammation. Spleen: Normal in size and appearance. Accessory splenule MEDIAL to the LOWER pole. Adrenals/Urinary Tract: Normal appearing adrenal glands. Indeterminate 1 cm mass arising from the LOWER pole of the RIGHT kidney with Hounsfield measurements in the 60-70 range. Benign cortical cysts elsewhere in both kidneys. No hydronephrosis. No urinary tract calculi. Urinary bladder decompressed and unremarkable. Stomach/Bowel: Very  small hiatal hernia. Stomach decompressed and otherwise unremarkable. Normal-appearing small bowel. Diffuse colonic diverticulosis, extensive in the sigmoid region, with acute inflammation involving the mid sigmoid colon. Small intramural abscesses involving this segment of the sigmoid colon. No extraluminal gas or abscess outside the colonic wall. Appendix not identified, but no evidence of pericecal inflammation. Vascular/Lymphatic: Moderate aorto-iliofemoral atherosclerosis without evidence of aneurysm. Visceral arteries atherosclerotic though patent. Normal-appearing portal venous and systemic venous systems. No pathologic lymphadenopathy. Reproductive: Surgically absent uterus. No adnexal masses. Other: Multiple phleboliths low in both sides of the pelvis. Very small umbilical hernia containing fat. Musculoskeletal: Slight thoracolumbar levoscoliosis. Degenerative disc disease involving the visualized thoracic spine, T12-L1, L1-2 and L2-3. Facet degenerative changes throughout the lumbar spine, worst  at L4-5 and L5-S1. No acute findings. IMPRESSION: 1. Acute diverticulitis involving the mid sigmoid colon. Small intramural abscesses involving this segment of the colon. No evidence of perforation or abscess outside the colonic wall. 2. Indeterminate 1 cm mass arising from the lower pole of the right kidney. Non-emergent MRI of the abdomen without and with contrast is recommended in further evaluation to confirm or deny this finding. This recommendation follows ACR consensus guidelines: Management of the Incidental Renal Mass on CT: A White Paper of the ACR Incidental Findings Committee. J Am Coll Radiol [phone]. 3. Very small hiatal hernia. Aortic Atherosclerosis (ICD10-170.0) Electronically Signed   By: Evangeline Dakin M.D.   On: 03/29/2019 20:41    EKG: Not performed  Assessment/Plan Principal Problem:   Diverticulitis Active Problems:   Chronic anticoagulation   Aortic valve replaced   HTN  (hypertension)   Dyslipidemia   Depression with anxiety   Alzheimer's dementia (HCC)   Abdominal pain   Supratherapeutic INR   Kidney mass   Hyponatremia   Nausea   Constipation   Acute diverticulitis Abdominal pain/nausea secondary to above Patient presents with 3-day onset of left lower quadrant abdominal pain associated with nausea.  In the emergency department she was noted to be febrile, CT abdomen and pelvis with contrast showed diverticulitis involving the mid sigmoid colon with small intramural abscesses involving the segment of the colon.  She was started on IV ciprofloxacin and metronidazole, we shall continue with same at this time. Continue IV NS Continue IV Zofran prn Continue IV morphine 1 mg every 4 hours PRN for moderate/severe pain Continue clear liquid diet with plan to advance diet as tolerated.  Constipation Continue MiraLAX, docusate  Supratherapeutic INR secondary to chronic anticoagulation Aortic valve replacement INR at 4.0, warfarin will be held at this time Continue to monitor warfarin  Hyponatremia Na 132; continue IV hydration  Hypertension Continue home Lasix/lisinopril  Dyslipidemia Continue Vytorin  Dementia Continue Namenda  Depression with anxiety Continue home Lexapro   DVT prophylaxis: SCDs; warfarin temporarily held due to supratherapeutic INR  Code Status: Full  Family Communication: None at bedside  Disposition Plan: Plan to discharge patient home within the next 48-72 hours Consults called: None Admission status: Inpatient    Bernadette Hoit MD [facility] If 7PM-7AM, please contact night-coverage  03/29/2019, 10:41 PM

## 2019-03-29 NOTE — ED Provider Notes (Signed)
[facility] EMERGENCY DEPARTMENT Provider Note   CSN: [id number] Arrival date & time: 03/29/19  1543    History   Chief Complaint Chief Complaint  Patient presents with   Abdominal Pain   Constipation    HPI Virginia Franklin is a 85 y.o. female.     85 year old female with history of diverticulitis presents with complaint of left lower quadrant abdominal pain and constipation.  Patient states that her pain started about 3 days ago, located left lower quadrant, does not radiate, worse with palpation, nothing improves her pain, pain is sharp in nature and constant.  Patient reports nausea, denies vomiting.  Patient has tried drinking prune juice and taking laxatives at home without relief of her constipation.  Denies changes in bladder habits.  Patient denies any known fevers or chills, was found to be febrile with a temp of 100.5 in triage.  No other complaints or concerns.  Prior abdominal surgeries include complete hysterectomy.  Patient's chart states a small intestine surgery.     Past Medical History:  Diagnosis Date   Body mass index (BMI) of 22.0-22.9 in adult    Cervical cancer (HCC)    H/O cervical fracture    H/O osteoporosis    H/O rheumatoid arthritis    H/O: CVA (cerebrovascular accident) 2012   H/O: osteoarthritis    History of urinary frequency    Hx of diverticulitis of colon    Hypertension     Patient Active Problem List   Diagnosis Date Noted   Alzheimer's dementia ([place]) 01/05/2019   Depression with anxiety 07/25/2018   Altered mental status 12/16/2017   Spells of speech arrest    HTN (hypertension) 10/15/2017   Dyslipidemia 10/15/2017   Hx of completed stroke 10/15/2017   Osteoarthritis 10/15/2017   Encounter for therapeutic drug monitoring 09/13/2017   Chronic anticoagulation 09/05/2017   Aortic valve replaced 09/05/2017    Past Surgical History:  Procedure Laterality Date   ABDOMINAL  HYSTERECTOMY     CARDIAC SURGERY     CATARACT EXTRACTION     SMALL INTESTINE SURGERY       OB History   No obstetric history on file.      Home Medications    Prior to Admission medications   Medication Sig Start Date End Date Taking? Authorizing Provider  acetaminophen (TYLENOL) 650 MG CR tablet Take 650 mg by mouth every 8 (eight) hours as needed for pain (or headaches).    Yes [provider]  b complex vitamins tablet Take 2 tablets by mouth daily.   Yes [provider]  Calcium Citrate-Vitamin D (CALCIUM + D PO) Take 2 tablets by mouth daily with breakfast.    Yes [provider]  escitalopram (LEXAPRO) 5 MG tablet Take 1 tablet (5 mg total) by mouth daily. 01/07/19  Yes [name], MD  ezetimibe-simvastatin (VYTORIN) 10-40 MG tablet Take 1 tablet by mouth daily. 03/27/19  Yes [name], MD  furosemide (LASIX) 20 MG tablet Take 1 tablet (20 mg total) by mouth daily. 05/21/18  Yes [name], MD  lisinopril (PRINIVIL,ZESTRIL) 10 MG tablet Take 1 tablet (10 mg total) by mouth daily. 06/30/18  Yes [name], MD  memantine (NAMENDA) 10 MG tablet Take 1 tablet (10 mg total) by mouth 2 (two) times daily. Take 1 tablet twice a day Patient taking differently: Take 10 mg by mouth 2 (two) times daily.  08/12/18  Yes Cameron Sprang, MD  Omega-3  Fatty Acids (FISH OIL) 1000 MG CAPS Take 2,000 mg by mouth every morning.   Yes [provider]  TURMERIC PO Take 3 capsules by mouth every morning. Turmeric with BioPerine   Yes [provider]  warfarin (COUMADIN) 7.5 MG tablet Take 1/2 to 1 tablet daily as directed by Coumadin clinic Patient taking differently: Take 3.75-7.5 mg by mouth See admin instructions. Take 7.5 mg by mouth in the morning on Sun/Tues/Wed/Thurs/Sat and 3.75 mg on Mon/Fri 12/29/18  Yes Belva Crome, MD  diclofenac sodium (VOLTAREN) 1 % GEL Apply 2 g topically 4 (four) times daily. 03/23/19   [name], MD     Family History Family History  Problem Relation Age of Onset   Kidney disease Mother        MALIGNANT NEOPLASM   Arthritis Father    Heart Problems Father    Lung disease Brother    Heart disease Neg Hx     Social History Social History   Tobacco Use   Smoking status: Former Smoker   Smokeless tobacco: Never Used  Substance Use Topics   Alcohol use: Yes    Comment: SOCIALLY   Drug use: No     Allergies   Penicillins   Review of Systems Review of Systems  Constitutional: Positive for fever.  Respiratory: Negative for shortness of breath.   Cardiovascular: Negative for chest pain.  Gastrointestinal: Positive for abdominal pain, constipation and nausea. Negative for blood in stool, diarrhea and vomiting.  Genitourinary: Negative for dysuria, frequency and urgency.  Musculoskeletal: Negative for back pain.  Skin: Negative for rash and wound.  Allergic/Immunologic: Positive for immunocompromised state.  Neurological: Negative for weakness.  Hematological: Negative for adenopathy. Does not bruise/bleed easily.  Psychiatric/Behavioral: Negative for confusion.  All other systems reviewed and are negative.    Physical Exam Updated Vital Signs BP (!) 121/97 (BP Location: Left Arm)    Pulse 92    Temp (!) 100.5 F (38.1 C) (Oral)    Resp 20    SpO2 100%   Physical Exam Vitals signs and nursing note reviewed.  Constitutional:      General: She is not in acute distress.    Appearance: She is well-developed. She is not diaphoretic.  HENT:     Head: Normocephalic and atraumatic.  Cardiovascular:     Rate and Rhythm: Normal rate and regular rhythm.     Heart sounds: Normal heart sounds.  Pulmonary:     Effort: Pulmonary effort is normal.  Abdominal:     Palpations: Abdomen is soft.     Tenderness: There is abdominal tenderness in the left lower quadrant.  Skin:    General: Skin is warm and dry.     Findings: No erythema or rash.  Neurological:      Mental Status: She is alert and oriented to person, place, and time.  Psychiatric:        Behavior: Behavior normal.      ED Treatments / Results  Labs (all labs ordered are listed, but only abnormal results are displayed) Labs Reviewed  COMPREHENSIVE METABOLIC PANEL - Abnormal; Notable for the following components:      Result Value   Sodium 132 (*)    Chloride 97 (*)    Glucose, Bld 120 (*)    All other components within normal limits  CBC - Abnormal; Notable for the following components:   WBC 10.8 (*)    All other components within normal limits  URINALYSIS, ROUTINE  W REFLEX MICROSCOPIC - Abnormal; Notable for the following components:   Hgb urine dipstick MODERATE (*)    All other components within normal limits  PROTIME-INR - Abnormal; Notable for the following components:   Prothrombin Time 38.1 (*)    INR 4.0 (*)    All other components within normal limits  SARS CORONAVIRUS 2 (HOSPITAL ORDER, PERFORMED IN [facility])  LIPASE, BLOOD  LACTIC ACID, PLASMA  LACTIC ACID, PLASMA    EKG None  Radiology Ct Abdomen Pelvis W Contrast  Result Date: 03/29/2019 CLINICAL DATA:  One-week history of intermittent LEFT LOWER QUADRANT abdominal pain which acutely worsened today. Fever. Surgical history includes hysterectomy and unspecified small bowel surgery. EXAM: CT ABDOMEN AND PELVIS WITH CONTRAST TECHNIQUE: Multidetector CT imaging of the abdomen and pelvis was performed using the standard protocol following bolus administration of intravenous contrast. CONTRAST:  115mL OMNIPAQUE IOHEXOL 300 MG/ML IV. COMPARISON:  None. FINDINGS: Lower chest: Minimal linear atelectasis or scarring in the lower lobes. Visualized lung bases otherwise clear. Normal heart size. Hepatobiliary: Liver normal in size and appearance. Prominent Riedel's lobe. Gallbladder normal in appearance without calcified gallstones. No biliary ductal dilation. Pancreas: Normal in appearance without evidence  of mass, ductal dilation, or inflammation. Spleen: Normal in size and appearance. Accessory splenule MEDIAL to the LOWER pole. Adrenals/Urinary Tract: Normal appearing adrenal glands. Indeterminate 1 cm mass arising from the LOWER pole of the RIGHT kidney with Hounsfield measurements in the 60-70 range. Benign cortical cysts elsewhere in both kidneys. No hydronephrosis. No urinary tract calculi. Urinary bladder decompressed and unremarkable. Stomach/Bowel: Very small hiatal hernia. Stomach decompressed and otherwise unremarkable. Normal-appearing small bowel. Diffuse colonic diverticulosis, extensive in the sigmoid region, with acute inflammation involving the mid sigmoid colon. Small intramural abscesses involving this segment of the sigmoid colon. No extraluminal gas or abscess outside the colonic wall. Appendix not identified, but no evidence of pericecal inflammation. Vascular/Lymphatic: Moderate aorto-iliofemoral atherosclerosis without evidence of aneurysm. Visceral arteries atherosclerotic though patent. Normal-appearing portal venous and systemic venous systems. No pathologic lymphadenopathy. Reproductive: Surgically absent uterus. No adnexal masses. Other: Multiple phleboliths low in both sides of the pelvis. Very small umbilical hernia containing fat. Musculoskeletal: Slight thoracolumbar levoscoliosis. Degenerative disc disease involving the visualized thoracic spine, T12-L1, L1-2 and L2-3. Facet degenerative changes throughout the lumbar spine, worst at L4-5 and L5-S1. No acute findings. IMPRESSION: 1. Acute diverticulitis involving the mid sigmoid colon. Small intramural abscesses involving this segment of the colon. No evidence of perforation or abscess outside the colonic wall. 2. Indeterminate 1 cm mass arising from the lower pole of the right kidney. Non-emergent MRI of the abdomen without and with contrast is recommended in further evaluation to confirm or deny this finding. This recommendation  follows ACR consensus guidelines: Management of the Incidental Renal Mass on CT: A White Paper of the ACR Incidental Findings Committee. J Am Coll Radiol [id number]. 3. Very small hiatal hernia. Aortic Atherosclerosis (ICD10-170.0) Electronically Signed   By: Evangeline Dakin M.D.   On: 03/29/2019 20:41    Procedures Procedures (including critical care time)  Medications Ordered in ED Medications  sodium chloride flush (NS) 0.9 % injection 3 mL (has no administration in time range)  ondansetron (ZOFRAN) injection 4 mg (4 mg Intravenous Refused 03/29/19 1930)  morphine 2 MG/ML injection 2 mg (2 mg Intravenous Refused 03/29/19 1930)  ciprofloxacin (CIPRO) IVPB 400 mg (has no administration in time range)    And  metroNIDAZOLE (FLAGYL) IVPB 500 mg (500 mg Intravenous New  Bag/Given 03/29/19 2117)  sodium chloride 0.9 % bolus 500 mL (0 mLs Intravenous Stopped 03/29/19 1826)  iohexol (OMNIPAQUE) 300 MG/ML solution 100 mL (100 mLs Intravenous Contrast Given 03/29/19 1951)     Initial Impression / Assessment and Plan / ED Course  I have reviewed the triage vital signs and the nursing notes.  Pertinent labs & imaging results that were available during my care of the patient were reviewed by me and considered in my medical decision making (see chart for details).  Clinical Course as of Mar 28 2146  Sun Mar 28, 4362  2437 85 year old female here with few days of left lower quadrant abdominal pain in the setting of constipation.  She was found to have a fever here.  She said she is had diverticulitis in the past and has had intermittent constipation for a long time.  No rectal bleeding.  She is got some mild tenderness in her left lower quadrant without guarding or rebound.  She is getting labs CT urinalysis.  Disposition per results of testing.   [MB]  3238 85 year old female on Coumadin for aortic valve replacement presents with complaint of left lower quadrant abdominal pain and constipation for  the past several days.  On triage patient was found to be febrile with a temperature of 100.5, she was unaware of her fever previously.  On exam patient has moderate tenderness in left lower quadrant.  Review of lab work shows mild leukocytosis with white count of 10.8, CMP without significant changes, urinalysis negative for urinary tract infection.  INR is pending.  COVID negative.  Lactic acid within normal limits.  CT abdomen pelvis with contrast shows acute diverticulitis of the mid sigmoid colon.  Small intramural abscesses involving this segment of colon.  No evidence of perforation or abscess outside the colonic wall.  Incidental finding of 1 cm mass arising from the lower pole of the right kidney.   [LM]  2103 Case discussed with Dr. Redmond Pulling, on-call with general surgery who recommends hospitalist admission, consult with general surgery if needed.  Recommends bowel rest with IV antibiotics.  INR pending.   [LM]  2118 Discussed results and plan of care with patient, awaiting consult with hospitalist.   [LM]  2147 Case discussed with hospitalist who will consult for admission.   [LM]    Clinical Course User Index [LM] Tacy Learn, PA-C [MB] Hayden Rasmussen, MD      Final Clinical Impressions(s) / ED Diagnoses   Final diagnoses:  Diverticulitis of large intestine with abscess, unspecified bleeding status  Fever, unspecified  Right renal mass    ED Discharge Orders    None       Roque Lias 03/29/19 2147    Hayden Rasmussen, MD 03/31/19 [phone]

## 2019-03-29 NOTE — ED Notes (Signed)
Pt dtr returned call and updated. She also spoke to her mom.

## 2019-03-29 NOTE — ED Notes (Signed)
Pt made aware for the need for urine.

## 2019-03-29 NOTE — ED Notes (Signed)
ED TO INPATIENT HANDOFF REPORT  ED Nurse Name and Phone #: Benjamine Mola [phone]  S Name/Age/Gender Virginia Franklin 85 y.o. female Room/Bed: 563O/756E  Code Status   Code Status: Prior  Home/SNF/Other Home Patient oriented to: self, place, time and situation Is this baseline? Yes   Triage Complete: Triage complete  Chief Complaint constipation   Triage Note C/o intermittent LLQ pain x 1 week.  Pain worse today.  Also reports constipation with history of same.  Pt denies nausea, vomiting, and vomiting.  Fever 100.5 at present.  Pt was unaware of temp.   Allergies Allergies  Allergen Reactions  . Penicillins Anaphylaxis and Swelling    Has patient had a PCN reaction causing immediate rash, facial/tongue/throat swelling, SOB or lightheadedness with hypotension: Yes Has patient had a PCN reaction causing severe rash involving mucus membranes or skin necrosis: No Has patient had a PCN reaction that required hospitalization: No Has patient had a PCN reaction occurring within the last 10 years: No If all of the above answers are "NO", then may proceed with Cephalosporin use.    Level of Care/Admitting Diagnosis ED Disposition    ED Disposition Condition [facility] Area: [place] [[id number]]  Level of Care: Med-Surg [16]  Covid Evaluation: N/A  Diagnosis: Diverticulitis [332951]  Admitting Physician: Bernadette Hoit [[id number]]  Attending Physician: Bernadette Hoit [[id number]]  Estimated length of stay: past midnight tomorrow  Certification:: I certify this patient will need inpatient services for at least 2 midnights  PT Class (Do Not Modify): Inpatient [101]  PT Acc Code (Do Not Modify): Private [1]       B Medical/Surgery History Past Medical History:  Diagnosis Date  . Body mass index (BMI) of 22.0-22.9 in adult   . Cervical cancer ([facility] [place])   . H/O cervical fracture   . H/O osteoporosis   . H/O rheumatoid arthritis   . H/O: CVA  (cerebrovascular accident) 2012  . H/O: osteoarthritis   . History of urinary frequency   . Hx of diverticulitis of colon   . Hypertension    Past Surgical History:  Procedure Laterality Date  . ABDOMINAL HYSTERECTOMY    . CARDIAC SURGERY    . CATARACT EXTRACTION    . SMALL INTESTINE SURGERY       A IV Location/Drains/Wounds Patient Lines/Drains/Airways Status   Active Line/Drains/Airways    Name:   Placement date:   Placement time:   Site:   Days:   Peripheral IV 08/07/18 Right Forearm   08/07/18    1838    Forearm   234   Peripheral IV 03/29/19 Right Antecubital   03/29/19    1708    Antecubital   less than 1          Intake/Output Last 24 hours  Intake/Output Summary (Last 24 hours) at 03/29/2019 2209 Last data filed at 03/29/2019 1826 Gross per 24 hour  Intake 500 ml  Output -  Net 500 ml    Labs/Imaging Results for orders placed or performed during the hospital encounter of 03/29/19 (from the past 48 hour(s))  Urinalysis, Routine w reflex microscopic     Status: Abnormal   Collection Time: 03/29/19  3:55 PM  Result Value Ref Range   Color, Urine YELLOW YELLOW   APPearance CLEAR CLEAR   Specific Gravity, Urine 1.010 1.005 - 1.030   pH 6.0 5.0 - 8.0   Glucose, UA NEGATIVE NEGATIVE mg/dL   Hgb urine dipstick MODERATE (A) NEGATIVE  Bilirubin Urine NEGATIVE NEGATIVE   Ketones, ur NEGATIVE NEGATIVE mg/dL   Protein, ur NEGATIVE NEGATIVE mg/dL   Nitrite NEGATIVE NEGATIVE   Leukocytes,Ua NEGATIVE NEGATIVE   RBC / HPF 6-10 0 - 5 RBC/hpf   WBC, UA 0-5 0 - 5 WBC/hpf   Bacteria, UA NONE SEEN NONE SEEN    Comment: Performed at [facility] [address] [place], [place] [address]  Lipase, blood     Status: None   Collection Time: 03/29/19  4:29 PM  Result Value Ref Range   Lipase 20 11 - 51 U/L    Comment: Performed at [facility], [place] [address] [place], [place] [address]  Comprehensive metabolic panel     Status: Abnormal   Collection Time:  03/29/19  4:29 PM  Result Value Ref Range   Sodium 132 (L) 135 - 145 mmol/L   Potassium 3.7 3.5 - 5.1 mmol/L   Chloride 97 (L) 98 - 111 mmol/L   CO2 25 22 - 32 mmol/L   Glucose, Bld 120 (H) 70 - 99 mg/dL   BUN 12 8 - 23 mg/dL   Creatinine, Ser 0.82 0.44 - 1.00 mg/dL   Calcium 8.9 8.9 - 10.3 mg/dL   Total Protein 7.3 6.5 - 8.1 g/dL   Albumin 3.6 3.5 - 5.0 g/dL   AST 21 15 - 41 U/L   ALT 14 0 - 44 U/L   Alkaline Phosphatase 80 38 - 126 U/L   Total Bilirubin 0.9 0.3 - 1.2 mg/dL   GFR calc non Af Amer >60 >60 mL/min   GFR calc Af Amer >60 >60 mL/min   Anion gap 10 5 - 15    Comment: Performed at [facility], [place] [address] [place], [place] [address]  CBC     Status: Abnormal   Collection Time: 03/29/19  4:29 PM  Result Value Ref Range   WBC 10.8 (H) 4.0 - 10.5 K/uL   RBC 4.42 3.87 - 5.11 MIL/uL   Hemoglobin 13.2 12.0 - 15.0 g/dL   HCT 40.3 36.0 - 46.0 %   MCV 91.2 80.0 - 100.0 fL   MCH 29.9 26.0 - 34.0 pg   MCHC 32.8 30.0 - 36.0 g/dL   RDW 13.0 11.5 - 15.5 %   Platelets 256 150 - 400 K/uL   nRBC 0.0 0.0 - 0.2 %    Comment: Performed at [facility], [place] [address], [place], [place] [address]  Lactic acid, plasma     Status: None   Collection Time: 03/29/19  5:15 PM  Result Value Ref Range   Lactic Acid, Venous 0.8 0.5 - 1.9 mmol/L    Comment: Performed at [facility] [address], [place], [place] [address]  SARS Coronavirus 2 (CEPHEID - Performed in [facility] lab), Hosp Order     Status: None   Collection Time: 03/29/19  5:15 PM  Result Value Ref Range   SARS Coronavirus 2 NEGATIVE NEGATIVE    Comment: (NOTE) If result is NEGATIVE SARS-CoV-2 target nucleic acids are NOT DETECTED. The SARS-CoV-2 RNA is generally detectable in upper and lower  respiratory specimens during the acute phase of infection. The lowest  concentration of SARS-CoV-2 viral copies this assay can detect is 250  copies / mL. A negative result does not preclude  SARS-CoV-2 infection  and should not be used as the sole basis for treatment or other  patient management decisions.  A negative result  may occur with  improper specimen collection / handling, submission of specimen other  than nasopharyngeal swab, presence of viral mutation(s) within the  areas targeted by this assay, and inadequate number of viral copies  (<250 copies / mL). A negative result must be combined with clinical  observations, patient history, and epidemiological information. If result is POSITIVE SARS-CoV-2 target nucleic acids are DETECTED. The SARS-CoV-2 RNA is generally detectable in upper and lower  respiratory specimens dur ing the acute phase of infection.  Positive  results are indicative of active infection with SARS-CoV-2.  Clinical  correlation with patient history and other diagnostic information is  necessary to determine patient infection status.  Positive results do  not rule out bacterial infection or co-infection with other viruses. If result is PRESUMPTIVE POSTIVE SARS-CoV-2 nucleic acids MAY BE PRESENT.   A presumptive positive result was obtained on the submitted specimen  and confirmed on repeat testing.  While 2019 novel coronavirus  (SARS-CoV-2) nucleic acids may be present in the submitted sample  additional confirmatory testing may be necessary for epidemiological  and / or clinical management purposes  to differentiate between  SARS-CoV-2 and other Sarbecovirus currently known to infect humans.  If clinically indicated additional testing with an alternate test  methodology [phone]) is advised. The SARS-CoV-2 RNA is generally  detectable in upper and lower respiratory sp ecimens during the acute  phase of infection. The expected result is Negative. Fact Sheet for Patients:  StrictlyIdeas.no Fact Sheet for Healthcare Providers: BankingDealers.co.za This test is not yet approved or cleared by the  [place] FDA and has been authorized for detection and/or diagnosis of SARS-CoV-2 by FDA under an Emergency Use Authorization (EUA).  This EUA will remain in effect (meaning this test can be used) for the duration of the COVID-19 declaration under Section 564(b)(1) of the Act, 21 U.S.C. section 360bbb-3(b)(1), unless the authorization is terminated or revoked sooner. Performed at [facility], [place] [address] [place], [place] [address]   Protime-INR     Status: Abnormal   Collection Time: 03/29/19  9:07 PM  Result Value Ref Range   Prothrombin Time 38.1 (H) 11.4 - 15.2 seconds   INR 4.0 (H) 0.8 - 1.2    Comment: (NOTE) INR goal varies based on device and disease states. Performed at [facility], [place] [address] [place], [place] [address]    Ct Abdomen Pelvis W Contrast  Result Date: 03/29/2019 CLINICAL DATA:  One-week history of intermittent LEFT LOWER QUADRANT abdominal pain which acutely worsened today. Fever. Surgical history includes hysterectomy and unspecified small bowel surgery. EXAM: CT ABDOMEN AND PELVIS WITH CONTRAST TECHNIQUE: Multidetector CT imaging of the abdomen and pelvis was performed using the standard protocol following bolus administration of intravenous contrast. CONTRAST:  148mL OMNIPAQUE IOHEXOL 300 MG/ML IV. COMPARISON:  None. FINDINGS: Lower chest: Minimal linear atelectasis or scarring in the lower lobes. Visualized lung bases otherwise clear. Normal heart size. Hepatobiliary: Liver normal in size and appearance. Prominent Riedel's lobe. Gallbladder normal in appearance without calcified gallstones. No biliary ductal dilation. Pancreas: Normal in appearance without evidence of mass, ductal dilation, or inflammation. Spleen: Normal in size and appearance. Accessory splenule MEDIAL to the LOWER pole. Adrenals/Urinary Tract: Normal appearing adrenal glands. Indeterminate 1 cm mass arising from the LOWER pole of the RIGHT kidney with Hounsfield  measurements in the 60-70 range. Benign cortical cysts elsewhere in both kidneys. No hydronephrosis. No urinary tract calculi. Urinary bladder decompressed and unremarkable. Stomach/Bowel: Very small hiatal  hernia. Stomach decompressed and otherwise unremarkable. Normal-appearing small bowel. Diffuse colonic diverticulosis, extensive in the sigmoid region, with acute inflammation involving the mid sigmoid colon. Small intramural abscesses involving this segment of the sigmoid colon. No extraluminal gas or abscess outside the colonic wall. Appendix not identified, but no evidence of pericecal inflammation. Vascular/Lymphatic: Moderate aorto-iliofemoral atherosclerosis without evidence of aneurysm. Visceral arteries atherosclerotic though patent. Normal-appearing portal venous and systemic venous systems. No pathologic lymphadenopathy. Reproductive: Surgically absent uterus. No adnexal masses. Other: Multiple phleboliths low in both sides of the pelvis. Very small umbilical hernia containing fat. Musculoskeletal: Slight thoracolumbar levoscoliosis. Degenerative disc disease involving the visualized thoracic spine, T12-L1, L1-2 and L2-3. Facet degenerative changes throughout the lumbar spine, worst at L4-5 and L5-S1. No acute findings. IMPRESSION: 1. Acute diverticulitis involving the mid sigmoid colon. Small intramural abscesses involving this segment of the colon. No evidence of perforation or abscess outside the colonic wall. 2. Indeterminate 1 cm mass arising from the lower pole of the right kidney. Non-emergent MRI of the abdomen without and with contrast is recommended in further evaluation to confirm or deny this finding. This recommendation follows ACR consensus guidelines: Management of the Incidental Renal Mass on CT: A White Paper of the ACR Incidental Findings Committee. J Am Coll Radiol [phone]. 3. Very small hiatal hernia. Aortic Atherosclerosis (ICD10-170.0) Electronically Signed   By: Evangeline Dakin M.D.   On: 03/29/2019 20:41    Pending Labs Unresulted Labs (From admission, onward)    Start     Ordered   03/29/19 1645  Lactic acid, plasma  Now then every 2 hours,   STAT     03/29/19 1644          Vitals/Pain Today's Vitals   03/29/19 1730 03/29/19 1800 03/29/19 1930 03/29/19 2110  BP: (!) 147/59 134/64 138/61 (!) 121/97  Pulse: 76 72 69 92  Resp: 16 17 16 20   Temp:      TempSrc:      SpO2: 95% 97% 95% 100%  PainSc:        Isolation Precautions No active isolations  Medications Medications  sodium chloride flush (NS) 0.9 % injection 3 mL (has no administration in time range)  ondansetron (ZOFRAN) injection 4 mg (4 mg Intravenous Refused 03/29/19 1930)  morphine 2 MG/ML injection 2 mg (2 mg Intravenous Refused 03/29/19 1930)  ciprofloxacin (CIPRO) IVPB 400 mg (has no administration in time range)    And  metroNIDAZOLE (FLAGYL) IVPB 500 mg (500 mg Intravenous New Bag/Given 03/29/19 2117)  sodium chloride 0.9 % bolus 500 mL (0 mLs Intravenous Stopped 03/29/19 1826)  iohexol (OMNIPAQUE) 300 MG/ML solution 100 mL (100 mLs Intravenous Contrast Given 03/29/19 1951)    Mobility walks Low fall risk   Focused Assessments    R Recommendations: See Admitting Provider Note  Report given to:   Additional Notes:

## 2019-03-30 DIAGNOSIS — Z952 Presence of prosthetic heart valve: Secondary | ICD-10-CM

## 2019-03-30 LAB — COMPREHENSIVE METABOLIC PANEL
ALT: 12 U/L (ref 0–44)
AST: 16 U/L (ref 15–41)
Albumin: 3 g/dL — ABNORMAL LOW (ref 3.5–5.0)
Alkaline Phosphatase: 67 U/L (ref 38–126)
Anion gap: 6 (ref 5–15)
BUN: 8 mg/dL (ref 8–23)
CO2: 25 mmol/L (ref 22–32)
Calcium: 8.3 mg/dL — ABNORMAL LOW (ref 8.9–10.3)
Chloride: 101 mmol/L (ref 98–111)
Creatinine, Ser: 0.75 mg/dL (ref 0.44–1.00)
GFR calc Af Amer: >60 mL/min (ref >60)
GFR calc non Af Amer: >60 mL/min (ref >60)
Glucose, Bld: 125 mg/dL — ABNORMAL HIGH (ref 70–99)
Potassium: 3.4 mmol/L — ABNORMAL LOW (ref 3.5–5.1)
Sodium: 132 mmol/L — ABNORMAL LOW (ref 135–145)
Total Bilirubin: 0.7 mg/dL (ref 0.3–1.2)
Total Protein: 6.3 g/dL — ABNORMAL LOW (ref 6.5–8.1)

## 2019-03-30 LAB — CBC
HCT: 35.3 % — ABNORMAL LOW (ref 36.0–46.0)
Hemoglobin: 11.8 g/dL — ABNORMAL LOW (ref 12.0–15.0)
MCH: 30.4 pg (ref 26.0–34.0)
MCHC: 33.4 g/dL (ref 30.0–36.0)
MCV: 91 fL (ref 80.0–100.0)
Platelets: 221 10*3/uL (ref 150–400)
RBC: 3.88 MIL/uL (ref 3.87–5.11)
RDW: 13.2 % (ref 11.5–15.5)
WBC: 7.7 10*3/uL (ref 4.0–10.5)
nRBC: 0 % (ref 0.0–0.2)

## 2019-03-30 LAB — MAGNESIUM: Magnesium: 1.9 mg/dL (ref 1.7–2.4)

## 2019-03-30 LAB — PROTIME-INR
INR: 4.2 (ref 0.8–1.2)
Prothrombin Time: 39.7 seconds — ABNORMAL HIGH (ref 11.4–15.2)

## 2019-03-30 MED ORDER — METRONIDAZOLE 500 MG PO TABS
500.0000 mg | ORAL_TABLET | Freq: Three times a day (TID) | ORAL | Status: DC
  Administered 2019-03-30 – 2019-04-01 (×7): 500 mg via ORAL
  Filled 2019-03-30 (×7): qty 1

## 2019-03-30 MED ORDER — CIPROFLOXACIN HCL 500 MG PO TABS
500.0000 mg | ORAL_TABLET | Freq: Two times a day (BID) | ORAL | Status: DC
  Administered 2019-03-30: 500 mg via ORAL
  Filled 2019-03-30: qty 1

## 2019-03-30 MED ORDER — MORPHINE SULFATE (PF) 2 MG/ML IV SOLN
1.0000 mg | INTRAVENOUS | Status: DC | PRN
  Administered 2019-03-30: 1 mg via INTRAVENOUS
  Filled 2019-03-30: qty 1

## 2019-03-30 MED ORDER — ONDANSETRON HCL 4 MG/2ML IJ SOLN
4.0000 mg | Freq: Four times a day (QID) | INTRAMUSCULAR | Status: DC | PRN
  Administered 2019-03-30: 4 mg via INTRAVENOUS
  Filled 2019-03-30: qty 2

## 2019-03-30 NOTE — Progress Notes (Addendum)
PROGRESS NOTE    Virginia Franklin  GDJ:[id number] DOB: 10/04/1929 DOA: 03/29/2019 PCP: [name], MD   Brief Narrative: Virginia Franklin is a 85 y.o. female with a history of hypertension, hyperlipidemia, Alzheimer's disease, depression, mechanical AVR on chronic Coumadin.  Patient presented secondary to left lower quadrant pain was found to have acute diverticulitis.   Assessment & Plan:   Principal Problem:   Diverticulitis Active Problems:   Chronic anticoagulation   Aortic valve replaced   HTN (hypertension)   Dyslipidemia   Depression with anxiety   Alzheimer's dementia ([place])   Abdominal pain   Supratherapeutic INR   Kidney mass   Hyponatremia   Nausea   Constipation   Acute diverticulitis Patient improved quicker than expected today from yesterday while on IV antibiotics.  Pain is resolved this AM, but now returned.  With patient's age, high risk for discharge today in the setting of fever and leukocytosis on admission.  Cannot rule in or out sepsis.  Patient has a history of diverticulitis in the past. -Transition to oral ciprofloxacin and metronidazole today and watch overnight to ensure continued improvement of diverticulitis -Zofran PRN  Constipation -Continue MiraLAX and docusate  Mechanical aortic valve replacement On chronic anticoagulation with Coumadin.  INR 4.0 on admission which is increased to 4.2 today.  Coumadin currently held.  Goal INR is 2.5-3.5 secondary to mechanical valve. -Coumadin per pharmacy consult; currently holding  Hyponatremia Mild.  Stable.  Given some IV fluids overnight.  Essential hypertension -Continue lisinopril and Lasix  Hyperlipidemia -Continue ezetimibe-simvastatin  Dementia Patient currently lives in a retirement facility.  She is in [place] secondary to her daughter who is in town. -Continue Namenda  Depression Anxiety -Continue Lexapro  DVT prophylaxis: SCDs, Coumadin Code Status:   Code  Status: Prior Family Communication: None Disposition Plan: Discharge likely in 24 hours   Consultants:   None  Procedures:   None  Antimicrobials:  Ciprofloxacin IV (5/17>> 5/18)  Metronidazole IV (5/17>> 5/18)  Ciprofloxacin p.o. (5/18>>  Metronidazole p.o. (5/18>>   Subjective: Patient reports no abdominal pain nausea or vomiting.  Afebrile overnight.  Objective: Vitals:   03/29/19 2245 03/29/19 2309 03/29/19 2320 03/30/19 0420  BP: 130/81 (!) 180/67 (!) 148/74 (!) 133/55  Pulse: 69 67  67  Resp: 17     Temp:  98.5 F (36.9 C)  98.6 F (37 C)  TempSrc:  Oral  Oral  SpO2: 95% 99%  97%  Weight:      Height:        Intake/Output Summary (Last 24 hours) at 03/30/2019 1113 Last data filed at 03/30/2019 0442 Gross per 24 hour  Intake 791.36 ml  Output -  Net 791.36 ml   Filed Weights   03/29/19 2200  Weight: 62.6 kg    Examination:  General exam: Appears calm and comfortable Respiratory system: Clear to auscultation. Respiratory effort normal. Cardiovascular system: S1 & S2 heard, RRR. No murmurs, rubs, gallops.  Moderately loud mechanical click heard. Gastrointestinal system: Abdomen is nondistended, soft and nontender. No organomegaly or masses felt. Normal bowel sounds heard. Central nervous system: Alert and oriented. No focal neurological deficits. Extremities: No edema. No calf tenderness Skin: No cyanosis. No rashes Psychiatry: Judgement and insight appear normal. Mood & affect appropriate.     Data Reviewed: I have personally reviewed following labs and imaging studies  CBC: Recent Labs  Lab 03/29/19 1629 03/30/19 0023  WBC 10.8* 7.7  HGB 13.2 11.8*  HCT 40.3 35.3*  MCV 91.2 91.0  PLT 256 361   Basic Metabolic Panel: Recent Labs  Lab 03/29/19 1629 03/30/19 0023  NA 132* 132*  K 3.7 3.4*  CL 97* 101  CO2 25 25  GLUCOSE 120* 125*  BUN 12 8  CREATININE 0.82 0.75  CALCIUM 8.9 8.3*  MG  --  1.9   GFR: Estimated Creatinine  Clearance: 42.8 mL/min (by C-G formula based on SCr of 0.75 mg/dL). Liver Function Tests: Recent Labs  Lab 03/29/19 1629 03/30/19 0023  AST 21 16  ALT 14 12  ALKPHOS 80 67  BILITOT 0.9 0.7  PROT 7.3 6.3*  ALBUMIN 3.6 3.0*   Recent Labs  Lab 03/29/19 1629  LIPASE 20   No results for input(s): AMMONIA in the last 168 hours. Coagulation Profile: Recent Labs  Lab 03/29/19 2107 03/30/19 0023  INR 4.0* 4.2*   Cardiac Enzymes: No results for input(s): CKTOTAL, CKMB, CKMBINDEX, TROPONINI in the last 168 hours. BNP (last 3 results) No results for input(s): PROBNP in the last 8760 hours. HbA1C: No results for input(s): HGBA1C in the last 72 hours. CBG: No results for input(s): GLUCAP in the last 168 hours. Lipid Profile: No results for input(s): CHOL, HDL, LDLCALC, TRIG, CHOLHDL, LDLDIRECT in the last 72 hours. Thyroid Function Tests: No results for input(s): TSH, T4TOTAL, FREET4, T3FREE, THYROIDAB in the last 72 hours. Anemia Panel: No results for input(s): VITAMINB12, FOLATE, FERRITIN, TIBC, IRON, RETICCTPCT in the last 72 hours. Sepsis Labs: Recent Labs  Lab 03/29/19 1715  LATICACIDVEN 0.8    Recent Results (from the past 240 hour(s))  SARS Coronavirus 2 (CEPHEID - Performed in [facility] hospital lab), Hosp Order     Status: None   Collection Time: 03/29/19  5:15 PM  Result Value Ref Range Status   SARS Coronavirus 2 NEGATIVE NEGATIVE Final    Comment: (NOTE) If result is NEGATIVE SARS-CoV-2 target nucleic acids are NOT DETECTED. The SARS-CoV-2 RNA is generally detectable in upper and lower  respiratory specimens during the acute phase of infection. The lowest  concentration of SARS-CoV-2 viral copies this assay can detect is 250  copies / mL. A negative result does not preclude SARS-CoV-2 infection  and should not be used as the sole basis for treatment or other  patient management decisions.  A negative result may occur with  improper specimen collection /  handling, submission of specimen other  than nasopharyngeal swab, presence of viral mutation(s) within the  areas targeted by this assay, and inadequate number of viral copies  (<250 copies / mL). A negative result must be combined with clinical  observations, patient history, and epidemiological information. If result is POSITIVE SARS-CoV-2 target nucleic acids are DETECTED. The SARS-CoV-2 RNA is generally detectable in upper and lower  respiratory specimens dur ing the acute phase of infection.  Positive  results are indicative of active infection with SARS-CoV-2.  Clinical  correlation with patient history and other diagnostic information is  necessary to determine patient infection status.  Positive results do  not rule out bacterial infection or co-infection with other viruses. If result is PRESUMPTIVE POSTIVE SARS-CoV-2 nucleic acids MAY BE PRESENT.   A presumptive positive result was obtained on the submitted specimen  and confirmed on repeat testing.  While 2019 novel coronavirus  (SARS-CoV-2) nucleic acids may be present in the submitted sample  additional confirmatory testing may be necessary for epidemiological  and / or clinical management purposes  to differentiate between  SARS-CoV-2 and other Sarbecovirus currently  known to infect humans.  If clinically indicated additional testing with an alternate test  methodology [id number]) is advised. The SARS-CoV-2 RNA is generally  detectable in upper and lower respiratory sp ecimens during the acute  phase of infection. The expected result is Negative. Fact Sheet for Patients:  StrictlyIdeas.no Fact Sheet for Healthcare Providers: BankingDealers.co.za This test is not yet approved or cleared by the [place] FDA and has been authorized for detection and/or diagnosis of SARS-CoV-2 by FDA under an Emergency Use Authorization (EUA).  This EUA will remain in effect (meaning this  test can be used) for the duration of the COVID-19 declaration under Section 564(b)(1) of the Act, 21 U.S.C. section 360bbb-3(b)(1), unless the authorization is terminated or revoked sooner. Performed at [facility], [place] [address], [place], [place] [address]          Radiology Studies: Ct Abdomen Pelvis W Contrast  Result Date: 03/29/2019 CLINICAL DATA:  One-week history of intermittent LEFT LOWER QUADRANT abdominal pain which acutely worsened today. Fever. Surgical history includes hysterectomy and unspecified small bowel surgery. EXAM: CT ABDOMEN AND PELVIS WITH CONTRAST TECHNIQUE: Multidetector CT imaging of the abdomen and pelvis was performed using the standard protocol following bolus administration of intravenous contrast. CONTRAST:  168mL OMNIPAQUE IOHEXOL 300 MG/ML IV. COMPARISON:  None. FINDINGS: Lower chest: Minimal linear atelectasis or scarring in the lower lobes. Visualized lung bases otherwise clear. Normal heart size. Hepatobiliary: Liver normal in size and appearance. Prominent Riedel's lobe. Gallbladder normal in appearance without calcified gallstones. No biliary ductal dilation. Pancreas: Normal in appearance without evidence of mass, ductal dilation, or inflammation. Spleen: Normal in size and appearance. Accessory splenule MEDIAL to the LOWER pole. Adrenals/Urinary Tract: Normal appearing adrenal glands. Indeterminate 1 cm mass arising from the LOWER pole of the RIGHT kidney with Hounsfield measurements in the 60-70 range. Benign cortical cysts elsewhere in both kidneys. No hydronephrosis. No urinary tract calculi. Urinary bladder decompressed and unremarkable. Stomach/Bowel: Very small hiatal hernia. Stomach decompressed and otherwise unremarkable. Normal-appearing small bowel. Diffuse colonic diverticulosis, extensive in the sigmoid region, with acute inflammation involving the mid sigmoid colon. Small intramural abscesses involving this segment of the sigmoid colon.  No extraluminal gas or abscess outside the colonic wall. Appendix not identified, but no evidence of pericecal inflammation. Vascular/Lymphatic: Moderate aorto-iliofemoral atherosclerosis without evidence of aneurysm. Visceral arteries atherosclerotic though patent. Normal-appearing portal venous and systemic venous systems. No pathologic lymphadenopathy. Reproductive: Surgically absent uterus. No adnexal masses. Other: Multiple phleboliths low in both sides of the pelvis. Very small umbilical hernia containing fat. Musculoskeletal: Slight thoracolumbar levoscoliosis. Degenerative disc disease involving the visualized thoracic spine, T12-L1, L1-2 and L2-3. Facet degenerative changes throughout the lumbar spine, worst at L4-5 and L5-S1. No acute findings. IMPRESSION: 1. Acute diverticulitis involving the mid sigmoid colon. Small intramural abscesses involving this segment of the colon. No evidence of perforation or abscess outside the colonic wall. 2. Indeterminate 1 cm mass arising from the lower pole of the right kidney. Non-emergent MRI of the abdomen without and with contrast is recommended in further evaluation to confirm or deny this finding. This recommendation follows ACR consensus guidelines: Management of the Incidental Renal Mass on CT: A White Paper of the ACR Incidental Findings Committee. J Am Coll Radiol [id number]. 3. Very small hiatal hernia. Aortic Atherosclerosis (ICD10-170.0) Electronically Signed   By: Evangeline Dakin M.D.   On: 03/29/2019 20:41        Scheduled Meds: . ciprofloxacin  500 mg Oral BID  .  escitalopram  5 mg Oral Daily  . ezetimibe-simvastatin  1 tablet Oral Daily  . furosemide  20 mg Oral Daily  . lisinopril  10 mg Oral Daily  . memantine  10 mg Oral BID  . metroNIDAZOLE  500 mg Oral Q8H  .  morphine injection  2 mg Intravenous Once  . ondansetron (ZOFRAN) IV  4 mg Intravenous Once  . polyethylene glycol  17 g Oral Daily  . sodium chloride flush  3 mL  Intravenous Once   Continuous Infusions:   LOS: 1 day     Cordelia Poche, MD [facility] 03/30/2019, 11:13 AM  If 7PM-7AM, please contact night-coverage [email or web address]

## 2019-03-30 NOTE — Progress Notes (Signed)
Notified Dr. Lonny Prude of pt c/o of more pain and tenderness in the left lower abd area.

## 2019-03-30 NOTE — Social Work (Signed)
Pt is from [place] at [facility].  CSW continuing to follow for support with disposition when medically appropriate.  Westley Hummer, MSW, [place] Work [phone]

## 2019-03-30 NOTE — Progress Notes (Signed)
ANTICOAGULATION CONSULT NOTE - Initial Consult  Pharmacy Consult for Warfarin Indication: mechanical aortic valve replacement, hx of CVA  Allergies  Allergen Reactions  . Penicillins Anaphylaxis and Swelling    Has patient had a PCN reaction causing immediate rash, facial/tongue/throat swelling, SOB or lightheadedness with hypotension: Yes Has patient had a PCN reaction causing severe rash involving mucus membranes or skin necrosis: No Has patient had a PCN reaction that required hospitalization: No Has patient had a PCN reaction occurring within the last 10 years: No If all of the above answers are "NO", then may proceed with Cephalosporin use.    Patient Measurements: Height: 5\' 4"  (162.6 cm) Weight: 138 lb (62.6 kg) IBW/kg (Calculated) : 54.7  Vital Signs: Temp: 98.6 F (37 C) (05/18 0420) Temp Source: Oral (05/18 0420) BP: 133/55 (05/18 0420) Pulse Rate: 67 (05/18 0420)  Labs: Recent Labs    03/29/19 1629 03/29/19 2107 03/30/19 0023  HGB 13.2  --  11.8*  HCT 40.3  --  35.3*  PLT 256  --  221  LABPROT  --  38.1* 39.7*  INR  --  4.0* 4.2*  CREATININE 0.82  --  0.75    Estimated Creatinine Clearance: 42.8 mL/min (by C-G formula based on SCr of 0.75 mg/dL).   Medical History: Past Medical History:  Diagnosis Date  . Body mass index (BMI) of 22.0-22.9 in adult   . Cervical cancer ([place])   . H/O cervical fracture   . H/O osteoporosis   . H/O rheumatoid arthritis   . H/O: CVA (cerebrovascular accident) 2012  . H/O: osteoarthritis   . History of urinary frequency   . Hx of diverticulitis of colon   . Hypertension     Medications:  PTA warfarin 3.75mg  on Mon/Fri and 7.5mg  on Sun/Tues/Wed/Thurs/Sat, LD per patient on 5/17 at 1000  Assessment: 85 y/o F on warfarin PTA for hx of mechanical aortic valve replacement, CVA admitted on 03/29/2019 with acute diverticulitis. Pharmacy consulted for warfarin management while patient admitted. INR supratherapeutic at 4 on  admission.  Today, 03/30/19:  INR increased to 4.2 (no warfarin since last dose taken PTA)  CBC: Hgb 11.8, Pltc WNL  Major drug interactions: Cipro/Flagyl   Goal of Therapy:  INR 2.5-3.5 per outpatient Anti-coag clinic notes Monitor platelets by anticoagulation protocol: Yes   Plan:   Hold warfarin today due to supratherapeutic INR  Daily PT/INR  Monitor CBC and for s/sx of bleeding    Lindell Spar, PharmD, BCPS Pager: [phone] 03/30/2019 12:27 PM

## 2019-03-30 NOTE — Progress Notes (Signed)
CRITICAL VALUE ALERT  Critical Value:  INR 4.2  Date & Time Notifed:  05/18 : 3685  Provider Notified: O. Adefeso MD  Orders Received/Actions taken: no further actions noted.

## 2019-03-30 NOTE — Plan of Care (Signed)
  Problem: Pain Managment: Goal: General experience of comfort will improve Outcome: Progressing   Problem: Safety: Goal: Ability to remain free from injury will improve Outcome: Progressing   Problem: Skin Integrity: Goal: Risk for impaired skin integrity will decrease Outcome: Progressing   

## 2019-03-31 LAB — BASIC METABOLIC PANEL
Anion gap: 10 (ref 5–15)
BUN: 6 mg/dL — ABNORMAL LOW (ref 8–23)
CO2: 24 mmol/L (ref 22–32)
Calcium: 8.1 mg/dL — ABNORMAL LOW (ref 8.9–10.3)
Chloride: 102 mmol/L (ref 98–111)
Creatinine, Ser: 0.78 mg/dL (ref 0.44–1.00)
GFR calc Af Amer: >60 mL/min (ref >60)
GFR calc non Af Amer: >60 mL/min (ref >60)
Glucose, Bld: 93 mg/dL (ref 70–99)
Potassium: 3.5 mmol/L (ref 3.5–5.1)
Sodium: 136 mmol/L (ref 135–145)

## 2019-03-31 LAB — CBC
HCT: 34.2 % — ABNORMAL LOW (ref 36.0–46.0)
Hemoglobin: 11.3 g/dL — ABNORMAL LOW (ref 12.0–15.0)
MCH: 30 pg (ref 26.0–34.0)
MCHC: 33 g/dL (ref 30.0–36.0)
MCV: 90.7 fL (ref 80.0–100.0)
Platelets: 228 10*3/uL (ref 150–400)
RBC: 3.77 MIL/uL — ABNORMAL LOW (ref 3.87–5.11)
RDW: 13 % (ref 11.5–15.5)
WBC: 5.8 10*3/uL (ref 4.0–10.5)
nRBC: 0 % (ref 0.0–0.2)

## 2019-03-31 MED ORDER — POLYETHYLENE GLYCOL 3350 17 G PO PACK
17.0000 g | PACK | Freq: Two times a day (BID) | ORAL | Status: DC
  Filled 2019-03-31 (×2): qty 1

## 2019-03-31 MED ORDER — CIPROFLOXACIN HCL 500 MG PO TABS
500.0000 mg | ORAL_TABLET | Freq: Two times a day (BID) | ORAL | Status: DC
  Administered 2019-03-31 – 2019-04-01 (×3): 500 mg via ORAL
  Filled 2019-03-31 (×3): qty 1

## 2019-03-31 NOTE — Progress Notes (Signed)
Notified [facility] of pt critical INR of 4.7.

## 2019-03-31 NOTE — Progress Notes (Signed)
Patient tolerated clear liquids well.  Reports feeling hungry.  The patient reported having 1 loose diarrhea stool today.

## 2019-03-31 NOTE — Progress Notes (Signed)
ANTICOAGULATION CONSULT NOTE  Pharmacy Consult for Warfarin Indication: mechanical aortic valve replacement, hx of CVA  Allergies  Allergen Reactions  . Penicillins Anaphylaxis and Swelling    Has patient had a PCN reaction causing immediate rash, facial/tongue/throat swelling, SOB or lightheadedness with hypotension: Yes Has patient had a PCN reaction causing severe rash involving mucus membranes or skin necrosis: No Has patient had a PCN reaction that required hospitalization: No Has patient had a PCN reaction occurring within the last 10 years: No If all of the above answers are "NO", then may proceed with Cephalosporin use.    Patient Measurements: Height: 5\' 4"  (162.6 cm) Weight: 138 lb (62.6 kg) IBW/kg (Calculated) : 54.7  Vital Signs: Temp: 98.3 F (36.8 C) (05/19 0425) Temp Source: Oral (05/19 0425) BP: 137/63 (05/19 0425) Pulse Rate: 67 (05/19 0425)  Labs: Recent Labs    03/29/19 1629 03/29/19 2107 03/30/19 0023 03/31/19 0244  HGB 13.2  --  11.8* 11.3*  HCT 40.3  --  35.3* 34.2*  PLT 256  --  221 228  LABPROT  --  38.1* 39.7* 43.3*  INR  --  4.0* 4.2* 4.7*  CREATININE 0.82  --  0.75 0.78    Estimated Creatinine Clearance: 42.8 mL/min (by C-G formula based on SCr of 0.78 mg/dL).   Medical History: Past Medical History:  Diagnosis Date  . Body mass index (BMI) of 22.0-22.9 in adult   . Cervical cancer ([place])   . H/O cervical fracture   . H/O osteoporosis   . H/O rheumatoid arthritis   . H/O: CVA (cerebrovascular accident) 2012  . H/O: osteoarthritis   . History of urinary frequency   . Hx of diverticulitis of colon   . Hypertension     Medications:  PTA warfarin 3.75mg  on Mon/Fri and 7.5mg  on Sun/Tues/Wed/Thurs/Sat, LD per patient on 5/17 at 1000  Assessment: 85 y/o F on warfarin PTA for hx of mechanical aortic valve replacement, CVA admitted on 03/29/2019 with acute diverticulitis. Pharmacy consulted for warfarin management while patient admitted.  INR supratherapeutic at 4 on admission.  Today, 03/31/19:  INR increased to 4.7 (no warfarin since last dose taken PTA)  CBC: Hgb 11.3, Pltc WNL  Major drug interactions: Cipro/Flagyl   Goal of Therapy:  INR 2.5-3.5 per outpatient Anti-coag clinic notes Monitor platelets by anticoagulation protocol: Yes   Plan:   Continue to hold warfarin today due to supratherapeutic INR  Daily PT/INR  Monitor CBC and for s/sx of bleeding    Lindell Spar, PharmD, BCPS Pager: [phone] 03/31/2019 9:41 AM

## 2019-03-31 NOTE — Progress Notes (Signed)
PROGRESS NOTE    Virginia Franklin  UTM:[id number] DOB: February 19, 1929 DOA: 03/29/2019 PCP: [name], MD   Brief Narrative: Virginia Franklin is a 85 y.o. female with a history of hypertension, hyperlipidemia, Alzheimer's disease, depression, mechanical AVR on chronic Coumadin.  Patient presented secondary to left lower quadrant pain was found to have acute diverticulitis.   Assessment & Plan:   Principal Problem:   Diverticulitis Active Problems:   Chronic anticoagulation   Aortic valve replaced   HTN (hypertension)   Dyslipidemia   Depression with anxiety   Alzheimer's dementia ([place])   Abdominal pain   Supratherapeutic INR   Kidney mass   Hyponatremia   Nausea   Constipation   Acute diverticulitis Patient improved quicker than expected today from yesterday while on IV antibiotics.  Pain is resolved this AM, but now returned.  With patient's age, high risk for discharge today in the setting of fever and leukocytosis on admission.  Cannot rule in or out sepsis.  Patient has a history of diverticulitis in the past. -Continue oral ciprofloxacin and metronidazole -Zofran PRN  Constipation -Increase to MiraLAX BID and continue docusate  Mechanical aortic valve replacement Supratherapeutic INR On chronic anticoagulation with Coumadin.  INR 4.0 on admission which is increased to 4.7 today; secondary to antibiotic therapy which cannot be changed secondary to severe penicillin allergy. Coumadin currently held.  Goal INR is 2.5-3.5 secondary to mechanical valve. -Coumadin per pharmacy consult; currently holding -Daily INR -Watch for bleeding  Hyponatremia Resolved.  Essential hypertension -Continue lisinopril and Lasix  Hyperlipidemia -Continue ezetimibe-simvastatin  Dementia Patient currently lives in a retirement facility.  She is in [place] secondary to her daughter who is in town. Appears to be mild. -Continue Namenda  Depression Anxiety -Continue  Lexapro  Acute anemia Mild. Possibly related to some IV fluids. Has some oozing of IV site. -Trend CBC; will hold off on anemia panel  DVT prophylaxis: SCDs, Coumadin (on hold secondary to supratherapeutic INR) Code Status:   Code Status: Prior Family Communication: None Disposition Plan: Discharge pending improvement in INR   Consultants:   None  Procedures:   None  Antimicrobials:  Ciprofloxacin IV (5/17>> 5/18)  Metronidazole IV (5/17>> 5/18)  Ciprofloxacin p.o. (5/18>>  Metronidazole p.o. (5/18>>   Subjective: Some recurrent pain last night which improved with morphine. No pain overnight.  Objective: Vitals:   03/30/19 1230 03/30/19 2025 03/31/19 0425 03/31/19 1056  BP: (!) 141/57 (!) 123/94 137/63 137/63  Pulse: 65 73 67   Resp:  16 14   Temp: 97.6 F (36.4 C) 99 F (37.2 C) 98.3 F (36.8 C)   TempSrc: Oral Oral Oral   SpO2: 97% 95% 96%   Weight:      Height:        Intake/Output Summary (Last 24 hours) at 03/31/2019 1210 Last data filed at 03/31/2019 0900 Gross per 24 hour  Intake 785 ml  Output -  Net 785 ml   Filed Weights   03/29/19 2200  Weight: 62.6 kg    Examination:  General exam: Appears calm and comfortable Respiratory system: Clear to auscultation. Respiratory effort normal. Cardiovascular system: S1 & S2 heard, RRR. No murmurs, rubs, gallops. Moderately loud mechanical click. Gastrointestinal system: Abdomen is nondistended, soft and nontender. No organomegaly or masses felt. Normal bowel sounds heard. Central nervous system: Alert and oriented. No focal neurological deficits. Extremities: No edema. No calf tenderness Skin: No cyanosis. No rashes Psychiatry: Judgement and insight appear normal. Mood & affect appropriate.  Data Reviewed: I have personally reviewed following labs and imaging studies  CBC: Recent Labs  Lab 03/29/19 1629 03/30/19 0023 03/31/19 0244  WBC 10.8* 7.7 5.8  HGB 13.2 11.8* 11.3*  HCT 40.3  35.3* 34.2*  MCV 91.2 91.0 90.7  PLT 256 221 631   Basic Metabolic Panel: Recent Labs  Lab 03/29/19 1629 03/30/19 0023 03/31/19 0244  NA 132* 132* 136  K 3.7 3.4* 3.5  CL 97* 101 102  CO2 25 25 24   GLUCOSE 120* 125* 93  BUN 12 8 6*  CREATININE 0.82 0.75 0.78  CALCIUM 8.9 8.3* 8.1*  MG  --  1.9  --    GFR: Estimated Creatinine Clearance: 42.8 mL/min (by C-G formula based on SCr of 0.78 mg/dL). Liver Function Tests: Recent Labs  Lab 03/29/19 1629 03/30/19 0023  AST 21 16  ALT 14 12  ALKPHOS 80 67  BILITOT 0.9 0.7  PROT 7.3 6.3*  ALBUMIN 3.6 3.0*   Recent Labs  Lab 03/29/19 1629  LIPASE 20   No results for input(s): AMMONIA in the last 168 hours. Coagulation Profile: Recent Labs  Lab 03/29/19 2107 03/30/19 0023 03/31/19 0244  INR 4.0* 4.2* 4.7*   Cardiac Enzymes: No results for input(s): CKTOTAL, CKMB, CKMBINDEX, TROPONINI in the last 168 hours. BNP (last 3 results) No results for input(s): PROBNP in the last 8760 hours. HbA1C: No results for input(s): HGBA1C in the last 72 hours. CBG: No results for input(s): GLUCAP in the last 168 hours. Lipid Profile: No results for input(s): CHOL, HDL, LDLCALC, TRIG, CHOLHDL, LDLDIRECT in the last 72 hours. Thyroid Function Tests: No results for input(s): TSH, T4TOTAL, FREET4, T3FREE, THYROIDAB in the last 72 hours. Anemia Panel: No results for input(s): VITAMINB12, FOLATE, FERRITIN, TIBC, IRON, RETICCTPCT in the last 72 hours. Sepsis Labs: Recent Labs  Lab 03/29/19 1715  LATICACIDVEN 0.8    Recent Results (from the past 240 hour(s))  SARS Coronavirus 2 (CEPHEID - Performed in [facility] hospital lab), Hosp Order     Status: None   Collection Time: 03/29/19  5:15 PM  Result Value Ref Range Status   SARS Coronavirus 2 NEGATIVE NEGATIVE Final    Comment: (NOTE) If result is NEGATIVE SARS-CoV-2 target nucleic acids are NOT DETECTED. The SARS-CoV-2 RNA is generally detectable in upper and lower  respiratory  specimens during the acute phase of infection. The lowest  concentration of SARS-CoV-2 viral copies this assay can detect is 250  copies / mL. A negative result does not preclude SARS-CoV-2 infection  and should not be used as the sole basis for treatment or other  patient management decisions.  A negative result may occur with  improper specimen collection / handling, submission of specimen other  than nasopharyngeal swab, presence of viral mutation(s) within the  areas targeted by this assay, and inadequate number of viral copies  (<250 copies / mL). A negative result must be combined with clinical  observations, patient history, and epidemiological information. If result is POSITIVE SARS-CoV-2 target nucleic acids are DETECTED. The SARS-CoV-2 RNA is generally detectable in upper and lower  respiratory specimens dur ing the acute phase of infection.  Positive  results are indicative of active infection with SARS-CoV-2.  Clinical  correlation with patient history and other diagnostic information is  necessary to determine patient infection status.  Positive results do  not rule out bacterial infection or co-infection with other viruses. If result is PRESUMPTIVE POSTIVE SARS-CoV-2 nucleic acids MAY BE PRESENT.   A  presumptive positive result was obtained on the submitted specimen  and confirmed on repeat testing.  While 2019 novel coronavirus  (SARS-CoV-2) nucleic acids may be present in the submitted sample  additional confirmatory testing may be necessary for epidemiological  and / or clinical management purposes  to differentiate between  SARS-CoV-2 and other Sarbecovirus currently known to infect humans.  If clinically indicated additional testing with an alternate test  methodology [phone]) is advised. The SARS-CoV-2 RNA is generally  detectable in upper and lower respiratory sp ecimens during the acute  phase of infection. The expected result is Negative. Fact Sheet for  Patients:  StrictlyIdeas.no Fact Sheet for Healthcare Providers: BankingDealers.co.za This test is not yet approved or cleared by the [place] FDA and has been authorized for detection and/or diagnosis of SARS-CoV-2 by FDA under an Emergency Use Authorization (EUA).  This EUA will remain in effect (meaning this test can be used) for the duration of the COVID-19 declaration under Section 564(b)(1) of the Act, 21 U.S.C. section 360bbb-3(b)(1), unless the authorization is terminated or revoked sooner. Performed at [facility], [place] [address] [place], [place] [address]          Radiology Studies: Ct Abdomen Pelvis W Contrast  Result Date: 03/29/2019 CLINICAL DATA:  One-week history of intermittent LEFT LOWER QUADRANT abdominal pain which acutely worsened today. Fever. Surgical history includes hysterectomy and unspecified small bowel surgery. EXAM: CT ABDOMEN AND PELVIS WITH CONTRAST TECHNIQUE: Multidetector CT imaging of the abdomen and pelvis was performed using the standard protocol following bolus administration of intravenous contrast. CONTRAST:  130mL OMNIPAQUE IOHEXOL 300 MG/ML IV. COMPARISON:  None. FINDINGS: Lower chest: Minimal linear atelectasis or scarring in the lower lobes. Visualized lung bases otherwise clear. Normal heart size. Hepatobiliary: Liver normal in size and appearance. Prominent Riedel's lobe. Gallbladder normal in appearance without calcified gallstones. No biliary ductal dilation. Pancreas: Normal in appearance without evidence of mass, ductal dilation, or inflammation. Spleen: Normal in size and appearance. Accessory splenule MEDIAL to the LOWER pole. Adrenals/Urinary Tract: Normal appearing adrenal glands. Indeterminate 1 cm mass arising from the LOWER pole of the RIGHT kidney with Hounsfield measurements in the 60-70 range. Benign cortical cysts elsewhere in both kidneys. No hydronephrosis. No urinary tract  calculi. Urinary bladder decompressed and unremarkable. Stomach/Bowel: Very small hiatal hernia. Stomach decompressed and otherwise unremarkable. Normal-appearing small bowel. Diffuse colonic diverticulosis, extensive in the sigmoid region, with acute inflammation involving the mid sigmoid colon. Small intramural abscesses involving this segment of the sigmoid colon. No extraluminal gas or abscess outside the colonic wall. Appendix not identified, but no evidence of pericecal inflammation. Vascular/Lymphatic: Moderate aorto-iliofemoral atherosclerosis without evidence of aneurysm. Visceral arteries atherosclerotic though patent. Normal-appearing portal venous and systemic venous systems. No pathologic lymphadenopathy. Reproductive: Surgically absent uterus. No adnexal masses. Other: Multiple phleboliths low in both sides of the pelvis. Very small umbilical hernia containing fat. Musculoskeletal: Slight thoracolumbar levoscoliosis. Degenerative disc disease involving the visualized thoracic spine, T12-L1, L1-2 and L2-3. Facet degenerative changes throughout the lumbar spine, worst at L4-5 and L5-S1. No acute findings. IMPRESSION: 1. Acute diverticulitis involving the mid sigmoid colon. Small intramural abscesses involving this segment of the colon. No evidence of perforation or abscess outside the colonic wall. 2. Indeterminate 1 cm mass arising from the lower pole of the right kidney. Non-emergent MRI of the abdomen without and with contrast is recommended in further evaluation to confirm or deny this finding. This recommendation follows ACR consensus guidelines: Management of the Incidental Renal  Mass on CT: A White Paper of the ACR Incidental Findings Committee. J Am Coll Radiol [phone]. 3. Very small hiatal hernia. Aortic Atherosclerosis (ICD10-170.0) Electronically Signed   By: Evangeline Dakin M.D.   On: 03/29/2019 20:41        Scheduled Meds: . ciprofloxacin  500 mg Oral BID  . escitalopram  5  mg Oral Daily  . ezetimibe-simvastatin  1 tablet Oral Daily  . furosemide  20 mg Oral Daily  . lisinopril  10 mg Oral Daily  . memantine  10 mg Oral BID  . metroNIDAZOLE  500 mg Oral Q8H  .  morphine injection  2 mg Intravenous Once  . ondansetron (ZOFRAN) IV  4 mg Intravenous Once  . polyethylene glycol  17 g Oral BID  . sodium chloride flush  3 mL Intravenous Once   Continuous Infusions:   LOS: 2 days     Cordelia Poche, MD [facility] 03/31/2019, 12:10 PM  If 7PM-7AM, please contact night-coverage [email or web address]

## 2019-04-01 DIAGNOSIS — G300 Alzheimer's disease with early onset: Secondary | ICD-10-CM

## 2019-04-01 DIAGNOSIS — F0280 Dementia in other diseases classified elsewhere without behavioral disturbance: Secondary | ICD-10-CM

## 2019-04-01 LAB — CBC
HCT: 35.1 % — ABNORMAL LOW (ref 36.0–46.0)
Hemoglobin: 11.5 g/dL — ABNORMAL LOW (ref 12.0–15.0)
MCH: 29.6 pg (ref 26.0–34.0)
MCHC: 32.8 g/dL (ref 30.0–36.0)
MCV: 90.5 fL (ref 80.0–100.0)
Platelets: 244 10*3/uL (ref 150–400)
RBC: 3.88 MIL/uL (ref 3.87–5.11)
RDW: 13 % (ref 11.5–15.5)
WBC: 4.5 10*3/uL (ref 4.0–10.5)
nRBC: 0 % (ref 0.0–0.2)

## 2019-04-01 LAB — PROTIME-INR
INR: 4.9 (ref 0.8–1.2)
Prothrombin Time: 44.8 seconds — ABNORMAL HIGH (ref 11.4–15.2)

## 2019-04-01 MED ORDER — CIPROFLOXACIN HCL 500 MG PO TABS: 500.0000 mg | ORAL_TABLET | Freq: Two times a day (BID) | ORAL | 0 refills | Status: AC

## 2019-04-01 MED ORDER — METRONIDAZOLE 500 MG PO TABS: 500.0000 mg | ORAL_TABLET | Freq: Three times a day (TID) | ORAL | 0 refills | Status: AC

## 2019-04-01 MED FILL — CIPROFLOXACIN HCL 500 MG TA: 500 | 5 days supply | Qty: 10 | Fill #0

## 2019-04-01 MED FILL — metroNIDAZOLE 500 MG TABS: 500 | 5 days supply | Qty: 15 | Fill #0

## 2019-04-01 NOTE — Progress Notes (Signed)
The patient was taken to 2nd level [place] tower for discharge via wheel chair.  Waited approximately 10 minutes with the patient and the Police Officer sitting at the visitor control desk reported that her ride had left.  He stated that they became impatient and left. The arrangement was for the grandson to pick her up.  The nurse called her daughter [name] and she stated that the grandson had ordered food and went to pick it up and would be back.  [name] (daughter) stated that she lives in [place] and she would get in her car and pick the patient up.  She reported estimated time would be at least 45 minutes.  She seemed upset and irritated.  The patient was taken back to [place] and is awaiting for her ride to arrive.

## 2019-04-01 NOTE — TOC Transition Note (Addendum)
Transition of Care [facility]) - CM/SW Discharge Note   Patient Details  Name: Virginia Franklin MRN: [id number] Date of Birth: 12-18-1928  [facility] CM/SW Contact:  Marilu Favre, RN Phone Number: 04/01/2019, 4:31 PM   Clinical Narrative:     Spoke to patient at bedside. Confirmed face sheet information.   MD planning to order home health RN for INR check 5/22 and 5/25 and call to Dr Tamala Julian cardiology .  Patient has no preference on home health agency.   Patient states her grandson can pick her up and take her home.   Dr Wyline Copas speaking with daughter on phone at present.  Cory with [facility] checking with office to see if [facility] will accept referral. [facility] unable to accept referral .  Tiffany with [facility] also checking to see if [facility] can accept. Referral accepted.  Final next level of care: [place] Barriers to Discharge: No Barriers Identified   Patient Goals and CMS Choice Patient states their goals for this hospitalization and ongoing recovery are:: to go home  CMS Medicare.gov Compare Post Acute Care list provided to:: Patient Choice offered to / list presented to : Patient  Discharge Placement                       Discharge Plan and Services   Discharge Planning Services: CM Consult            DME Arranged: N/A DME Agency: NA       HH Arranged: RN [place] Agency: [place] Date [facility] Agency Contacted: 04/01/19 Time Franklin: 4627 Representative spoke with at [place]: [place] ([place]) Interventions     Readmission Risk Interventions No flowsheet data found.

## 2019-04-01 NOTE — Progress Notes (Signed)
ANTICOAGULATION CONSULT NOTE  Pharmacy Consult for Warfarin Indication: mechanical aortic valve replacement, hx of CVA  Allergies  Allergen Reactions  . Penicillins Anaphylaxis and Swelling    Has patient had a PCN reaction causing immediate rash, facial/tongue/throat swelling, SOB or lightheadedness with hypotension: Yes Has patient had a PCN reaction causing severe rash involving mucus membranes or skin necrosis: No Has patient had a PCN reaction that required hospitalization: No Has patient had a PCN reaction occurring within the last 10 years: No If all of the above answers are "NO", then may proceed with Cephalosporin use.    Patient Measurements: Height: 5\' 4"  (162.6 cm) Weight: 138 lb (62.6 kg) IBW/kg (Calculated) : 54.7  Vital Signs: Temp: 98.4 F (36.9 C) (05/20 0505) Temp Source: Oral (05/20 0505) BP: 135/58 (05/20 0855) Pulse Rate: 62 (05/20 0505)  Labs: Recent Labs    03/29/19 1629  03/30/19 0023 03/31/19 0244 04/01/19 0422  HGB 13.2  --  11.8* 11.3* 11.5*  HCT 40.3  --  35.3* 34.2* 35.1*  PLT 256  --  221 228 244  LABPROT  --    < > 39.7* 43.3* 44.8*  INR  --    < > 4.2* 4.7* 4.9*  CREATININE 0.82  --  0.75 0.78  --    < > = values in this interval not displayed.    Estimated Creatinine Clearance: 42.8 mL/min (by C-G formula based on SCr of 0.78 mg/dL).   Medical History: Past Medical History:  Diagnosis Date  . Body mass index (BMI) of 22.0-22.9 in adult   . Cervical cancer ([place])   . H/O cervical fracture   . H/O osteoporosis   . H/O rheumatoid arthritis   . H/O: CVA (cerebrovascular accident) 2012  . H/O: osteoarthritis   . History of urinary frequency   . Hx of diverticulitis of colon   . Hypertension     Medications:  PTA warfarin 3.75mg  on Mon/Fri and 7.5mg  on Sun/Tues/Wed/Thurs/Sat, LD per patient on 5/17 at 1000  Assessment: 85 y/o F on warfarin PTA for hx of mechanical aortic valve replacement, CVA admitted on 03/29/2019 with acute  diverticulitis. Pharmacy consulted for warfarin management while patient admitted. INR supratherapeutic at 4 on admission.  Today, 04/01/19:  INR increased to 4.9 (no warfarin since last dose taken PTA)  CBC: Hgb 11.5, Pltc WNL  Major drug interactions: Cipro/Flagyl   Goal of Therapy:  INR 2.5-3.5 per outpatient Anti-coag clinic notes Monitor platelets by anticoagulation protocol: Yes   Plan:   Continue to hold warfarin today due to supratherapeutic INR  Daily PT/INR  Monitor CBC and for s/sx of bleeding    Lindell Spar, PharmD, BCPS Pager: [phone] 04/01/2019 12:24 PM

## 2019-04-01 NOTE — Progress Notes (Signed)
Discharge instructions packet was provided to the patient.  The patient has a follow up lab appointment on June 1st.  The patient is being discharged with her daughter.  Home Health will follow up with the patient.

## 2019-04-01 NOTE — Discharge Summary (Signed)
Physician Discharge Summary  Virginia Franklin NWG:[id number] DOB: October 25, 1929 DOA: 03/29/2019  PCP: [name], MD  Admit date: 03/29/2019 Discharge date: 04/01/2019  Admitted From: [name] Disposition:  [name]  Recommendations for Outpatient Follow-up:  1. Follow up with PCP in 1-2 weeks 2. Please check INR 5/22 and again 5/25, then every 3 days until off antibiotic and INR stabilizes. Please send report to Dr. Daneen Schick [facility]: Phone:[phone]  Home Health:RN   Discharge Condition:Improved CODE STATUS:Full Diet recommendation: Heart healthy   Brief/Interim Summary:  85 y.o. female with a history of hypertension, hyperlipidemia, Alzheimer's disease, depression, mechanical AVR on chronic Coumadin.  Patient presented secondary to left lower quadrant pain was found to have acute diverticulitis.  Discharge Diagnoses:  Principal Problem:   Diverticulitis Active Problems:   Chronic anticoagulation   Aortic valve replaced   HTN (hypertension)   Dyslipidemia   Depression with anxiety   Alzheimer's dementia (HCC)   Abdominal pain   Supratherapeutic INR   Kidney mass   Hyponatremia   Nausea   Constipation   Acute diverticulitis with sepsis present on admission Patient improved quicker than expected today while on IV antibiotics. -continued on cipro/flagyl -Not candidate for penicillin secondary to severe allergic reaction -On day of discharge, patient reported feeling hungry and tolerated soft diet without issue -Recommend completing one week of abx  Constipation -Increase to MiraLAX BID and continue docusate  Mechanical aortic valve replacement Supratherapeutic INR On chronic anticoagulation with Coumadin.  INR 4.0 on admission which is increased to 4.9 today; secondary to antibiotic therapy which cannot be changed secondary to severe penicillin allergy. Coumadin currently held.  Goal INR is 2.5-3.5 secondary to mechanical  valve. -Coumadin trend on graph seems to show near plateau of INR -Coumadin per pharmacy consult; currently holding -Given supertherapeutic INR and patient's advanced age and risk of life threatening bleed, recommend close INR checks, advise limiting traveling if possible to decrease risk of trauma  Hyponatremia -Resolved.  Essential hypertension -Continue lisinopril and Lasix as tolerated  Hyperlipidemia -Continue ezetimibe-simvastatin  Dementia Patient currently lives in a retirement facility.  She is in [place] secondary to her daughter who is in town.  -Continue Namenda as tolerated  Depression Anxiety -Continue Lexapro  Acute anemia Mild. Possibly related to some IV fluids. Has some oozing of IV site. -CBC trends reviewed. Stable thus far   Discharge Instructions   Allergies as of 04/01/2019      Reactions   Penicillins Anaphylaxis, Swelling   Has patient had a PCN reaction causing immediate rash, facial/tongue/throat swelling, SOB or lightheadedness with hypotension: Yes Has patient had a PCN reaction causing severe rash involving mucus membranes or skin necrosis: No Has patient had a PCN reaction that required hospitalization: No Has patient had a PCN reaction occurring within the last 10 years: No If all of the above answers are "NO", then may proceed with Cephalosporin use.      Medication List    STOP taking these medications   warfarin 7.5 MG tablet Commonly known as:  COUMADIN     TAKE these medications   acetaminophen 650 MG CR tablet Commonly known as:  TYLENOL Take 650 mg by mouth every 8 (eight) hours as needed for pain (or headaches).   b complex vitamins tablet Take 2 tablets by mouth daily.   CALCIUM + D PO Take 2 tablets by mouth daily with breakfast.   ciprofloxacin 500 MG tablet Commonly known as:  CIPRO Take 1 tablet (  500 mg total) by mouth 2 (two) times daily for 5 days.   diclofenac sodium 1 % Gel Commonly known as:   Voltaren Apply 2 g topically 4 (four) times daily.   escitalopram 5 MG tablet Commonly known as:  LEXAPRO Take 1 tablet (5 mg total) by mouth daily.   ezetimibe-simvastatin 10-40 MG tablet Commonly known as:  VYTORIN Take 1 tablet by mouth daily.   Fish Oil 1000 MG Caps Take 2,000 mg by mouth every morning.   furosemide 20 MG tablet Commonly known as:  LASIX Take 1 tablet (20 mg total) by mouth daily.   lisinopril 10 MG tablet Commonly known as:  ZESTRIL Take 1 tablet (10 mg total) by mouth daily.   memantine 10 MG tablet Commonly known as:  Namenda Take 1 tablet (10 mg total) by mouth 2 (two) times daily. Take 1 tablet twice a day What changed:  additional instructions   metroNIDAZOLE 500 MG tablet Commonly known as:  FLAGYL Take 1 tablet (500 mg total) by mouth every 8 (eight) hours for 5 days.   TURMERIC PO Take 3 capsules by mouth every morning. Turmeric with BioPerine       Allergies  Allergen Reactions  . Penicillins Anaphylaxis and Swelling    Has patient had a PCN reaction causing immediate rash, facial/tongue/throat swelling, SOB or lightheadedness with hypotension: Yes Has patient had a PCN reaction causing severe rash involving mucus membranes or skin necrosis: No Has patient had a PCN reaction that required hospitalization: No Has patient had a PCN reaction occurring within the last 10 years: No If all of the above answers are "NO", then may proceed with Cephalosporin use.     Procedures/Studies: Ct Abdomen Pelvis W Contrast  Result Date: 03/29/2019 CLINICAL DATA:  One-week history of intermittent LEFT LOWER QUADRANT abdominal pain which acutely worsened today. Fever. Surgical history includes hysterectomy and unspecified small bowel surgery. EXAM: CT ABDOMEN AND PELVIS WITH CONTRAST TECHNIQUE: Multidetector CT imaging of the abdomen and pelvis was performed using the standard protocol following bolus administration of intravenous contrast. CONTRAST:   186mL OMNIPAQUE IOHEXOL 300 MG/ML IV. COMPARISON:  None. FINDINGS: Lower chest: Minimal linear atelectasis or scarring in the lower lobes. Visualized lung bases otherwise clear. Normal heart size. Hepatobiliary: Liver normal in size and appearance. Prominent Riedel's lobe. Gallbladder normal in appearance without calcified gallstones. No biliary ductal dilation. Pancreas: Normal in appearance without evidence of mass, ductal dilation, or inflammation. Spleen: Normal in size and appearance. Accessory splenule MEDIAL to the LOWER pole. Adrenals/Urinary Tract: Normal appearing adrenal glands. Indeterminate 1 cm mass arising from the LOWER pole of the RIGHT kidney with Hounsfield measurements in the 60-70 range. Benign cortical cysts elsewhere in both kidneys. No hydronephrosis. No urinary tract calculi. Urinary bladder decompressed and unremarkable. Stomach/Bowel: Very small hiatal hernia. Stomach decompressed and otherwise unremarkable. Normal-appearing small bowel. Diffuse colonic diverticulosis, extensive in the sigmoid region, with acute inflammation involving the mid sigmoid colon. Small intramural abscesses involving this segment of the sigmoid colon. No extraluminal gas or abscess outside the colonic wall. Appendix not identified, but no evidence of pericecal inflammation. Vascular/Lymphatic: Moderate aorto-iliofemoral atherosclerosis without evidence of aneurysm. Visceral arteries atherosclerotic though patent. Normal-appearing portal venous and systemic venous systems. No pathologic lymphadenopathy. Reproductive: Surgically absent uterus. No adnexal masses. Other: Multiple phleboliths low in both sides of the pelvis. Very small umbilical hernia containing fat. Musculoskeletal: Slight thoracolumbar levoscoliosis. Degenerative disc disease involving the visualized thoracic spine, T12-L1, L1-2 and L2-3. Facet degenerative changes throughout  the lumbar spine, worst at L4-5 and L5-S1. No acute findings. IMPRESSION:  1. Acute diverticulitis involving the mid sigmoid colon. Small intramural abscesses involving this segment of the colon. No evidence of perforation or abscess outside the colonic wall. 2. Indeterminate 1 cm mass arising from the lower pole of the right kidney. Non-emergent MRI of the abdomen without and with contrast is recommended in further evaluation to confirm or deny this finding. This recommendation follows ACR consensus guidelines: Management of the Incidental Renal Mass on CT: A White Paper of the ACR Incidental Findings Committee. J Am Coll Radiol [phone]. 3. Very small hiatal hernia. Aortic Atherosclerosis (ICD10-170.0) Electronically Signed   By: Evangeline Dakin M.D.   On: 03/29/2019 20:41     Subjective: Eager to go home  Discharge Exam: Vitals:   04/01/19 0855 04/01/19 1349  BP: (!) 135/58 (!) 147/67  Pulse:  69  Resp:  16  Temp:  98.2 F (36.8 C)  SpO2:  96%   Vitals:   03/31/19 2049 04/01/19 0505 04/01/19 0855 04/01/19 1349  BP: 138/66 (!) 135/58 (!) 135/58 (!) 147/67  Pulse: 70 62  69  Resp: 16 15  16   Temp: 98.5 F (36.9 C) 98.4 F (36.9 C)  98.2 F (36.8 C)  TempSrc: Oral Oral  Oral  SpO2: 97% 100%  96%  Weight:      Height:        General: Pt is alert, awake, not in acute distress Cardiovascular: RRR, S1/S2 +, no rubs, no gallops Respiratory: CTA bilaterally, no wheezing, no rhonchi Abdominal: Soft, NT, ND, bowel sounds + Extremities: no edema, no cyanosis   The results of significant diagnostics from this hospitalization (including imaging, microbiology, ancillary and laboratory) are listed below for reference.     Microbiology: Recent Results (from the past 240 hour(s))  SARS Coronavirus 2 (CEPHEID - Performed in [facility] hospital lab), Hosp Order     Status: None   Collection Time: 03/29/19  5:15 PM  Result Value Ref Range Status   SARS Coronavirus 2 NEGATIVE NEGATIVE Final    Comment: (NOTE) If result is NEGATIVE SARS-CoV-2 target  nucleic acids are NOT DETECTED. The SARS-CoV-2 RNA is generally detectable in upper and lower  respiratory specimens during the acute phase of infection. The lowest  concentration of SARS-CoV-2 viral copies this assay can detect is 250  copies / mL. A negative result does not preclude SARS-CoV-2 infection  and should not be used as the sole basis for treatment or other  patient management decisions.  A negative result may occur with  improper specimen collection / handling, submission of specimen other  than nasopharyngeal swab, presence of viral mutation(s) within the  areas targeted by this assay, and inadequate number of viral copies  (<250 copies / mL). A negative result must be combined with clinical  observations, patient history, and epidemiological information. If result is POSITIVE SARS-CoV-2 target nucleic acids are DETECTED. The SARS-CoV-2 RNA is generally detectable in upper and lower  respiratory specimens dur ing the acute phase of infection.  Positive  results are indicative of active infection with SARS-CoV-2.  Clinical  correlation with patient history and other diagnostic information is  necessary to determine patient infection status.  Positive results do  not rule out bacterial infection or co-infection with other viruses. If result is PRESUMPTIVE POSTIVE SARS-CoV-2 nucleic acids MAY BE PRESENT.   A presumptive positive result was obtained on the submitted specimen  and confirmed on repeat testing.  While  2019 novel coronavirus  (SARS-CoV-2) nucleic acids may be present in the submitted sample  additional confirmatory testing may be necessary for epidemiological  and / or clinical management purposes  to differentiate between  SARS-CoV-2 and other Sarbecovirus currently known to infect humans.  If clinically indicated additional testing with an alternate test  methodology [phone]) is advised. The SARS-CoV-2 RNA is generally  detectable in upper and lower  respiratory sp ecimens during the acute  phase of infection. The expected result is Negative. Fact Sheet for Patients:  StrictlyIdeas.no Fact Sheet for Healthcare Providers: BankingDealers.co.za This test is not yet approved or cleared by the [place] FDA and has been authorized for detection and/or diagnosis of SARS-CoV-2 by FDA under an Emergency Use Authorization (EUA).  This EUA will remain in effect (meaning this test can be used) for the duration of the COVID-19 declaration under Section 564(b)(1) of the Act, 21 U.S.C. section 360bbb-3(b)(1), unless the authorization is terminated or revoked sooner. Performed at [facility], [place] [address], [place], [place] [address]      Labs: BNP (last 3 results) No results for input(s): BNP in the last 8760 hours. Basic Metabolic Panel: Recent Labs  Lab 03/29/19 1629 03/30/19 0023 03/31/19 0244  NA 132* 132* 136  K 3.7 3.4* 3.5  CL 97* 101 102  CO2 25 25 24   GLUCOSE 120* 125* 93  BUN 12 8 6*  CREATININE 0.82 0.75 0.78  CALCIUM 8.9 8.3* 8.1*  MG  --  1.9  --    Liver Function Tests: Recent Labs  Lab 03/29/19 1629 03/30/19 0023  AST 21 16  ALT 14 12  ALKPHOS 80 67  BILITOT 0.9 0.7  PROT 7.3 6.3*  ALBUMIN 3.6 3.0*   Recent Labs  Lab 03/29/19 1629  LIPASE 20   No results for input(s): AMMONIA in the last 168 hours. CBC: Recent Labs  Lab 03/29/19 1629 03/30/19 0023 03/31/19 0244 04/01/19 0422  WBC 10.8* 7.7 5.8 4.5  HGB 13.2 11.8* 11.3* 11.5*  HCT 40.3 35.3* 34.2* 35.1*  MCV 91.2 91.0 90.7 90.5  PLT 256 221 228 244   Cardiac Enzymes: No results for input(s): CKTOTAL, CKMB, CKMBINDEX, TROPONINI in the last 168 hours. BNP: Invalid input(s): POCBNP CBG: No results for input(s): GLUCAP in the last 168 hours. D-Dimer No results for input(s): DDIMER in the last 72 hours. Hgb A1c No results for input(s): HGBA1C in the last 72 hours. Lipid Profile No  results for input(s): CHOL, HDL, LDLCALC, TRIG, CHOLHDL, LDLDIRECT in the last 72 hours. Thyroid function studies No results for input(s): TSH, T4TOTAL, T3FREE, THYROIDAB in the last 72 hours.  Invalid input(s): FREET3 Anemia work up No results for input(s): VITAMINB12, FOLATE, FERRITIN, TIBC, IRON, RETICCTPCT in the last 72 hours. Urinalysis    Component Value Date/Time   COLORURINE YELLOW 03/29/2019 [place] 03/29/2019 1555   LABSPEC 1.010 03/29/2019 1555   PHURINE 6.0 03/29/2019 [place] 03/29/2019 1555   HGBUR MODERATE (A) 03/29/2019 [place] 03/29/2019 1555   BILIRUBINUR Negative 06/17/2018 1756   KETONESUR NEGATIVE 03/29/2019 1555   PROTEINUR NEGATIVE 03/29/2019 1555   UROBILINOGEN 0.2 06/17/2018 1756   NITRITE NEGATIVE 03/29/2019 1555   LEUKOCYTESUR NEGATIVE 03/29/2019 1555   Sepsis Labs Invalid input(s): PROCALCITONIN,  WBC,  LACTICIDVEN Microbiology Recent Results (from the past 240 hour(s))  SARS Coronavirus 2 (CEPHEID - Performed in [place] hospital lab), Hosp Order     Status:  None   Collection Time: 03/29/19  5:15 PM  Result Value Ref Range Status   SARS Coronavirus 2 NEGATIVE NEGATIVE Final    Comment: (NOTE) If result is NEGATIVE SARS-CoV-2 target nucleic acids are NOT DETECTED. The SARS-CoV-2 RNA is generally detectable in upper and lower  respiratory specimens during the acute phase of infection. The lowest  concentration of SARS-CoV-2 viral copies this assay can detect is 250  copies / mL. A negative result does not preclude SARS-CoV-2 infection  and should not be used as the sole basis for treatment or other  patient management decisions.  A negative result may occur with  improper specimen collection / handling, submission of specimen other  than nasopharyngeal swab, presence of viral mutation(s) within the  areas targeted by this assay, and inadequate number of viral copies  (<250 copies / mL). A  negative result must be combined with clinical  observations, patient history, and epidemiological information. If result is POSITIVE SARS-CoV-2 target nucleic acids are DETECTED. The SARS-CoV-2 RNA is generally detectable in upper and lower  respiratory specimens dur ing the acute phase of infection.  Positive  results are indicative of active infection with SARS-CoV-2.  Clinical  correlation with patient history and other diagnostic information is  necessary to determine patient infection status.  Positive results do  not rule out bacterial infection or co-infection with other viruses. If result is PRESUMPTIVE POSTIVE SARS-CoV-2 nucleic acids MAY BE PRESENT.   A presumptive positive result was obtained on the submitted specimen  and confirmed on repeat testing.  While 2019 novel coronavirus  (SARS-CoV-2) nucleic acids may be present in the submitted sample  additional confirmatory testing may be necessary for epidemiological  and / or clinical management purposes  to differentiate between  SARS-CoV-2 and other Sarbecovirus currently known to infect humans.  If clinically indicated additional testing with an alternate test  methodology [phone]) is advised. The SARS-CoV-2 RNA is generally  detectable in upper and lower respiratory sp ecimens during the acute  phase of infection. The expected result is Negative. Fact Sheet for Patients:  StrictlyIdeas.no Fact Sheet for Healthcare Providers: BankingDealers.co.za This test is not yet approved or cleared by the [place] FDA and has been authorized for detection and/or diagnosis of SARS-CoV-2 by FDA under an Emergency Use Authorization (EUA).  This EUA will remain in effect (meaning this test can be used) for the duration of the COVID-19 declaration under Section 564(b)(1) of the Act, 21 U.S.C. section 360bbb-3(b)(1), unless the authorization is terminated or revoked sooner. Performed  at [facility], [place] [address] [place], [place] [address]    Time spent: 58min  SIGNED:   Marylu Lund, MD  [facility] 04/01/2019, 4:40 PM  If 7PM-7AM, please contact night-coverage

## 2019-04-02 DIAGNOSIS — I959 Hypotension, unspecified: Secondary | ICD-10-CM | POA: Diagnosis not present

## 2019-04-02 DIAGNOSIS — R42 Dizziness and giddiness: Secondary | ICD-10-CM | POA: Diagnosis not present

## 2019-04-02 DIAGNOSIS — R52 Pain, unspecified: Secondary | ICD-10-CM | POA: Diagnosis not present

## 2019-04-02 DIAGNOSIS — R110 Nausea: Secondary | ICD-10-CM | POA: Diagnosis not present

## 2019-04-03 ENCOUNTER — Ambulatory Visit (INDEPENDENT_AMBULATORY_CARE_PROVIDER_SITE_OTHER): Payer: Medicare HMO | Admitting: Pharmacist Clinician (PhC)/ Clinical Pharmacy Specialist

## 2019-04-03 DIAGNOSIS — Z7901 Long term (current) use of anticoagulants: Secondary | ICD-10-CM

## 2019-04-03 DIAGNOSIS — M069 Rheumatoid arthritis, unspecified: Secondary | ICD-10-CM | POA: Diagnosis not present

## 2019-04-03 DIAGNOSIS — G309 Alzheimer's disease, unspecified: Secondary | ICD-10-CM | POA: Diagnosis not present

## 2019-04-03 DIAGNOSIS — E785 Hyperlipidemia, unspecified: Secondary | ICD-10-CM | POA: Diagnosis not present

## 2019-04-03 DIAGNOSIS — I10 Essential (primary) hypertension: Secondary | ICD-10-CM | POA: Diagnosis not present

## 2019-04-03 DIAGNOSIS — Z5181 Encounter for therapeutic drug level monitoring: Secondary | ICD-10-CM

## 2019-04-03 DIAGNOSIS — R69 Illness, unspecified: Secondary | ICD-10-CM | POA: Diagnosis not present

## 2019-04-03 DIAGNOSIS — Z952 Presence of prosthetic heart valve: Secondary | ICD-10-CM | POA: Diagnosis not present

## 2019-04-03 DIAGNOSIS — M810 Age-related osteoporosis without current pathological fracture: Secondary | ICD-10-CM | POA: Diagnosis not present

## 2019-04-03 DIAGNOSIS — K5732 Diverticulitis of large intestine without perforation or abscess without bleeding: Secondary | ICD-10-CM | POA: Diagnosis not present

## 2019-04-03 DIAGNOSIS — A419 Sepsis, unspecified organism: Secondary | ICD-10-CM | POA: Diagnosis not present

## 2019-04-03 DIAGNOSIS — I4891 Unspecified atrial fibrillation: Secondary | ICD-10-CM | POA: Diagnosis not present

## 2019-04-03 LAB — POCT INR: INR: 2.6 (ref 2.0–3.0)

## 2019-04-07 LAB — PROTIME-INR
INR: 4.7 (ref 0.8–1.2)
Prothrombin Time: 43.3 seconds — ABNORMAL HIGH (ref 11.4–15.2)

## 2019-04-08 ENCOUNTER — Other Ambulatory Visit: Payer: Self-pay | Admitting: Family Medicine

## 2019-04-08 ENCOUNTER — Telehealth: Payer: Self-pay | Admitting: Interventional Cardiology

## 2019-04-08 ENCOUNTER — Telehealth: Payer: Self-pay

## 2019-04-08 ENCOUNTER — Telehealth: Payer: Self-pay | Admitting: *Deleted

## 2019-04-08 DIAGNOSIS — K5732 Diverticulitis of large intestine without perforation or abscess without bleeding: Secondary | ICD-10-CM | POA: Diagnosis not present

## 2019-04-08 DIAGNOSIS — E785 Hyperlipidemia, unspecified: Secondary | ICD-10-CM | POA: Diagnosis not present

## 2019-04-08 DIAGNOSIS — I4891 Unspecified atrial fibrillation: Secondary | ICD-10-CM | POA: Diagnosis not present

## 2019-04-08 DIAGNOSIS — M810 Age-related osteoporosis without current pathological fracture: Secondary | ICD-10-CM | POA: Diagnosis not present

## 2019-04-08 DIAGNOSIS — I10 Essential (primary) hypertension: Secondary | ICD-10-CM | POA: Diagnosis not present

## 2019-04-08 DIAGNOSIS — M069 Rheumatoid arthritis, unspecified: Secondary | ICD-10-CM | POA: Diagnosis not present

## 2019-04-08 DIAGNOSIS — R69 Illness, unspecified: Secondary | ICD-10-CM | POA: Diagnosis not present

## 2019-04-08 DIAGNOSIS — A419 Sepsis, unspecified organism: Secondary | ICD-10-CM | POA: Diagnosis not present

## 2019-04-08 DIAGNOSIS — G309 Alzheimer's disease, unspecified: Secondary | ICD-10-CM | POA: Diagnosis not present

## 2019-04-08 NOTE — Telephone Encounter (Signed)
Spoke with Virginia Franklin and made her aware that she would need to contact the PCP. Virginia Franklin apologized and said she thought she had called the PCP.  She was appreciative for call back.

## 2019-04-08 NOTE — Telephone Encounter (Signed)
Received a voicemail from [place] who stated seh drew the pt's blood today and the lab can't process it cause they removed the pt label before the next person was able to process the blood so she has to get a POC machine to do an INR and she is unsure if she can get it by 5pm and it's raining. Called her back and spoke with [facility], advised that since she is not able to get the INR by 5pm to get it tomorrow and call to [place] at [phone]; she stated she was sorry and that she will be sure to obtain tomorrow morning. Gave her a verbal order to obtain a POC INR tomorrow. She is aware we open later tomorrow and that if she calls to leave a msg if we don't answer and she verbalized understanding.

## 2019-04-08 NOTE — Telephone Encounter (Signed)
New message   Per Lujean Rave should she still be antibiotic? The patient is having side effects such as thrush in mouth. Please call to discuss.

## 2019-04-08 NOTE — Telephone Encounter (Signed)
Copied from [place] [id number]. Topic: General - Inquiry >> Apr 08, 2019  4:24 PM Mathis Bud wrote: Reason for CRM: Lujean Rave called from [facility] stating she spoke with patient daughter.  Daughter stated patient is on an antibiotics and is experiencing thurst tongue.  Patients medication chart does not show her on any antibiotics. Patient is experiencing a white tongue. Lujean Rave from [facility] would like a call back from PCP or nurse. Call back # [phone]

## 2019-04-09 ENCOUNTER — Ambulatory Visit (INDEPENDENT_AMBULATORY_CARE_PROVIDER_SITE_OTHER): Payer: Medicare HMO | Admitting: Interventional Cardiology

## 2019-04-09 DIAGNOSIS — A419 Sepsis, unspecified organism: Secondary | ICD-10-CM | POA: Diagnosis not present

## 2019-04-09 DIAGNOSIS — G309 Alzheimer's disease, unspecified: Secondary | ICD-10-CM | POA: Diagnosis not present

## 2019-04-09 DIAGNOSIS — M069 Rheumatoid arthritis, unspecified: Secondary | ICD-10-CM | POA: Diagnosis not present

## 2019-04-09 DIAGNOSIS — I10 Essential (primary) hypertension: Secondary | ICD-10-CM | POA: Diagnosis not present

## 2019-04-09 DIAGNOSIS — E785 Hyperlipidemia, unspecified: Secondary | ICD-10-CM | POA: Diagnosis not present

## 2019-04-09 DIAGNOSIS — R69 Illness, unspecified: Secondary | ICD-10-CM | POA: Diagnosis not present

## 2019-04-09 DIAGNOSIS — M810 Age-related osteoporosis without current pathological fracture: Secondary | ICD-10-CM | POA: Diagnosis not present

## 2019-04-09 DIAGNOSIS — Z952 Presence of prosthetic heart valve: Secondary | ICD-10-CM | POA: Diagnosis not present

## 2019-04-09 DIAGNOSIS — I4891 Unspecified atrial fibrillation: Secondary | ICD-10-CM | POA: Diagnosis not present

## 2019-04-09 DIAGNOSIS — Z7901 Long term (current) use of anticoagulants: Secondary | ICD-10-CM | POA: Diagnosis not present

## 2019-04-09 DIAGNOSIS — K5732 Diverticulitis of large intestine without perforation or abscess without bleeding: Secondary | ICD-10-CM | POA: Diagnosis not present

## 2019-04-09 DIAGNOSIS — Z5181 Encounter for therapeutic drug level monitoring: Secondary | ICD-10-CM | POA: Diagnosis not present

## 2019-04-09 LAB — POCT INR: INR: 2.1 (ref 2.0–3.0)

## 2019-04-09 MED ORDER — NYSTATIN 100000 UNIT/ML MT SUSP: 5.0000 mL | Freq: Four times a day (QID) | OROMUCOSAL | 0 refills | Status: DC

## 2019-04-09 NOTE — Telephone Encounter (Signed)
It sounds like thrush to me. Call in Nystatin oral suspension, to swish 5 ml in the mouth QID until clear, 200 ml

## 2019-04-09 NOTE — Telephone Encounter (Signed)
meds have been sent to the pharmacy 

## 2019-04-09 NOTE — Telephone Encounter (Signed)
Virginia Franklin called back and stated she is now having soreness to the tongue.

## 2019-04-09 NOTE — Patient Instructions (Addendum)
Description   Spoke with Lujean Rave from [facility] for patient to take 1.5 tabs today and the continue to take 1 tablet daily except for half a tablet on Monday and Fridays. Recheck INR in 1 week.

## 2019-04-09 NOTE — Telephone Encounter (Signed)
Dr. Sarajane Jews please advise.  Pt is not able to do a virtual visit.  Thanks

## 2019-04-14 DIAGNOSIS — M25562 Pain in left knee: Secondary | ICD-10-CM | POA: Diagnosis not present

## 2019-04-14 DIAGNOSIS — G309 Alzheimer's disease, unspecified: Secondary | ICD-10-CM | POA: Diagnosis not present

## 2019-04-14 DIAGNOSIS — M069 Rheumatoid arthritis, unspecified: Secondary | ICD-10-CM | POA: Diagnosis not present

## 2019-04-14 DIAGNOSIS — K5732 Diverticulitis of large intestine without perforation or abscess without bleeding: Secondary | ICD-10-CM | POA: Diagnosis not present

## 2019-04-14 DIAGNOSIS — M810 Age-related osteoporosis without current pathological fracture: Secondary | ICD-10-CM | POA: Diagnosis not present

## 2019-04-14 DIAGNOSIS — I10 Essential (primary) hypertension: Secondary | ICD-10-CM | POA: Diagnosis not present

## 2019-04-14 DIAGNOSIS — M25561 Pain in right knee: Secondary | ICD-10-CM | POA: Diagnosis not present

## 2019-04-14 DIAGNOSIS — E785 Hyperlipidemia, unspecified: Secondary | ICD-10-CM | POA: Diagnosis not present

## 2019-04-14 DIAGNOSIS — M6281 Muscle weakness (generalized): Secondary | ICD-10-CM | POA: Diagnosis not present

## 2019-04-14 DIAGNOSIS — R2681 Unsteadiness on feet: Secondary | ICD-10-CM | POA: Diagnosis not present

## 2019-04-14 DIAGNOSIS — A419 Sepsis, unspecified organism: Secondary | ICD-10-CM | POA: Diagnosis not present

## 2019-04-14 DIAGNOSIS — I4891 Unspecified atrial fibrillation: Secondary | ICD-10-CM | POA: Diagnosis not present

## 2019-04-14 DIAGNOSIS — R69 Illness, unspecified: Secondary | ICD-10-CM | POA: Diagnosis not present

## 2019-04-16 ENCOUNTER — Ambulatory Visit (INDEPENDENT_AMBULATORY_CARE_PROVIDER_SITE_OTHER): Payer: Medicare HMO

## 2019-04-16 DIAGNOSIS — Z5181 Encounter for therapeutic drug level monitoring: Secondary | ICD-10-CM

## 2019-04-16 DIAGNOSIS — M25562 Pain in left knee: Secondary | ICD-10-CM | POA: Diagnosis not present

## 2019-04-16 DIAGNOSIS — G309 Alzheimer's disease, unspecified: Secondary | ICD-10-CM | POA: Diagnosis not present

## 2019-04-16 DIAGNOSIS — E785 Hyperlipidemia, unspecified: Secondary | ICD-10-CM | POA: Diagnosis not present

## 2019-04-16 DIAGNOSIS — Z952 Presence of prosthetic heart valve: Secondary | ICD-10-CM | POA: Diagnosis not present

## 2019-04-16 DIAGNOSIS — Z7901 Long term (current) use of anticoagulants: Secondary | ICD-10-CM

## 2019-04-16 DIAGNOSIS — K5732 Diverticulitis of large intestine without perforation or abscess without bleeding: Secondary | ICD-10-CM | POA: Diagnosis not present

## 2019-04-16 DIAGNOSIS — I4891 Unspecified atrial fibrillation: Secondary | ICD-10-CM | POA: Diagnosis not present

## 2019-04-16 DIAGNOSIS — I10 Essential (primary) hypertension: Secondary | ICD-10-CM | POA: Diagnosis not present

## 2019-04-16 DIAGNOSIS — M069 Rheumatoid arthritis, unspecified: Secondary | ICD-10-CM | POA: Diagnosis not present

## 2019-04-16 DIAGNOSIS — M25561 Pain in right knee: Secondary | ICD-10-CM | POA: Diagnosis not present

## 2019-04-16 DIAGNOSIS — R2681 Unsteadiness on feet: Secondary | ICD-10-CM | POA: Diagnosis not present

## 2019-04-16 DIAGNOSIS — R69 Illness, unspecified: Secondary | ICD-10-CM | POA: Diagnosis not present

## 2019-04-16 DIAGNOSIS — M6281 Muscle weakness (generalized): Secondary | ICD-10-CM | POA: Diagnosis not present

## 2019-04-16 DIAGNOSIS — A419 Sepsis, unspecified organism: Secondary | ICD-10-CM | POA: Diagnosis not present

## 2019-04-16 DIAGNOSIS — M810 Age-related osteoporosis without current pathological fracture: Secondary | ICD-10-CM | POA: Diagnosis not present

## 2019-04-16 LAB — POCT INR: INR: 2.9 (ref 2.0–3.0)

## 2019-04-16 NOTE — Patient Instructions (Signed)
Description   Spoke with Lujean Rave from [facility] while in pt's home, advised to have pt continue on same dosage 1 tablet daily except 1/2 tablet on Mondays and Fridays. Recheck INR in 1 week.

## 2019-04-17 ENCOUNTER — Telehealth: Payer: Self-pay | Admitting: Interventional Cardiology

## 2019-04-17 NOTE — Telephone Encounter (Signed)
We received a call from Wyvonne Lenz from [facility] in regards to caring for the patient.  There appears to be a conflict because [facility] is already caring for the patient, and unless [facility] releases her, there is nothing [facility] can do.  Please call Monday to clarify.

## 2019-04-17 NOTE — Telephone Encounter (Signed)
New Message    Sondra From [facility] states she's not able to assist patient because she is open to [facility] and she can't be there while another agency is there.  Would like the doctor to know so the issue can be fixed since Dr. Tamala Julian ordered for [facility] for patient to be open to. Please give her a call back since the patient can't have two services taking care of her.

## 2019-04-20 DIAGNOSIS — M25562 Pain in left knee: Secondary | ICD-10-CM | POA: Diagnosis not present

## 2019-04-20 DIAGNOSIS — R2681 Unsteadiness on feet: Secondary | ICD-10-CM | POA: Diagnosis not present

## 2019-04-20 DIAGNOSIS — M25561 Pain in right knee: Secondary | ICD-10-CM | POA: Diagnosis not present

## 2019-04-20 DIAGNOSIS — M6281 Muscle weakness (generalized): Secondary | ICD-10-CM | POA: Diagnosis not present

## 2019-04-20 NOTE — Telephone Encounter (Signed)
Left message on [facility] confirmed VM that pt was referred to their services after a hospitalization that was not cardiac related.  Advised we would not be the ones to sign the orders.  Advised her to reach out to PCP and to call back if any questions.

## 2019-04-22 ENCOUNTER — Telehealth: Payer: Self-pay

## 2019-04-22 NOTE — Telephone Encounter (Signed)

## 2019-04-23 ENCOUNTER — Other Ambulatory Visit: Payer: Self-pay | Admitting: Family Medicine

## 2019-04-23 ENCOUNTER — Telehealth: Payer: Self-pay | Admitting: Family Medicine

## 2019-04-23 DIAGNOSIS — M25561 Pain in right knee: Secondary | ICD-10-CM | POA: Diagnosis not present

## 2019-04-23 DIAGNOSIS — R2681 Unsteadiness on feet: Secondary | ICD-10-CM | POA: Diagnosis not present

## 2019-04-23 DIAGNOSIS — M25562 Pain in left knee: Secondary | ICD-10-CM | POA: Diagnosis not present

## 2019-04-23 DIAGNOSIS — M6281 Muscle weakness (generalized): Secondary | ICD-10-CM | POA: Diagnosis not present

## 2019-04-23 NOTE — Telephone Encounter (Signed)
Copied from [place] [id number]. Topic: Quick Communication - Rx Refill/Question >> Apr 23, 2019 11:07 AM Celene Kras A wrote: Medication: ezetimibe-simvastatin (VYTORIN) 10-40 MG tablet  Has the patient contacted their pharmacy? Yes.  Izora Gala, from [facility], called stating pharmacy has been trying to get this medication filled for pt for a while. Please advise.  (Agent: If no, request that the patient contact the pharmacy for the refill.) (Agent: If yes, when and what did the pharmacy advise?)  Preferred Pharmacy (with phone number or street name): [place], [place] [address] Phone: [phone] Fax: [phone] Not a 24 hour pharmacy; exact hours not known.    Agent: Please be advised that RX refills may take up to 3 business days. We ask that you follow-up with your pharmacy.

## 2019-04-24 MED ORDER — EZETIMIBE-SIMVASTATIN 10-40 MG PO TABS: 1.0000 | ORAL_TABLET | Freq: Every day | ORAL | 3 refills | Status: DC

## 2019-04-24 NOTE — Telephone Encounter (Signed)
Refill has been sent to the mail order pharmacy.

## 2019-04-28 ENCOUNTER — Ambulatory Visit (INDEPENDENT_AMBULATORY_CARE_PROVIDER_SITE_OTHER): Payer: Medicare HMO | Admitting: Pharmacist

## 2019-04-28 ENCOUNTER — Other Ambulatory Visit: Payer: Self-pay

## 2019-04-28 DIAGNOSIS — Z5181 Encounter for therapeutic drug level monitoring: Secondary | ICD-10-CM

## 2019-04-28 DIAGNOSIS — Z952 Presence of prosthetic heart valve: Secondary | ICD-10-CM

## 2019-04-28 DIAGNOSIS — Z7901 Long term (current) use of anticoagulants: Secondary | ICD-10-CM

## 2019-04-28 LAB — POCT INR: INR: 2 (ref 2.0–3.0)

## 2019-04-28 NOTE — Patient Instructions (Signed)
Description   Take 1.5 tablets today and tomorrow, then continue taking 1 tablet of warfarin daily except 1/2 tablet on Mondays and Fridays. Recheck INR in 1 week.

## 2019-04-30 ENCOUNTER — Telehealth: Payer: Self-pay

## 2019-04-30 NOTE — Telephone Encounter (Signed)

## 2019-04-30 NOTE — Telephone Encounter (Signed)
lmom for prescreen  

## 2019-05-07 ENCOUNTER — Other Ambulatory Visit: Payer: Self-pay

## 2019-05-07 ENCOUNTER — Ambulatory Visit (INDEPENDENT_AMBULATORY_CARE_PROVIDER_SITE_OTHER): Payer: Medicare HMO | Admitting: *Deleted

## 2019-05-07 DIAGNOSIS — Z952 Presence of prosthetic heart valve: Secondary | ICD-10-CM

## 2019-05-07 DIAGNOSIS — Z7901 Long term (current) use of anticoagulants: Secondary | ICD-10-CM

## 2019-05-07 LAB — POCT INR: INR: 2.3 (ref 2.0–3.0)

## 2019-05-07 NOTE — Patient Instructions (Signed)
Description   Take 1.5 tablets today then change dose to 1 tablet of warfarin daily except 1/2 tablet on Fridays. Recheck INR in 1-2 weeks. Call Coumadin clinic with questions [phone].

## 2019-05-11 ENCOUNTER — Telehealth: Payer: Self-pay

## 2019-05-11 NOTE — Telephone Encounter (Signed)
Unable to lmom for prescreen  

## 2019-05-18 ENCOUNTER — Ambulatory Visit (INDEPENDENT_AMBULATORY_CARE_PROVIDER_SITE_OTHER): Payer: Medicare HMO | Admitting: *Deleted

## 2019-05-18 ENCOUNTER — Other Ambulatory Visit: Payer: Self-pay

## 2019-05-18 DIAGNOSIS — Z952 Presence of prosthetic heart valve: Secondary | ICD-10-CM

## 2019-05-18 DIAGNOSIS — Z7901 Long term (current) use of anticoagulants: Secondary | ICD-10-CM

## 2019-05-18 DIAGNOSIS — Z5181 Encounter for therapeutic drug level monitoring: Secondary | ICD-10-CM

## 2019-05-18 LAB — POCT INR: INR: 3.9 — AB (ref 2.0–3.0)

## 2019-05-18 NOTE — Patient Instructions (Signed)
Description   Do not take any Warfarin today then continue taking 1 tablet of warfarin daily except 1/2 tablet on Fridays. Recheck INR in 2 weeks. Call Coumadin clinic with questions [phone]. Caswell Corwin, who fills pt's pill box and updated on pt's dosage and instructions. [phone]

## 2019-05-28 ENCOUNTER — Telehealth: Payer: Self-pay

## 2019-05-28 NOTE — Telephone Encounter (Signed)

## 2019-06-01 ENCOUNTER — Ambulatory Visit (INDEPENDENT_AMBULATORY_CARE_PROVIDER_SITE_OTHER): Payer: Medicare HMO | Admitting: *Deleted

## 2019-06-01 ENCOUNTER — Other Ambulatory Visit: Payer: Self-pay

## 2019-06-01 DIAGNOSIS — Z7901 Long term (current) use of anticoagulants: Secondary | ICD-10-CM | POA: Diagnosis not present

## 2019-06-01 DIAGNOSIS — Z952 Presence of prosthetic heart valve: Secondary | ICD-10-CM | POA: Diagnosis not present

## 2019-06-01 LAB — POCT INR: INR: 4.8 — AB (ref 2.0–3.0)

## 2019-06-01 NOTE — Patient Instructions (Signed)
Description   Hold coumadin today and tomorrow, then change dose to 1 tablet daily except for 1/2 a tablet on Tuesday and Friday. Call Coumadin clinic with questions [phone]. Caswell Corwin, who fills pt's pill box and updated on pt's dosage and instructions. [phone], recheck INR in 2 weeks.

## 2019-06-12 ENCOUNTER — Telehealth: Payer: Self-pay

## 2019-06-12 NOTE — Telephone Encounter (Signed)
Unable to lmom for prescreen constant ringing

## 2019-06-26 ENCOUNTER — Telehealth: Payer: Self-pay | Admitting: Interventional Cardiology

## 2019-06-26 ENCOUNTER — Ambulatory Visit (INDEPENDENT_AMBULATORY_CARE_PROVIDER_SITE_OTHER): Payer: Medicare HMO | Admitting: *Deleted

## 2019-06-26 ENCOUNTER — Other Ambulatory Visit: Payer: Self-pay

## 2019-06-26 DIAGNOSIS — Z7901 Long term (current) use of anticoagulants: Secondary | ICD-10-CM | POA: Diagnosis not present

## 2019-06-26 DIAGNOSIS — Z952 Presence of prosthetic heart valve: Secondary | ICD-10-CM

## 2019-06-26 LAB — POCT INR: INR: 4.3 — AB (ref 2.0–3.0)

## 2019-06-26 NOTE — Patient Instructions (Addendum)
Description   Hold coumadin today, then continue to take 1 tablet daily except for 1/2 a tablet on Tuesday and Friday. Call Coumadin clinic with questions [phone]. Recheck INR in 2 weeks.    Called Caswell Corwin, who fills pt's pill box [phone], she stated to call her supervisor and update her Arlys John at [phone]).  Called and updated Helene Kelp on pt's dosing instructions

## 2019-06-26 NOTE — Telephone Encounter (Signed)
New message:     Virginia Franklin calling needing some orders for this patient. She has faxed over order on 06/18/19 and is going to send them today.

## 2019-06-26 NOTE — Telephone Encounter (Signed)
Paperwork was received.  Spoke with Dr. Tamala Julian and he said PCP needs to sign for [facility] orders.  Pt does see our Coumadin Clinic so we can sign for INRs.  Called Anderson Malta and was sent to her VM.  Left detailed message letting her know what Dr. Tamala Julian said and to call back if any questions.

## 2019-07-07 ENCOUNTER — Telehealth: Payer: Self-pay | Admitting: Interventional Cardiology

## 2019-07-07 NOTE — Telephone Encounter (Signed)
New Message:  Caryl Pina from [facility] was calling to see if Dr. Tamala Julian would sign a Plan of Care order for the patient to have Physical Therapy.   The order was initially sent over in May, but [facility] did not receive a response. Caryl Pina will be re-faxing the order today.  If you need to contact her, the main number is given, and you will need to ask for Caryl Pina or [place]. Either will be able to answer questions regarding this patient.

## 2019-07-08 NOTE — Telephone Encounter (Signed)
Spoke with Virginia Franklin and made her aware that we can sign for INRs but all other orders would need to go to PCP.  Virginia Franklin verbalized understanding.

## 2019-07-10 ENCOUNTER — Ambulatory Visit (INDEPENDENT_AMBULATORY_CARE_PROVIDER_SITE_OTHER): Payer: Medicare HMO

## 2019-07-10 ENCOUNTER — Other Ambulatory Visit: Payer: Self-pay

## 2019-07-10 DIAGNOSIS — Z952 Presence of prosthetic heart valve: Secondary | ICD-10-CM | POA: Diagnosis not present

## 2019-07-10 DIAGNOSIS — Z5181 Encounter for therapeutic drug level monitoring: Secondary | ICD-10-CM | POA: Diagnosis not present

## 2019-07-10 DIAGNOSIS — Z7901 Long term (current) use of anticoagulants: Secondary | ICD-10-CM | POA: Diagnosis not present

## 2019-07-10 LAB — POCT INR: INR: 2.3 (ref 2.0–3.0)

## 2019-07-10 NOTE — Patient Instructions (Signed)
Description   Take an extra 1/2 tablet today, then resume same dosage 1 tablet daily except for 1/2 a tablet on Tuesdays and Fridays. Call Coumadin clinic with questions [phone].  Recheck INR in 3 weeks.    Called Caswell Corwin, who fills pt's pill box [phone], she stated to call her supervisor and update her Arlys John at [phone]).  Called and updated Helene Kelp on pt's dosing instructions

## 2019-07-28 ENCOUNTER — Other Ambulatory Visit: Payer: Self-pay

## 2019-07-28 ENCOUNTER — Ambulatory Visit: Payer: Medicare HMO | Admitting: Family Medicine

## 2019-07-31 ENCOUNTER — Ambulatory Visit (INDEPENDENT_AMBULATORY_CARE_PROVIDER_SITE_OTHER): Payer: Medicare HMO

## 2019-07-31 ENCOUNTER — Ambulatory Visit (INDEPENDENT_AMBULATORY_CARE_PROVIDER_SITE_OTHER): Payer: Medicare HMO | Admitting: Family Medicine

## 2019-07-31 ENCOUNTER — Other Ambulatory Visit: Payer: Self-pay

## 2019-07-31 ENCOUNTER — Encounter: Payer: Self-pay | Admitting: Family Medicine

## 2019-07-31 VITALS — BP 120/62 | HR 81 | Temp 98.0°F | Wt 140.2 lb

## 2019-07-31 DIAGNOSIS — R32 Unspecified urinary incontinence: Secondary | ICD-10-CM | POA: Diagnosis not present

## 2019-07-31 DIAGNOSIS — L989 Disorder of the skin and subcutaneous tissue, unspecified: Secondary | ICD-10-CM | POA: Diagnosis not present

## 2019-07-31 DIAGNOSIS — Z5181 Encounter for therapeutic drug level monitoring: Secondary | ICD-10-CM | POA: Diagnosis not present

## 2019-07-31 DIAGNOSIS — Z952 Presence of prosthetic heart valve: Secondary | ICD-10-CM

## 2019-07-31 DIAGNOSIS — Z7901 Long term (current) use of anticoagulants: Secondary | ICD-10-CM

## 2019-07-31 DIAGNOSIS — Z23 Encounter for immunization: Secondary | ICD-10-CM

## 2019-07-31 LAB — POCT INR: INR: 2.6 (ref 2.0–3.0)

## 2019-07-31 MED ORDER — MEMANTINE HCL 10 MG PO TABS: 10.0000 mg | ORAL_TABLET | Freq: Two times a day (BID) | ORAL | 3 refills | Status: DC

## 2019-07-31 MED ORDER — SOLIFENACIN SUCCINATE 5 MG PO TABS: 5.0000 mg | ORAL_TABLET | Freq: Every day | ORAL | 3 refills | Status: DC

## 2019-07-31 NOTE — Patient Instructions (Signed)
Description   Continue on same dosage 1 tablet daily except for 1/2 a tablet on Tuesdays and Fridays. Call Coumadin clinic with questions [phone].  Recheck INR in 4 weeks.    Called Caswell Corwin, who fills pt's pill box [phone], she stated to call her supervisor and update her Arlys John at [phone]).  Called and updated Helene Kelp on pt's dosing instructions

## 2019-07-31 NOTE — Progress Notes (Signed)
   Subjective:    Patient ID: Virginia Franklin, female    DOB: 01/19/1929, 85 y.o.   MRN: [id number]  HPI Here for several issues. First she has had a lesion on the top of her head for 6 months or so that gets quite tender at times. It seems to be slowly getting larger. Also for years she has had urinary incontinence, and lately it has been getting worse. She leaks a lot at night and she wears Depends. No urgency or discomfort.    Review of Systems  Constitutional: Negative.   Respiratory: Negative.   Cardiovascular: Negative.   Genitourinary: Negative for difficulty urinating, dysuria, flank pain, frequency, hematuria and urgency.       Objective:   Physical Exam Constitutional:      Appearance: Normal appearance.  Cardiovascular:     Rate and Rhythm: Normal rate and regular rhythm.     Pulses: Normal pulses.     Heart sounds: Normal heart sounds.  Pulmonary:     Effort: Pulmonary effort is normal.     Breath sounds: Normal breath sounds.  Abdominal:     General: Abdomen is flat. Bowel sounds are normal. There is no distension.     Palpations: Abdomen is soft. There is no mass.     Tenderness: There is no abdominal tenderness. There is no guarding or rebound.     Hernia: No hernia is present.  Skin:    Comments: There is a small tender papule on the vertex of the scalp   Neurological:     Mental Status: She is alert.           Assessment & Plan:  Scalp lesion, we will refer to Dermatology to remove this. For the incontinence, try Vesicare 5 mg daily.  Alysia Penna, MD

## 2019-08-05 ENCOUNTER — Telehealth: Payer: Self-pay | Admitting: Family Medicine

## 2019-08-05 NOTE — Telephone Encounter (Signed)
Copied from [place] [id number]. Topic: Referral - Status >> Aug 05, 2019 11:51 AM Reyne Dumas L wrote: Reason for CRM:   Pt would like to know what referral status is for the referral to dermatologist.     Called pt informed her the status of her referral that it was sent still awaiting to be scheduled pt states ok

## 2019-08-06 ENCOUNTER — Other Ambulatory Visit: Payer: Self-pay | Admitting: Interventional Cardiology

## 2019-08-17 DIAGNOSIS — Z03818 Encounter for observation for suspected exposure to other biological agents ruled out: Secondary | ICD-10-CM | POA: Diagnosis not present

## 2019-08-25 DIAGNOSIS — D485 Neoplasm of uncertain behavior of skin: Secondary | ICD-10-CM | POA: Diagnosis not present

## 2019-08-25 DIAGNOSIS — L989 Disorder of the skin and subcutaneous tissue, unspecified: Secondary | ICD-10-CM | POA: Diagnosis not present

## 2019-08-28 ENCOUNTER — Other Ambulatory Visit: Payer: Self-pay

## 2019-08-28 ENCOUNTER — Ambulatory Visit (INDEPENDENT_AMBULATORY_CARE_PROVIDER_SITE_OTHER): Payer: Medicare HMO | Admitting: Pharmacist

## 2019-08-28 DIAGNOSIS — Z7901 Long term (current) use of anticoagulants: Secondary | ICD-10-CM

## 2019-08-28 DIAGNOSIS — Z952 Presence of prosthetic heart valve: Secondary | ICD-10-CM

## 2019-08-28 LAB — POCT INR: INR: 4.4 — AB (ref 2.0–3.0)

## 2019-08-28 NOTE — Patient Instructions (Signed)
Description   Hold coumadin today, then continue on same dosage 1 tablet daily except for 1/2 a tablet on Tuesdays and Fridays. Call Coumadin clinic with questions [phone].  Recheck INR in 2 weeks.   Called Caswell Corwin, who fills pt's pill box [phone] and she verbalized understanding of holding coumadin today. Also called the supervisor, Neita Garnet to update her on the pt's dosing instructions however she did not answer Arlys John at [phone]).

## 2019-09-04 ENCOUNTER — Other Ambulatory Visit: Payer: Self-pay

## 2019-09-04 ENCOUNTER — Emergency Department (HOSPITAL_COMMUNITY): Payer: Medicare HMO

## 2019-09-04 ENCOUNTER — Inpatient Hospital Stay (HOSPITAL_COMMUNITY)
Admission: EM | Admit: 2019-09-04 | Discharge: 2019-09-14 | DRG: 065 | Disposition: A | Discharge status: Home Health | Payer: Medicare HMO | Attending: Neurology | Admitting: Neurology

## 2019-09-04 ENCOUNTER — Inpatient Hospital Stay (HOSPITAL_COMMUNITY): Payer: Medicare HMO

## 2019-09-04 ENCOUNTER — Encounter (HOSPITAL_COMMUNITY): Payer: Self-pay

## 2019-09-04 DIAGNOSIS — I609 Nontraumatic subarachnoid hemorrhage, unspecified: Secondary | ICD-10-CM | POA: Diagnosis not present

## 2019-09-04 DIAGNOSIS — G309 Alzheimer's disease, unspecified: Secondary | ICD-10-CM | POA: Diagnosis not present

## 2019-09-04 DIAGNOSIS — I6523 Occlusion and stenosis of bilateral carotid arteries: Secondary | ICD-10-CM | POA: Diagnosis not present

## 2019-09-04 DIAGNOSIS — M810 Age-related osteoporosis without current pathological fracture: Secondary | ICD-10-CM | POA: Diagnosis present

## 2019-09-04 DIAGNOSIS — E785 Hyperlipidemia, unspecified: Secondary | ICD-10-CM | POA: Diagnosis present

## 2019-09-04 DIAGNOSIS — Z87891 Personal history of nicotine dependence: Secondary | ICD-10-CM

## 2019-09-04 DIAGNOSIS — F0280 Dementia in other diseases classified elsewhere without behavioral disturbance: Secondary | ICD-10-CM | POA: Diagnosis present

## 2019-09-04 DIAGNOSIS — R402252 Coma scale, best verbal response, oriented, at arrival to emergency department: Secondary | ICD-10-CM | POA: Diagnosis present

## 2019-09-04 DIAGNOSIS — I629 Nontraumatic intracranial hemorrhage, unspecified: Secondary | ICD-10-CM | POA: Diagnosis not present

## 2019-09-04 DIAGNOSIS — I619 Nontraumatic intracerebral hemorrhage, unspecified: Secondary | ICD-10-CM | POA: Diagnosis not present

## 2019-09-04 DIAGNOSIS — G459 Transient cerebral ischemic attack, unspecified: Secondary | ICD-10-CM | POA: Diagnosis not present

## 2019-09-04 DIAGNOSIS — Z952 Presence of prosthetic heart valve: Secondary | ICD-10-CM

## 2019-09-04 DIAGNOSIS — Z7901 Long term (current) use of anticoagulants: Secondary | ICD-10-CM

## 2019-09-04 DIAGNOSIS — R269 Unspecified abnormalities of gait and mobility: Secondary | ICD-10-CM | POA: Diagnosis not present

## 2019-09-04 DIAGNOSIS — R4781 Slurred speech: Secondary | ICD-10-CM | POA: Diagnosis present

## 2019-09-04 DIAGNOSIS — Z880 Allergy status to penicillin: Secondary | ICD-10-CM | POA: Diagnosis not present

## 2019-09-04 DIAGNOSIS — Z20828 Contact with and (suspected) exposure to other viral communicable diseases: Secondary | ICD-10-CM | POA: Diagnosis present

## 2019-09-04 DIAGNOSIS — D689 Coagulation defect, unspecified: Secondary | ICD-10-CM | POA: Diagnosis present

## 2019-09-04 DIAGNOSIS — I618 Other nontraumatic intracerebral hemorrhage: Principal | ICD-10-CM | POA: Diagnosis present

## 2019-09-04 DIAGNOSIS — I10 Essential (primary) hypertension: Secondary | ICD-10-CM | POA: Diagnosis present

## 2019-09-04 DIAGNOSIS — I6782 Cerebral ischemia: Secondary | ICD-10-CM | POA: Diagnosis not present

## 2019-09-04 DIAGNOSIS — R4702 Dysphasia: Secondary | ICD-10-CM | POA: Diagnosis not present

## 2019-09-04 DIAGNOSIS — Z8541 Personal history of malignant neoplasm of cervix uteri: Secondary | ICD-10-CM

## 2019-09-04 DIAGNOSIS — Z781 Physical restraint status: Secondary | ICD-10-CM

## 2019-09-04 DIAGNOSIS — I119 Hypertensive heart disease without heart failure: Secondary | ICD-10-CM | POA: Diagnosis not present

## 2019-09-04 DIAGNOSIS — Z79899 Other long term (current) drug therapy: Secondary | ICD-10-CM

## 2019-09-04 DIAGNOSIS — I639 Cerebral infarction, unspecified: Secondary | ICD-10-CM | POA: Diagnosis not present

## 2019-09-04 DIAGNOSIS — R402142 Coma scale, eyes open, spontaneous, at arrival to emergency department: Secondary | ICD-10-CM | POA: Diagnosis present

## 2019-09-04 DIAGNOSIS — I69351 Hemiplegia and hemiparesis following cerebral infarction affecting right dominant side: Secondary | ICD-10-CM

## 2019-09-04 DIAGNOSIS — M069 Rheumatoid arthritis, unspecified: Secondary | ICD-10-CM | POA: Diagnosis not present

## 2019-09-04 DIAGNOSIS — I613 Nontraumatic intracerebral hemorrhage in brain stem: Secondary | ICD-10-CM | POA: Diagnosis not present

## 2019-09-04 DIAGNOSIS — I610 Nontraumatic intracerebral hemorrhage in hemisphere, subcortical: Secondary | ICD-10-CM | POA: Diagnosis not present

## 2019-09-04 DIAGNOSIS — R29702 NIHSS score 2: Secondary | ICD-10-CM | POA: Diagnosis not present

## 2019-09-04 DIAGNOSIS — Z8673 Personal history of transient ischemic attack (TIA), and cerebral infarction without residual deficits: Secondary | ICD-10-CM

## 2019-09-04 DIAGNOSIS — R531 Weakness: Secondary | ICD-10-CM | POA: Diagnosis not present

## 2019-09-04 DIAGNOSIS — R4182 Altered mental status, unspecified: Secondary | ICD-10-CM | POA: Diagnosis present

## 2019-09-04 DIAGNOSIS — R29810 Facial weakness: Secondary | ICD-10-CM | POA: Diagnosis not present

## 2019-09-04 DIAGNOSIS — R402362 Coma scale, best motor response, obeys commands, at arrival to emergency department: Secondary | ICD-10-CM | POA: Diagnosis present

## 2019-09-04 DIAGNOSIS — I6389 Other cerebral infarction: Secondary | ICD-10-CM | POA: Diagnosis not present

## 2019-09-04 LAB — PROTIME-INR
INR: 2.5 — ABNORMAL HIGH (ref 0.8–1.2)
INR: 1.1 (ref 0.8–1.2)
INR: 1 (ref 0.8–1.2)
Prothrombin Time: 27 seconds — ABNORMAL HIGH (ref 11.4–15.2)
Prothrombin Time: 13.9 seconds (ref 11.4–15.2)
Prothrombin Time: 13.3 seconds (ref 11.4–15.2)

## 2019-09-04 LAB — I-STAT CHEM 8, ED
BUN: 20 mg/dL (ref 8–23)
Calcium, Ion: 1.19 mmol/L (ref 1.15–1.40)
Chloride: 103 mmol/L (ref 98–111)
Creatinine, Ser: 0.7 mg/dL (ref 0.44–1.00)
Glucose, Bld: 102 mg/dL — ABNORMAL HIGH (ref 70–99)
HCT: 46 % (ref 36.0–46.0)
Hemoglobin: 15.6 g/dL — ABNORMAL HIGH (ref 12.0–15.0)
Potassium: 4.1 mmol/L (ref 3.5–5.1)
Sodium: 139 mmol/L (ref 135–145)
TCO2: 26 mmol/L (ref 22–32)

## 2019-09-04 LAB — DIFFERENTIAL
Abs Immature Granulocytes: 0.01 10*3/uL (ref 0.00–0.07)
Basophils Absolute: 0 10*3/uL (ref 0.0–0.1)
Basophils Relative: 0 %
Eosinophils Absolute: 0 10*3/uL (ref 0.0–0.5)
Eosinophils Relative: 1 %
Immature Granulocytes: 0 %
Lymphocytes Relative: 29 %
Lymphs Abs: 1.3 10*3/uL (ref 0.7–4.0)
Monocytes Absolute: 0.4 10*3/uL (ref 0.1–1.0)
Monocytes Relative: 9 %
Neutro Abs: 2.8 10*3/uL (ref 1.7–7.7)
Neutrophils Relative %: 61 %

## 2019-09-04 LAB — COMPREHENSIVE METABOLIC PANEL
ALT: 20 U/L (ref 0–44)
AST: 36 U/L (ref 15–41)
Albumin: 4.2 g/dL (ref 3.5–5.0)
Alkaline Phosphatase: 77 U/L (ref 38–126)
Anion gap: 10 (ref 5–15)
BUN: 15 mg/dL (ref 8–23)
CO2: 24 mmol/L (ref 22–32)
Calcium: 9.7 mg/dL (ref 8.9–10.3)
Chloride: 104 mmol/L (ref 98–111)
Creatinine, Ser: 0.81 mg/dL (ref 0.44–1.00)
GFR calc Af Amer: >60 mL/min (ref >60)
GFR calc non Af Amer: >60 mL/min (ref >60)
Glucose, Bld: 101 mg/dL — ABNORMAL HIGH (ref 70–99)
Potassium: 4.1 mmol/L (ref 3.5–5.1)
Sodium: 138 mmol/L (ref 135–145)
Total Bilirubin: 0.8 mg/dL (ref 0.3–1.2)
Total Protein: 7.5 g/dL (ref 6.5–8.1)

## 2019-09-04 LAB — SARS CORONAVIRUS 2 (TAT 6-24 HRS): SARS Coronavirus 2: NEGATIVE

## 2019-09-04 LAB — APTT: aPTT: 39 seconds — ABNORMAL HIGH (ref 24–36)

## 2019-09-04 LAB — CBC
HCT: 44.3 % (ref 36.0–46.0)
Hemoglobin: 14.7 g/dL (ref 12.0–15.0)
MCH: 30.4 pg (ref 26.0–34.0)
MCHC: 33.2 g/dL (ref 30.0–36.0)
MCV: 91.5 fL (ref 80.0–100.0)
Platelets: 206 10*3/uL (ref 150–400)
RBC: 4.84 MIL/uL (ref 3.87–5.11)
RDW: 13.4 % (ref 11.5–15.5)
WBC: 4.6 10*3/uL (ref 4.0–10.5)
nRBC: 0 % (ref 0.0–0.2)

## 2019-09-04 LAB — CBG MONITORING, ED: Glucose-Capillary: 110 mg/dL — ABNORMAL HIGH (ref 70–99)

## 2019-09-04 MED ORDER — ESCITALOPRAM OXALATE 10 MG PO TABS
5.0000 mg | ORAL_TABLET | Freq: Every day | ORAL | Status: DC
  Administered 2019-09-05 – 2019-09-14 (×10): 5 mg via ORAL
  Filled 2019-09-04 (×10): qty 1

## 2019-09-04 MED ORDER — PROTHROMBIN COMPLEX CONC HUMAN 500 UNITS IV KIT
1622.0000 [IU] | PACK | Status: AC
  Administered 2019-09-04: 1622 [IU] via INTRAVENOUS
  Filled 2019-09-04: qty 1122

## 2019-09-04 MED ORDER — VITAMIN K1 10 MG/ML IJ SOLN
10.0000 mg | INTRAVENOUS | Status: AC
  Administered 2019-09-04: 10 mg via INTRAVENOUS
  Filled 2019-09-04: qty 1

## 2019-09-04 MED ORDER — CHLORHEXIDINE GLUCONATE CLOTH 2 % EX PADS
6.0000 | MEDICATED_PAD | Freq: Every day | CUTANEOUS | Status: DC
  Administered 2019-09-04 – 2019-09-12 (×5): 6 via TOPICAL

## 2019-09-04 MED ORDER — SODIUM CHLORIDE 0.9% FLUSH: 3.0000 mL | Freq: Once | INTRAVENOUS | Status: DC

## 2019-09-04 MED ORDER — STROKE: EARLY STAGES OF RECOVERY BOOK
Freq: Once | Status: AC
  Administered 2019-09-04: 21:00:00
  Administered 2019-09-05: 10:00:00 1
  Filled 2019-09-04: qty 1

## 2019-09-04 MED ORDER — CLEVIDIPINE BUTYRATE 0.5 MG/ML IV EMUL
0.0000 mg/h | INTRAVENOUS | Status: DC
  Administered 2019-09-04: 5 mg/h via INTRAVENOUS
  Administered 2019-09-04: 6 mg/h via INTRAVENOUS
  Administered 2019-09-05: 1 mg/h via INTRAVENOUS
  Administered 2019-09-06: 7 mg/h via INTRAVENOUS
  Administered 2019-09-06: 21 mg/h via INTRAVENOUS
  Administered 2019-09-06: 3 mg/h via INTRAVENOUS
  Administered 2019-09-06: 21 mg/h via INTRAVENOUS
  Administered 2019-09-06: 16 mg/h via INTRAVENOUS
  Administered 2019-09-06: 21 mg/h via INTRAVENOUS
  Administered 2019-09-06: 7 mg/h via INTRAVENOUS
  Administered 2019-09-06: 16 mg/h via INTRAVENOUS
  Administered 2019-09-06: 12 mg/h via INTRAVENOUS
  Filled 2019-09-04 (×4): qty 50
  Filled 2019-09-04: qty 100
  Filled 2019-09-04 (×2): qty 50
  Filled 2019-09-04: qty 100
  Filled 2019-09-04: qty 50
  Filled 2019-09-04: qty 100

## 2019-09-04 MED ORDER — IOHEXOL 350 MG/ML SOLN
100.0000 mL | Freq: Once | INTRAVENOUS | Status: AC | PRN
  Administered 2019-09-04: 100 mL via INTRAVENOUS

## 2019-09-04 MED ORDER — ACETAMINOPHEN 160 MG/5ML PO SOLN: 650.0000 mg | ORAL | Status: DC | PRN

## 2019-09-04 MED ORDER — CLEVIDIPINE BUTYRATE 0.5 MG/ML IV EMUL
INTRAVENOUS | Status: AC
  Administered 2019-09-04: 13:00:00 2 mg via INTRAVENOUS
  Filled 2019-09-04: qty 50

## 2019-09-04 MED ORDER — PANTOPRAZOLE SODIUM 40 MG IV SOLR
40.0000 mg | Freq: Every day | INTRAVENOUS | Status: DC
  Administered 2019-09-04 – 2019-09-09 (×6): 40 mg via INTRAVENOUS
  Filled 2019-09-04 (×7): qty 40

## 2019-09-04 MED ORDER — ACETAMINOPHEN 325 MG PO TABS
650.0000 mg | ORAL_TABLET | ORAL | Status: DC | PRN
  Administered 2019-09-10: 10:00:00 650 mg via ORAL
  Filled 2019-09-04: qty 2

## 2019-09-04 MED ORDER — SENNOSIDES-DOCUSATE SODIUM 8.6-50 MG PO TABS
1.0000 | ORAL_TABLET | Freq: Two times a day (BID) | ORAL | Status: DC
  Administered 2019-09-04 – 2019-09-14 (×19): 1 via ORAL
  Filled 2019-09-04 (×20): qty 1

## 2019-09-04 MED ORDER — LISINOPRIL 10 MG PO TABS
10.0000 mg | ORAL_TABLET | Freq: Every day | ORAL | Status: DC
  Administered 2019-09-05 – 2019-09-06 (×2): 10 mg via ORAL
  Filled 2019-09-04 (×2): qty 1

## 2019-09-04 MED ORDER — LABETALOL HCL 5 MG/ML IV SOLN
20.0000 mg | Freq: Once | INTRAVENOUS | Status: DC
  Filled 2019-09-04: qty 4

## 2019-09-04 MED ORDER — FUROSEMIDE 20 MG PO TABS
20.0000 mg | ORAL_TABLET | Freq: Every day | ORAL | Status: DC
  Administered 2019-09-05 – 2019-09-14 (×10): 20 mg via ORAL
  Filled 2019-09-04 (×10): qty 1

## 2019-09-04 MED ORDER — DARIFENACIN HYDROBROMIDE ER 7.5 MG PO TB24
7.5000 mg | ORAL_TABLET | Freq: Every day | ORAL | Status: DC
  Administered 2019-09-04 – 2019-09-14 (×10): 7.5 mg via ORAL
  Filled 2019-09-04 (×13): qty 1

## 2019-09-04 MED ORDER — CLEVIDIPINE BUTYRATE 0.5 MG/ML IV EMUL
0.0000 mg/h | INTRAVENOUS | Status: DC
  Administered 2019-09-04: 13:00:00 2 mg via INTRAVENOUS

## 2019-09-04 MED ORDER — ACETAMINOPHEN 650 MG RE SUPP: 650.0000 mg | RECTAL | Status: DC | PRN

## 2019-09-04 NOTE — ED Notes (Signed)
Returned from CT.

## 2019-09-04 NOTE — ED Notes (Signed)
ED TO INPATIENT HANDOFF REPORT  ED Nurse Name and Phone #: William Hamburger RN [phone]  S Name/Age/Gender Virginia Franklin 85 y.o. female Room/Bed: 035C/035C  Code Status   Code Status: Full Code  Home/SNF/Other: [name] Patient oriented to: self Is this baseline? No   Triage Complete: Triage complete  Chief Complaint Code STROKE  Triage Note Pt brought to ED via [place] from [facility]. Pts daughter contacted facility and EMS after pt stated that she couldn't lift her right arm or leg while they were on the phone. Daughter reported slurred speech to EMS. EMS reports pt had right sided deficits and slurred speech upon arrival to scene. EMS reports last known well 12:30. Pt presented to ED with right arm ataxia and right leg drift. No facial drooping present. Pt evaluated by MD and transported to CT 3.   Allergies Allergies  Allergen Reactions  . Penicillins Anaphylaxis and Swelling    Has patient had a PCN reaction causing immediate rash, facial/tongue/throat swelling, SOB or lightheadedness with hypotension: Yes Has patient had a PCN reaction causing severe rash involving mucus membranes or skin necrosis: No Has patient had a PCN reaction that required hospitalization: No Has patient had a PCN reaction occurring within the last 10 years: No If all of the above answers are "NO", then may proceed with Cephalosporin use.    Level of Care/Admitting Diagnosis ED Disposition    ED Disposition Condition [facility] Area: [place] [100100]  Level of Care: ICU [6]  Covid Evaluation: Asymptomatic Screening Protocol (No Symptoms)  Diagnosis: ICH (intracerebral hemorrhage) [facility]) [id number]  Admitting Physician: Cheral Marker [place]  Attending Physician: Cheral Marker, ERIC Amey.Fanny  Estimated length of stay: 5 - 7 days  Certification:: I certify this patient will need inpatient services for at least 2 midnights  PT Class (Do Not Modify): Inpatient  [101]  PT Acc Code (Do Not Modify): Private [1]       B Medical/Surgery History Past Medical History:  Diagnosis Date  . Body mass index (BMI) of 22.0-22.9 in adult   . Cervical cancer ([place])   . H/O cervical fracture   . H/O osteoporosis   . H/O rheumatoid arthritis   . H/O: CVA (cerebrovascular accident) 2012  . H/O: osteoarthritis   . History of urinary frequency   . Hx of diverticulitis of colon   . Hypertension    Past Surgical History:  Procedure Laterality Date  . ABDOMINAL HYSTERECTOMY    . CARDIAC SURGERY    . CATARACT EXTRACTION    . SMALL INTESTINE SURGERY       A IV Location/Drains/Wounds Patient Lines/Drains/Airways Status   Active Line/Drains/Airways    Name:   Placement date:   Placement time:   Site:   Days:   Peripheral IV 09/04/19 Left Antecubital   09/04/19    1300    Antecubital   less than 1   Peripheral IV 09/04/19 Left Forearm   09/04/19    1300    Forearm   less than 1          Intake/Output Last 24 hours  Intake/Output Summary (Last 24 hours) at 09/04/2019 1524 Last data filed at 09/04/2019 1425 Gross per 24 hour  Intake 60 ml  Output -  Net 60 ml    Labs/Imaging Results for orders placed or performed during the hospital encounter of 09/04/19 (from the past 48 hour(s))  CBG monitoring, ED     Status: Abnormal  Collection Time: 09/04/19  1:15 PM  Result Value Ref Range   Glucose-Capillary 110 (H) 70 - 99 mg/dL  Protime-INR     Status: Abnormal   Collection Time: 09/04/19  1:20 PM  Result Value Ref Range   Prothrombin Time 27.0 (H) 11.4 - 15.2 seconds   INR 2.5 (H) 0.8 - 1.2    Comment: (NOTE) INR goal varies based on device and disease states. Performed at [facility], [place] [address] [place], [place] [address]   APTT     Status: Abnormal   Collection Time: 09/04/19  1:20 PM  Result Value Ref Range   aPTT 39 (H) 24 - 36 seconds    Comment:        IF BASELINE aPTT IS ELEVATED, SUGGEST PATIENT RISK ASSESSMENT BE  USED TO DETERMINE APPROPRIATE ANTICOAGULANT THERAPY. Performed at [facility], [place] [address], [place], [place] [address]   CBC     Status: None   Collection Time: 09/04/19  1:20 PM  Result Value Ref Range   WBC 4.6 4.0 - 10.5 K/uL   RBC 4.84 3.87 - 5.11 MIL/uL   Hemoglobin 14.7 12.0 - 15.0 g/dL   HCT 44.3 36.0 - 46.0 %   MCV 91.5 80.0 - 100.0 fL   MCH 30.4 26.0 - 34.0 pg   MCHC 33.2 30.0 - 36.0 g/dL   RDW 13.4 11.5 - 15.5 %   Platelets 206 150 - 400 K/uL   nRBC 0.0 0.0 - 0.2 %    Comment: Performed at [facility], [place] [address], [place], [place] [address]  Differential     Status: None   Collection Time: 09/04/19  1:20 PM  Result Value Ref Range   Neutrophils Relative % 61 %   Neutro Abs 2.8 1.7 - 7.7 K/uL   Lymphocytes Relative 29 %   Lymphs Abs 1.3 0.7 - 4.0 K/uL   Monocytes Relative 9 %   Monocytes Absolute 0.4 0.1 - 1.0 K/uL   Eosinophils Relative 1 %   Eosinophils Absolute 0.0 0.0 - 0.5 K/uL   Basophils Relative 0 %   Basophils Absolute 0.0 0.0 - 0.1 K/uL   Immature Granulocytes 0 %   Abs Immature Granulocytes 0.01 0.00 - 0.07 K/uL    Comment: Performed at [facility] [address], [place], [place] [address]  Comprehensive metabolic panel     Status: Abnormal   Collection Time: 09/04/19  1:20 PM  Result Value Ref Range   Sodium 138 135 - 145 mmol/L   Potassium 4.1 3.5 - 5.1 mmol/L   Chloride 104 98 - 111 mmol/L   CO2 24 22 - 32 mmol/L   Glucose, Bld 101 (H) 70 - 99 mg/dL   BUN 15 8 - 23 mg/dL   Creatinine, Ser 0.81 0.44 - 1.00 mg/dL   Calcium 9.7 8.9 - 10.3 mg/dL   Total Protein 7.5 6.5 - 8.1 g/dL   Albumin 4.2 3.5 - 5.0 g/dL   AST 36 15 - 41 U/L   ALT 20 0 - 44 U/L   Alkaline Phosphatase 77 38 - 126 U/L   Total Bilirubin 0.8 0.3 - 1.2 mg/dL   GFR calc non Af Amer >60 >60 mL/min   GFR calc Af Amer >60 >60 mL/min   Anion gap 10 5 - 15    Comment: Performed at [facility], [place] [address] [place], [place] [address]  I-stat  chem 8, ED  Status: Abnormal   Collection Time: 09/04/19  1:20 PM  Result Value Ref Range   Sodium 139 135 - 145 mmol/L   Potassium 4.1 3.5 - 5.1 mmol/L   Chloride 103 98 - 111 mmol/L   BUN 20 8 - 23 mg/dL   Creatinine, Ser 0.70 0.44 - 1.00 mg/dL   Glucose, Bld 102 (H) 70 - 99 mg/dL   Calcium, Ion 1.19 1.15 - 1.40 mmol/L   TCO2 26 22 - 32 mmol/L   Hemoglobin 15.6 (H) 12.0 - 15.0 g/dL   HCT 46.0 36.0 - 46.0 %   Ct Head Code Stroke Wo Contrast  Result Date: 09/04/2019 CLINICAL DATA:  Code stroke. Focal neuro deficit less than 6 hours. Right-sided weakness EXAM: CT HEAD WITHOUT CONTRAST TECHNIQUE: Contiguous axial images were obtained from the base of the skull through the vertex without intravenous contrast. COMPARISON:  CT head 08/07/2018 FINDINGS: Brain: Acute hemorrhage in the posterior limb internal capsule and lateral thalamus on the left. Hemorrhage measures 12 x 7 x 18 mm. Estimated blood volume 0.8 mL. No other acute hemorrhage. Moderate atrophy. Moderate chronic microvascular ischemic changes in the white matter. No acute ischemic cortical infarct. Negative for hydrocephalus. Vascular: Atherosclerotic calcification. Negative for hyperdense vessel Skull: Negative Sinuses/Orbits: Negative Other: None IMPRESSION: 1. Acute hemorrhage involving the left lateral thalamus and posterior limb internal capsule. Estimated blood volume less than 1 mL. 2. Atrophy and chronic microvascular ischemia. 3. Results texted to Dr. Cheral Marker. Electronically Signed   By: Franchot Gallo M.D.   On: 09/04/2019 13:39    Pending Labs Unresulted Labs (From admission, onward)    Start     Ordered   09/06/19 0500  Protime-INR  Daily,   R     09/04/19 1414   09/04/19 2130  Protime-INR  Now then every 6 hours,   R (with STAT occurrences)     09/04/19 1412   09/04/19 1530  Protime-INR  Once-Timed,   STAT     09/04/19 1411          Vitals/Pain Today's Vitals   09/04/19 1512 09/04/19 1515 09/04/19 1518  09/04/19 1521  BP: 139/68 (!) 149/86 133/75 135/62  Pulse: 79 69 69 68  Resp: 20 16 14 14   Temp:      TempSrc:      SpO2: 98% 97% 98% 97%  Weight:      Height:      PainSc:        Isolation Precautions No active isolations  Medications Medications  sodium chloride flush (NS) 0.9 % injection 3 mL (has no administration in time range)  clevidipine (CLEVIPREX) infusion 0.5 mg/mL (6 mg/hr Intravenous Rate/Dose Change 09/04/19 1502)   stroke: mapping our early stages of recovery book (has no administration in time range)  acetaminophen (TYLENOL) tablet 650 mg (has no administration in time range)    Or  acetaminophen (TYLENOL) 160 MG/5ML solution 650 mg (has no administration in time range)    Or  acetaminophen (TYLENOL) suppository 650 mg (has no administration in time range)  senna-docusate (Senokot-S) tablet 1 tablet (has no administration in time range)  pantoprazole (PROTONIX) injection 40 mg (has no administration in time range)  labetalol (NORMODYNE) injection 20 mg (has no administration in time range)    And  clevidipine (CLEVIPREX) infusion 0.5 mg/mL (has no administration in time range)  escitalopram (LEXAPRO) tablet 5 mg (has no administration in time range)  lisinopril (ZESTRIL) tablet 10 mg (has no administration in time range)  darifenacin (ENABLEX) 24 hr tablet 7.5 mg (has no administration in time range)  furosemide (LASIX) tablet 20 mg (has no administration in time range)  prothrombin complex conc human (KCENTRA) IVPB 1,622 Units (0 Units Intravenous Stopped 09/04/19 1425)  phytonadione (VITAMIN K) 10 mg in dextrose 5 % 50 mL IVPB (10 mg Intravenous New Bag/Given 09/04/19 1442)  clevidipine (CLEVIPREX) 0.5 MG/ML infusion (3 mg/hr  Rate/Dose Change 09/04/19 1420)    Mobility non-ambulatory High fall risk   Focused Assessments Neuro Assessment Handoff:  Swallow screen pass? Yes    NIH Stroke Scale ( + Modified Stroke Scale Criteria)  Interval: Shift  assessment Level of Consciousness (1a.)   : Alert, keenly responsive LOC Questions (1b. )   +: Answers both questions correctly LOC Commands (1c. )   + : Performs both tasks correctly Best Gaze (2. )  +: Normal Visual (3. )  +: No visual loss Facial Palsy (4. )    : Normal symmetrical movements Motor Arm, Left (5a. )   +: No drift Motor Arm, Right (5b. )   +: Drift Motor Leg, Left (6a. )   +: No drift Motor Leg, Right (6b. )   +: No drift Limb Ataxia (7. ): Present in one limb Sensory (8. )   +: Normal, no sensory loss Best Language (9. )   +: No aphasia Dysarthria (10. ): Normal Extinction/Inattention (11.)   +: No Abnormality Modified SS Total  +: 1 Complete NIHSS TOTAL: 2 Last date known well: 09/04/19 Last time known well: 1230 Neuro Assessment:   Neuro Checks:   Initial (09/04/19 1330)  Last Documented NIHSS Modified Score: 1 (09/04/19 1521) Has TPA been given? No If patient is a Neuro Trauma and patient is going to OR before floor call report to [facility] [place] nurse: [phone] or [phone]     R Recommendations: See Admitting Provider Note  Report given to:   Additional Notes:

## 2019-09-04 NOTE — ED Triage Notes (Signed)
Pt brought to ED via [place] from [facility]. Pts daughter contacted facility and EMS after pt stated that she couldn't lift her right arm or leg while they were on the phone. Daughter reported slurred speech to EMS. EMS reports pt had right sided deficits and slurred speech upon arrival to scene. EMS reports last known well 12:30. Pt presented to ED with right arm ataxia and right leg drift. No facial drooping present. Pt evaluated by MD and transported to CT 3.

## 2019-09-04 NOTE — ED Provider Notes (Signed)
[facility] EMERGENCY DEPARTMENT Provider Note   CSN: [id number] Arrival date & time: 09/04/19  1311  An emergency department physician performed an initial assessment on this suspected stroke patient at 1314.  History   Chief Complaint Chief Complaint  Patient presents with  . Code Stroke    HPI Virginia Franklin is a 85 y.o. female.     HPI Patient came in from [facility] screening.  Reportedly had difficulty raising her right arm and leg father on the phone.  Also with slurred speech.  Patient's daughter called staff.  Last normal was 1230.  Still feels of her right arm was not working right.  No headache.  No confusion.  Has aortic valve replacement with mechanical valve is and is on Coumadin. No chest pain.  No trouble breathing.  Met at bridge by neurology. Past Medical History:  Diagnosis Date  . Body mass index (BMI) of 22.0-22.9 in adult   . Cervical cancer ([place])   . H/O cervical fracture   . H/O osteoporosis   . H/O rheumatoid arthritis   . H/O: CVA (cerebrovascular accident) 2012  . H/O: osteoarthritis   . History of urinary frequency   . Hx of diverticulitis of colon   . Hypertension     Patient Active Problem List   Diagnosis Date Noted  . ICH (intracerebral hemorrhage) ([place]) 09/04/2019  . Diverticulitis 03/29/2019  . Abdominal pain 03/29/2019  . Supratherapeutic INR 03/29/2019  . Kidney mass 03/29/2019  . Hyponatremia 03/29/2019  . Nausea 03/29/2019  . Constipation 03/29/2019  . Alzheimer's dementia ([place]) 01/05/2019  . Depression with anxiety 07/25/2018  . Altered mental status 12/16/2017  . Spells of speech arrest   . HTN (hypertension) 10/15/2017  . Dyslipidemia 10/15/2017  . Hx of completed stroke 10/15/2017  . Osteoarthritis 10/15/2017  . Encounter for therapeutic drug monitoring 09/13/2017  . Chronic anticoagulation 09/05/2017  . Aortic valve replaced 09/05/2017    Past Surgical History:  Procedure Laterality Date   . ABDOMINAL HYSTERECTOMY    . CARDIAC SURGERY    . CATARACT EXTRACTION    . SMALL INTESTINE SURGERY       OB History   No obstetric history on file.      Home Medications    Prior to Admission medications   Medication Sig Start Date End Date Taking? Authorizing Provider  escitalopram (LEXAPRO) 5 MG tablet Take 1 tablet (5 mg total) by mouth daily. 01/07/19  Yes [name], MD  furosemide (LASIX) 20 MG tablet TAKE 1 TABLET DAILY Patient taking differently: Take 20 mg by mouth daily.  04/08/19  Yes [name], MD  lisinopril (ZESTRIL) 10 MG tablet TAKE 1 TABLET DAILY Patient taking differently: Take 10 mg by mouth daily.  04/07/19  Yes [name], MD  solifenacin (VESICARE) 5 MG tablet Take 1 tablet (5 mg total) by mouth daily. 07/31/19  Yes [name], MD  TURMERIC PO Take 3 capsules by mouth every morning. Turmeric with BioPerine   Yes [provider]  nystatin (MYCOSTATIN) 100000 UNIT/ML suspension Take 5 mLs (500,000 Units total) by mouth 4 (four) times daily. Patient not taking: Reported on 09/04/2019 04/09/19   [name], MD    Family History Family History  Problem Relation Age of Onset  . Kidney disease Mother        MALIGNANT NEOPLASM  . Arthritis Father   . Heart Problems Father   . Lung disease Brother   . Heart  disease Neg Hx     Social History Social History   Tobacco Use  . Smoking status: Former Research scientist (life sciences)  . Smokeless tobacco: Never Used  Substance Use Topics  . Alcohol use: Yes    Comment: SOCIALLY  . Drug use: No     Allergies   Penicillins   Review of Systems Review of Systems  Constitutional: Negative for diaphoresis.  HENT: Negative for congestion.   Respiratory: Negative for shortness of breath.   Cardiovascular: Negative for chest pain.  Gastrointestinal: Negative for abdominal pain.  Genitourinary: Negative for flank pain.  Musculoskeletal: Negative for gait problem.  Neurological: Positive for speech  difficulty, weakness and numbness. Negative for headaches.  Psychiatric/Behavioral: Negative for confusion.     Physical Exam Updated Vital Signs BP 130/71   Pulse 68   Temp 98.2 F (36.8 C) (Oral)   Resp 14   Ht '5\' 7"'$  (1.702 m)   Wt 62.7 kg   SpO2 97%   BMI 21.66 kg/m   Physical Exam Vitals signs and nursing note reviewed.  HENT:     Head: Normocephalic.  Eyes:     Extraocular Movements: Extraocular movements intact.  Neck:     Musculoskeletal: Neck supple.  Cardiovascular:     Rate and Rhythm: Regular rhythm.     Heart sounds: Murmur present.  Pulmonary:     Breath sounds: No wheezing, rhonchi or rales.  Abdominal:     Tenderness: There is no abdominal tenderness.  Musculoskeletal:     Right lower leg: No edema.     Left lower leg: No edema.  Skin:    General: Skin is warm.     Capillary Refill: Capillary refill takes less than 2 seconds.  Neurological:     General: No focal deficit present.     Mental Status: She is alert.      ED Treatments / Results  Labs (all labs ordered are listed, but only abnormal results are displayed) Labs Reviewed  PROTIME-INR - Abnormal; Notable for the following components:      Result Value   Prothrombin Time 27.0 (*)    INR 2.5 (*)    All other components within normal limits  APTT - Abnormal; Notable for the following components:   aPTT 39 (*)    All other components within normal limits  COMPREHENSIVE METABOLIC PANEL - Abnormal; Notable for the following components:   Glucose, Bld 101 (*)    All other components within normal limits  I-STAT CHEM 8, ED - Abnormal; Notable for the following components:   Glucose, Bld 102 (*)    Hemoglobin 15.6 (*)    All other components within normal limits  CBG MONITORING, ED - Abnormal; Notable for the following components:   Glucose-Capillary 110 (*)    All other components within normal limits  SARS CORONAVIRUS 2 (TAT 6-24 HRS)  CBC  DIFFERENTIAL  PROTIME-INR  PROTIME-INR   PROTIME-INR    EKG None  Radiology Ct Head Code Stroke Wo Contrast  Result Date: 09/04/2019 CLINICAL DATA:  Code stroke. Focal neuro deficit less than 6 hours. Right-sided weakness EXAM: CT HEAD WITHOUT CONTRAST TECHNIQUE: Contiguous axial images were obtained from the base of the skull through the vertex without intravenous contrast. COMPARISON:  CT head 08/07/2018 FINDINGS: Brain: Acute hemorrhage in the posterior limb internal capsule and lateral thalamus on the left. Hemorrhage measures 12 x 7 x 18 mm. Estimated blood volume 0.8 mL. No other acute hemorrhage. Moderate atrophy. Moderate chronic microvascular ischemic changes  in the white matter. No acute ischemic cortical infarct. Negative for hydrocephalus. Vascular: Atherosclerotic calcification. Negative for hyperdense vessel Skull: Negative Sinuses/Orbits: Negative Other: None IMPRESSION: 1. Acute hemorrhage involving the left lateral thalamus and posterior limb internal capsule. Estimated blood volume less than 1 mL. 2. Atrophy and chronic microvascular ischemia. 3. Results texted to Dr. Cheral Marker. Electronically Signed   By: Franchot Gallo M.D.   On: 09/04/2019 13:39    Procedures Procedures (including critical care time)  Medications Ordered in ED Medications  sodium chloride flush (NS) 0.9 % injection 3 mL (has no administration in time range)   stroke: mapping our early stages of recovery book (has no administration in time range)  acetaminophen (TYLENOL) tablet 650 mg (has no administration in time range)    Or  acetaminophen (TYLENOL) 160 MG/5ML solution 650 mg (has no administration in time range)    Or  acetaminophen (TYLENOL) suppository 650 mg (has no administration in time range)  senna-docusate (Senokot-S) tablet 1 tablet (has no administration in time range)  pantoprazole (PROTONIX) injection 40 mg (has no administration in time range)  labetalol (NORMODYNE) injection 20 mg (has no administration in time range)    And   clevidipine (CLEVIPREX) infusion 0.5 mg/mL (6 mg/hr Intravenous New Bag/Given 09/04/19 1552)  escitalopram (LEXAPRO) tablet 5 mg (has no administration in time range)  lisinopril (ZESTRIL) tablet 10 mg (has no administration in time range)  darifenacin (ENABLEX) 24 hr tablet 7.5 mg (has no administration in time range)  furosemide (LASIX) tablet 20 mg (has no administration in time range)  prothrombin complex conc human (KCENTRA) IVPB 1,622 Units (0 Units Intravenous Stopped 09/04/19 1425)  phytonadione (VITAMIN K) 10 mg in dextrose 5 % 50 mL IVPB (10 mg Intravenous New Bag/Given 09/04/19 1442)  clevidipine (CLEVIPREX) 0.5 MG/ML infusion (3 mg/hr  Rate/Dose Change 09/04/19 1420)     Initial Impression / Assessment and Plan / ED Course  I have reviewed the triage vital signs and the nursing notes.  Pertinent labs & imaging results that were available during my care of the patient were reviewed by me and considered in my medical decision making (see chart for details).       Patient came in as a code stroke.  On Coumadin for mechanical valve.  However found to have intracranial hemorrhage.  Medicine given to control blood pressure.  Emergently reversed.  Admit to ICU.  CRITICAL CARE Performed by: Davonna Belling Total critical care time: 30 minutes Critical care time was exclusive of separately billable procedures and treating other patients. Critical care was necessary to treat or prevent imminent or life-threatening deterioration. Critical care was time spent personally by me on the following activities: development of treatment plan with patient and/or surrogate as well as nursing, discussions with consultants, evaluation of patient's response to treatment, examination of patient, obtaining history from patient or surrogate, ordering and performing treatments and interventions, ordering and review of laboratory studies, ordering and review of radiographic studies, pulse oximetry and  re-evaluation of patient's condition. But now   Final Clinical Impressions(s) / ED Diagnoses   Final diagnoses:  Hemorrhagic stroke [facility])    ED Discharge Orders    None       Davonna Belling, MD 09/04/19 1606

## 2019-09-04 NOTE — Code Documentation (Signed)
Pt is 85 yr old female with history of aortic valve replacement on Coumadin coming from [facility] with c/o sudden onset of rt sided weakness of arm and leg as well as difficulty speaking. Pt was LKW at 1230 when speaking on the phone with her daughter. Shortly after the 1230 phone call, symptoms began and EMS was called. EMS activated code stroke. Pt was met at the bridge by RRN and stroke team. This RN met pt in CT 3. At that time, her assessment revealed rt arm drift and rt leg ataxia. NIHSS a 2 at that time. CT scan completed. Pt was not a candidate for TPA due to hemorrhage. Pt was taken to ED room 35 and started on Cleviprex. Goal BP <140 achieved and maintained. K Centra and Vit K were also given. Pt to be admitted and handoff given to April, RN.

## 2019-09-04 NOTE — H&P (Addendum)
Admission H&P    Chief Complaint: Acute onset of confusion, right sided numbness and difficulty ambulating.  HPI: Virginia Franklin is an 85 y.o. female independent living resident with a history of mechanical heart valve replacement 50 years ago, on Coumadin, who presents to the ED with acute onset of confusion, right sided numbness and difficulty ambulating. Symptoms began at 12:30 PM while on the telephone with her daughter. She suddenly had difficulty with speech, her RUE and RLE felt "strange" and "numb", and she had difficulty ambulating. Her daughter apparently was still on the phone with the patient, noticed that something was wrong and called the facility. Facility staff called EMS who noted difficulty with speech and right sided motor impairment on scene. She states that she does not have a headache and did not have any vision changes or neurological symptoms other than those described above.  Emergent CT head was obtained on arrival to the ED, revealing a small acute left thalamic hemorrhage.   INR was 2.5. K-Centra and Vitamin K were ordered.    Past Medical History:  Diagnosis Date  . Body mass index (BMI) of 22.0-22.9 in adult   . Cervical cancer ([place])   . H/O cervical fracture   . H/O osteoporosis   . H/O rheumatoid arthritis   . H/O: CVA (cerebrovascular accident) 2012  . H/O: osteoarthritis   . History of urinary frequency   . Hx of diverticulitis of colon   . Hypertension     Past Surgical History:  Procedure Laterality Date  . ABDOMINAL HYSTERECTOMY    . CARDIAC SURGERY    . CATARACT EXTRACTION    . SMALL INTESTINE SURGERY      Family History  Problem Relation Age of Onset  . Kidney disease Mother        MALIGNANT NEOPLASM  . Arthritis Father   . Heart Problems Father   . Lung disease Brother   . Heart disease Neg Hx    Social History:  reports that she has quit smoking. She has never used smokeless tobacco. She reports current alcohol use. She reports  that she does not use drugs.  Allergies:  Allergies  Allergen Reactions  . Penicillins Anaphylaxis and Swelling    Has patient had a PCN reaction causing immediate rash, facial/tongue/throat swelling, SOB or lightheadedness with hypotension: Yes Has patient had a PCN reaction causing severe rash involving mucus membranes or skin necrosis: No Has patient had a PCN reaction that required hospitalization: No Has patient had a PCN reaction occurring within the last 10 years: No If all of the above answers are "NO", then may proceed with Cephalosporin use.   Home Medications: No current facility-administered medications on file prior to encounter.    Current Outpatient Medications on File Prior to Encounter  Medication Sig Dispense Refill  . acetaminophen (TYLENOL) 650 MG CR tablet Take 650 mg by mouth every 8 (eight) hours as needed for pain (or headaches).     Marland Kitchen b complex vitamins tablet Take 2 tablets by mouth daily.    . Calcium Citrate-Vitamin D (CALCIUM + D PO) Take 2 tablets by mouth daily with breakfast.     . diclofenac sodium (VOLTAREN) 1 % GEL Apply 2 g topically 4 (four) times daily. 100 g 5  . escitalopram (LEXAPRO) 5 MG tablet Take 1 tablet (5 mg total) by mouth daily. 90 tablet 3  . ezetimibe-simvastatin (VYTORIN) 10-40 MG tablet Take 1 tablet by mouth daily. 90 tablet 3  . furosemide (  LASIX) 20 MG tablet TAKE 1 TABLET DAILY (Patient taking differently: Take 20 mg by mouth daily. ) 90 tablet 3  . lisinopril (ZESTRIL) 10 MG tablet TAKE 1 TABLET DAILY (Patient taking differently: Take 10 mg by mouth daily. ) 90 tablet 3  . memantine (NAMENDA) 10 MG tablet Take 1 tablet (10 mg total) by mouth 2 (two) times daily. Take 1 tablet twice a day (Patient taking differently: Take 10 mg by mouth 2 (two) times daily. ) 180 tablet 3  . Omega-3 Fatty Acids (FISH OIL) 1000 MG CAPS Take 2,000 mg by mouth every morning.    . solifenacin (VESICARE) 5 MG tablet Take 1 tablet (5 mg total) by mouth  daily. 90 tablet 3  . TURMERIC PO Take 3 capsules by mouth every morning. Turmeric with BioPerine    . warfarin (COUMADIN) 7.5 MG tablet TAKE ONE-HALF (1/2) TO ONE TABLET DAILY AS DIRECTED BY COUMADIN CLINIC (Patient taking differently: Take 3.75-7.5 mg by mouth See admin instructions. Take half tablet (3.75mg ) on TUES and FRI and one tablet (7.5mg ) daily on all other days.) 90 tablet 1  . nystatin (MYCOSTATIN) 100000 UNIT/ML suspension Take 5 mLs (500,000 Units total) by mouth 4 (four) times daily. (Patient not taking: Reported on 09/04/2019) 200 mL 0     ROS: As per HPI. Comprehensive ROS otherwise negative.   Physical Examination: Weight 62.7 kg.  HEENT-  [place]/AT  Lungs - Respirations unlabored Extremities - Warm and well perfused  Neurologic Examination: Mental Status: Alert, oriented, thought content appropriate.  Speech fluent with intact comprehension, naming and repetition  Able to follow a 3-step directional command without difficulty. Cranial Nerves: II:  Visual fields intact bilaterally. PERRL.  III,IV, VI: No ptosis. EOMI without nystagmus. V,VII: Smile symmetric, facial temp sensation equal bilaterally VIII: hearing intact to conversation IX,X: Palate elevates symmetrically XI: Symmetric shoulder shrug XII: midline tongue extension  Motor: Right : Upper extremity   5/5    Left:     Upper extremity   5/5  Lower extremity   5/5     Lower extremity   5/5 In the context of normal strength to resistance, there is bobbing of RUE when testing pronator drift.  Sensory: Decreased temp sensation to RUE and RLE. Intact FT x 4. No extinction.  Deep Tendon Reflexes:  Bilateral brachioradialis and biceps 2+ Low amplitude patellars with subtle crossed adductor responses (4+ bilaterally) Plantars: Right: downgoing   Left: downgoing Cerebellar: Mild ataxia with FNF and H-S on the right. Normal on the left.  Gait: Deferred   Results for orders placed or performed during the hospital  encounter of 09/04/19 (from the past 48 hour(s))  CBG monitoring, ED     Status: Abnormal   Collection Time: 09/04/19  1:15 PM  Result Value Ref Range   Glucose-Capillary 110 (H) 70 - 99 mg/dL  I-stat chem 8, ED     Status: Abnormal   Collection Time: 09/04/19  1:20 PM  Result Value Ref Range   Sodium 139 135 - 145 mmol/L   Potassium 4.1 3.5 - 5.1 mmol/L   Chloride 103 98 - 111 mmol/L   BUN 20 8 - 23 mg/dL   Creatinine, Ser 0.70 0.44 - 1.00 mg/dL   Glucose, Bld 102 (H) 70 - 99 mg/dL   Calcium, Ion 1.19 1.15 - 1.40 mmol/L   TCO2 26 22 - 32 mmol/L   Hemoglobin 15.6 (H) 12.0 - 15.0 g/dL   HCT 46.0 36.0 - 46.0 %  Ct Head Code Stroke Wo Contrast  Result Date: 09/04/2019 CLINICAL DATA:  Code stroke. Focal neuro deficit less than 6 hours. Right-sided weakness EXAM: CT HEAD WITHOUT CONTRAST TECHNIQUE: Contiguous axial images were obtained from the base of the skull through the vertex without intravenous contrast. COMPARISON:  CT head 08/07/2018 FINDINGS: Brain: Acute hemorrhage in the posterior limb internal capsule and lateral thalamus on the left. Hemorrhage measures 12 x 7 x 18 mm. Estimated blood volume 0.8 mL. No other acute hemorrhage. Moderate atrophy. Moderate chronic microvascular ischemic changes in the white matter. No acute ischemic cortical infarct. Negative for hydrocephalus. Vascular: Atherosclerotic calcification. Negative for hyperdense vessel Skull: Negative Sinuses/Orbits: Negative Other: None IMPRESSION: 1. Acute hemorrhage involving the left lateral thalamus and posterior limb internal capsule. Estimated blood volume less than 1 mL. 2. Atrophy and chronic microvascular ischemia. 3. Results texted to Dr. Cheral Marker. Electronically Signed   By: Franchot Gallo M.D.   On: 09/04/2019 13:39    Assessment: 85 y.o. female with acute 1.2 cm diameter left thalamic ICH 1. Exam reveals right hemiataxia and sensory loss.  2. Has mechanical heart valve and is anticoagulated with Coumadin. INR  2.5. Anticoagulant effect is being reversed with K-centra and vitamin K.   Plan: 1. Admit to ICU under Neurology service 2. CTA of head and neck 3. No antiplatelet medications or anticoagulants for now. DVT prophylaxis with SCDs 4. TTE 5. PT consult, OT consult, Speech consult 6. Cardiac telemetry 7. Frequent neuro checks 8. Stop warfarin.  9. BP management with clevidipine drip  10. Reversal of anticoagulant effect with Kcentra and vitamin K. Appreciate pharmacist assistance.  11. Will need to restart anticoagulation in 1-2 weeks if hemorrhage is stable on repeat imaging 7-14 days out. Risk of stroke off anticoagulation is substantial with a mechanical heart valve; the risk of recurrent ICH on anticoagulation is less than risk of cardioembolic stroke off anticoagulation in this patient.      Electronically signed: Dr. Kerney Elbe 09/04/2019, 1:42 PM

## 2019-09-05 ENCOUNTER — Inpatient Hospital Stay (HOSPITAL_COMMUNITY): Payer: Medicare HMO

## 2019-09-05 LAB — PROTIME-INR
INR: 0.9 (ref 0.8–1.2)
Prothrombin Time: 12.4 seconds (ref 11.4–15.2)

## 2019-09-05 LAB — MRSA PCR SCREENING: MRSA by PCR: NEGATIVE

## 2019-09-05 MED ORDER — HYDRALAZINE HCL 20 MG/ML IJ SOLN
10.0000 mg | INTRAMUSCULAR | Status: DC | PRN
  Administered 2019-09-08: 17:00:00 10 mg via INTRAVENOUS
  Filled 2019-09-05: qty 1

## 2019-09-05 MED ORDER — LIP MEDEX EX OINT
TOPICAL_OINTMENT | CUTANEOUS | Status: DC | PRN
  Filled 2019-09-05: qty 7

## 2019-09-05 MED ORDER — LABETALOL HCL 5 MG/ML IV SOLN: 10.0000 mg | INTRAVENOUS | Status: DC | PRN

## 2019-09-05 MED ORDER — STROKE: EARLY STAGES OF RECOVERY BOOK
Status: AC
  Administered 2019-09-05: 10:00:00 1
  Filled 2019-09-05: qty 1

## 2019-09-05 NOTE — Progress Notes (Signed)
Occupational Therapy Evaluation Patient Details Name: Virginia Franklin MRN: [id number] DOB: [birth year] Today's Date: 09/05/2019    History of Present Illness Virginia Franklin is an 85 y.o. female independent living resident with a history of mechanical heart valve replacement 50 years ago, on Coumadin, who presents to the ED with acute onset of confusion, right sided numbness and difficulty ambulating. Imaging revealed small acute left thalamic hemorrhage. Other PMH including rhuematoid arthritis, osteoarthritis, osteoporosis, cervical fx, cervical cancer, and HTN.    Clinical Impression   PTA, pt lived in [facility], and was independent for BADLs. Pt currently presents with decreased balance, strength, coordination, activity tolerance, and safety awareness. Pt requires max A +2 for LB ADLs, min guard A- min A for UB ADLs, and mod A +2 for functional mobility. Pt became tearful during session, reporting that she feels different than normal and things feel "hard". Pt would benefit from OT acutely to facilitate safe dc. Due to prior level of function and support at dc, recommend dc to CIR to increase independence with ADLs.     Follow Up Recommendations  CIR;Supervision/Assistance - 24 hour    Equipment Recommendations  Other (comment)(defer to next venue)    Recommendations for Other Services PT consult;Rehab consult     Precautions / Restrictions Precautions Precautions: Fall Restrictions Weight Bearing Restrictions: No      Mobility Bed Mobility Overal bed mobility: Needs Assistance Bed Mobility: Supine to Sit     Supine to sit: Min guard;HOB elevated     General bed mobility comments: min guard A for safety and balance to scoot to EOB  Transfers Overall transfer level: Needs assistance Equipment used: Rolling walker (2 wheeled);2 person hand held assist Transfers: Sit to/from Stand Sit to Stand: Min assist;+2 physical assistance;+2  safety/equipment         General transfer comment: Pt required min +2 to power up into standing, required mod A +2 to maintain balance while standing and for functional mobility     Balance Overall balance assessment: Needs assistance Sitting-balance support: Feet supported;No upper extremity supported Sitting balance-Leahy Scale: Poor Sitting balance - Comments: R lateral lean sitting EOB Postural control: Right lateral lean Standing balance support: Bilateral upper extremity supported;No upper extremity supported;During functional activity Standing balance-Leahy Scale: Poor Standing balance comment: Pt required RW and external support to maintain standing balance. Demonstrated R lateral lean while standing, required VCs to align to midline                           ADL either performed or assessed with clinical judgement   ADL Overall ADL's : Needs assistance/impaired Eating/Feeding: Modified independent;Sitting   Grooming: Oral care;Standing;Moderate assistance Grooming Details (indicate cue type and reason): performed standing at sink, required mod A +2 to maintain standing balance. Upper Body Bathing: Minimal assistance;Sitting   Lower Body Bathing: Sit to/from stand;+2 for physical assistance;+2 for safety/equipment;Maximal assistance;Minimal assistance Lower Body Bathing Details (indicate cue type and reason): min +2 to power up to stand, max A for LB ADLs Upper Body Dressing : Minimal assistance;Sitting   Lower Body Dressing: Maximal assistance;+2 for physical assistance;+2 for safety/equipment;Sit to/from stand;Minimal assistance Lower Body Dressing Details (indicate cue type and reason): required max A to don socks sitting EOB. required min +2 to power up into standing and max A +2 to manage clothing Toilet Transfer: Moderate assistance;Ambulation;+2 for physical assistance;+2 for safety/equipment;RW(simulated to recliner)   Toileting- Clothing Manipulation and  Hygiene: Moderate assistance;+2 for physical assistance;+2 for safety/equipment;Sit to/from stand;Minimal assistance Toileting - Clothing Manipulation Details (indicate cue type and reason): min A +2 to power up, max A +2 for toilet hygiene     Functional mobility during ADLs: Moderate assistance;+2 for physical assistance;+2 for safety/equipment;Rolling walker General ADL Comments: pt required min guard A-min A for UB ADLs, and max A +2 for LB ADLS      Vision Baseline Vision/History: Wears glasses Wears Glasses: Reading only Vision Assessment?: No apparent visual deficits Additional Comments: pt denies diplopia/blurry vision     Perception     Praxis      Pertinent Vitals/Pain Pain Assessment: No/denies pain     Hand Dominance Right   Extremity/Trunk Assessment Upper Extremity Assessment Upper Extremity Assessment: Generalized weakness;RUE deficits/detail RUE Deficits / Details: girp strength 3/5, difficulty with finger to nose and thumb opposition. reports that sensation feels different in comparison to LUE RUE Sensation: decreased light touch RUE Coordination: decreased fine motor;decreased gross motor   Lower Extremity Assessment Lower Extremity Assessment: Defer to PT evaluation   Cervical / Trunk Assessment Cervical / Trunk Assessment: Kyphotic   Communication Communication Communication: Receptive difficulties;Expressive difficulties   Cognition Arousal/Alertness: Awake/alert Behavior During Therapy: WFL for tasks assessed/performed Overall Cognitive Status: Impaired/Different from baseline Area of Impairment: Attention;Memory;Safety/judgement                   Current Attention Level: Sustained Memory: Decreased short-term memory   Safety/Judgement: Decreased awareness of safety     General Comments: Pt appeared with decreased awareness of safety as seen by fatigueing quickly and LOB but reporting she can remain standing. Pt apprears internally  distracted, and demonstrated decreased attention. Pt did become tearful and frustrated, reporting she feels different that normal, and things feel hard   General Comments  VSS throughout.     Exercises     Shoulder Instructions      Home Living Family/patient expects to be discharged to:: Assisted living   Available Help at Discharge: Available 24 hours/day Type of Home: Assisted living                       Home Equipment: Shower seat - built in;Walker - 4 wheels   Additional Comments: [name]  Lives With: Alone    Prior Functioning/Environment Level of Independence: Independent with assistive device(s)        Comments: Staff help with IADLs. Pt performs ADLs. Uses Rollator as needed        OT Problem List: Decreased strength;Decreased range of motion;Decreased activity tolerance;Impaired balance (sitting and/or standing);Decreased coordination;Decreased cognition;Decreased safety awareness;Decreased knowledge of use of DME or AE;Decreased knowledge of precautions;Impaired sensation;Impaired UE functional use      OT Treatment/Interventions: Self-care/ADL training;DME and/or AE instruction;Therapeutic activities;Cognitive remediation/compensation;Patient/family education;Balance training    OT Goals(Current goals can be found in the care plan section) Acute Rehab OT Goals Patient Stated Goal: get back to normal OT Goal Formulation: With patient Time For Goal Achievement: 09/26/19 Potential to Achieve Goals: Good ADL Goals Pt Will Perform Grooming: with supervision;standing Pt Will Perform Upper Body Dressing: with supervision;sitting Pt Will Perform Lower Body Dressing: sit to/from stand;with min guard assist Pt Will Transfer to Toilet: with min guard assist;ambulating;bedside commode Pt Will Perform Toileting - Clothing Manipulation and hygiene: with min guard assist;sit to/from stand Pt Will Perform Tub/Shower Transfer: Shower transfer;with min guard  assist;ambulating;shower seat;rolling walker  OT Frequency: Min 2X/week   Barriers to D/C:  Co-evaluation PT/OT/SLP Co-Evaluation/Treatment: Yes Reason for Co-Treatment: Complexity of the patient's impairments (multi-system involvement);For patient/therapist safety PT goals addressed during session: Mobility/safety with mobility OT goals addressed during session: ADL's and self-care      AM-PAC OT "6 Clicks" Daily Activity     Outcome Measure Help from another person eating meals?: None Help from another person taking care of personal grooming?: A Lot Help from another person toileting, which includes using toliet, bedpan, or urinal?: A Lot Help from another person bathing (including washing, rinsing, drying)?: A Lot Help from another person to put on and taking off regular upper body clothing?: A Little Help from another person to put on and taking off regular lower body clothing?: A Lot 6 Click Score: 15   End of Session Equipment Utilized During Treatment: Gait belt;Rolling walker Nurse Communication: Mobility status  Activity Tolerance: Patient tolerated treatment well Patient left: in chair;with call bell/phone within reach;with chair alarm set;with nursing/sitter in room  OT Visit Diagnosis: Unsteadiness on feet (R26.81);Other abnormalities of gait and mobility (R26.89);Muscle weakness (generalized) (M62.81);Other symptoms and signs involving cognitive function;Cognitive communication deficit (R41.841) Symptoms and signs involving cognitive functions: (L thalamic hemorrhage)                Time: [id number] OT Time Calculation (min): 29 min Charges:  OT General Charges $OT Visit: 1 Visit OT Evaluation $OT Eval Moderate Complexity: [place], OT Student  Gus Rankin 09/05/2019, 12:00 PM

## 2019-09-05 NOTE — Progress Notes (Signed)
Called by RN. Patient has been getting more disoriented and agitated throughout the day without worsening focal deficits. RN concerned for patient safety and has been redirecting without much success. Will try to avoid sedatives-will try soft restraints. Will also repeat CTH to r/o worsening of ICH.  - Amie [place], MD

## 2019-09-05 NOTE — Progress Notes (Signed)
PT Cancellation Note  Patient Details Name: Virginia Franklin MRN: [id number] DOB: 06-Sep-1929   Cancelled Treatment:    Reason Eval/Treat Not Completed: Active bedrest order   Ellamae Sia, PT, DPT Acute Rehabilitation Services Pager [phone] Office [phone]    Willy Eddy 09/05/2019, 7:55 AM

## 2019-09-05 NOTE — Progress Notes (Signed)
STROKE TEAM PROGRESS NOTE   HISTORY OF PRESENT ILLNESS (per record) Virginia Franklin is an 85 y.o. female independent living resident with a history of mechanical heart valve replacement 50 years ago, on Coumadin, who presents to the ED with acute onset of confusion, right sided numbness and difficulty ambulating. Symptoms began at 12:30 PM while on the telephone with her daughter. She suddenly had difficulty with speech, her RUE and RLE felt "strange" and "numb", and she had difficulty ambulating. Her daughter apparently was still on the phone with the patient, noticed that something was wrong and called the facility. Facility staff called EMS who noted difficulty with speech and right sided motor impairment on scene. She states that she does not have a headache and did not have any vision changes or neurological symptoms other than those described above.  Emergent CT head was obtained on arrival to the ED, revealing a small acute left thalamic hemorrhage.   INR was 2.5. K-Centra and Vitamin K were ordered.    INTERVAL HISTORY No headache or major complaints.  Still feels some numbness and weakness in the right arm.    OBJECTIVE Vitals:   09/05/19 0825 09/05/19 0830 09/05/19 0833 09/05/19 0845  BP: (!) 142/79  (!) 144/63 128/62  Pulse: 86 93 81 65  Resp:  (!) 22 19 15   Temp:      TempSrc:      SpO2: 92% 100% 97% 97%  Weight:      Height:        CBC:  Recent Labs  Lab 09/04/19 1320  WBC 4.6  NEUTROABS 2.8  HGB 14.7  15.6*  HCT 44.3  46.0  MCV 91.5  PLT 99991111    Basic Metabolic Panel:  Recent Labs  Lab 09/04/19 1320  NA 138  139  K 4.1  4.1  CL 104  103  CO2 24  GLUCOSE 101*  102*  BUN 15  20  CREATININE 0.81  0.70  CALCIUM 9.7    Lipid Panel:     Component Value Date/Time   CHOL 149 01/13/2018 1219   TRIG 63.0 01/13/2018 1219   HDL 72.40 01/13/2018 1219   CHOLHDL 2 01/13/2018 1219   VLDL 12.6 01/13/2018 1219   LDLCALC 64 01/13/2018 1219    HgbA1c:  Lab Results  Component Value Date   HGBA1C 5.5 12/17/2017   Urine Drug Screen: No results found for: LABOPIA, COCAINSCRNUR, LABBENZ, AMPHETMU, THCU, LABBARB  Alcohol Level No results found for: ETH  IMAGING  Ct Angio Head W Or Wo Contrast Ct Angio Neck W Or Wo Contrast 09/04/2019 IMPRESSION:   CTA neck:  The bilateral common carotid, internal carotid and vertebral arteries are patent within the neck without significant stenosis (50% or greater). Atherosclerotic disease within the major branch vessels of the neck, most notably as follows. Mild calcified plaque at the right carotid bifurcation and within the proximal right ICA. Plaque at the origin of the non dominant right vertebral artery results in mild/moderate ostial stenosis.   CTA head:  1. No intracranial aneurysm is identified.  2. No large vessel occlusion or proximal high-grade arterial stenosis. Mild calcified plaque within the carotid artery siphons and intracranial vertebral arteries bilaterally.  3. 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 Correlate for history of chronic hypertension.   Dg Chest Port 1 View 09/04/2019 IMPRESSION:  No active cardiopulmonary disease.   Ct Head Code Stroke Wo Contrast 09/04/2019 IMPRESSION:  1. Acute hemorrhage involving the left lateral thalamus and  posterior limb internal capsule. Estimated blood volume less than 1 mL.  2. Atrophy and chronic microvascular ischemia.    Transthoracic Echocardiogram  01/10/2018 Impressions: Normal LV size with mild LV hypertrophy. EF 60-65%. Normal RV size and systolic function. St Jude mechanical aortic valve was functioning normally.  ECG - pending  EEG - not ordered   PHYSICAL EXAM Blood pressure 128/62, pulse 65, temperature 98.3 F (36.8 C), temperature source Oral, resp. rate 15, height 5\' 5"  (1.651 m), weight 60.3 kg, SpO2 97 %.  Awake, alert, fully oriented. Fluent, C/N/R- intact. Face symmetric, tongue  midline. EOMI. Mild right pronator drift, but otherwise 5/5 BUE and BLE. Decreased sensation right arm.   ASSESSMENT/PLAN [name] is a 85 y.o. female with history of mechanical heart valve replacement 50 years ago, on Coumadin, htn, RA and hx of stroke 2012 who presents to the ED with acute onset of confusion, right sided numbness, difficulty ambulating and speech difficulties. CT - small acute left thalamic hemorrhage. - INR 2.5 -> K-Centra and vitamin K.  She did not receive IV t-PA due to [place].  Acute hemorrhage involving the left lateral thalamus and posterior limb internal capsule - coumadin / hypertension.  Resultant  Right side numbness, weakness  Code Stroke CT Head - Acute hemorrhage involving the left lateral thalamus and posterior limb internal capsule. Estimated blood volume less than 1 mL. Atrophy and chronic microvascular ischemia.   CT head - pending  MRI head - not ordered  MRA head - not ordered  CTA H&N - Dolichoectasia of the anterior and posterior circulation. Correlate for history of chronic hypertension.   CT Perfusion - not ordered  Carotid Doppler - CTA neck performed - carotid dopplers not indicated.  2D Echo - 01/10/2018 - Normal LV size with mild LV hypertrophy. EF 60-65%. Normal RV size and systolic function. St Jude mechanical aortic valve was functioning normally.  [employer] Virus 2  - negative  LDL - not ordered  HgbA1c - 5.5  UDS - not ordered  VTE prophylaxis - SCDs Diet  Diet Order            Diet Heart Room service appropriate? Yes; Fluid consistency: Thin  Diet effective now              warfarin daily prior to admission, now on No antithrombotic  Ongoing aggressive stroke risk factor management  Therapy recommendations:  pending  Disposition:  Pending  Hypertension  Home BP meds: Lasix ; Zestril  Current BP meds: Lasix ; Zestril ; labetalol  Stable . SBP goal < 160 mm Hg . Long-term BP goal  normotensive  Hyperlipidemia  Home Lipid lowering medication: none  LDL - not ordered, goal < 70  Current lipid lowering medication: none  Other Stroke Risk Factors  Advanced age  Former cigarette smoker - quit  ETOH use, advised to drink no more than 1 alcoholic beverage per day.  Hx of stroke 2012  St Jude mechanical aortic valve (coumadin)  Other Active Problems  St Jude mechanical aortic valve - coumadin reversed - will need to determine when it is safe to resume anticoagulation.  Hospital day # 1  Left internal capsule, left lateral thalamic hemorrhage secondary to hypertension/coumadin.  Coumadin has been withheld for now.  BP is well controlled on Cleviprex gtt.  Will slowly increase Lisinopril dose and wean off Cleviprex.  Repeat CT tomorrow am to follow up on hemorrhage.   Rogue Jury, MS, MD To contact  Stroke Continuity provider, please refer to [email or web address] After hours, contact General Neurology

## 2019-09-05 NOTE — Progress Notes (Signed)
Transported patient to and from CT. Patient is back in room, resting.

## 2019-09-05 NOTE — Evaluation (Signed)
Physical Therapy Evaluation Patient Details Name: Virginia Franklin MRN: [id number] DOB: Jan 18, 1929 Today's Date: 09/05/2019   History of Present Illness  Virginia Franklin is an 85 y.o. female independent living resident with a history of mechanical heart valve replacement 50 years ago, on Coumadin, who presents to the ED with acute onset of confusion, right sided numbness and difficulty ambulating. Imaging revealed small acute left thalamic hemorrhage. Other PMH including rhuematoid arthritis, osteoarthritis, osteoporosis, cervical fx, cervical cancer, and HTN.   Clinical Impression  Pt admitted with above. Pt presents with decreased functional mobility secondary to ataxic gait, decreased right sided coordination, decreased cognition, and balance impairments. Ambulating 10 feet with walker and two person moderate assist. Prior to admission, pt lives at an ALF, is independent with ADL's, and ambulatory with a Rollator. Will benefit from CIR to address deficits and maximize functional independence. Suspect good progress based on PLOF and motivation.     Follow Up Recommendations CIR;Supervision/Assistance - 24 hour    Equipment Recommendations  Rolling walker with 5" wheels    Recommendations for Other Services Rehab consult     Precautions / Restrictions Precautions Precautions: Fall Restrictions Weight Bearing Restrictions: No      Mobility  Bed Mobility Overal bed mobility: Needs Assistance Bed Mobility: Supine to Sit     Supine to sit: Min guard;HOB elevated     General bed mobility comments: min guard A for safety and balance to scoot to EOB  Transfers Overall transfer level: Needs assistance Equipment used: Rolling walker (2 wheeled);2 person hand held assist Transfers: Sit to/from Stand Sit to Stand: Min assist;+2 physical assistance;+2 safety/equipment         General transfer comment: Pt required min +2 to power up into standing, required mod A +2 to  maintain balance while standing and for functional mobility   Ambulation/Gait Ambulation/Gait assistance: Mod assist;+2 physical assistance;+2 safety/equipment Gait Distance (Feet): 10 Feet Assistive device: Rolling walker (2 wheeled) Gait Pattern/deviations: Step-through pattern;Decreased stride length;Narrow base of support;Ataxic Gait velocity: decreased Gait velocity interpretation: <1.8 ft/sec, indicate of risk for recurrent falls General Gait Details: Pt with ataxic gait, demonstrating dyssynergia and uncoordinated step length, requiring two person modA for stability. Cues for wider BOS, larger step length.   Stairs            Wheelchair Mobility    Modified Rankin (Stroke Patients Only) Modified Rankin (Stroke Patients Only) Pre-Morbid Rankin Score: Moderate disability Modified Rankin: Moderately severe disability     Balance Overall balance assessment: Needs assistance Sitting-balance support: Feet supported;No upper extremity supported Sitting balance-Leahy Scale: Poor Sitting balance - Comments: R lateral lean sitting EOB Postural control: Right lateral lean Standing balance support: Bilateral upper extremity supported;No upper extremity supported;During functional activity Standing balance-Leahy Scale: Poor Standing balance comment: Pt required RW and external support to maintain standing balance. Demonstrated R lateral lean while standing, required VCs to align to midline                             Pertinent Vitals/Pain Pain Assessment: No/denies pain    Home Living Family/patient expects to be discharged to:: Assisted living   Available Help at Discharge: Available 24 hours/day Type of Home: Assisted living         Home Equipment: Shower seat - built in;Walker - 4 wheels Additional Comments: [name]    Prior Function Level of Independence: Independent with assistive device(s)  Comments: Staff help with IADLs. Pt  performs ADLs. Uses Rollator as needed     Hand Dominance   Dominant Hand: Right    Extremity/Trunk Assessment   Upper Extremity Assessment Upper Extremity Assessment: RUE deficits/detail RUE Deficits / Details: grip strength 3/5, difficulty with finger to nose and thumb opposition. reports that sensation feels different in comparison to LUE RUE Sensation: decreased light touch RUE Coordination: decreased fine motor;decreased gross motor    Lower Extremity Assessment Lower Extremity Assessment: RLE deficits/detail RLE Deficits / Details: Strength WFL RLE Coordination: decreased fine motor;decreased gross motor    Cervical / Trunk Assessment Cervical / Trunk Assessment: Kyphotic  Communication   Communication: Receptive difficulties;Expressive difficulties  Cognition Arousal/Alertness: Awake/alert Behavior During Therapy: WFL for tasks assessed/performed Overall Cognitive Status: Impaired/Different from baseline Area of Impairment: Attention;Memory;Safety/judgement                   Current Attention Level: Sustained Memory: Decreased short-term memory   Safety/Judgement: Decreased awareness of safety;Decreased awareness of deficits     General Comments: Pt appeared with decreased awareness of safety as seen by fatiguing quickly and LOB but reporting she can remain standing. Pt apprears internally distracted, and demonstrated decreased attention. Pt did become tearful and frustrated, reporting she feels different that normal, and things feel hard      General Comments General comments (skin integrity, edema, etc.): VSS throughout.     Exercises     Assessment/Plan    PT Assessment Patient needs continued PT services  PT Problem List Decreased strength;Decreased activity tolerance;Decreased balance;Decreased mobility;Decreased coordination;Decreased cognition;Decreased safety awareness       PT Treatment Interventions DME instruction;Gait training;Functional  mobility training;Therapeutic activities;Therapeutic exercise;Balance training;Patient/family education    PT Goals (Current goals can be found in the Care Plan section)  Acute Rehab PT Goals Patient Stated Goal: get back to normal PT Goal Formulation: With patient Time For Goal Achievement: 09/19/19 Potential to Achieve Goals: Good    Frequency Min 4X/week   Barriers to discharge        Co-evaluation PT/OT/SLP Co-Evaluation/Treatment: Yes Reason for Co-Treatment: Complexity of the patient's impairments (multi-system involvement);For patient/therapist safety;To address functional/ADL transfers PT goals addressed during session: Mobility/safety with mobility OT goals addressed during session: ADL's and self-care       AM-PAC PT "6 Clicks" Mobility  Outcome Measure Help needed turning from your back to your side while in a flat bed without using bedrails?: A Little Help needed moving from lying on your back to sitting on the side of a flat bed without using bedrails?: A Little Help needed moving to and from a bed to a chair (including a wheelchair)?: A Lot Help needed standing up from a chair using your arms (e.g., wheelchair or bedside chair)?: A Little Help needed to walk in hospital room?: A Lot Help needed climbing 3-5 steps with a railing? : Total 6 Click Score: 14    End of Session Equipment Utilized During Treatment: Gait belt Activity Tolerance: Patient tolerated treatment well Patient left: in chair;with call bell/phone within reach;with chair alarm set Nurse Communication: Mobility status PT Visit Diagnosis: Ataxic gait (R26.0);Unsteadiness on feet (R26.81);Other symptoms and signs involving the nervous system [id number])    Time: [id number] PT Time Calculation (min) (ACUTE ONLY): 31 min   Charges:   PT Evaluation $PT Eval Moderate Complexity: [place], [place], DPT Acute Rehabilitation Services Pager [phone] Office  [phone]   [place]  Fabiola Backer 09/05/2019, 12:12 PM

## 2019-09-05 NOTE — Progress Notes (Signed)
Rehab Admissions Coordinator Note:  Per PT recommendation, this patient was screened by Raechel Ache for appropriateness for an Inpatient Acute Rehab Consult.  At this time, we are recommending Inpatient Rehab consult. AC will contact MD to request order.   Raechel Ache 09/05/2019, 12:20 PM  I can be reached at [phone].

## 2019-09-05 NOTE — Evaluation (Signed)
Speech Language Pathology Evaluation Patient Details Name: Virginia Franklin MRN: [id number] DOB: October 24, 1929 Today's Date: 09/05/2019 Time: [id number] SLP Time Calculation (min) (ACUTE ONLY): 26 min  Problem List:  Patient Active Problem List   Diagnosis Date Noted  . ICH (intracerebral hemorrhage) ([place]) 09/04/2019  . Diverticulitis 03/29/2019  . Abdominal pain 03/29/2019  . Supratherapeutic INR 03/29/2019  . Kidney mass 03/29/2019  . Hyponatremia 03/29/2019  . Nausea 03/29/2019  . Constipation 03/29/2019  . Alzheimer's dementia ([place]) 01/05/2019  . Depression with anxiety 07/25/2018  . Altered mental status 12/16/2017  . Spells of speech arrest   . HTN (hypertension) 10/15/2017  . Dyslipidemia 10/15/2017  . Hx of completed stroke 10/15/2017  . Osteoarthritis 10/15/2017  . Encounter for therapeutic drug monitoring 09/13/2017  . Chronic anticoagulation 09/05/2017  . Aortic valve replaced 09/05/2017   Past Medical History:  Past Medical History:  Diagnosis Date  . Body mass index (BMI) of 22.0-22.9 in adult   . Cervical cancer ([place])   . H/O cervical fracture   . H/O osteoporosis   . H/O rheumatoid arthritis   . H/O: CVA (cerebrovascular accident) 2012  . H/O: osteoarthritis   . History of urinary frequency   . Hx of diverticulitis of colon   . Hypertension    Past Surgical History:  Past Surgical History:  Procedure Laterality Date  . ABDOMINAL HYSTERECTOMY    . CARDIAC SURGERY    . CATARACT EXTRACTION    . SMALL INTESTINE SURGERY     HPI:  85 y.o. female independent living resident with a history of mechanical heart valve replacement 50 years ago, on Coumadin, who presents to the ED on 09/04/19 with acute onset of confusion, right sided numbness and difficulty ambulating. Symptoms began at 12:30 PM while on the telephone with her daughter. She suddenly had difficulty with speech, her RUE and RLE felt "strange" and "numb", and she had difficulty ambulating. Her  daughter apparently was still on the phone with the patient, noticed that something was wrong and called the facility. Facility staff called EMS who noted difficulty with speech and right sided motor impairment on scene. CT head on 09/04/19 indicated Acute hemorrhage involving the left lateral thalamus and posterior limb internal capsule. Estimated blood volume less than 1 mL. 2. Atrophy and chronic microvascular ischemia  Assessment / Plan / Recommendation Clinical Impression  Pt administered portion of MOCA (Montreal Cognitive Assessment) and score obtained of 15/25 with executive functioning portion eliminated d/t right hand being affected for visuospatial tasks; pt exhibits deficits with memory/recall, repetition with language tasks and attention with calculations; oriented x4;pt appears aware of deficits at this time as she stated her right hand is affected when she uses it for ADLs and her memory is also affected and she was told she has "early Alzheimer's" when asked about current difficulties after CVA. Slight right oral weakness noted at rest, but functional for speech/swallowing.  Pt 95% intelligible within simple-complex conversation despite [place] accent; ST may be initiated at next venue of care for cognitive deficits/utilizing compensatory strategies to preserve current skills if family in agreement.  Thank you for this consult.    SLP Assessment  SLP Recommendation/Assessment: All further Speech Language Pathology  needs can be addressed in the next venue of care SLP Visit Diagnosis: Cognitive communication deficit (R41.841)    Follow Up Recommendations  Other (comment)(TBD)    Frequency and Duration   Evaluation only        SLP Evaluation Cognition  Overall Cognitive  Status: Impaired/Different from baseline Arousal/Alertness: Awake/alert Orientation Level: Oriented X4 Memory: Impaired Memory Impairment: Retrieval deficit;Decreased short term memory;Decreased recall of new  information Decreased Short Term Memory: Verbal basic;Functional basic Memory Recall Sock: With Cue Memory Recall Blue: With Cue Memory Recall Bed: With Cue Awareness: Appears intact Behaviors: Perseveration Safety/Judgment: Appears intact       Comprehension  Auditory Comprehension Overall Auditory Comprehension: Appears within functional limits for tasks assessed Yes/No Questions: Within Functional Limits Commands: Within Functional Limits Conversation: Complex Interfering Components: Working Field seismologist: Pausing;Repetition Retail banker: Not tested Reading Comprehension Reading Status: Not tested    Expression Expression Primary Mode of Expression: Verbal Verbal Expression Overall Verbal Expression: Appears within functional limits for tasks assessed Initiation: No impairment Level of Generative/Spontaneous Verbalization: Conversation Repetition: Impaired Level of Impairment: Sentence level Naming: No impairment Pragmatics: No impairment Non-Verbal Means of Communication: Not applicable Written Expression Dominant Hand: Right Written Expression: Unable to assess (comment)(Dominant hand affected)   Oral / Motor  Oral Motor/Sensory Function Overall Oral Motor/Sensory Function: Other (comment)(R weakness at rest only; functional for speech/swallowing) Motor Speech Overall Motor Speech: Appears within functional limits for tasks assessed Respiration: Within functional limits Phonation: Normal Resonance: Within functional limits Articulation: Within functional limitis Intelligibility: Intelligible Motor Planning: Witnin functional limits Motor Speech Errors: Not applicable                       Elvina Sidle, M.S., CCC-SLP 09/05/2019, 9:33 AM

## 2019-09-05 NOTE — Progress Notes (Signed)
Started patient back on cleviprex drip to maintain blood pressure goal.

## 2019-09-06 MED ORDER — LORAZEPAM 2 MG/ML IJ SOLN
INTRAMUSCULAR | Status: AC
  Filled 2019-09-06: qty 1

## 2019-09-06 MED ORDER — LORAZEPAM 2 MG/ML IJ SOLN
1.0000 mg | Freq: Once | INTRAMUSCULAR | Status: AC
  Administered 2019-09-06: 1 mg via INTRAVENOUS

## 2019-09-06 MED ORDER — QUETIAPINE FUMARATE 25 MG PO TABS
12.5000 mg | ORAL_TABLET | Freq: Every day | ORAL | Status: DC
  Administered 2019-09-06 – 2019-09-13 (×9): 12.5 mg via ORAL
  Filled 2019-09-06 (×10): qty 1

## 2019-09-06 MED ORDER — LISINOPRIL 20 MG PO TABS
20.0000 mg | ORAL_TABLET | Freq: Every day | ORAL | Status: DC
  Administered 2019-09-07 – 2019-09-14 (×8): 20 mg via ORAL
  Filled 2019-09-06 (×8): qty 1

## 2019-09-06 NOTE — Progress Notes (Signed)
Patient continues to be agitated and confused and became combative; attempts to reorient patient have all failed. Patient is restrained. Will continue to monitor.

## 2019-09-06 NOTE — Progress Notes (Signed)
Patient's daughter asked this nurse to come to the room. She felt the patient had changed. Upon assessment, patient only oriented to self, but remainder of stroke assessment unchanged. Patient's daughter stated that this is how she presents with her late onset dementia. That it occurs 1-2 times/month, but happening more frequently. "It can last from a couple of hours to a couple of days" and also stated that "it seems to happen when she gets sick or stressed". Up until now, patient has been OOB to chair and orientedx4.   Notified Dr. Orlena Sheldon. MD aware and instructed to notify if there was further neuro change or if it does not resolve.

## 2019-09-06 NOTE — Progress Notes (Signed)
STROKE TEAM PROGRESS NOTE   HISTORY OF PRESENT ILLNESS (per record) Virginia Franklin is an 85 y.o. female independent living resident with a history of mechanical heart valve replacement 50 years ago, on Coumadin, who presents to the ED with acute onset of confusion, right sided numbness and difficulty ambulating. Symptoms began at 12:30 PM while on the telephone with her daughter. She suddenly had difficulty with speech, her RUE and RLE felt "strange" and "numb", and she had difficulty ambulating. Her daughter apparently was still on the phone with the patient, noticed that something was wrong and called the facility. Facility staff called EMS who noted difficulty with speech and right sided motor impairment on scene. She states that she does not have a headache and did not have any vision changes or neurological symptoms other than those described above.  Emergent CT head was obtained on arrival to the ED, revealing a small acute left thalamic hemorrhage.   INR was 2.5. K-Centra and Vitamin K were ordered.    INTERVAL HISTORY No headache or blurred vision.  She has had fluctuating disorientation and agitation since late afternoon yesterday.  Repeat CT Brain late last night did not show any changes in the left thalamic/BG bleed.  BP has fluctuated quite high sometimes since yesterday, requiring high doses of Cleviprex which is at 16 mg/hr right now.      OBJECTIVE Vitals:   09/06/19 0600 09/06/19 0615 09/06/19 0630 09/06/19 0645  BP: (!) 128/106 (!) 117/54 110/81 (!) 117/45  Pulse: 89 93 85 85  Resp: 18 15 17 16   Temp:      TempSrc:      SpO2: 98% 99% 94% 98%  Weight:      Height:        CBC:  Recent Labs  Lab 09/04/19 1320  WBC 4.6  NEUTROABS 2.8  HGB 14.7  15.6*  HCT 44.3  46.0  MCV 91.5  PLT [id number]    Basic Metabolic Panel:  Recent Labs  Lab 09/04/19 1320  NA 138  139  K 4.1  4.1  CL 104  103  CO2 24  GLUCOSE 101*  102*  BUN 15  20  CREATININE 0.81   0.70  CALCIUM 9.7    Lipid Panel:     Component Value Date/Time   CHOL 149 01/13/2018 1219   TRIG 63.0 01/13/2018 1219   HDL 72.40 01/13/2018 1219   CHOLHDL 2 01/13/2018 1219   VLDL 12.6 01/13/2018 1219   LDLCALC 64 01/13/2018 1219   HgbA1c:  Lab Results  Component Value Date   HGBA1C 5.5 12/17/2017   Urine Drug Screen: No results found for: LABOPIA, COCAINSCRNUR, LABBENZ, AMPHETMU, THCU, LABBARB  Alcohol Level No results found for: ETH  IMAGING  Ct Angio Head W Or Wo Contrast Ct Angio Neck W Or Wo Contrast 09/04/2019 IMPRESSION:   CTA neck:  The bilateral common carotid, internal carotid and vertebral arteries are patent within the neck without significant stenosis (50% or greater). Atherosclerotic disease within the major branch vessels of the neck, most notably as follows. Mild calcified plaque at the right carotid bifurcation and within the proximal right ICA. Plaque at the origin of the non dominant right vertebral artery results in mild/moderate ostial stenosis.   CTA head:  1. No intracranial aneurysm is identified.  2. No large vessel occlusion or proximal high-grade arterial stenosis. Mild calcified plaque within the carotid artery siphons and intracranial vertebral arteries bilaterally.  3. Dolichoectasia of the anterior and posterior  circulation. Correlate for history of chronic hypertension.   Dg Chest Port 1 View 09/04/2019 IMPRESSION:  No active cardiopulmonary disease.   Ct Head Code Stroke Wo Contrast 09/04/2019 IMPRESSION:  1. Acute hemorrhage involving the left lateral thalamus and posterior limb internal capsule. Estimated blood volume less than 1 mL.  2. Atrophy and chronic microvascular ischemia.   CT Head WO Contrast 09/05/2019 IMPRESSION: 1. Slight interval increase in size of an acute left thalamocapsular junction parenchymal hemorrhage, now measuring 6 x 12 x 22 mm (previously 6 x 12 x 18 mm). 2. Generalized parenchymal atrophy and chronic  small vessel ischemic disease.   Transthoracic Echocardiogram  01/10/2018 Impressions: Normal LV size with mild LV hypertrophy. EF 60-65%. Normal RV size and systolic function. St Jude mechanical aortic valve was functioning normally.  ECG - pending  EEG - not ordered   PHYSICAL EXAM Blood pressure (!) 117/45, pulse 85, temperature 98.7 F (37.1 C), temperature source Oral, resp. rate 16, height 5\' 5"  (1.651 m), weight 60.3 kg, SpO2 98 %.  Awake, alert, oriented to year, place, city, state, and president.  Disoriented to month, date, and day. Fluent, C/N/R- intact. Face symmetric, tongue midline. EOMI. Mild right pronator drift, but otherwise 5/5 BUE and BLE. Decreased sensation right arm.   ASSESSMENT/PLAN Virginia Franklin is a 85 y.o. female with history of mechanical heart valve replacement 50 years ago, on Coumadin, htn, RA and hx of stroke 2012 who presents to the ED with acute onset of confusion, right sided numbness, difficulty ambulating and speech difficulties. CT - small acute left thalamic hemorrhage. - INR 2.5 -> K-Centra and vitamin K.  She did not receive IV t-PA due to [place].  Acute hemorrhage involving the left lateral thalamus and posterior limb internal capsule - coumadin / hypertension.  Resultant  Right side numbness, weakness  Code Stroke CT Head - Acute hemorrhage involving the left lateral thalamus and posterior limb internal capsule. Estimated blood volume less than 1 mL. Atrophy and chronic microvascular ischemia.   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 Correlate for history of chronic hypertension.   CT Perfusion - not ordered  Carotid Doppler - CTA neck performed - carotid dopplers not indicated.  2D Echo - 01/10/2018 - Normal LV size with mild LV  hypertrophy. EF 60-65%. Normal RV size and systolic function. St Jude mechanical aortic valve was functioning normally.  [employer] Virus 2  - negative  LDL - not ordered  HgbA1c - 5.5  UDS - not ordered  VTE prophylaxis - SCDs Diet  Diet Order            Diet Heart Room service appropriate? Yes; Fluid consistency: Thin  Diet effective now              warfarin daily prior to admission, now on No antithrombotic  Ongoing aggressive stroke risk factor management  Therapy recommendations:  CIR recommended - inpt rehab MD consult placed  Disposition:  Pending  Hypertension  Home BP meds: Lasix ; Zestril  Current BP meds: Lasix ; Zestril ; labetalol ; Cleviprex  Stable . SBP goal < 160 mm Hg . Long-term BP goal normotensive . SBP currently running < 120 mm Hg  Hyperlipidemia  Home Lipid lowering medication: none  LDL - not ordered, goal <  70  Current lipid lowering medication: none  Other Stroke Risk Factors  Advanced age  Former cigarette smoker - quit  ETOH use, advised to drink no more than 1 alcoholic beverage per day.  Hx of stroke 2012  St Jude mechanical aortic valve (coumadin)  Other Active Problems  St Jude mechanical aortic valve - coumadin reversed - will need to determine when it is safe to resume anticoagulation.  Pt seen by Dr Rory Percy 1:30 this AM for agitation. CT reviewed. Seroquel added.   Hospital day # 2  Left internal capsule/left lateral thalamic hemorrhage secondary to hypertension/coumadin.  Coumadin has been withheld for now.  Some mental status changes intermittently, possible related to BP fluctuations.  I will increase her Lisinopril dose and slowly wean her off the Cleviprex if possible. No change in size of the bleed.  Rogue Jury, MS, MD To contact Stroke Continuity provider, please refer to [email or web address] After hours, contact General Neurology

## 2019-09-06 NOTE — Progress Notes (Signed)
Noncontrast head CT completed and reviewed Official reading with mild increase in the size of the left thalamic bleed but more or less looks stable. Patient continues to be extremely agitated even after wrist restraints. We will give a small dose of Ativan IV and Seroquel p.o. to ensure that she does not interfere with her own care. We will continue to monitor  -- Amie Portland MD

## 2019-09-07 ENCOUNTER — Inpatient Hospital Stay (HOSPITAL_COMMUNITY): Payer: Medicare HMO

## 2019-09-07 NOTE — Plan of Care (Signed)
  Problem: Education: Goal: Knowledge of disease or condition will improve Outcome: Progressing Goal: Knowledge of secondary prevention will improve Outcome: Progressing Goal: Knowledge of patient specific risk factors addressed and post discharge goals established will improve Outcome: Progressing   Problem: Coping: Goal: Will verbalize positive feelings about self Outcome: Progressing Goal: Will identify appropriate support needs Outcome: Progressing   Problem: Self-Care: Goal: Ability to participate in self-care as condition permits will improve Outcome: Progressing Goal: Ability to communicate needs accurately will improve Outcome: Progressing   Problem: Nutrition: Goal: Risk of aspiration will decrease Outcome: Progressing Goal: Dietary intake will improve Outcome: Progressing   Problem: Intracerebral Hemorrhage Tissue Perfusion: Goal: Complications of Intracerebral Hemorrhage will be minimized Outcome: Progressing

## 2019-09-07 NOTE — Procedures (Signed)
Echo attempted. Patient receiving care with NT. Will attempt again.

## 2019-09-07 NOTE — Progress Notes (Signed)
STROKE TEAM PROGRESS NOTE   HISTORY OF PRESENT ILLNESS (per record) Virginia Franklin is an 85 y.o. female independent living resident with a history of mechanical heart valve replacement 50 years ago, on Coumadin, who presents to the ED with acute onset of confusion, right sided numbness and difficulty ambulating. Symptoms began at 12:30 PM while on the telephone with her daughter. She suddenly had difficulty with speech, her RUE and RLE felt "strange" and "numb", and she had difficulty ambulating. Her daughter apparently was still on the phone with the patient, noticed that something was wrong and called the facility. Facility staff called EMS who noted difficulty with speech and right sided motor impairment on scene. She states that she does not have a headache and did not have any vision changes or neurological symptoms other than those described above.  Emergent CT head was obtained on arrival to the ED, revealing a small acute left thalamic hemorrhage.   INR was 2.5. K-Centra and Vitamin K were ordered.    INTERVAL HISTORY She is neurologically stable alert oriented today.  She is working with physical therapist and sitting in a bedside chair.  She wants to go home but therapists are recommending rehab.  Blood pressure adequately controlled.    OBJECTIVE Vitals:   09/07/19 1300 09/07/19 1330 09/07/19 1400 09/07/19 1430  BP: (!) 123/112 (!) 107/48 116/61 (!) 104/47  Pulse: 84 79 78 75  Resp: 17 (!) 23 (!) 25 14  Temp:      TempSrc:      SpO2: 96% 94% 95% 94%  Weight:      Height:        CBC:  Recent Labs  Lab 09/04/19 1320  WBC 4.6  NEUTROABS 2.8  HGB 14.7  15.6*  HCT 44.3  46.0  MCV 91.5  PLT 99991111    Basic Metabolic Panel:  Recent Labs  Lab 09/04/19 1320  NA 138  139  K 4.1  4.1  CL 104  103  CO2 24  GLUCOSE 101*  102*  BUN 15  20  CREATININE 0.81  0.70  CALCIUM 9.7    Lipid Panel:     Component Value Date/Time   CHOL 149 01/13/2018 1219   TRIG 63.0 01/13/2018 1219   HDL 72.40 01/13/2018 1219   CHOLHDL 2 01/13/2018 1219   VLDL 12.6 01/13/2018 1219   LDLCALC 64 01/13/2018 1219   HgbA1c:  Lab Results  Component Value Date   HGBA1C 5.5 12/17/2017   Urine Drug Screen: No results found for: LABOPIA, COCAINSCRNUR, LABBENZ, AMPHETMU, THCU, LABBARB  Alcohol Level No results found for: ETH  IMAGING  Ct Angio Head W Or Wo Contrast Ct Angio Neck W Or Wo Contrast 09/04/2019 IMPRESSION:   CTA neck:  The bilateral common carotid, internal carotid and vertebral arteries are patent within the neck without significant stenosis (50% or greater). Atherosclerotic disease within the major branch vessels of the neck, most notably as follows. Mild calcified plaque at the right carotid bifurcation and within the proximal right ICA. Plaque at the origin of the non dominant right vertebral artery results in mild/moderate ostial stenosis.   CTA head:  1. No intracranial aneurysm is identified.  2. No large vessel occlusion or proximal high-grade arterial stenosis. Mild calcified plaque within the carotid artery siphons and intracranial vertebral arteries bilaterally.  3. 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 Correlate for history of chronic hypertension.   Dg Chest Port 1 View 09/04/2019 IMPRESSION:  No active cardiopulmonary  disease.   Ct Head Code Stroke Wo Contrast 09/04/2019 IMPRESSION:  1. Acute hemorrhage involving the left lateral thalamus and posterior limb internal capsule. Estimated blood volume less than 1 mL.  2. Atrophy and chronic microvascular ischemia.   CT Head WO Contrast 09/05/2019 IMPRESSION: 1. Slight interval increase in size of an acute left thalamocapsular junction parenchymal hemorrhage, now measuring 6 x 12 x 22 mm (previously 6 x 12 x 18 mm). 2. Generalized parenchymal atrophy and chronic small vessel ischemic disease.   Transthoracic Echocardiogram  01/10/2018 Impressions: Normal LV  size with mild LV hypertrophy. EF 60-65%. Normal RV size and systolic function. St Jude mechanical aortic valve was functioning normally.  ECG - pending  EEG - not ordered   PHYSICAL EXAM Blood pressure (!) 104/47, pulse 75, temperature 97.8 F (36.6 C), temperature source Oral, resp. rate 14, height 5\' 5"  (1.651 m), weight 60.3 kg, SpO2 94 %. Frail pleasant elderly Caucasian lady not in distress. Neurological Exam : awake, alert, oriented to year, place, city, state, and president.  Disoriented to month, date, and day. Fluent, C/N/R- intact. Face symmetric, tongue midline. EOMI. Mild right pronator drift, but otherwise 5/5 BUE and BLE.  Mild weakness of right hip flexors and ankle dorsiflexors.  Impaired right knee to heel coordination. Decreased sensation right arm. Gait deferred  ASSESSMENT/PLAN [name] is a 85 y.o. female with history of mechanical heart valve replacement 50 years ago, on Coumadin, htn, RA and hx of stroke 2012 who presents to the ED with acute onset of confusion, right sided numbness, difficulty ambulating and speech difficulties. CT - small acute left thalamic hemorrhage. - INR 2.5 -> K-Centra and vitamin K.  She did not receive IV t-PA due to [place].  Acute hemorrhage involving the left lateral thalamus and posterior limb internal capsule - coumadin / hypertension.  Resultant  Right side numbness, weakness  Code Stroke CT Head - Acute hemorrhage involving the left lateral thalamus and posterior limb internal capsule. Estimated blood volume less than 1 mL. Atrophy and chronic microvascular ischemia.   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 Correlate for history of chronic hypertension.   CT Perfusion - not ordered  Carotid Doppler - CTA neck  performed - carotid dopplers not indicated.  2D Echo - 01/10/2018 - Normal LV size with mild LV hypertrophy. EF 60-65%. Normal RV size and systolic function. St Jude mechanical aortic valve was functioning normally.  [employer] Virus 2  - negative  LDL - not ordered  HgbA1c - 5.5  UDS - not ordered  VTE prophylaxis - SCDs Diet  Diet Order            Diet Heart Room service appropriate? Yes; Fluid consistency: Thin  Diet effective now              warfarin daily prior to admission, now on No antithrombotic  Ongoing aggressive stroke risk factor management  Therapy recommendations:  CIR recommended - inpt rehab MD consult placed  Disposition:  Pending  Hypertension  Home BP meds: Lasix ; Zestril  Current BP meds: Lasix ; Zestril ; labetalol ; Cleviprex  Stable . SBP goal < 160 mm Hg . Long-term BP goal normotensive . SBP currently running < 120 mm Hg  Hyperlipidemia  Home Lipid lowering medication:  none  LDL - not ordered, goal < 70  Current lipid lowering medication: none  Other Stroke Risk Factors  Advanced age  Former cigarette smoker - quit  ETOH use, advised to drink no more than 1 alcoholic beverage per day.  Hx of stroke 2012  St Jude mechanical aortic valve (coumadin)  Other Active Problems  St Jude mechanical aortic valve - coumadin reversed - will need to determine when it is safe to resume anticoagulation.  Pt seen by Dr Rory Percy 1:30 this AM for agitation. CT reviewed. Seroquel added.   Hospital day # 3 Patient is doing well neurologically.  Mobilize out of bed.  Continue ongoing therapies and likely transfer to inpatient rehab in the next few days.  Transfer to neurology floor bed today.  Continue aggressive blood pressure control with systolic blood pressure goal below 160.  No family available for discussion.  Greater than 50% time during this 35-minute visit was spent on counseling and coordination of care about her intracerebral  hemorrhage, hypertension and discussion with care team Antony Contras, MD Medical Director [place] Pager: [phone] 09/07/2019 5:10 PM   To contact Stroke Continuity provider, please refer to [email or web address] After hours, contact General Neurology

## 2019-09-07 NOTE — Progress Notes (Signed)
Physical Therapy Treatment Patient Details Name: Virginia Franklin MRN: [id number] DOB: August 31, 1929 Today's Date: 09/07/2019    History of Present Illness Virginia Franklin is an 85 y.o. female independent living resident with a history of mechanical heart valve replacement 50 years ago, on Coumadin, who presents to the ED with acute onset of confusion, right sided numbness and difficulty ambulating. Imaging revealed small acute left thalamic hemorrhage. Other PMH including rhuematoid arthritis, osteoarthritis, osteoporosis, cervical fx, cervical cancer, and HTN.     PT Comments    Patient up in chair on arrival, having just completed bath with nursing and prior to that OT session. Eager to participate. She was advancing and placing her RLE better (with cues) and walked 15 ft with +1 moderate assist (+1 follow with chair for safety). Standing balance and wt-shifting activities over RLE required up to mod assist when RLE fatigued and partially buckled.    Follow Up Recommendations  CIR;Supervision/Assistance - 24 hour     Equipment Recommendations  Other (comment)(TBA if no post-acute therapies; pt owns rollator)    Recommendations for Other Services       Precautions / Restrictions Precautions Precautions: Fall Restrictions Weight Bearing Restrictions: No    Mobility  Bed Mobility Overal bed mobility: Needs Assistance Bed Mobility: Supine to Sit     Supine to sit: Min assist;HOB elevated     General bed mobility comments: Cuing pt for sequence of bed mobility. Pt able to bring BLEs towards EOB and then reach for OT's hand to pull into sitting with Min A.  Transfers Overall transfer level: Needs assistance Equipment used: Rolling walker (2 wheeled);2 person hand held assist Transfers: Sit to/from Stand Sit to Stand: Min assist Stand pivot transfers: Mod assist;+2 safety/equipment       General transfer comment: vc for hand placement on chair not  RW  Ambulation/Gait Ambulation/Gait assistance: Mod assist;+2 safety/equipment Gait Distance (Feet): 15 Feet Assistive device: Rolling walker (2 wheeled) Gait Pattern/deviations: Step-through pattern;Decreased stride length;Narrow base of support;Ataxic;Decreased weight shift to right;Decreased dorsiflexion - right Gait velocity: decreased   General Gait Details: RLE tends to drag and then adduct (not scissor), pt able to correct wider step with repeated cues. Leg fatigued after 15 feet and foot dragging more.    Stairs             Wheelchair Mobility    Modified Rankin (Stroke Patients Only) Modified Rankin (Stroke Patients Only) Pre-Morbid Rankin Score: Moderate disability Modified Rankin: Moderately severe disability     Balance Overall balance assessment: Needs assistance Sitting-balance support: Feet supported;No upper extremity supported Sitting balance-Leahy Scale: Fair Sitting balance - Comments: Slight right lateral lean, but able to maintain sitting balance without physical A this session Postural control: Right lateral lean Standing balance support: No upper extremity supported;During functional activity Standing balance-Leahy Scale: Poor Standing balance comment: Standing balance and wt-shifting without UE support; tends to stay shifted over her LLE. With incr time in standing and wt-shifting, RLE buckled and pt assisted to sit in the chair directly behind her.                             Cognition Arousal/Alertness: Awake/alert Behavior During Therapy: WFL for tasks assessed/performed Overall Cognitive Status: Impaired/Different from baseline Area of Impairment: Attention;Memory;Safety/judgement;Problem solving;Awareness;Following commands                   Current Attention Level: Sustained Memory: Decreased short-term memory Following  Commands: Follows one step commands with increased time;Follows one step commands  inconsistently Safety/Judgement: Decreased awareness of safety;Decreased awareness of deficits Awareness: Emergent Problem Solving: Requires verbal cues;Slow processing General Comments: Pt convinced she had met me previously (this was first time meeting her) and she kept returning to asking me about my "two babies." After difficulty with balance activities and rt leg giving away, she was able to state that she should not try to get up by herself.       Exercises      General Comments General comments (skin integrity, edema, etc.): chair follow during gait      Pertinent Vitals/Pain Pain Assessment: No/denies pain    Home Living                      Prior Function            PT Goals (current goals can now be found in the care plan section) Acute Rehab PT Goals Patient Stated Goal: get back to normal Time For Goal Achievement: 09/19/19 Potential to Achieve Goals: Good Progress towards PT goals: Progressing toward goals    Frequency    Min 4X/week      PT Plan Current plan remains appropriate    Co-evaluation              AM-PAC PT "6 Clicks" Mobility   Outcome Measure  Help needed turning from your back to your side while in a flat bed without using bedrails?: A Little Help needed moving from lying on your back to sitting on the side of a flat bed without using bedrails?: A Little Help needed moving to and from a bed to a chair (including a wheelchair)?: A Lot Help needed standing up from a chair using your arms (e.g., wheelchair or bedside chair)?: A Little Help needed to walk in hospital room?: A Lot Help needed climbing 3-5 steps with a railing? : Total 6 Click Score: 14    End of Session Equipment Utilized During Treatment: Gait belt Activity Tolerance: Patient tolerated treatment well Patient left: in chair;with call bell/phone within reach;with chair alarm set   PT Visit Diagnosis: Ataxic gait (R26.0);Unsteadiness on feet (R26.81);Other  symptoms and signs involving the nervous system (R29.898)     Time: 2330-0762 PT Time Calculation (min) (ACUTE ONLY): 28 min  Charges:  $Gait Training: 8-22 mins $Neuromuscular Re-education: 8-22 mins                       Barry Brunner, PT       Rexanne Mano 09/07/2019, 10:37 AM

## 2019-09-07 NOTE — Progress Notes (Signed)
Occupational Therapy Treatment Patient Details Name: Virginia Franklin MRN: [id number] DOB: 10-15-1929 Today's Date: 09/07/2019    History of present illness Virginia Franklin is an 85 y.o. female independent living resident with a history of mechanical heart valve replacement 50 years ago, on Coumadin, who presents to the ED with acute onset of confusion, right sided numbness and difficulty ambulating. Imaging revealed small acute left thalamic hemorrhage. Other PMH including rhuematoid arthritis, osteoarthritis, osteoporosis, cervical fx, cervical cancer, and HTN.    OT comments  Pt progressing towards established OT goals and continues to present with high motivated to participate in therapy and return to PLOF. Pt requiring Mod A for stand pivot to recliner demonstrating decreased balance, strength, and coordination. Challenging pt RUE coordination during self feeding of breakfast facilitating use of RUE, targeted reaching, crossing midline, and bilateral coordination. Pt presenting with decreased cognition requiring increased time and cues. Pt also presenting with more garbled speech compared to prior session. Continue to recommend dc to CIR and will continue to follow acutely as admitted.    Follow Up Recommendations  CIR;Supervision/Assistance - 24 hour    Equipment Recommendations  Other (comment)(defer to next venue)    Recommendations for Other Services PT consult;Rehab consult    Precautions / Restrictions Precautions Precautions: Fall Restrictions Weight Bearing Restrictions: No       Mobility Bed Mobility Overal bed mobility: Needs Assistance Bed Mobility: Supine to Sit     Supine to sit: Min assist;HOB elevated     General bed mobility comments: Cuing pt for sequence of bed mobility. Pt able to bring BLEs towards EOB and then reach for OT's hand to pull into sitting with Min A.  Transfers Overall transfer level: Needs assistance Equipment used: Rolling  walker (2 wheeled);2 person hand held assist Transfers: Sit to/from [facility] Sit to Stand: +2 safety/equipment;Mod assist Stand pivot transfers: Mod assist;+2 safety/equipment       General transfer comment: Pt requiring Mod A for power up into standing. Pt presenting with poor coorindation of RLE and difficulty positioning during transfer. Pt requiring Mod A for balance and coorindation for pivot to recliner    Balance Overall balance assessment: Needs assistance Sitting-balance support: Feet supported;No upper extremity supported Sitting balance-Leahy Scale: Fair Sitting balance - Comments: Slight right lateral lean, but able to maintain sitting balance without physical A this session Postural control: Right lateral lean Standing balance support: Bilateral upper extremity supported;No upper extremity supported;During functional activity Standing balance-Leahy Scale: Poor Standing balance comment: Pt required RW and external support to maintain standing balance. Demonstrated R lateral lean while standing, required VCs to align to midline                           ADL either performed or assessed with clinical judgement   ADL Overall ADL's : Needs assistance/impaired Eating/Feeding: Set up;Supervision/ safety;Sitting Eating/Feeding Details (indicate cue type and reason): Focused session on RUE coorindation, bilateral coorindation, and FM skills during self feeding. Facilitating use of RUE thorughout self feeding as well as cuing for bilateral coorindation and crossing of midline to throw food items away in crash can on left side. Pt presenting with difficulty opening containers and requiring significant time; dropping several items. Pt also with undershooting while reaching to pick up utensils.                  Lower Body Dressing: Minimal assistance;Bed level;Moderate assistance Lower Body Dressing Details (indicate  cue type and reason): While supine  in bed with HOB elevated, cuing pt to adjust socks using figure four method. Pt attempting but having difficulty reaching with RUE and bringing RLE up to left knee. Requiring Min A for facilitate smooth movements. Continues to requiring Mod A for standing balance Toilet Transfer: Moderate assistance;Ambulation;+2 for physical assistance;+2 for safety/equipment;RW(simulated to recliner) Toilet Transfer Details (indicate cue type and reason): Mod A to power up into standing and then maintain balance during pivot to recliner. Pt requiring assistance for RW management. Presenting with ataxia at RLE and poor gross motor coorindation.          Functional mobility during ADLs: Moderate assistance;+2 for safety/equipment;Rolling walker(stand pivot) General ADL Comments: Pt continues to present with decreased coogintion, balance, cognition, and awareness. Continues to be highly motivated to particiapte in therapy.     Vision   Vision Assessment?: No apparent visual deficits   Perception     Praxis      Cognition Arousal/Alertness: Awake/alert Behavior During Therapy: WFL for tasks assessed/performed Overall Cognitive Status: Impaired/Different from baseline Area of Impairment: Attention;Memory;Safety/judgement;Problem solving;Awareness;Following commands                   Current Attention Level: Sustained Memory: Decreased short-term memory Following Commands: Follows one step commands with increased time Safety/Judgement: Decreased awareness of safety;Decreased awareness of deficits Awareness: Emergent Problem Solving: Requires verbal cues;Slow processing General Comments: Pt oriented to time and situation repeating that "I can't...I had a stroke" in responce to different cues. Pt internally distracted and requiring increased cues to attention. Pt requiring increased cues and time throughout. Also noting that pt with more garbled speech and tangiental conversation compared to eval.          Exercises     Shoulder Instructions       General Comments VSS throughout    Pertinent Vitals/ Pain       Pain Assessment: No/denies pain  Home Living                                          Prior Functioning/Environment              Frequency  Min 2X/week        Progress Toward Goals  OT Goals(current goals can now be found in the care plan section)  Progress towards OT goals: Progressing toward goals  Acute Rehab OT Goals Patient Stated Goal: get back to normal OT Goal Formulation: With patient Time For Goal Achievement: 09/26/19 Potential to Achieve Goals: Good ADL Goals Pt Will Perform Grooming: with supervision;standing Pt Will Perform Upper Body Dressing: with supervision;sitting Pt Will Perform Lower Body Dressing: sit to/from stand;with min guard assist Pt Will Transfer to Toilet: with min guard assist;ambulating;bedside commode Pt Will Perform Toileting - Clothing Manipulation and hygiene: with min guard assist;sit to/from stand Pt Will Perform Tub/Shower Transfer: Shower transfer;with min guard assist;ambulating;shower seat;rolling walker  Plan Discharge plan remains appropriate    Co-evaluation                 AM-PAC OT "6 Clicks" Daily Activity     Outcome Measure   Help from another person eating meals?: None Help from another person taking care of personal grooming?: A Lot Help from another person toileting, which includes using toliet, bedpan, or urinal?: A Lot Help from another person bathing (including washing, rinsing,  drying)?: A Lot Help from another person to put on and taking off regular upper body clothing?: A Little Help from another person to put on and taking off regular lower body clothing?: A Lot 6 Click Score: 15    End of Session Equipment Utilized During Treatment: Gait belt;Rolling walker  OT Visit Diagnosis: Unsteadiness on feet (R26.81);Other abnormalities of gait and mobility  (R26.89);Muscle weakness (generalized) (M62.81);Other symptoms and signs involving cognitive function;Cognitive communication deficit (R41.841) Symptoms and signs involving cognitive functions: (L thalamic hemorrhage)   Activity Tolerance Patient tolerated treatment well   Patient Left in chair;with call bell/phone within reach;with chair alarm set;with restraints reapplied   Nurse Communication Mobility status        Time: [id number] OT Time Calculation (min): 27 min  Charges: OT General Charges $OT Visit: 1 Visit OT Treatments $Self Care/Home Management : 23-37 mins  [place], OTR/L Acute Rehab Pager: [phone] Office: [place] 09/07/2019, 9:49 AM

## 2019-09-08 ENCOUNTER — Inpatient Hospital Stay (HOSPITAL_COMMUNITY): Payer: Medicare HMO

## 2019-09-08 DIAGNOSIS — I6389 Other cerebral infarction: Secondary | ICD-10-CM

## 2019-09-08 LAB — ECHOCARDIOGRAM COMPLETE
Height: 65 in
Weight: 2127 oz

## 2019-09-08 NOTE — Care Management Important Message (Signed)
Important Message  Patient Details  Name: Virginia Franklin MRN: [id number] Date of Birth: 07-07-1929   Medicare Important Message Given:        Orbie Pyo 09/08/2019, 2:44 PM

## 2019-09-08 NOTE — Progress Notes (Signed)
  Echocardiogram 2D Echocardiogram has been performed.  Virginia Franklin 09/08/2019, 10:21 AM

## 2019-09-08 NOTE — Progress Notes (Addendum)
STROKE TEAM PROGRESS NOTE    INTERVAL HISTORY She is neurologically stable alert oriented today.  She wants to go home but therapists are recommending rehab.  Blood pressure adequately controlled. No neurological changes.  Vital signs are stable.   OBJECTIVE Vitals:   09/08/19 0346 09/08/19 0814 09/08/19 1019 09/08/19 1132  BP: (!) 124/59 136/62 (!) 133/53 139/63  Pulse: 64 68 70 72  Resp: 16 16  16   Temp: 98.3 F (36.8 C) 98 F (36.7 C)  98.3 F (36.8 C)  TempSrc: Oral Oral  Oral  SpO2: 96% 98% 98% 95%  Weight:      Height:        CBC:  Recent Labs  Lab 09/04/19 1320  WBC 4.6  NEUTROABS 2.8  HGB 14.7  15.6*  HCT 44.3  46.0  MCV 91.5  PLT 99991111    Basic Metabolic Panel:  Recent Labs  Lab 09/04/19 1320  NA 138  139  K 4.1  4.1  CL 104  103  CO2 24  GLUCOSE 101*  102*  BUN 15  20  CREATININE 0.81  0.70  CALCIUM 9.7    Lipid Panel:     Component Value Date/Time   CHOL 149 01/13/2018 1219   TRIG 63.0 01/13/2018 1219   HDL 72.40 01/13/2018 1219   CHOLHDL 2 01/13/2018 1219   VLDL 12.6 01/13/2018 1219   LDLCALC 64 01/13/2018 1219   HgbA1c:  Lab Results  Component Value Date   HGBA1C 5.5 12/17/2017   Urine Drug Screen: No results found for: LABOPIA, COCAINSCRNUR, LABBENZ, AMPHETMU, THCU, LABBARB  Alcohol Level No results found for: ETH  IMAGING  Ct Angio Head W Or Wo Contrast Ct Angio Neck W Or Wo Contrast 09/04/2019 IMPRESSION:   CTA neck:  The bilateral common carotid, internal carotid and vertebral arteries are patent within the neck without significant stenosis (50% or greater). Atherosclerotic disease within the major branch vessels of the neck, most notably as follows. Mild calcified plaque at the right carotid bifurcation and within the proximal right ICA. Plaque at the origin of the non dominant right vertebral artery results in mild/moderate ostial stenosis.   CTA head:  1. No intracranial aneurysm is identified.  2. No large  vessel occlusion or proximal high-grade arterial stenosis. Mild calcified plaque within the carotid artery siphons and intracranial vertebral arteries bilaterally.  3. 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 Correlate for history of chronic hypertension.   Dg Chest Port 1 View 09/04/2019 IMPRESSION:  No active cardiopulmonary disease.   Ct Head Code Stroke Wo Contrast 09/04/2019 IMPRESSION:  1. Acute hemorrhage involving the left lateral thalamus and posterior limb internal capsule. Estimated blood volume less than 1 mL.  2. Atrophy and chronic microvascular ischemia.   CT Head WO Contrast 09/05/2019 IMPRESSION: 1. Slight interval increase in size of an acute left thalamocapsular junction parenchymal hemorrhage, now measuring 6 x 12 x 22 mm (previously 6 x 12 x 18 mm). 2. Generalized parenchymal atrophy and chronic small vessel ischemic disease.   Transthoracic Echocardiogram  01/10/2018 Impressions: Normal LV size with mild LV hypertrophy. EF 60-65%. Normal RV size and systolic function. St Jude mechanical aortic valve was functioning normally.  ECG -not done EEG - not ordered   PHYSICAL EXAM Blood pressure 139/63, pulse 72, temperature 98.3 F (36.8 C), temperature source Oral, resp. rate 16, height 5\' 5"  (1.651 m), weight 60.3 kg, SpO2 95 %. Frail pleasant elderly Caucasian lady not in distress. Neurological Exam : awake,  alert, oriented to year, place, city, state, and president.  Disoriented to month, date, and day. Fluent, C/N/R- intact. Face symmetric, tongue midline. EOMI. Mild weakness of intrinsic hand muscles on the right.  Diminished fine finger movements on the right.  Orbits left over right upper extremity., but otherwise 5/5 BUE and BLE.  Mild weakness of right hip flexors and ankle dorsiflexors.  Impaired right knee to heel coordination. Decreased sensation right arm. Gait deferred  ASSESSMENT/PLAN Virginia Franklin is a 85 y.o.  female with history of mechanical heart valve replacement 50 years ago, on Coumadin, htn, RA and hx of stroke 2012 who presents to the ED with acute onset of confusion, right sided numbness, difficulty ambulating and speech difficulties. CT - small acute left thalamic hemorrhage. - INR 2.5 -> K-Centra and vitamin K.  She did not receive IV t-PA due to [place].  Acute hemorrhage involving the left lateral thalamus and posterior limb internal capsule - coumadin / hypertension.  Resultant  Right side numbness, weakness  Code Stroke CT Head - Acute hemorrhage involving the left lateral thalamus and posterior limb internal capsule. Estimated blood volume less than 1 mL. Atrophy and chronic microvascular ischemia.   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 Correlate for history of chronic hypertension.   CT Perfusion - not ordered  Carotid Doppler - CTA neck performed - carotid dopplers not indicated.  2D Echo - 01/10/2018 - Normal LV size with mild LV hypertrophy. EF 60-65%. Normal RV size and systolic function. St Jude mechanical aortic valve was functioning normally.  [employer] Virus 2  - negative  LDL - not ordered  HgbA1c - 5.5  UDS - not ordered  VTE prophylaxis - SCDs Diet  Diet Order            Diet Heart Room service appropriate? Yes; Fluid consistency: Thin  Diet effective now              warfarin daily prior to admission, now on No antithrombotic  Ongoing aggressive stroke risk factor management  Therapy recommendations:  CIR recommended - inpt rehab MD consult placed  Disposition:  Pending  Hypertension  Home BP meds: Lasix ; Zestril  Current BP meds: Lasix ; Zestril ; labetalol ; Cleviprex  Stable . SBP goal < 160 mm Hg . Long-term BP goal normotensive . SBP  currently running < 120 mm Hg  Hyperlipidemia  Home Lipid lowering medication: none  LDL - not ordered, goal < 70  Current lipid lowering medication: none  Other Stroke Risk Factors  Advanced age  Former cigarette smoker - quit  ETOH use, advised to drink no more than 1 alcoholic beverage per day.  Hx of stroke 2012  St Jude mechanical aortic valve (coumadin)  Other Active Problems  St Jude mechanical aortic valve - coumadin reversed - will need to determine when it is safe to resume anticoagulation.  Pt seen by Dr Rory Percy 1:30 this AM for agitation. CT reviewed. Seroquel added.   Hospital day # 4 Patient is doing well neurologically.  Mobilize out of bed.  Continue ongoing therapies and likely transfer to inpatient rehab in the next few days.  Transfer to neurology floor bed today.  Continue aggressive blood pressure control with systolic blood pressure goal below 160.  The patient  presents therapeutic challenge as she needs anticoagulation for mechanical heart valve but given intracerebral hemorrhage she will need to be off anticoagulation at least for 1 to 2 weeks and she runs a significant risk of thrombosis and recurrent strokes during the.  She remains off anticoagulation.  I have discussed the risk benefit with the patient and and her daughter over the phone  and answered questions.  No family available for discussion.  Greater than 50% time during this 25-minute visit was spent on counseling and coordination of care about her intracerebral hemorrhage, hypertension and discussion with care team Antony Contras, MD Medical Director [place] Pager: [phone] 09/08/2019 4:17 PM   To contact Stroke Continuity provider, please refer to [email or web address] After hours, contact General Neurology

## 2019-09-08 NOTE — Consult Note (Signed)
Inpatient Rehab Admissions:  Inpatient Rehab Consult received.  I met with pt at the bedside for rehabilitation assessment. Pt was able to tell me her prior level of functioning and details about her home set up at [facility] ([facility]). Pt was Independent prior to admission, used a rollator for energy conservation and participated in walking groups and group therapy sessions in the past at her ILF. With her permission I spoke with her daughter regarding support at DC. Her daughter confirmed they will have the recommended support at DC (via hired assistance) and would like to proceed with CIR.   Feel pt is a great candidate for CIR and will proceed with insurance authorization request.   Will follow up once a determination has been made.   Please call if questions.   Jhonnie Garner, OTR/L  Rehab Admissions Coordinator  [phone] 09/08/2019 5:18 PM

## 2019-09-08 NOTE — Progress Notes (Signed)
Physical Therapy Treatment Patient Details Name: Virginia Franklin MRN: [id number] DOB: Feb 17, 1929 Today's Date: 09/08/2019    History of Present Illness Virginia Franklin is an 85 y.o. female independent living resident with a history of mechanical heart valve replacement 50 years ago, on Coumadin, who presents to the ED with acute onset of confusion, right sided numbness and difficulty ambulating. Imaging revealed small acute left thalamic hemorrhage. Other PMH including rhuematoid arthritis, osteoarthritis, osteoporosis, cervical fx, cervical cancer, and HTN.     PT Comments    Pt looks to have improved somewhat with less ataxia and generally more steady gait pattern.  Emphasis on bed mobility, sit to stand and gait training.    Follow Up Recommendations  CIR;Supervision/Assistance - 24 hour     Equipment Recommendations       Recommendations for Other Services Rehab consult     Precautions / Restrictions Precautions Precautions: Fall    Mobility  Bed Mobility Overal bed mobility: Needs Assistance Bed Mobility: Supine to Sit     Supine to sit: Min guard     General bed mobility comments: min guard with struggle and use of opposite rail to come up onto R elbow then scoot to L side of the bed.  Transfers Overall transfer level: Needs assistance Equipment used: Rolling walker (2 wheeled) Transfers: Sit to/from Stand Sit to Stand: Min assist         General transfer comment: vc for hand placement whether bed or RW  Ambulation/Gait Ambulation/Gait assistance: Mod assist;+2 safety/equipment Gait Distance (Feet): 75 Feet(then 50 feet with short rest for muscle fatigue) Assistive device: Rolling walker (2 wheeled) Gait Pattern/deviations: Step-through pattern;Decreased stride length;Narrow base of support;Decreased dorsiflexion - right Gait velocity: decreased Gait velocity interpretation: <1.8 ft/sec, indicate of risk for recurrent falls General Gait  Details: pt was less ataxic the uncoordinated on the RLE.  Worked on w/shift L, and heel toe pattern on the right..  R UE needed minimal stability as well as help maneurving the RW   Stairs             Wheelchair Mobility    Modified Rankin (Stroke Patients Only) Modified Rankin (Stroke Patients Only) Pre-Morbid Rankin Score: Moderate disability Modified Rankin: Moderately severe disability     Balance Overall balance assessment: Needs assistance Sitting-balance support: Feet supported;No upper extremity supported Sitting balance-Leahy Scale: Fair     Standing balance support: During functional activity Standing balance-Leahy Scale: Poor Standing balance comment: Improving, but still needing assist of device or external support                            Cognition Arousal/Alertness: Awake/alert Behavior During Therapy: WFL for tasks assessed/performed Overall Cognitive Status: (NT formally, but follow commands and instruction well)                                        Exercises      General Comments General comments (skin integrity, edema, etc.): vss      Pertinent Vitals/Pain Pain Assessment: No/denies pain    Home Living                      Prior Function            PT Goals (current goals can now be found in the care plan section) Acute Rehab  PT Goals Patient Stated Goal: get back to normal PT Goal Formulation: With patient Time For Goal Achievement: 09/19/19 Potential to Achieve Goals: Good Progress towards PT goals: Progressing toward goals    Frequency    Min 4X/week      PT Plan Current plan remains appropriate    Co-evaluation              AM-PAC PT "6 Clicks" Mobility   Outcome Measure  Help needed turning from your back to your side while in a flat bed without using bedrails?: A Little Help needed moving from lying on your back to sitting on the side of a flat bed without using  bedrails?: A Little Help needed moving to and from a bed to a chair (including a wheelchair)?: A Little Help needed standing up from a chair using your arms (e.g., wheelchair or bedside chair)?: A Little Help needed to walk in hospital room?: A Lot Help needed climbing 3-5 steps with a railing? : A Lot 6 Click Score: 16    End of Session   Activity Tolerance: Patient tolerated treatment well Patient left: in chair;with call bell/phone within reach;with chair alarm set Nurse Communication: Mobility status PT Visit Diagnosis: Other abnormalities of gait and mobility (R26.89);Difficulty in walking, not elsewhere classified (R26.2)     Time: [id number] PT Time Calculation (min) (ACUTE ONLY): 22 min  Charges:  $Gait Training: 8-22 mins                     09/08/2019  Donnella Sham, PT Acute Rehabilitation Services [phone]  (pager) [phone]  (office)   Tessie Fass Zarai Orsborn 09/08/2019, 5:48 PM

## 2019-09-09 NOTE — Plan of Care (Signed)
Patient assisted with set up for bath at sink. Patient was able to wash self and brush teeth while setting in front of sink.

## 2019-09-09 NOTE — Progress Notes (Addendum)
STROKE TEAM PROGRESS NOTE    INTERVAL HISTORY No neurological changes. Vital signs are stable. She is ready for d/c to inpt rehab. Awaiting insurance pre-cert.   OBJECTIVE Vitals:   09/09/19 0350 09/09/19 0827 09/09/19 1127 09/09/19 1558  BP: 113/65 (!) 141/56 (!) 135/53 (!) 112/48  Pulse: 69 72 63 78  Resp: 16 16 16 16   Temp: 98.6 F (37 C) 98.2 F (36.8 C) 98.3 F (36.8 C) 98.5 F (36.9 C)  TempSrc: Oral Oral Oral Oral  SpO2: 99% 96% 98% 97%  Weight:      Height:        CBC:  Recent Labs  Lab 09/04/19 1320  WBC 4.6  NEUTROABS 2.8  HGB 14.7  15.6*  HCT 44.3  46.0  MCV 91.5  PLT 99991111    Basic Metabolic Panel:  Recent Labs  Lab 09/04/19 1320  NA 138  139  K 4.1  4.1  CL 104  103  CO2 24  GLUCOSE 101*  102*  BUN 15  20  CREATININE 0.81  0.70  CALCIUM 9.7    Lipid Panel:     Component Value Date/Time   CHOL 149 01/13/2018 1219   TRIG 63.0 01/13/2018 1219   HDL 72.40 01/13/2018 1219   CHOLHDL 2 01/13/2018 1219   VLDL 12.6 01/13/2018 1219   LDLCALC 64 01/13/2018 1219   HgbA1c:  Lab Results  Component Value Date   HGBA1C 5.5 12/17/2017   Urine Drug Screen: No results found for: LABOPIA, COCAINSCRNUR, LABBENZ, AMPHETMU, THCU, LABBARB  Alcohol Level No results found for: ETH  IMAGING  Ct Angio Head W Or Wo Contrast Ct Angio Neck W Or Wo Contrast 09/04/2019 IMPRESSION:   CTA neck:  The bilateral common carotid, internal carotid and vertebral arteries are patent within the neck without significant stenosis (50% or greater). Atherosclerotic disease within the major branch vessels of the neck, most notably as follows. Mild calcified plaque at the right carotid bifurcation and within the proximal right ICA. Plaque at the origin of the non dominant right vertebral artery results in mild/moderate ostial stenosis.   CTA head:  1. No intracranial aneurysm is identified.  2. No large vessel occlusion or proximal high-grade arterial stenosis. Mild  calcified plaque within the carotid artery siphons and intracranial vertebral arteries bilaterally.  3. 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 Correlate for history of chronic hypertension.   Dg Chest Port 1 View 09/04/2019 IMPRESSION:  No active cardiopulmonary disease.   Ct Head Code Stroke Wo Contrast 09/04/2019 IMPRESSION:  1. Acute hemorrhage involving the left lateral thalamus and posterior limb internal capsule. Estimated blood volume less than 1 mL.  2. Atrophy and chronic microvascular ischemia.   CT Head WO Contrast 09/05/2019 IMPRESSION: 1. Slight interval increase in size of an acute left thalamocapsular junction parenchymal hemorrhage, now measuring 6 x 12 x 22 mm (previously 6 x 12 x 18 mm). 2. Generalized parenchymal atrophy and chronic small vessel ischemic disease.   Transthoracic Echocardiogram  01/10/2018 Impressions: Normal LV size with mild LV hypertrophy. EF 60-65%. Normal RV size and systolic function. St Jude mechanical aortic valve was functioning normally.  ECG -not done EEG - not ordered   PHYSICAL EXAM Blood pressure (!) 112/48, pulse 78, temperature 98.5 F (36.9 C), temperature source Oral, resp. rate 16, height 5\' 5"  (1.651 m), weight 60.3 kg, SpO2 97 %. Frail pleasant elderly Caucasian lady not in distress. Neurological Exam : A&Ox4, gets all info correct and appropriate in conversation  today.  Fluent, C/N/R- intact. Face symmetric, tongue midline. EOMI. Mild weakness of intrinsic hand muscles on the right.  Diminished fine finger movements on the right.  Orbits left over right upper extremity., but otherwise 5/5 BUE and BLE.  Mild weakness of right hip flexors and ankle dorsiflexors.  Impaired right knee to heel coordination. Decreased sensation right arm. Gait deferred  ASSESSMENT/PLAN Virginia Franklin is a 85 y.o. female with history of mechanical heart valve replacement 50 years ago, on Coumadin, htn, RA and hx  of stroke 2012 who presents to the ED with acute onset of confusion, right sided numbness, difficulty ambulating and speech difficulties. CT - small acute left thalamic hemorrhage. - INR 2.5 -> K-Centra and vitamin K.  She did not receive IV t-PA due to [place].  Acute hemorrhage involving the left lateral thalamus and posterior limb internal capsule - coumadin / hypertension.  Resultant  Right side numbness, weakness  Code Stroke CT Head - Acute hemorrhage involving the left lateral thalamus and posterior limb internal capsule. Estimated blood volume less than 1 mL. Atrophy and chronic microvascular ischemia.   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 Correlate for history of chronic hypertension.   CT Perfusion - not ordered  Carotid Doppler - CTA neck performed - carotid dopplers not indicated.  2D Echo - 01/10/2018 - Normal LV size with mild LV hypertrophy. EF 60-65%. Normal RV size and systolic function. St Jude mechanical aortic valve was functioning normally.  [employer] Virus 2  - negative  LDL - not ordered  HgbA1c - 5.5  UDS - not ordered  VTE prophylaxis - SCDs Diet  Diet Order            Diet Heart Room service appropriate? Yes; Fluid consistency: Thin  Diet effective now              warfarin daily prior to admission, now on No antithrombotic d/t bleed  Ongoing aggressive stroke risk factor management  Therapy recommendations:  CIR recommended; pre-cert started with insurance today.   Disposition: CIR in next 24h pending insurance  Hypertension  Home BP meds: Lasix ; Zestril  Current BP meds: Lasix ; Zestril ; labetalol ; Cleviprex  Stable . SBP goal < 160 mm Hg . Long-term BP goal normotensive . SBP currently running < 120 mm Hg  Hyperlipidemia  Home Lipid  lowering medication: none  LDL - not ordered, goal < 70  Current lipid lowering medication: none  Other Stroke Risk Factors  Advanced age  Former cigarette smoker - quit  ETOH use, advised to drink no more than 1 alcoholic beverage per day.  Hx of stroke 2012  St Jude mechanical aortic valve (coumadin)  Other Active Problems  St Jude mechanical aortic valve - coumadin reversed - will need to determine when it is safe to resume anticoagulation.  Delirium, sundowning- Improved after seroquel added. Re-orient as able and keep night/day cycles normal as possible.   Hospital day # 5 Patient is doing well neurologically.  Mobilize out of bed.  Continue ongoing therapies and likely transfer to inpatient rehab in the next few days.   Desiree Metzger-Cihelka, ARNP-C, ANVP-BC Pager: [phone]  09/09/2019 5:45 PM  I have personally obtained history,examined this patient, reviewed notes, independently viewed imaging studies, participated in medical  decision making and plan of care.ROS completed by me personally and pertinent positives fully documented  I have made any additions or clarifications directly to the above note. Agree with note above.    Antony Contras, MD Medical Director [place] Pager: [phone] 09/10/2019 8:27 AM  To contact Stroke Continuity provider, please refer to [email or web address] After hours, contact General Neurology

## 2019-09-09 NOTE — Progress Notes (Addendum)
Physical Therapy Treatment Patient Details Name: Virginia Franklin MRN: [id number] DOB: 25-Oct-1929 Today's Date: 09/09/2019    History of Present Illness Virginia Franklin is an 85 y.o. female independent living resident with a history of mechanical heart valve replacement 50 years ago, on Coumadin, who presents to the ED with acute onset of confusion, right sided numbness and difficulty ambulating. Imaging revealed small acute left thalamic hemorrhage. Other PMH including rhuematoid arthritis, osteoarthritis, osteoporosis, cervical fx, cervical cancer, and HTN.     PT Comments    Patient progressing with mobility now min A for ambulation, but slow, fatigues quickly and needs multiple cues for safety.  Endorses knee and foot pain with ambulation related to her arthritis.  Would benefit from improved footwear.  Continue to feel she is appropriate for CIR level rehab.  PT to follow acutely.    Follow Up Recommendations  CIR;Supervision/Assistance - 24 hour     Equipment Recommendations  Other (comment)(TBA)    Recommendations for Other Services       Precautions / Restrictions Precautions Precautions: Fall    Mobility  Bed Mobility               General bed mobility comments: up in chair finishing bathing with RN  Transfers Overall transfer level: Needs assistance Equipment used: Rolling walker (2 wheeled) Transfers: Sit to/from Stand Sit to Stand: Min assist         General transfer comment: mod cues for scooting out to edge, feet back under her and using arms of chair  Ambulation/Gait Ambulation/Gait assistance: Min assist;+2 physical assistance Gait Distance (Feet): 45 Feet(&40') Assistive device: Rolling walker (2 wheeled) Gait Pattern/deviations: Step-to pattern;Step-through pattern;Wide base of support;Decreased stride length;Narrow base of support     General Gait Details: cues for walker proximity, assist for balance, cues for posture, jerky with  movement of RW and inconsistent steps on R, initially too narrow   Stairs             Wheelchair Mobility    Modified Rankin (Stroke Patients Only) Modified Rankin (Stroke Patients Only) Pre-Morbid Rankin Score: Moderate disability Modified Rankin: Moderately severe disability     Balance Overall balance assessment: Needs assistance Sitting-balance support: Feet supported Sitting balance-Leahy Scale: Good Sitting balance - Comments: attempted to don socks sitting crossed R leg over L but unable to reach due to arthritic limitations     Standing balance-Leahy Scale: Poor Standing balance comment: UE support and assist for balance                            Cognition Arousal/Alertness: Awake/alert Behavior During Therapy: WFL for tasks assessed/performed Overall Cognitive Status: Impaired/Different from baseline                       Memory: Decreased short-term memory Following Commands: Follows one step commands with increased time;Follows one step commands consistently Safety/Judgement: Decreased awareness of safety     General Comments: noted difficulty with remembering sequence for sit to stand, needed cues each time and reminders about posture and proximity with walker      Exercises Other Exercises Other Exercises: seated coordination activity lifting leg moving out to side and back small movements each leg    General Comments General comments (skin integrity, edema, etc.): HR 81 with ambulation      Pertinent Vitals/Pain Pain Assessment: Faces Faces Pain Scale: Hurts little more Pain Location: knees and feet  due to arthritis while walking Pain Descriptors / Indicators: Aching;Tightness Pain Intervention(s): Monitored during session;Repositioned;Limited activity within patient's tolerance;Heat applied    Home Living                      Prior Function            PT Goals (current goals can now be found in the care  plan section) Progress towards PT goals: Progressing toward goals    Frequency    Min 4X/week      PT Plan Current plan remains appropriate    Co-evaluation              AM-PAC PT "6 Clicks" Mobility   Outcome Measure  Help needed turning from your back to your side while in a flat bed without using bedrails?: A Little Help needed moving from lying on your back to sitting on the side of a flat bed without using bedrails?: A Little Help needed moving to and from a bed to a chair (including a wheelchair)?: A Little Help needed standing up from a chair using your arms (e.g., wheelchair or bedside chair)?: A Little Help needed to walk in hospital room?: A Little Help needed climbing 3-5 steps with a railing? : A Lot 6 Click Score: 17    End of Session Equipment Utilized During Treatment: Gait belt Activity Tolerance: Patient limited by pain Patient left: in chair;with call bell/phone within reach   PT Visit Diagnosis: Other abnormalities of gait and mobility (R26.89);Difficulty in walking, not elsewhere classified (R26.2)     Time: [id number] PT Time Calculation (min) (ACUTE ONLY): 30 min  Charges:  $Gait Training: 8-22 mins $Therapeutic Activity: 8-22 mins                     Magda Kiel, [place] [phone] 09/09/2019    Reginia Naas 09/09/2019, 10:32 AM

## 2019-09-10 NOTE — Progress Notes (Signed)
Inpatient Rehabilitation-Admissions Coordinator   [facility] is still awaiting an insurance decision regarding CIR. Will follow up once there has been a determination.   Please call if questions.   Jhonnie Garner, OTR/L  Rehab Admissions Coordinator  [phone] 09/10/2019 4:29 PM

## 2019-09-10 NOTE — Progress Notes (Signed)
Physical Therapy Treatment Patient Details Name: Virginia Franklin MRN: [id number] DOB: Jul 16, 1929 Today's Date: 09/10/2019    History of Present Illness Virginia Franklin is an 85 y.o. female independent living resident with a history of mechanical heart valve replacement 50 years ago, on Coumadin, who presents to the ED with acute onset of confusion, right sided numbness and difficulty ambulating. Imaging revealed small acute left thalamic hemorrhage. Other PMH including rhuematoid arthritis, osteoarthritis, osteoporosis, cervical fx, cervical cancer, and HTN.     PT Comments    Patient endorses more soreness all over today due to using muscles not frequently used to mobilize over the past couple of days.  She also continues with arthritic pain in her feet and knees.  Spoke with daughter by phone about trying to bring in shoes.  Patient progressed during session with ability to get more upright and progress ambulation over time and with warm up exercises.  Feel she remains appropriate for CIR level rehab to maximize mobility, balance, safety and strength.   Follow Up Recommendations  CIR;Supervision/Assistance - 24 hour     Equipment Recommendations  Other (comment)(to be assessed)    Recommendations for Other Services       Precautions / Restrictions Precautions Precautions: Fall    Mobility  Bed Mobility Overal bed mobility: Needs Assistance Bed Mobility: Rolling;Sidelying to Sit Rolling: Min guard Sidelying to sit: Min guard       General bed mobility comments: cues for technique due to c/o sore all over from using muscles differently to get up  Transfers Overall transfer level: Needs assistance Equipment used: Rolling walker (2 wheeled) Transfers: Sit to/from Stand Sit to Stand: Mod assist         General transfer comment: increased time, slow to rise with pain upon standing and getting up in knees and feet; still with mod cues for technique with feet, hand  placement and anterior weight shift  Ambulation/Gait Ambulation/Gait assistance: Mod assist;Min assist Gait Distance (Feet): 30 Feet(& 10' x 2) Assistive device: Rolling walker (2 wheeled) Gait Pattern/deviations: Step-to pattern;Decreased stride length;Decreased step length - right;Trunk flexed;Wide base of support;Decreased dorsiflexion - right     General Gait Details: slow and painful with flexed posture and difficulty progressing the R LE.  Improved some over time and trials especially with posture and with cues   Stairs             Wheelchair Mobility    Modified Rankin (Stroke Patients Only) Modified Rankin (Stroke Patients Only) Pre-Morbid Rankin Score: Moderate disability Modified Rankin: Moderately severe disability     Balance Overall balance assessment: Needs assistance Sitting-balance support: Feet supported Sitting balance-Leahy Scale: Good     Standing balance support: Bilateral upper extremity supported Standing balance-Leahy Scale: Poor                              Cognition Arousal/Alertness: Awake/alert Behavior During Therapy: WFL for tasks assessed/performed Overall Cognitive Status: Impaired/Different from baseline Area of Impairment: Attention;Memory;Safety/judgement                   Current Attention Level: Sustained Memory: Decreased short-term memory   Safety/Judgement: Decreased awareness of safety            Exercises General Exercises - Lower Extremity Ankle Circles/Pumps: AROM;5 reps;Seated;Both Long Arc Quad: AROM;5 reps;Seated;Both Hip Flexion/Marching: AROM;5 reps;Seated;Both Other Exercises Other Exercises: shoulder rolls backward x 5 seated    General Comments  General comments (skin integrity, edema, etc.): HE 84 with ambulation; noted itching and rash appearing on bilateral antecubital areas, pt applied vaseline and RN made aware      Pertinent Vitals/Pain Faces Pain Scale: Hurts even  more Pain Location: knees and feet due to arthritis while walking Pain Descriptors / Indicators: Grimacing;Aching Pain Intervention(s): Monitored during session;Repositioned;Limited activity within patient's tolerance;Premedicated before session    Home Living                      Prior Function            PT Goals (current goals can now be found in the care plan section) Progress towards PT goals: Not progressing toward goals - comment(due to soreness/arthritic pain)    Frequency    Min 4X/week      PT Plan Current plan remains appropriate    Co-evaluation              AM-PAC PT "6 Clicks" Mobility   Outcome Measure  Help needed turning from your back to your side while in a flat bed without using bedrails?: A Little Help needed moving from lying on your back to sitting on the side of a flat bed without using bedrails?: A Little Help needed moving to and from a bed to a chair (including a wheelchair)?: A Lot Help needed standing up from a chair using your arms (e.g., wheelchair or bedside chair)?: A Lot Help needed to walk in hospital room?: A Lot Help needed climbing 3-5 steps with a railing? : Total 6 Click Score: 13    End of Session Equipment Utilized During Treatment: Gait belt Activity Tolerance: Patient limited by pain Patient left: in chair;with call bell/phone within reach;with chair alarm set   PT Visit Diagnosis: Other abnormalities of gait and mobility (R26.89);Difficulty in walking, not elsewhere classified (R26.2)     Time: [id number] PT Time Calculation (min) (ACUTE ONLY): 30 min  Charges:  $Gait Training: 8-22 mins $Therapeutic Exercise: 8-22 mins                     Virginia Franklin, [place] [phone] 09/10/2019    Virginia Franklin 09/10/2019, 2:50 PM

## 2019-09-10 NOTE — Progress Notes (Signed)
STROKE TEAM PROGRESS NOTE    INTERVAL HISTORY No neurological changes.  She is ready for d/c to inpt rehab. Awaiting insurance pre-cert. no complaints.  Vital signs stable  OBJECTIVE Vitals:   09/10/19 0036 09/10/19 0426 09/10/19 0842 09/10/19 1255  BP: (!) 132/55 123/62 (!) 121/53 105/75  Pulse: 82 79 83 75  Resp: 19 18 18 18   Temp: 98.6 F (37 C) 100 F (37.8 C) 98.5 F (36.9 C) 98.2 F (36.8 C)  TempSrc: Oral Oral Oral Oral  SpO2: 94% 93% 94% 96%  Weight:      Height:        CBC:  Recent Labs  Lab 09/04/19 1320  WBC 4.6  NEUTROABS 2.8  HGB 14.7  15.6*  HCT 44.3  46.0  MCV 91.5  PLT 99991111    Basic Metabolic Panel:  Recent Labs  Lab 09/04/19 1320  NA 138  139  K 4.1  4.1  CL 104  103  CO2 24  GLUCOSE 101*  102*  BUN 15  20  CREATININE 0.81  0.70  CALCIUM 9.7    Lipid Panel:     Component Value Date/Time   CHOL 149 01/13/2018 1219   TRIG 63.0 01/13/2018 1219   HDL 72.40 01/13/2018 1219   CHOLHDL 2 01/13/2018 1219   VLDL 12.6 01/13/2018 1219   LDLCALC 64 01/13/2018 1219   HgbA1c:  Lab Results  Component Value Date   HGBA1C 5.5 12/17/2017   Urine Drug Screen: No results found for: LABOPIA, COCAINSCRNUR, LABBENZ, AMPHETMU, THCU, LABBARB  Alcohol Level No results found for: ETH  IMAGING  Ct Angio Head W Or Wo Contrast Ct Angio Neck W Or Wo Contrast 09/04/2019 IMPRESSION:   CTA neck:  The bilateral common carotid, internal carotid and vertebral arteries are patent within the neck without significant stenosis (50% or greater). Atherosclerotic disease within the major branch vessels of the neck, most notably as follows. Mild calcified plaque at the right carotid bifurcation and within the proximal right ICA. Plaque at the origin of the non dominant right vertebral artery results in mild/moderate ostial stenosis.   CTA head:  1. No intracranial aneurysm is identified.  2. No large vessel occlusion or proximal high-grade arterial stenosis.  Mild calcified plaque within the carotid artery siphons and intracranial vertebral arteries bilaterally.  3. 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 Correlate for history of chronic hypertension.   Dg Chest Port 1 View 09/04/2019 IMPRESSION:  No active cardiopulmonary disease.   Ct Head Code Stroke Wo Contrast 09/04/2019 IMPRESSION:  1. Acute hemorrhage involving the left lateral thalamus and posterior limb internal capsule. Estimated blood volume less than 1 mL.  2. Atrophy and chronic microvascular ischemia.   CT Head WO Contrast 09/05/2019 IMPRESSION: 1. Slight interval increase in size of an acute left thalamocapsular junction parenchymal hemorrhage, now measuring 6 x 12 x 22 mm (previously 6 x 12 x 18 mm). 2. Generalized parenchymal atrophy and chronic small vessel ischemic disease.   Transthoracic Echocardiogram  01/10/2018 Impressions: Normal LV size with mild LV hypertrophy. EF 60-65%. Normal RV size and systolic function. St Jude mechanical aortic valve was functioning normally.  ECG -not done EEG - not ordered   PHYSICAL EXAM Blood pressure 105/75, pulse 75, temperature 98.2 F (36.8 C), temperature source Oral, resp. rate 18, height 5\' 5"  (1.651 m), weight 60.3 kg, SpO2 96 %. Frail pleasant elderly Caucasian lady not in distress. Neurological Exam : A&Ox4, gets all info correct and appropriate in conversation  today.  Fluent, C/N/R- intact. Face symmetric, tongue midline. EOMI. Mild weakness of intrinsic hand muscles on the right.  Diminished fine finger movements on the right.  Orbits left over right upper extremity., but otherwise 5/5 BUE and BLE.  Mild weakness of right hip flexors and ankle dorsiflexors.  Impaired right knee to heel coordination. Decreased sensation right arm. Gait deferred  ASSESSMENT/PLAN [name] is a 85 y.o. female with history of mechanical heart valve replacement 50 years ago, on Coumadin, htn, RA and hx  of stroke 2012 who presents to the ED with acute onset of confusion, right sided numbness, difficulty ambulating and speech difficulties. CT - small acute left thalamic hemorrhage. - INR 2.5 -> K-Centra and vitamin K.  She did not receive IV t-PA due to [place].  Acute hemorrhage involving the left lateral thalamus and posterior limb internal capsule - coumadin / hypertension.  Resultant  Right side numbness, weakness  Code Stroke CT Head - Acute hemorrhage involving the left lateral thalamus and posterior limb internal capsule. Estimated blood volume less than 1 mL. Atrophy and chronic microvascular ischemia.   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 Correlate for history of chronic hypertension.   CT Perfusion - not ordered  Carotid Doppler - CTA neck performed - carotid dopplers not indicated.  2D Echo - 01/10/2018 - Normal LV size with mild LV hypertrophy. EF 60-65%. Normal RV size and systolic function. St Jude mechanical aortic valve was functioning normally.  [employer] Virus 2  - negative  LDL - not ordered  HgbA1c - 5.5  UDS - not ordered  VTE prophylaxis - SCDs Diet  Diet Order            Diet Heart Room service appropriate? Yes; Fluid consistency: Thin  Diet effective now              warfarin daily prior to admission, now on No antithrombotic d/t bleed  Ongoing aggressive stroke risk factor management  Therapy recommendations:  CIR recommended; pre-cert started with insurance today.   Disposition: CIR in next 24h pending insurance  Hypertension  Home BP meds: Lasix ; Zestril  Current BP meds: Lasix ; Zestril ; labetalol ; Cleviprex  Stable . SBP goal < 160 mm Hg . Long-term BP goal normotensive . SBP currently running < 120 mm Hg  Hyperlipidemia  Home Lipid  lowering medication: none  LDL - not ordered, goal < 70  Current lipid lowering medication: none  Other Stroke Risk Factors  Advanced age  Former cigarette smoker - quit  ETOH use, advised to drink no more than 1 alcoholic beverage per day.  Hx of stroke 2012  St Jude mechanical aortic valve (coumadin)  Other Active Problems  St Jude mechanical aortic valve - coumadin reversed - will need to determine when it is safe to resume anticoagulation.  Delirium, sundowning- Improved after seroquel added. Re-orient as able and keep night/day cycles normal as possible.   Hospital day # 6 Patient is doing well neurologically.  Mobilize out of bed.  Continue ongoing therapies and likely transfer to inpatient rehab when bed available.  Discussed with rehab coordinator   Antony Contras, [place] Pager: [phone] 09/10/2019 3:33 PM  To contact Stroke Continuity provider, please refer to [email or web address]  After hours, contact General Neurology

## 2019-09-11 MED ORDER — PANTOPRAZOLE SODIUM 40 MG PO TBEC
40.0000 mg | DELAYED_RELEASE_TABLET | Freq: Every day | ORAL | Status: DC
  Administered 2019-09-11 – 2019-09-14 (×4): 40 mg via ORAL
  Filled 2019-09-11 (×4): qty 1

## 2019-09-11 MED ORDER — DIPHENHYDRAMINE HCL 25 MG PO CAPS: 25.0000 mg | ORAL_CAPSULE | Freq: Four times a day (QID) | ORAL | Status: DC | PRN

## 2019-09-11 MED ORDER — ASPIRIN EC 325 MG PO TBEC
325.0000 mg | DELAYED_RELEASE_TABLET | Freq: Every day | ORAL | Status: DC
  Administered 2019-09-12 – 2019-09-14 (×3): 325 mg via ORAL
  Filled 2019-09-11 (×3): qty 1

## 2019-09-11 NOTE — NC FL2 (Addendum)
[place] LEVEL OF CARE SCREENING TOOL     IDENTIFICATION  Patient Name: Virginia Franklin Birthdate: [birth year] Sex: female Admission Date (Current Location): 09/04/2019  [facility] [place] and [place] Number:  Herbalist and Address:  [facility], [place] [address], [place], [place] [address]      Provider Number: [id number]  Attending Physician Name and Address:  Garvin Fila, MD  Relative Name and Phone Number:       Current Level of Care: SNF Recommended Level of Care: [place] Prior Approval Number:    Date Approved/Denied:   PASRR Number:  manual review  Discharge Plan: SNF    Current Diagnoses: Patient Active Problem List   Diagnosis Date Noted  . ICH (intracerebral hemorrhage) ([place]) 09/04/2019  . Diverticulitis 03/29/2019  . Abdominal pain 03/29/2019  . Supratherapeutic INR 03/29/2019  . Kidney mass 03/29/2019  . Hyponatremia 03/29/2019  . Nausea 03/29/2019  . Constipation 03/29/2019  . Alzheimer's dementia ([place]) 01/05/2019  . Depression with anxiety 07/25/2018  . Altered mental status 12/16/2017  . Spells of speech arrest   . HTN (hypertension) 10/15/2017  . Dyslipidemia 10/15/2017  . Hx of completed stroke 10/15/2017  . Osteoarthritis 10/15/2017  . Encounter for therapeutic drug monitoring 09/13/2017  . Chronic anticoagulation 09/05/2017  . Aortic valve replaced 09/05/2017    Orientation RESPIRATION BLADDER Height & Weight     Self, Time, Situation, Place  Normal Continent Weight: 60.3 kg Height:  5\' 5"  (165.1 cm)  BEHAVIORAL SYMPTOMS/MOOD NEUROLOGICAL BOWEL NUTRITION STATUS      Continent Diet(heart healthy)  AMBULATORY STATUS COMMUNICATION OF NEEDS Skin   Extensive Assist Verbally Bruising(bilateral arms)                       Personal Care Assistance Level of Assistance  Bathing, Dressing, Feeding Bathing Assistance: Limited assistance Feeding assistance:  Independent Dressing Assistance: Limited assistance     Functional Limitations Info  Sight, Hearing, Speech Sight Info: Impaired Hearing Info: Adequate Speech Info: Adequate    SPECIAL CARE FACTORS FREQUENCY  PT (By licensed PT), OT (By licensed OT), Speech therapy     PT Frequency: 5x/wk OT Frequency: 5x/wk     Speech Therapy Frequency: 5x/wk      Contractures Contractures Info: Not present    Additional Factors Info  Code Status, Allergies, Psychotropic Code Status Info: Full Allergies Info: Penicillin Psychotropic Info: lexapro 5mg  PO daily/ Seroquel 12.5 mg PO at bedtime         Current Medications (09/11/2019):  This is the current hospital active medication list Current Facility-Administered Medications  Medication Dose Route Frequency Provider Last Rate Last Dose  . acetaminophen (TYLENOL) tablet 650 mg  650 mg Oral Q4H PRN Kerney Elbe, MD   650 mg at 09/10/19 [id number]   Or  . acetaminophen (TYLENOL) 160 MG/5ML solution 650 mg  650 mg Per Tube Q4H PRN Kerney Elbe, MD       Or  . acetaminophen (TYLENOL) suppository 650 mg  650 mg Rectal Q4H PRN Kerney Elbe, MD      . Chlorhexidine Gluconate Cloth 2 % PADS 6 each  6 each Topical Daily Kerney Elbe, MD   6 each at 09/07/19 0930  . darifenacin (ENABLEX) 24 hr tablet 7.5 mg  7.5 mg Oral Daily Kerney Elbe, MD   7.5 mg at 09/11/19 1010  . escitalopram (LEXAPRO) tablet 5 mg  5 mg Oral Daily Kerney Elbe, MD  5 mg at 09/11/19 1009  . furosemide (LASIX) tablet 20 mg  20 mg Oral Daily Kerney Elbe, MD   20 mg at 09/11/19 1010  . hydrALAZINE (APRESOLINE) injection 10 mg  10 mg Intravenous Q4H PRN Amie Portland, MD   10 mg at 09/08/19 1712  . labetalol (NORMODYNE) injection 10 mg  10 mg Intravenous Q2H PRN Amie Portland, MD      . lip balm (CARMEX) ointment   Topical PRN Kerney Elbe, MD      . lisinopril (ZESTRIL) tablet 20 mg  20 mg Oral Daily Rogue Jury, MD   20 mg at 09/11/19 1010  . pantoprazole  (PROTONIX) injection 40 mg  40 mg Intravenous QHS Kerney Elbe, MD   40 mg at 09/09/19 2150  . QUEtiapine (SEROQUEL) tablet 12.5 mg  12.5 mg Oral QHS Amie Portland, MD   12.5 mg at 09/10/19 2248  . senna-docusate (Senokot-S) tablet 1 tablet  1 tablet Oral BID Kerney Elbe, MD   1 tablet at 09/11/19 1009  . sodium chloride flush (NS) 0.9 % injection 3 mL  3 mL Intravenous Once Davonna Belling, MD         Discharge Medications: Please see discharge summary for a list of discharge medications.  Relevant Imaging Results:  Relevant Lab Results:   Additional Information SS#: [id number]  Pollie Friar, RN  I have personally obtained history,examined this patient, reviewed notes, independently viewed imaging studies, participated in medical decision making and plan of care.ROS completed by me personally and pertinent positives fully documented  I have made any additions or clarifications directly to the above note. Agree with note above.    Antony Contras, MD Medical Director [facility] St[facility] Pager: [phone] 09/11/2019 4:31 PM

## 2019-09-11 NOTE — Care Management Important Message (Signed)
Important Message  Patient Details  Name: Georgette Griesbach MRN: [id number] Date of Birth: [birth year]   Medicare Important Message Given:  Yes     Vann Okerlund 09/11/2019, 1:43 PM

## 2019-09-11 NOTE — Progress Notes (Addendum)
Inpatient Rehabilitation Admissions Coordinator  I have received an initial denial from insurance for inpt rehab admit. Dr. Leonie Man, pt and daughter made aware. I have arranged for peer to peer with insurance MD and Dr. Leonie Man to appeal.  Danne Baxter, RN, MSN Rehab Admissions Coordinator [phone] 09/11/2019 2:20 PM   I was just informed by Dr. Leonie Man that he received a denial for inpt rehab . I spoke with daughter by phone and she is aware. I will notify RN CM, Jacqualin Combes. We will sign off at this time.  Danne Baxter, RN, MSN Rehab Admissions Coordinator [phone] 09/11/2019 2:26 PM

## 2019-09-11 NOTE — Progress Notes (Signed)
STROKE TEAM PROGRESS NOTE    INTERVAL HISTORY No neurological changes.  . no complaints.  Vital signs stable.  Patient's insurance company has denied inpatient rehab but have approved rehab in a skilled nursing facility.  I spoke to patient's daughter over the phone she does not want the patient to go there.  She is willing to take the patient home to live with her but needs a few days to arrange for family to come and live with her to help her make this happen.  OBJECTIVE Vitals:   09/11/19 0418 09/11/19 0743 09/11/19 1009 09/11/19 1209  BP: (!) 109/59 108/69 (!) 121/55 (!) 119/57  Pulse: 70 73 79 68  Resp: 16 18  20   Temp: 98.6 F (37 C) 98.5 F (36.9 C)  98.4 F (36.9 C)  TempSrc: Oral Oral  Oral  SpO2: 91% 95%  95%  Weight:      Height:        CBC:  No results for input(s): WBC, NEUTROABS, HGB, HCT, MCV, PLT in the last 168 hours.  Basic Metabolic Panel:  No results for input(s): NA, K, CL, CO2, GLUCOSE, BUN, CREATININE, CALCIUM, MG, PHOS in the last 168 hours.  Lipid Panel:     Component Value Date/Time   CHOL 149 01/13/2018 1219   TRIG 63.0 01/13/2018 1219   HDL 72.40 01/13/2018 1219   CHOLHDL 2 01/13/2018 1219   VLDL 12.6 01/13/2018 1219   LDLCALC 64 01/13/2018 1219   HgbA1c:  Lab Results  Component Value Date   HGBA1C 5.5 12/17/2017   Urine Drug Screen: No results found for: LABOPIA, COCAINSCRNUR, LABBENZ, AMPHETMU, THCU, LABBARB  Alcohol Level No results found for: ETH  IMAGING  Ct Angio Head W Or Wo Contrast Ct Angio Neck W Or Wo Contrast 09/04/2019 IMPRESSION:   CTA neck:  The bilateral common carotid, internal carotid and vertebral arteries are patent within the neck without significant stenosis (50% or greater). Atherosclerotic disease within the major branch vessels of the neck, most notably as follows. Mild calcified plaque at the right carotid bifurcation and within the proximal right ICA. Plaque at the origin of the non dominant right vertebral  artery results in mild/moderate ostial stenosis.   CTA head:  1. No intracranial aneurysm is identified.  2. No large vessel occlusion or proximal high-grade arterial stenosis. Mild calcified plaque within the carotid artery siphons and intracranial vertebral arteries bilaterally.  3. 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 Correlate for history of chronic hypertension.   Dg Chest Port 1 View 09/04/2019 IMPRESSION:  No active cardiopulmonary disease.   Ct Head Code Stroke Wo Contrast 09/04/2019 IMPRESSION:  1. Acute hemorrhage involving the left lateral thalamus and posterior limb internal capsule. Estimated blood volume less than 1 mL.  2. Atrophy and chronic microvascular ischemia.   CT Head WO Contrast 09/05/2019 IMPRESSION: 1. Slight interval increase in size of an acute left thalamocapsular junction parenchymal hemorrhage, now measuring 6 x 12 x 22 mm (previously 6 x 12 x 18 mm). 2. Generalized parenchymal atrophy and chronic small vessel ischemic disease.   Transthoracic Echocardiogram  01/10/2018 Impressions: Normal LV size with mild LV hypertrophy. EF 60-65%. Normal RV size and systolic function. St Jude mechanical aortic valve was functioning normally.  ECG -not done EEG - not ordered   PHYSICAL EXAM Blood pressure (!) 119/57, pulse 68, temperature 98.4 F (36.9 C), temperature source Oral, resp. rate 20, height 5\' 5"  (1.651 m), weight 60.3 kg, SpO2 95 %. Frail pleasant  elderly Caucasian lady not in distress. Neurological Exam : A&Ox4, gets all info correct and appropriate in conversation today.  Fluent, C/N/R- intact. Face symmetric, tongue midline. EOMI. Mild weakness of intrinsic hand muscles on the right.  Diminished fine finger movements on the right.  Orbits left over right upper extremity., but otherwise 5/5 BUE and BLE.  Mild weakness of right hip flexors and ankle dorsiflexors.  Impaired right knee to heel coordination. Decreased sensation  right arm. Gait deferred  ASSESSMENT/PLAN [name] is a 85 y.o. female with history of mechanical heart valve replacement 50 years ago, on Coumadin, htn, RA and hx of stroke 2012 who presents to the ED with acute onset of confusion, right sided numbness, difficulty ambulating and speech difficulties. CT - small acute left thalamic hemorrhage. - INR 2.5 -> K-Centra and vitamin K.  She did not receive IV t-PA due to [place].  Acute hemorrhage involving the left lateral thalamus and posterior limb internal capsule - coumadin / hypertension.  Resultant  Right side numbness, weakness  Code Stroke CT Head - Acute hemorrhage involving the left lateral thalamus and posterior limb internal capsule. Estimated blood volume less than 1 mL. Atrophy and chronic microvascular ischemia.   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 Correlate for history of chronic hypertension.   CT Perfusion - not ordered  Carotid Doppler - CTA neck performed - carotid dopplers not indicated.  2D Echo - 01/10/2018 - Normal LV size with mild LV hypertrophy. EF 60-65%. Normal RV size and systolic function. St Jude mechanical aortic valve was functioning normally.  [employer] Virus 2  - negative  LDL - not ordered  HgbA1c - 5.5  UDS - not ordered  VTE prophylaxis - SCDs Diet  Diet Order            Diet Heart Room service appropriate? Yes; Fluid consistency: Thin  Diet effective now              warfarin daily prior to admission, now on No antithrombotic d/t bleed  Ongoing aggressive stroke risk factor management  Therapy recommendations:  CIR recommended; pre-cert started with insurance today.   Disposition: CIR in next 24h pending insurance  Hypertension  Home BP meds: Lasix ; Zestril  Current  BP meds: Lasix ; Zestril ; labetalol ; Cleviprex  Stable . SBP goal < 160 mm Hg . Long-term BP goal normotensive . SBP currently running < 120 mm Hg  Hyperlipidemia  Home Lipid lowering medication: none  LDL - not ordered, goal < 70  Current lipid lowering medication: none  Other Stroke Risk Factors  Advanced age  Former cigarette smoker - quit  ETOH use, advised to drink no more than 1 alcoholic beverage per day.  Hx of stroke 2012  St Jude mechanical aortic valve (coumadin)  Other Active Problems  St Jude mechanical aortic valve - coumadin reversed - will need to determine when it is safe to resume anticoagulation.  Delirium, sundowning- Improved after seroquel added. Re-orient as able and keep night/day cycles normal as possible.   Hospital day # 7 Patient is doing well neurologically.  Mobilize out of bed.  Continue ongoing therapies and likely DC home on Monday with home therapies as inpatient rehab has been denied.  Repeat CT scan of the head  on Monday and Dr. Erlinda Hong to make decision on resuming anticoagulation with warfarin given her mechanical heart valve. Discussed with rehab coordinator, social worker and patient's daughter.  Greater than 50% time during this 25-minute visit was spent on counseling and coordination of care and answering questions and discussion with care team   Antony Contras, MD Medical Director [place] Pager: [phone] 09/11/2019 1:57 PM  To contact Stroke Continuity provider, please refer to [email or web address] After hours, contact General Neurology

## 2019-09-12 ENCOUNTER — Inpatient Hospital Stay (HOSPITAL_COMMUNITY): Payer: Medicare HMO

## 2019-09-12 DIAGNOSIS — I610 Nontraumatic intracerebral hemorrhage in hemisphere, subcortical: Secondary | ICD-10-CM

## 2019-09-12 DIAGNOSIS — Z952 Presence of prosthetic heart valve: Secondary | ICD-10-CM

## 2019-09-12 DIAGNOSIS — Z7901 Long term (current) use of anticoagulants: Secondary | ICD-10-CM

## 2019-09-12 DIAGNOSIS — I10 Essential (primary) hypertension: Secondary | ICD-10-CM

## 2019-09-12 LAB — CBC
HCT: 37.1 % (ref 36.0–46.0)
Hemoglobin: 12.1 g/dL (ref 12.0–15.0)
MCH: 30.4 pg (ref 26.0–34.0)
MCHC: 32.6 g/dL (ref 30.0–36.0)
MCV: 93.2 fL (ref 80.0–100.0)
Platelets: 217 10*3/uL (ref 150–400)
RBC: 3.98 MIL/uL (ref 3.87–5.11)
RDW: 13.2 % (ref 11.5–15.5)
WBC: 4.3 10*3/uL (ref 4.0–10.5)
nRBC: 0 % (ref 0.0–0.2)

## 2019-09-12 LAB — BASIC METABOLIC PANEL
Anion gap: 11 (ref 5–15)
BUN: 33 mg/dL — ABNORMAL HIGH (ref 8–23)
CO2: 23 mmol/L (ref 22–32)
Calcium: 9.2 mg/dL (ref 8.9–10.3)
Chloride: 105 mmol/L (ref 98–111)
Creatinine, Ser: 1.02 mg/dL — ABNORMAL HIGH (ref 0.44–1.00)
GFR calc Af Amer: 57 mL/min — ABNORMAL LOW (ref >60)
GFR calc non Af Amer: 49 mL/min — ABNORMAL LOW (ref >60)
Glucose, Bld: 92 mg/dL (ref 70–99)
Potassium: 3.7 mmol/L (ref 3.5–5.1)
Sodium: 139 mmol/L (ref 135–145)

## 2019-09-12 NOTE — Progress Notes (Signed)
STROKE TEAM PROGRESS NOTE    INTERVAL HISTORY No neurological changes. Pt AAO x 3, accent vs. Mild dysarthria. CT repeat evolving left small thalamic ICH. On ASA 325. Pending d/c home in the next a couple of days.   OBJECTIVE Vitals:   09/11/19 2313 09/12/19 0415 09/12/19 0902 09/12/19 1335  BP: (!) 106/55 111/60 (!) 143/58 (!) 136/57  Pulse: 80 70 70 64  Resp: 18 16  16   Temp: 98.3 F (36.8 C) 98.1 F (36.7 C) 98.1 F (36.7 C) 98.4 F (36.9 C)  TempSrc: Axillary Oral Oral Oral  SpO2: 99% 99% 98% 98%  Weight:      Height:        CBC:  Recent Labs  Lab 09/12/19 0504  WBC 4.3  HGB 12.1  HCT 37.1  MCV 93.2  PLT [id number]    Basic Metabolic Panel:  Recent Labs  Lab 09/12/19 0504  NA 139  K 3.7  CL 105  CO2 23  GLUCOSE 92  BUN 33*  CREATININE 1.02*  CALCIUM 9.2    Lipid Panel:     Component Value Date/Time   CHOL 149 01/13/2018 1219   TRIG 63.0 01/13/2018 1219   HDL 72.40 01/13/2018 1219   CHOLHDL 2 01/13/2018 1219   VLDL 12.6 01/13/2018 1219   LDLCALC 64 01/13/2018 1219   HgbA1c:  Lab Results  Component Value Date   HGBA1C 5.5 12/17/2017   Urine Drug Screen: No results found for: LABOPIA, COCAINSCRNUR, LABBENZ, AMPHETMU, THCU, LABBARB  Alcohol Level No results found for: ETH  IMAGING  Ct Angio Head W Or Wo Contrast Ct Angio Neck W Or Wo Contrast 09/04/2019 IMPRESSION:   CTA neck:  The bilateral common carotid, internal carotid and vertebral arteries are patent within the neck without significant stenosis (50% or greater). Atherosclerotic disease within the major branch vessels of the neck, most notably as follows. Mild calcified plaque at the right carotid bifurcation and within the proximal right ICA. Plaque at the origin of the non dominant right vertebral artery results in mild/moderate ostial stenosis.   CTA head:  1. No intracranial aneurysm is identified.  2. No large vessel occlusion or proximal high-grade arterial stenosis. Mild calcified  plaque within the carotid artery siphons and intracranial vertebral arteries bilaterally.  3. 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 Correlate for history of chronic hypertension.   Dg Chest Port 1 View 09/04/2019 IMPRESSION:  No active cardiopulmonary disease.   Ct Head Code Stroke Wo Contrast 09/04/2019 IMPRESSION:  1. Acute hemorrhage involving the left lateral thalamus and posterior limb internal capsule. Estimated blood volume less than 1 mL.  2. Atrophy and chronic microvascular ischemia.   CT Head WO Contrast 09/05/2019 IMPRESSION: 1. Slight interval increase in size of an acute left thalamocapsular junction parenchymal hemorrhage, now measuring 6 x 12 x 22 mm (previously 6 x 12 x 18 mm). 2. Generalized parenchymal atrophy and chronic small vessel ischemic disease.  CT Head WO Contrast 09/12/2019 IMPRESSION: 1. Unchanged size of intraparenchymal hematoma of the dorsolateral left thalamus. 2. No new site of hemorrhage. 3. Chronic microvascular ischemia and generalized atrophy.  Transthoracic Echocardiogram  09/08/19 1. Left ventricular ejection fraction, by visual estimation, is 60 to 65%. The left ventricle has normal function. Normal left ventricular size. There is mildly increased left ventricular hypertrophy.  2. Abnormal septal motion consistent with post-operative status.  3. Left ventricular diastolic Doppler parameters are consistent with impaired relaxation pattern of LV diastolic filling.  4. Global right ventricle  has normal systolic function.The right ventricular size is normal. No increase in right ventricular wall thickness.  5. Left atrial size was normal.  6. Right atrial size was normal.  7. Mild mitral annular calcification.  8. The mitral valve is normal in structure. No evidence of mitral valve regurgitation.  9. The tricuspid valve is grossly normal. Tricuspid valve regurgitation was not visualized by color flow Doppler. 10.  Bileaflet mechanical prosthesis in the aortic valve position. 11. Aortic valve regurgitation is trivial ("physiological" backwash) by color flow Doppler. 12. Aortic prosthesis gradients are unchanged since 2019. 13. The pulmonic valve was not well visualized. Pulmonic valve regurgitation is not visualized by color flow Doppler. 14. TR signal is inadequate for assessing pulmonary artery systolic pressure. 15. The inferior vena cava is normal in size with greater than 50% respiratory variability, suggesting right atrial pressure of 3 mmHg.   PHYSICAL EXAM  Temp:  [97.7 F (36.5 C)-98.6 F (37 C)] 98.6 F (37 C) (10/31 1745) Pulse Rate:  [62-85] 62 (10/31 1803) Resp:  [16-18] 18 (10/31 1745) BP: (106-143)/(55-74) 127/55 (10/31 1803) SpO2:  [97 %-99 %] 97 % (10/31 1745)  General - Well nourished, well developed, in no apparent distress.  Ophthalmologic - fundi not visualized due to noncooperation.  Cardiovascular - Regular rhythm and rate.  Mental Status -  Level of arousal and orientation to time, place, and person were intact. Language including expression, naming, repetition, comprehension was assessed and found intact. Accent vs. Mild dysarthria  Cranial Nerves II - XII - II - Visual field intact OU. III, IV, VI - Extraocular movements intact. V - Facial sensation intact bilaterally. VII - Facial movement intact bilaterally. VIII - Hearing & vestibular intact bilaterally. X - Palate elevates symmetrically, accent vs. Mild dysarthria. XI - Chin turning & shoulder shrug intact bilaterally. XII - Tongue protrusion intact.  Motor Strength - The patient's strength was normal in all extremities and pronator drift was absent.  Bulk was normal and fasciculations were absent.   Motor Tone - Muscle tone was assessed at the neck and appendages and was normal.  Reflexes - The patient's reflexes were symmetrical in all extremities and she had no pathological reflexes.  Sensory - Light  touch, temperature/pinprick were assessed and were symmetrical.    Coordination - The patient had normal movements in the hands with no ataxia or dysmetria.  Tremor was absent.  Gait and Station - deferred.   ASSESSMENT/PLAN Ms. Khyrie Doleman Rosselli is a 85 y.o. female with history of mechanical heart valve replacement 50 years ago, on Coumadin, htn, RA and hx of stroke 2012 who presents to the ED with acute onset of confusion, right sided numbness, difficulty ambulating and speech difficulties. CT - small acute left thalamic hemorrhage. - INR 2.5 -> K-Centra and vitamin K.  She did not receive IV t-PA due to [place].  ICH - small left lateral thalamus and posterior limb internal capsule ICH, likely due to HTN in the setting of coumadin use.  Code Stroke CT Head - Acute hemorrhage involving the left lateral thalamus and posterior limb internal capsule.   CT head 09/05/19 - Slight interval increase of left thalamocapsular junction ICH  CTA H&N - Dolichoectasia of the anterior and posterior circulation. Correlate for history of chronic hypertension. No AVM or aneurysm  CT Head - 09/12/2019 - Unchanged size of intraparenchymal hematoma of the dorsolateral left thalamus. No new site of hemorrhage. Chronic microvascular ischemia and generalized atrophy.  2D Echo - EF 60-65%.  Normal RV size and systolic function. St Jude mechanical aortic valve was functioning normally.  [employer] Virus 2  - negative  LDL - not ordered  HgbA1c - 5.5  VTE prophylaxis - SCDs  warfarin daily prior to admission, now on ASA 325mg . Plan to resume coumadin next Monday with ASA bridge. Recommend INR goal 2.5-3.0. once INR at goal, ASA can be discontinued.   Ongoing aggressive stroke risk factor management  Therapy recommendations:  CIR recommended, but declined by insurance. Daughter may take pt home instead of SNF.  Disposition: home pending  Mechanical Aortic valve  St Jude mechanical aortic valve, on  coumadin PTA with INR goal 2.5-3.5  coumadin reversed with Eppie Gibson and VitK on admission with INR 2.5  On ASA 325  will resume coumadin next Monday with ASA bridge as above.  Hypertension  Home BP meds: Lasix ; Zestril  Current BP meds: Lasix ; Zestril  Stable . SBP goal < 160 mm Hg . Long-term BP goal normotensive  Other Stroke Risk Factors  Advanced age  Former cigarette smoker - quit  ETOH use, advised to drink no more than 1 alcoholic beverage per day.  Hx of stroke 2012  Other Active Problems  Delirium, sundowning- Improved after seroquel added. Re-orient as able and keep night/day cycles normal as possible.    Hospital day # 8  Rosalin Hawking, MD PhD Stroke Neurology 09/12/2019 7:10 PM   To contact Stroke Continuity provider, please refer to [email or web address] After hours, contact General Neurology

## 2019-09-13 LAB — RENAL FUNCTION PANEL
Albumin: 3.3 g/dL — ABNORMAL LOW (ref 3.5–5.0)
Anion gap: 13 (ref 5–15)
BUN: 20 mg/dL (ref 8–23)
CO2: 22 mmol/L (ref 22–32)
Calcium: 9.4 mg/dL (ref 8.9–10.3)
Chloride: 103 mmol/L (ref 98–111)
Creatinine, Ser: 0.7 mg/dL (ref 0.44–1.00)
GFR calc Af Amer: >60 mL/min (ref >60)
GFR calc non Af Amer: >60 mL/min (ref >60)
Glucose, Bld: 102 mg/dL — ABNORMAL HIGH (ref 70–99)
Phosphorus: 3.2 mg/dL (ref 2.5–4.6)
Potassium: 4 mmol/L (ref 3.5–5.1)
Sodium: 138 mmol/L (ref 135–145)

## 2019-09-13 LAB — PROTIME-INR
INR: 1 (ref 0.8–1.2)
Prothrombin Time: 12.6 seconds (ref 11.4–15.2)

## 2019-09-13 MED ORDER — WARFARIN - PHARMACIST DOSING INPATIENT
Freq: Every day | Status: DC
  Administered 2019-09-13: 18:00:00

## 2019-09-13 MED ORDER — WARFARIN SODIUM 7.5 MG PO TABS
7.5000 mg | ORAL_TABLET | Freq: Once | ORAL | Status: AC
  Administered 2019-09-13: 18:00:00 7.5 mg via ORAL
  Filled 2019-09-13: qty 1

## 2019-09-13 NOTE — Discharge Summary (Addendum)
Patient ID: Virginia Franklin    l   MRN: [id number]      DOB: [birth year]  Date of Admission: 09/04/2019 Date of Discharge: 09/14/2019  Attending Physician:  Rosalin Hawking, MD, Stroke MD Consultant(s):   IP Rehab admission Patient's PCP:  [name], MD  DISCHARGE DIAGNOSIS:  Principal Problem:   Acute spont intraparenchymal hemorrhage assoc w/ coagulopathy ([place]) L lateral thalamus and PLIC on warfarin Active Problems:   Chronic anticoagulation   Aortic valve replaced   HTN (hypertension)   Dyslipidemia   Hx of completed stroke   Altered mental status   Alzheimer's dementia [place] Franklin)  Past Medical History:  Diagnosis Date  . Body mass index (BMI) of 22.0-22.9 in adult   . Cervical cancer ([place])   . H/O cervical fracture   . H/O osteoporosis   . H/O rheumatoid arthritis   . H/O: CVA (cerebrovascular accident) 2012  . H/O: osteoarthritis   . History of urinary frequency   . Hx of diverticulitis of colon   . Hypertension    Past Surgical History:  Procedure Laterality Date  . ABDOMINAL HYSTERECTOMY    . CARDIAC SURGERY    . CATARACT EXTRACTION    . SMALL INTESTINE SURGERY      Family History Family History  Problem Relation Age of Onset  . Kidney disease Mother        MALIGNANT NEOPLASM  . Arthritis Father   . Heart Problems Father   . Lung disease Brother   . Heart disease Neg Hx     Social History  reports that she has quit smoking. She has never used smokeless tobacco. She reports current alcohol use. She reports that she does not use drugs.  Allergies as of 09/14/2019      Reactions   Penicillins Anaphylaxis, Swelling   Has patient had a PCN reaction causing immediate rash, facial/tongue/throat swelling, SOB or lightheadedness with hypotension: Yes Has patient had a PCN reaction causing severe rash involving mucus membranes or skin necrosis: No Has patient had a PCN reaction that required hospitalization: No Has patient had a PCN reaction occurring within  the last 10 years: No If all of the above answers are "NO", then may proceed with Cephalosporin use.      Medication List    STOP taking these medications   nystatin 100000 UNIT/ML suspension Commonly known as: MYCOSTATIN     TAKE these medications   aspirin 325 MG EC tablet Take 1 tablet (325 mg total) by mouth daily. STOP once your warfarin is therapeutic, INR 2.5.-3.0 Start taking on: September 15, 2019   escitalopram 5 MG tablet Commonly known as: LEXAPRO Take 1 tablet (5 mg total) by mouth daily.   ezetimibe-simvastatin 10-40 MG tablet Commonly known as: VYTORIN Take 1 tablet by mouth daily.   furosemide 20 MG tablet Commonly known as: LASIX TAKE 1 TABLET DAILY   lisinopril 20 MG tablet Commonly known as: ZESTRIL Take 1 tablet (20 mg total) by mouth daily. Start taking on: September 15, 2019 What changed:   medication strength  how much to take   memantine 10 MG tablet Commonly known as: NAMENDA Take 10 mg by mouth 2 (two) times daily.   QUEtiapine 25 MG tablet Commonly known as: SEROQUEL Take 0.5 tablets (12.5 mg total) by mouth at bedtime.   solifenacin 5 MG tablet Commonly known as: VESIcare Take 1 tablet (5 mg total) by mouth daily.   warfarin 7.5 MG tablet Commonly known as: COUMADIN Take  as directed. If you are unsure how to take this medication, talk to your nurse or doctor. Original instructions: Take 0.5-1 tablets (3.75-7.5 mg total) by mouth See admin instructions. At d/c from [facility] 11/2 - take 7.5 daily at 6p. Next lab draw 11/4. Adjust dose per coumadin clinic What changed: additional instructions            Durable Medical Equipment  (From admission, onward)         Start     Ordered   09/14/19 0826  For home use only DME lightweight manual wheelchair with seat cushion  Once    Comments: Patient suffers from [place] which impairs their ability to perform daily activities like bathing, walking in the home.  A walker will not resolve   issue with performing activities of daily living. A wheelchair will allow patient to safely perform daily activities. Patient is not able to propel themselves in the home using a standard weight wheelchair due to weakness. Patient can self propel in the lightweight wheelchair. Length of need lifetime. Accessories: elevating leg rests (ELRs), wheel locks, extensions and anti-tippers.   09/14/19 0827   09/14/19 0825  For home use only DME 3 n 1  Once     09/14/19 0825          HOME MEDICATIONS PRIOR TO ADMISSION Medications Prior to Admission  Medication Sig Dispense Refill  . escitalopram (LEXAPRO) 5 MG tablet Take 1 tablet (5 mg total) by mouth daily. 90 tablet 3  . ezetimibe-simvastatin (VYTORIN) 10-40 MG tablet Take 1 tablet by mouth daily.    . furosemide (LASIX) 20 MG tablet TAKE 1 TABLET DAILY (Patient taking differently: Take 20 mg by mouth daily. ) 90 tablet 3  . lisinopril (ZESTRIL) 10 MG tablet TAKE 1 TABLET DAILY (Patient taking differently: Take 10 mg by mouth daily. ) 90 tablet 3  . memantine (NAMENDA) 10 MG tablet Take 10 mg by mouth 2 (two) times daily.    . [DISCONTINUED] warfarin (COUMADIN) 7.5 MG tablet Take 3.75-7.5 mg by mouth See admin instructions. 3.75mg  on TUES and FRI, 7.5mg  on all other days.    Marland Kitchen nystatin (MYCOSTATIN) 100000 UNIT/ML suspension Take 5 mLs (500,000 Units total) by mouth 4 (four) times daily. (Patient not taking: Reported on 09/04/2019) 200 mL 0  . solifenacin (VESICARE) 5 MG tablet Take 1 tablet (5 mg total) by mouth daily. (Patient not taking: Reported on 09/07/2019) 90 tablet 3     [facility] MEDICATIONS . aspirin EC  325 mg Oral Daily  . darifenacin  7.5 mg Oral Daily  . escitalopram  5 mg Oral Daily  . furosemide  20 mg Oral Daily  . lisinopril  20 mg Oral Daily  . pantoprazole  40 mg Oral Daily  . QUEtiapine  12.5 mg Oral QHS  . senna-docusate  1 tablet Oral BID  . sodium chloride flush  3 mL Intravenous Once  . Warfarin - Pharmacist  Dosing Inpatient   Does not apply q1800    LABORATORY STUDIES CBC    Component Value Date/Time   WBC 3.9 (L) 09/14/2019 0332   RBC 4.03 09/14/2019 0332   HGB 12.1 09/14/2019 0332   HCT 36.6 09/14/2019 0332   PLT 227 09/14/2019 0332   MCV 90.8 09/14/2019 0332   MCH 30.0 09/14/2019 0332   MCHC 33.1 09/14/2019 0332   RDW 12.6 09/14/2019 0332   LYMPHSABS 1.3 09/04/2019 1320   MONOABS 0.4 09/04/2019 1320   EOSABS 0.0 09/04/2019 1320  BASOSABS 0.0 09/04/2019 1320   CMP    Component Value Date/Time   NA 138 09/14/2019 0332   K 3.7 09/14/2019 0332   CL 105 09/14/2019 0332   CO2 24 09/14/2019 0332   GLUCOSE 89 09/14/2019 0332   BUN 16 09/14/2019 0332   CREATININE 0.77 09/14/2019 0332   CALCIUM 8.9 09/14/2019 0332   PROT 7.5 09/04/2019 1320   ALBUMIN 3.3 (L) 09/13/2019 0844   AST 36 09/04/2019 1320   ALT 20 09/04/2019 1320   ALKPHOS 77 09/04/2019 1320   BILITOT 0.8 09/04/2019 1320   GFRNONAA >60 09/14/2019 0332   GFRAA >60 09/14/2019 0332   COAGS Lab Results  Component Value Date   INR 1.0 09/14/2019   INR 1.0 09/13/2019   INR 0.9 09/05/2019   Urinalysis    Component Value Date/Time   COLORURINE YELLOW 03/29/2019 1555   APPEARANCEUR CLEAR 03/29/2019 1555   LABSPEC 1.010 03/29/2019 1555   PHURINE 6.0 03/29/2019 1555   GLUCOSEU NEGATIVE 03/29/2019 1555   HGBUR MODERATE (A) 03/29/2019 [place] 03/29/2019 1555   BILIRUBINUR Negative 06/17/2018 [place] 03/29/2019 1555   PROTEINUR NEGATIVE 03/29/2019 1555   UROBILINOGEN 0.2 06/17/2018 1756   NITRITE NEGATIVE 03/29/2019 1555   LEUKOCYTESUR NEGATIVE 03/29/2019 1555    SIGNIFICANT DIAGNOSTIC STUDIES  Ct Head Code Stroke Wo Contrast 09/04/2019 I1. Acute hemorrhage involving the left lateral thalamus and posterior limb internal capsule. Estimated blood volume less than 1 mL.  2. Atrophy and chronic microvascular ischemia.   Ct Angio Neck W Or Wo Contrast 09/04/2019 The  bilateral common carotid, internal carotid and vertebral arteries are patent within the neck without significant stenosis (50% or greater). Atherosclerotic disease within the major branch vessels of the neck, most notably as follows. Mild calcified plaque at the right carotid bifurcation and within the proximal right ICA. Plaque at the origin of the non dominant right vertebral artery results in mild/moderate ostial stenosis.   Ct Angio Head W Or Wo Contrast 09/04/2019 1. No intracranial aneurysm is identified.  2. No large vessel occlusion or proximal high-grade arterial stenosis. Mild calcified plaque within the carotid artery siphons and intracranial vertebral arteries bilaterally.  3. 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 Correlate for history of chronic hypertension.   Dg Chest Port 1 View 09/04/2019 No active cardiopulmonary disease.   CT Head WO Contrast 09/05/2019 1. Slight interval increase in size of an acute left thalamocapsular junction parenchymal hemorrhage, now measuring 6 x 12 x 22 mm (previously 6 x 12 x 18 mm). 2. Generalized parenchymal atrophy and chronic small vessel ischemic disease.  CT Head WO Contrast 09/12/2019 1. Unchanged size of intraparenchymal hematoma of the dorsolateral left thalamus. 2. No new site of hemorrhage. 3. Chronic microvascular ischemia and generalized atrophy.  Transthoracic Echocardiogram  09/08/19 1. Left ventricular ejection fraction, by visual estimation, is 60 to 65%. The left ventricle has normal function. Normal left ventricular size. There is mildly increased left ventricular hypertrophy. 2. Abnormal septal motion consistent with post-operative status. 3. Left ventricular diastolic Doppler parameters are consistent with impaired relaxation pattern of LV diastolic filling. 4. Global right ventricle has normal systolic function.The right ventricular size is normal. No increase in right ventricular wall  thickness. 5. Left atrial size was normal. 6. Right atrial size was normal. 7. Mild mitral annular calcification. 8. The mitral valve is normal in structure. No evidence of mitral valve regurgitation. 9. The tricuspid valve is grossly normal. Tricuspid valve  regurgitation was not visualized by color flow Doppler. 10. Bileaflet mechanical prosthesis in the aortic valve position. 11. Aortic valve regurgitation is trivial ("physiological" backwash) by color flow Doppler. 12. Aortic prosthesis gradients are unchanged since 2019. 13. The pulmonic valve was not well visualized. Pulmonic valve regurgitation is not visualized by color flow Doppler. 14. TR signal is inadequate for assessing pulmonary artery systolic pressure. 15. The inferior vena cava is normal in size with greater than 50% respiratory variability, suggesting right atrial pressure of 3 mmHg.    HISTORY OF PRESENT ILLNESS (from H&P Dr Cheral Marker 09/04/2019) Virginia Franklin is an 85 y.o. female independent living resident with a history of mechanical heart valve replacement 50 years ago, on Coumadin, who presents to the ED with acute onset of confusion, right sided numbness and difficulty ambulating. Symptoms began at 12:30 PM while on the telephone with her daughter. She suddenly had difficulty with speech, her RUE and RLE felt "strange" and "numb", and she had difficulty ambulating. Her daughter apparently was still on the phone with the patient, noticed that something was wrong and called the facility. Facility staff called EMS who noted difficulty with speech and right sided motor impairment on scene. She states that she does not have a headache and did not have any vision changes or neurological symptoms other than those described above. Emergent CT head was obtained on arrival to the ED, revealing a small acute left thalamic hemorrhage.  INR was 2.5. K-Centra and Vitamin K were ordered.   [facility] COURSE Ms. Zanyla Jorde  Franklin is a 85 y.o. female with history of mechanical heart valve replacement 50 years ago, on Coumadin, HTN, RA and hx of stroke 2012 who presents to the ED with acute onset of confusion, right sided numbness, difficulty ambulating and speech difficulties. CT - small acute left thalamic hemorrhage. - INR 2.5 -> K-Centra and vitamin K.  She did not receive IV t-PA due to [place].  ICH - small left lateral thalamus and posterior limb internal capsule ICH, likely due to HTN in the setting of coumadin use (coagulopathy)  Code Stroke CT Head - Acute hemorrhage involving the left lateral thalamus and posterior limb internal capsule.   CT head 09/05/19 - Slight interval increase of left thalamocapsular junction ICH  CTA H&N - Dolichoectasia of the anterior and posterior circulation. Correlate for history of chronic hypertension. No AVM or aneurysm  CT Head - 09/12/2019 - Unchanged size of intraparenchymal hematoma of the dorsolateral left thalamus. No new site of hemorrhage. Chronic microvascular ischemia and generalized atrophy.  2D Echo - EF 60-65%. Normal RV size and systolic function. St Jude mechanical aortic valve was functioning normally.  [employer] Virus 2  - negative  Warfarin daily prior to admission, now on coumadin with ASA 325mg  bridge. Recommend INR goal 2.5-3.0. once INR at goal, discontinue aspirin.   Ongoing aggressive stroke risk factor management  Therapy recommendations:  CIR recommended, but denied by insurance with recommendations for SNF.  Daughter opted to take pt home with [facility] instead of SNF.  Disposition: home with daughter  Home health PT, OT, RN, NA, SW. DME wheelchair.  Mechanical Aortic valve  St Jude mechanical aortic valve, on coumadin PTA with INR goal 2.5-3.5  Coumadin reversed with Eppie Gibson and VitK on admission with INR 2.5  On ASA 325  Resumed coumadin 09/13/2019 with ASA bridge.  Discontinue aspirin once INR at recommended goal of  2.5-3.0  Hypertension  Home BP meds: Lasix ; Zestril  Current  BP meds: Lasix ; Zestril  Stable  BP goal normotensive  Hyperlipidemia  On Vytorin PTA  Held IP d/t hemorrhage   Resume at d/c  Other Stroke Risk Factors  Advanced age  Former cigarette smoker - quit  ETOH use, advised to drink no more than 1 alcoholic beverage per day.  Hx of stroke 2012  Other Active Problems  Delirium, sundowning- Improved after seroquel added. Re-orient as able and keep night/day cycles as normal as possible.    DISCHARGE EXAM Vitals:   09/13/19 2000 09/14/19 0044 09/14/19 0419 09/14/19 0839  BP: 134/61 135/62 (!) 146/60 (!) 119/57  Pulse: 63 60 (!) 59   Resp: 16 18 17 12   Temp: 98.1 F (36.7 C) 98 F (36.7 C) 98 F (36.7 C) 97.9 F (36.6 C)  TempSrc: Oral  Oral Oral  SpO2: 96% 97% 99% 98%  Weight:      Height:       General - Well nourished, well developed, in no apparent distress.  Ophthalmologic - fundi not visualized due to noncooperation.  Cardiovascular - Regular rhythm and rate.  Mental Status -  Level of arousal and orientation to time, place, and person were intact. Language including expression, naming, repetition, comprehension was assessed and found intact. Accent vs. Mild dysarthria  Cranial Nerves II - XII - II - Visual field intact OU. III, IV, VI - Extraocular movements intact. V - Facial sensation intact bilaterally. VII - Facial movement intact bilaterally. VIII - Hearing & vestibular intact bilaterally. X - Palate elevates symmetrically, accent vs. Mild dysarthria. XI - Chin turning & shoulder shrug intact bilaterally. XII - Tongue protrusion intact.  Motor Strength - The patient's strength was normal in all extremities and pronator drift was absent.  Bulk was normal and fasciculations were absent.   Motor Tone - Muscle tone was assessed at the neck and appendages and was normal.  Reflexes - The patient's reflexes were symmetrical in  all extremities and she had no pathological reflexes.  Sensory - Light touch, temperature/pinprick were assessed and were symmetrical.    Coordination - The patient had normal movements in the hands with no ataxia or dysmetria.  Tremor was absent.  Gait and Station - deferred.  Discharge Diet   Heart healthy thin liquids  DISCHARGE PLAN  Disposition:  D/c home with daughter  DME:  wheelchair   Home health:  PT, OT, RN, NA, SW  warfarin daily with aspirin bridge; goal INR 2/5-3.0. discontinue aspirin once at goal. Next lab draw on Wednesday 09/16/2019 by [facility] RN. Results to [facility] (as prior to admission)    Ongoing risk factor control by Primary Care Physician at time of discharge  Follow-up [name], MD in 2 weeks.  Follow-up in [facility] in 4 weeks, office to schedule an appointment.   40 minutes were spent preparing discharge.  Rosalin Hawking, MD PhD Stroke Neurology 09/14/2019 5:28 PM

## 2019-09-13 NOTE — Progress Notes (Signed)
ANTICOAGULATION CONSULT NOTE - Initial Consult  Pharmacy Consult for Warfarin Indication: Mechanial heart valve  Allergies  Allergen Reactions  . Penicillins Anaphylaxis and Swelling    Has patient had a PCN reaction causing immediate rash, facial/tongue/throat swelling, SOB or lightheadedness with hypotension: Yes Has patient had a PCN reaction causing severe rash involving mucus membranes or skin necrosis: No Has patient had a PCN reaction that required hospitalization: No Has patient had a PCN reaction occurring within the last 10 years: No If all of the above answers are "NO", then may proceed with Cephalosporin use.    Patient Measurements: Height: 5\' 5"  (165.1 cm) Weight: 132 lb 15 oz (60.3 kg) IBW/kg (Calculated) : 57   Vital Signs: Temp: 98.8 F (37.1 C) (11/01 1200) Temp Source: Oral (11/01 0830) BP: 135/58 (11/01 1200) Pulse Rate: 72 (11/01 1200)  Labs: Recent Labs    09/12/19 0504 09/13/19 0844  HGB 12.1  --   HCT 37.1  --   PLT 217  --   CREATININE 1.02* 0.70    Estimated Creatinine Clearance: 43.7 mL/min (by C-G formula based on SCr of 0.7 mg/dL).   Medical History: Past Medical History:  Diagnosis Date  . Body mass index (BMI) of 22.0-22.9 in adult   . Cervical cancer ([place])   . H/O cervical fracture   . H/O osteoporosis   . H/O rheumatoid arthritis   . H/O: CVA (cerebrovascular accident) 2012  . H/O: osteoarthritis   . History of urinary frequency   . Hx of diverticulitis of colon   . Hypertension     Assessment: Patient had an [place] this admission likely in the setting of HTN and coumadin use. We are now restarting her PTA warfarin with an aspirin bridge per neurology. Her home dose of warfarin is 3.75 on Tuesday/Friday and 7.5 all other days of the week. I will order an INR to be drawn before her dose so we have a baseline and then have a standing daily INR thereafter. Once the patient is within the goal range of 2.5-3.5 the aspirin 325 may be  stopped.   Goal of Therapy:  INR 2.5-3.5 Monitor platelets by anticoagulation protocol: Yes   Plan:  Warfarin 7.5 mg x1 dose tonight  Continue ASA until INR therapeutic  Daily CBC and INR  Marliss Czar Giovany Cosby 09/13/2019,1:56 PM

## 2019-09-13 NOTE — Progress Notes (Signed)
STROKE TEAM PROGRESS NOTE    INTERVAL HISTORY RN at bedside. No neurological changes. On ASA 325. Will restart coumadin today.   OBJECTIVE Vitals:   09/12/19 2000 09/13/19 0000 09/13/19 0346 09/13/19 0830  BP: 110/69 (!) 120/54 (!) 143/66 131/73  Pulse: (!) 57 61 61 69  Resp: 18 17 18 16   Temp: 98 F (36.7 C) 98.1 F (36.7 C) 98.1 F (36.7 C) 98.6 F (37 C)  TempSrc: Oral Oral Oral Oral  SpO2: 95% 97% 95% 99%  Weight:      Height:        CBC:  Recent Labs  Lab 09/12/19 0504  WBC 4.3  HGB 12.1  HCT 37.1  MCV 93.2  PLT [id number]    Basic Metabolic Panel:  Recent Labs  Lab 09/12/19 0504 09/13/19 0844  NA 139 138  K 3.7 4.0  CL 105 103  CO2 23 22  GLUCOSE 92 102*  BUN 33* 20  CREATININE 1.02* 0.70  CALCIUM 9.2 9.4  PHOS  --  3.2    Lipid Panel:     Component Value Date/Time   CHOL 149 01/13/2018 1219   TRIG 63.0 01/13/2018 1219   HDL 72.40 01/13/2018 1219   CHOLHDL 2 01/13/2018 1219   VLDL 12.6 01/13/2018 1219   LDLCALC 64 01/13/2018 1219   HgbA1c:  Lab Results  Component Value Date   HGBA1C 5.5 12/17/2017   Urine Drug Screen: No results found for: LABOPIA, COCAINSCRNUR, LABBENZ, AMPHETMU, THCU, LABBARB  Alcohol Level No results found for: ETH  IMAGING  Ct Angio Head W Or Wo Contrast Ct Angio Neck W Or Wo Contrast 09/04/2019 IMPRESSION:   CTA neck:  The bilateral common carotid, internal carotid and vertebral arteries are patent within the neck without significant stenosis (50% or greater). Atherosclerotic disease within the major branch vessels of the neck, most notably as follows. Mild calcified plaque at the right carotid bifurcation and within the proximal right ICA. Plaque at the origin of the non dominant right vertebral artery results in mild/moderate ostial stenosis.   CTA head:  1. No intracranial aneurysm is identified.  2. No large vessel occlusion or proximal high-grade arterial stenosis. Mild calcified plaque within the carotid artery  siphons and intracranial vertebral arteries bilaterally.  3. 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 Correlate for history of chronic hypertension.   Dg Chest Port 1 View 09/04/2019 IMPRESSION:  No active cardiopulmonary disease.   Ct Head Code Stroke Wo Contrast 09/04/2019 IMPRESSION:  1. Acute hemorrhage involving the left lateral thalamus and posterior limb internal capsule. Estimated blood volume less than 1 mL.  2. Atrophy and chronic microvascular ischemia.   CT Head WO Contrast 09/05/2019 IMPRESSION: 1. Slight interval increase in size of an acute left thalamocapsular junction parenchymal hemorrhage, now measuring 6 x 12 x 22 mm (previously 6 x 12 x 18 mm). 2. Generalized parenchymal atrophy and chronic small vessel ischemic disease.  CT Head WO Contrast 09/12/2019 IMPRESSION: 1. Unchanged size of intraparenchymal hematoma of the dorsolateral left thalamus. 2. No new site of hemorrhage. 3. Chronic microvascular ischemia and generalized atrophy.  Transthoracic Echocardiogram  09/08/19 1. Left ventricular ejection fraction, by visual estimation, is 60 to 65%. The left ventricle has normal function. Normal left ventricular size. There is mildly increased left ventricular hypertrophy.  2. Abnormal septal motion consistent with post-operative status.  3. Left ventricular diastolic Doppler parameters are consistent with impaired relaxation pattern of LV diastolic filling.  4. Global right ventricle has normal  systolic function.The right ventricular size is normal. No increase in right ventricular wall thickness.  5. Left atrial size was normal.  6. Right atrial size was normal.  7. Mild mitral annular calcification.  8. The mitral valve is normal in structure. No evidence of mitral valve regurgitation.  9. The tricuspid valve is grossly normal. Tricuspid valve regurgitation was not visualized by color flow Doppler. 10. Bileaflet mechanical prosthesis in the  aortic valve position. 11. Aortic valve regurgitation is trivial ("physiological" backwash) by color flow Doppler. 12. Aortic prosthesis gradients are unchanged since 2019. 13. The pulmonic valve was not well visualized. Pulmonic valve regurgitation is not visualized by color flow Doppler. 14. TR signal is inadequate for assessing pulmonary artery systolic pressure. 15. The inferior vena cava is normal in size with greater than 50% respiratory variability, suggesting right atrial pressure of 3 mmHg.   PHYSICAL EXAM  Temp:  [98 F (36.7 C)-98.6 F (37 C)] 98.6 F (37 C) (11/01 0830) Pulse Rate:  [57-69] 69 (11/01 0830) Resp:  [16-18] 16 (11/01 0830) BP: (110-143)/(54-73) 131/73 (11/01 0830) SpO2:  [95 %-99 %] 99 % (11/01 0830)  General - Well nourished, well developed, in no apparent distress.  Ophthalmologic - fundi not visualized due to noncooperation.  Cardiovascular - Regular rhythm and rate.  Mental Status -  Level of arousal and orientation to time, place, and person were intact. Language including expression, naming, repetition, comprehension was assessed and found intact. Accent vs. Mild dysarthria  Cranial Nerves II - XII - II - Visual field intact OU. III, IV, VI - Extraocular movements intact. V - Facial sensation intact bilaterally. VII - Facial movement intact bilaterally. VIII - Hearing & vestibular intact bilaterally. X - Palate elevates symmetrically, accent vs. Mild dysarthria. XI - Chin turning & shoulder shrug intact bilaterally. XII - Tongue protrusion intact.  Motor Strength - The patient's strength was normal in all extremities and pronator drift was absent.  Bulk was normal and fasciculations were absent.   Motor Tone - Muscle tone was assessed at the neck and appendages and was normal.  Reflexes - The patient's reflexes were symmetrical in all extremities and she had no pathological reflexes.  Sensory - Light touch, temperature/pinprick were assessed  and were symmetrical.    Coordination - The patient had normal movements in the hands with no ataxia or dysmetria.  Tremor was absent.  Gait and Station - deferred.   ASSESSMENT/PLAN [name] is a 85 y.o. female with history of mechanical heart valve replacement 50 years ago, on Coumadin, htn, RA and hx of stroke 2012 who presents to the ED with acute onset of confusion, right sided numbness, difficulty ambulating and speech difficulties. CT - small acute left thalamic hemorrhage. - INR 2.5 -> K-Centra and vitamin K.  She did not receive IV t-PA due to [place].  ICH - small left lateral thalamus and posterior limb internal capsule ICH, likely due to HTN in the setting of coumadin use.  Code Stroke CT Head - Acute hemorrhage involving the left lateral thalamus and posterior limb internal capsule.   CT head 09/05/19 - Slight interval increase of left thalamocapsular junction ICH  CTA H&N - Dolichoectasia of the anterior and posterior circulation. Correlate for history of chronic hypertension. No AVM or aneurysm  CT Head - 09/12/2019 - Unchanged size of intraparenchymal hematoma of the dorsolateral left thalamus. No new site of hemorrhage. Chronic microvascular ischemia and generalized atrophy.  2D Echo - EF 60-65%. Normal RV  size and systolic function. St Jude mechanical aortic valve was functioning normally.  [employer] Virus 2  - negative  LDL - not ordered  HgbA1c - 5.5  VTE prophylaxis - SCDs  warfarin daily prior to admission, now on ASA 325mg . Resume coumadin today with ASA bridge. Recommend INR goal 2.5-3.0. once INR at goal, ASA can be discontinued.   Ongoing aggressive stroke risk factor management  Therapy recommendations:  CIR recommended, but declined by insurance. Daughter may take pt home instead of SNF.  Disposition: home pending  Mechanical Aortic valve  St Jude mechanical aortic valve, on coumadin PTA with INR goal 2.5-3.5  coumadin reversed with  Eppie Gibson and VitK on admission with INR 2.5  On ASA 325  Will resume coumadin with ASA bridge. INR goal 2.5-3.0  Hypertension  Home BP meds: Lasix ; Zestril  Current BP meds: Lasix ; Zestril  Stable . SBP goal < 160 mm Hg . Long-term BP goal normotensive  Other Stroke Risk Factors  Advanced age  Former cigarette smoker - quit  ETOH use, advised to drink no more than 1 alcoholic beverage per day.  Hx of stroke 2012  Other [facility] day # 9  Rosalin Hawking, MD PhD Stroke Neurology 09/13/2019 4:27 PM    To contact Stroke Continuity provider, please refer to [email or web address] After hours, contact General Neurology

## 2019-09-13 NOTE — TOC Initial Note (Signed)
Transition of Care [facility]) - Initial/Assessment Note    Patient Details  Name: Virginia Franklin MRN: [id number] Date of Birth: 03-02-1929  [facility]) CM/SW Contact:    Gelene Mink, [place] Phone Number: 09/13/2019, 2:02 PM  Clinical Narrative:                  CSW met with the patient at bedside. CSW introduced herself and explained her role. CSW shared therapy recommendation. Patient wants to return back to her assisted living facility and complete rehab. CSW stated she would have to look into that to see if it could be a possibility. Patient was adamant about her ALF having SNF. CSW stated she would follow up with them on Monday.   CSW has faxed the patient out to [facility] in [name]. CSW will continue to follow and assist with disposition planning.    Expected Discharge Plan: Skilled Nursing Facility Barriers to Discharge: Insurance Authorization, Continued Medical Work up   Patient Goals and CMS Choice Patient states their goals for this hospitalization and ongoing recovery are:: Pt wants to feel better CMS Medicare.gov Compare Post Acute Care list provided to:: Patient Choice offered to / list presented to : Patient  Expected Discharge Plan and Services Expected Discharge Plan: [place] In-house Referral: Clinical Social Work Discharge Planning Services: NA Post Acute Care Choice: [place] Living arrangements for the past 2 months: [place]                 DME Arranged: N/A DME Agency: NA       HH Arranged: NA [place] Agency: NA        Prior Living Arrangements/Services Living arrangements for the past 2 months: [place] Lives with:: Facility Resident Patient language and need for interpreter reviewed:: No Do you feel safe going back to the place where you live?: Yes      Need for Family Participation in Patient Care: No (Comment) Care giver support system in place?: Yes (comment)    Criminal Activity/Legal Involvement Pertinent to Current Situation/Hospitalization: No - Comment as needed  Activities of Daily Living      Permission Sought/Granted Permission sought to share information with : Case Manager Permission granted to share information with : Yes, Verbal Permission Granted     Permission granted to share info w AGENCY: All SNF        Emotional Assessment Appearance:: Appears stated age Attitude/Demeanor/Rapport: Engaged Affect (typically observed): Calm Orientation: : Oriented to Self, Oriented to Place, Oriented to  Time, Oriented to Situation Alcohol / Substance Use: Not Applicable Psych Involvement: No (comment)  Admission diagnosis:  Hemorrhagic stroke ([place]) [I61.9] ICH (intracerebral hemorrhage) ([place]) [I61.9] Patient Active Problem List   Diagnosis Date Noted  . ICH (intracerebral hemorrhage) ([place]) 09/04/2019  . Diverticulitis 03/29/2019  . Abdominal pain 03/29/2019  . Supratherapeutic INR 03/29/2019  . Kidney mass 03/29/2019  . Hyponatremia 03/29/2019  . Nausea 03/29/2019  . Constipation 03/29/2019  . Alzheimer's dementia ([place]) 01/05/2019  . Depression with anxiety 07/25/2018  . Altered mental status 12/16/2017  . Spells of speech arrest   . HTN (hypertension) 10/15/2017  . Dyslipidemia 10/15/2017  . Hx of completed stroke 10/15/2017  . Osteoarthritis 10/15/2017  . Encounter for therapeutic drug monitoring 09/13/2017  . Chronic anticoagulation 09/05/2017  . Aortic valve replaced 09/05/2017   PCP:  [name], MD Pharmacy:   [id number] [place]  [address] [place] [address] Phone: [phone] Fax: [phone]  [facility] [place], [place] [address] [place] [address] Phone: [phone] Fax: [phone]     Social Determinants of Health (SDOH) Interventions    Readmission Risk Interventions No flowsheet  data found.

## 2019-09-14 DIAGNOSIS — E785 Hyperlipidemia, unspecified: Secondary | ICD-10-CM

## 2019-09-14 DIAGNOSIS — D689 Coagulation defect, unspecified: Secondary | ICD-10-CM

## 2019-09-14 LAB — PROTIME-INR
INR: 1 (ref 0.8–1.2)
Prothrombin Time: 13.3 seconds (ref 11.4–15.2)

## 2019-09-14 LAB — BASIC METABOLIC PANEL
Anion gap: 9 (ref 5–15)
BUN: 16 mg/dL (ref 8–23)
CO2: 24 mmol/L (ref 22–32)
Calcium: 8.9 mg/dL (ref 8.9–10.3)
Chloride: 105 mmol/L (ref 98–111)
Creatinine, Ser: 0.77 mg/dL (ref 0.44–1.00)
GFR calc Af Amer: >60 mL/min (ref >60)
GFR calc non Af Amer: >60 mL/min (ref >60)
Glucose, Bld: 89 mg/dL (ref 70–99)
Potassium: 3.7 mmol/L (ref 3.5–5.1)
Sodium: 138 mmol/L (ref 135–145)

## 2019-09-14 LAB — CBC
HCT: 36.6 % (ref 36.0–46.0)
Hemoglobin: 12.1 g/dL (ref 12.0–15.0)
MCH: 30 pg (ref 26.0–34.0)
MCHC: 33.1 g/dL (ref 30.0–36.0)
MCV: 90.8 fL (ref 80.0–100.0)
Platelets: 227 10*3/uL (ref 150–400)
RBC: 4.03 MIL/uL (ref 3.87–5.11)
RDW: 12.6 % (ref 11.5–15.5)
WBC: 3.9 10*3/uL — ABNORMAL LOW (ref 4.0–10.5)
nRBC: 0 % (ref 0.0–0.2)

## 2019-09-14 MED ORDER — ASPIRIN 325 MG PO TBEC: 325.0000 mg | DELAYED_RELEASE_TABLET | Freq: Every day | ORAL | 0 refills | Status: DC

## 2019-09-14 MED ORDER — LISINOPRIL 20 MG PO TABS: 20.0000 mg | ORAL_TABLET | Freq: Every day | ORAL | 2 refills | Status: DC

## 2019-09-14 MED ORDER — QUETIAPINE FUMARATE 25 MG PO TABS: 12.5000 mg | ORAL_TABLET | Freq: Every day | ORAL | 0 refills | Status: DC

## 2019-09-14 MED ORDER — WARFARIN SODIUM 7.5 MG PO TABS: 3.7500 mg | ORAL_TABLET | ORAL | Status: DC

## 2019-09-14 NOTE — Progress Notes (Signed)
Occupational Therapy Treatment Patient Details Name: Virginia Franklin MRN: [id number] DOB: 05-26-1929 Today's Date: 09/14/2019    History of present illness Virginia Franklin is an 85 y.o. female independent living resident with a history of mechanical heart valve replacement 50 years ago, on Coumadin, who presents to the ED with acute onset of confusion, right sided numbness and difficulty ambulating. Imaging revealed small acute left thalamic hemorrhage. Other PMH including rhuematoid arthritis, osteoarthritis, osteoporosis, cervical fx, cervical cancer, and HTN.    OT comments  Pt making progress towards OT goals. Pt continues to present with decreased awareness, safety, balance, and RUE strength and coordination. Pt performed LB bathing and grooming tasks with supervision-min guard A for safety and balance. Update dc recommendations to home with HHOT to address deficits. Will continue to follow acutely as admitted.   Follow Up Recommendations  Home health OT;Supervision/Assistance - 24 hour    Equipment Recommendations  3 in 1 bedside commode;Wheelchair (measurements OT)    Recommendations for Other Services PT consult    Precautions / Restrictions Precautions Precautions: Fall Restrictions Weight Bearing Restrictions: No       Mobility Bed Mobility               General bed mobility comments: pt sitting in chair at sink upon arrival  Transfers Overall transfer level: Needs assistance Equipment used: Rolling walker (2 wheeled) Transfers: Sit to/from Stand Sit to Stand: Min guard         General transfer comment: required increased time and min guard A for safety and balance    Balance Overall balance assessment: Needs assistance Sitting-balance support: Feet supported Sitting balance-Leahy Scale: Good   Postural control: Right lateral lean Standing balance support: Bilateral upper extremity supported;No upper extremity supported;During functional  activity Standing balance-Leahy Scale: Poor Standing balance comment: Demonstrated R lateral lean while standing during grooming tasks at sink. Required external support for balance                           ADL either performed or assessed with clinical judgement   ADL Overall ADL's : Needs assistance/impaired     Grooming: Wash/dry face;Oral care;Brushing hair;Supervision/safety;Sitting;Standing;Min guard Grooming Details (indicate cue type and reason): required supervision while sitting, min guard A for standing grooming tasks.      Lower Body Bathing: Supervison/ safety;Sit to/from stand Lower Body Bathing Details (indicate cue type and reason): Pt washed feet with supervision while seated Upper Body Dressing : Min guard;Standing Upper Body Dressing Details (indicate cue type and reason): Pt donned gown over the back and tied knot with min guard A for balance and increased time and effort Lower Body Dressing: Min guard;Sit to/from stand Lower Body Dressing Details (indicate cue type and reason): Pt required multiple attempts and encouragement to don socks, requiried increased time and supervision             Functional mobility during ADLs: Minimal assistance;Rolling walker General ADL Comments: Continues to require supervision- min guard A for safety, benefits from encouragement     Vision   Vision Assessment?: No apparent visual deficits   Perception     Praxis      Cognition Arousal/Alertness: Awake/alert Behavior During Therapy: WFL for tasks assessed/performed Overall Cognitive Status: Impaired/Different from baseline Area of Impairment: Attention;Memory;Safety/judgement;Awareness;Problem solving                   Current Attention Level: Sustained Memory: Decreased short-term memory Following Commands:  Follows one step commands with increased time;Follows one step commands consistently Safety/Judgement: Decreased awareness of  safety Awareness: Emergent Problem Solving: Requires verbal cues;Slow processing General Comments: Pt presented with decreased awareness of safety. Presents with slow processing, and requiring increased time to follow one step commands.         Exercises     Shoulder Instructions       General Comments Pt will be going home with daughter today    Pertinent Vitals/ Pain       Pain Assessment: No/denies pain Pain Intervention(s): Monitored during session  Home Living                                          Prior Functioning/Environment              Frequency  Min 2X/week        Progress Toward Goals  OT Goals(current goals can now be found in the care plan section)  Progress towards OT goals: Progressing toward goals  Acute Rehab OT Goals Patient Stated Goal: get back to normal OT Goal Formulation: With patient Time For Goal Achievement: 09/26/19 Potential to Achieve Goals: Good ADL Goals Pt Will Perform Grooming: with supervision;standing Pt Will Perform Upper Body Dressing: with supervision;sitting Pt Will Perform Lower Body Dressing: sit to/from stand;with min guard assist Pt Will Transfer to Toilet: with min guard assist;ambulating;bedside commode Pt Will Perform Toileting - Clothing Manipulation and hygiene: with min guard assist;sit to/from stand Pt Will Perform Tub/Shower Transfer: Shower transfer;with min guard assist;ambulating;shower seat;rolling walker  Plan Discharge plan needs to be updated;Frequency needs to be updated;Equipment recommendations need to be updated    Co-evaluation                 AM-PAC OT "6 Clicks" Daily Activity     Outcome Measure   Help from another person eating meals?: None Help from another person taking care of personal grooming?: A Little Help from another person toileting, which includes using toliet, bedpan, or urinal?: A Little Help from another person bathing (including washing, rinsing,  drying)?: A Little Help from another person to put on and taking off regular upper body clothing?: None Help from another person to put on and taking off regular lower body clothing?: A Little 6 Click Score: 20    End of Session Equipment Utilized During Treatment: Gait belt;Rolling walker  OT Visit Diagnosis: Unsteadiness on feet (R26.81);Other abnormalities of gait and mobility (R26.89);Muscle weakness (generalized) (M62.81);Other symptoms and signs involving cognitive function;Cognitive communication deficit (R41.841) Symptoms and signs involving cognitive functions: Nontraumatic intracerebral hemorrhage   Activity Tolerance Patient tolerated treatment well   Patient Left Other (comment)(walking with PT)   Nurse Communication Mobility status        Time: [id number] OT Time Calculation (min): 19 min  Charges: OT General Charges $OT Visit: 1 Visit OT Treatments $Self Care/Home Management : 8-22 mins  Virginia Franklin, OT Student  Virginia Franklin 09/14/2019, 11:52 AM

## 2019-09-14 NOTE — Progress Notes (Signed)
Physical Therapy Treatment Patient Details Name: Virginia Franklin MRN: [id number] DOB: Mar 13, 1929 Today's Date: 09/14/2019    History of Present Illness Virginia Franklin is an 85 y.o. female independent living resident with a history of mechanical heart valve replacement 50 years ago, on Coumadin, who presents to the ED with acute onset of confusion, right sided numbness and difficulty ambulating. Imaging revealed small acute left thalamic hemorrhage. Other PMH including rhuematoid arthritis, osteoarthritis, osteoporosis, cervical fx, cervical cancer, and HTN.     PT Comments    Pt has made good progress since last visit. Continues to demo STM  And safety awareness deficits but able to ambulate safely with less assistance, min-guard in straight controlled environment, min A with obstacles or when turning. LE ataxia much improved, occasional R lean within RW persists. Changing recommendation to home with family and HHPT.  Pt very much in agreement with this plan.    Follow Up Recommendations  Supervision/Assistance - 24 hour;Home health PT     Equipment Recommendations  None recommended by PT(pt has RW at home)    Recommendations for Other Services       Precautions / Restrictions Precautions Precautions: Fall Restrictions Weight Bearing Restrictions: No    Mobility  Bed Mobility               General bed mobility comments: pt received in chair with OT  Transfers Overall transfer level: Needs assistance Equipment used: Rolling walker (2 wheeled) Transfers: Sit to/from Stand Sit to Stand: Min guard         General transfer comment: min-guard A for safety and vc's for safe hand placement  Ambulation/Gait Ambulation/Gait assistance: Min assist;Min guard Gait Distance (Feet): 125 Feet Assistive device: Rolling walker (2 wheeled) Gait Pattern/deviations: Decreased stride length;Decreased step length - right;Trunk flexed;Wide base of support;Step-through  pattern Gait velocity: decreased Gait velocity interpretation: <1.8 ft/sec, indicate of risk for recurrent falls General Gait Details: decreased attention to R side obstacles, vc's to attend. Min guard with RW on straight path but min A needed for obstacle navigation and turning, min instructional cues for safety   Stairs             Wheelchair Mobility    Modified Rankin (Stroke Patients Only) Modified Rankin (Stroke Patients Only) Pre-Morbid Rankin Score: Moderate disability Modified Rankin: Moderately severe disability     Balance Overall balance assessment: Needs assistance Sitting-balance support: Feet supported Sitting balance-Leahy Scale: Good   Postural control: Right lateral lean Standing balance support: Bilateral upper extremity supported;No upper extremity supported;During functional activity Standing balance-Leahy Scale: Poor Standing balance comment: reliant on RW for UE support, R lean noted within RW                            Cognition Arousal/Alertness: Awake/alert Behavior During Therapy: WFL for tasks assessed/performed Overall Cognitive Status: Impaired/Different from baseline Area of Impairment: Attention;Memory;Safety/judgement;Awareness;Problem solving                   Current Attention Level: Sustained Memory: Decreased short-term memory Following Commands: Follows one step commands with increased time;Follows one step commands consistently Safety/Judgement: Decreased awareness of safety Awareness: Emergent Problem Solving: Requires verbal cues;Slow processing General Comments: pt having word finding difficulties but aware and able to restate what she's trying to say in a way to get her point across with things she can remember. STM deficits persist      Exercises  General Comments General comments (skin integrity, edema, etc.): plan to d/c home with daughter today and f/u with HHPT.       Pertinent Vitals/Pain  Pain Assessment: Faces Faces Pain Scale: Hurts even more Pain Location: knees Pain Descriptors / Indicators: Grimacing;Aching Pain Intervention(s): Monitored during session    Home Living                      Prior Function            PT Goals (current goals can now be found in the care plan section) Acute Rehab PT Goals Patient Stated Goal: get back to normal PT Goal Formulation: With patient Time For Goal Achievement: 09/19/19 Potential to Achieve Goals: Good Progress towards PT goals: Progressing toward goals    Frequency    Min 4X/week      PT Plan Discharge plan needs to be updated    Co-evaluation              AM-PAC PT "6 Clicks" Mobility   Outcome Measure  Help needed turning from your back to your side while in a flat bed without using bedrails?: A Little Help needed moving from lying on your back to sitting on the side of a flat bed without using bedrails?: A Little Help needed moving to and from a bed to a chair (including a wheelchair)?: A Little Help needed standing up from a chair using your arms (e.g., wheelchair or bedside chair)?: A Little Help needed to walk in hospital room?: A Little Help needed climbing 3-5 steps with a railing? : A Lot 6 Click Score: 17    End of Session Equipment Utilized During Treatment: Gait belt Activity Tolerance: Patient tolerated treatment well Patient left: in chair;with call bell/phone within reach;with chair alarm set Nurse Communication: Mobility status PT Visit Diagnosis: Other abnormalities of gait and mobility (R26.89);Difficulty in walking, not elsewhere classified (R26.2)     Time: [id number] PT Time Calculation (min) (ACUTE ONLY): 11 min  Charges:  $Gait Training: 8-22 mins                     Leighton Roach, [place]  Pager [phone] Office [place] 09/14/2019, 12:53 PM

## 2019-09-14 NOTE — TOC Progression Note (Signed)
Transition of Care [facility]) - Progression Note    Patient Details  Name: Virginia Franklin MRN: [id number] Date of Birth: 1929-01-02  [facility] CM/SW Contact  Pollie Friar, RN Phone Number: 09/14/2019, 8:23 AM  Clinical Narrative:    CIR has been denied per patients insurance. CM spoke with patients daughter and she is deciding of having patient d/c to SNF rehab vs home with [facility] at the daughters home. Daughter agreeable to having patient faxed out in the surrounding areas.  TOC following for d/c needs/ disposition.   Expected Discharge Plan: Skilled Nursing Facility Barriers to Discharge: Ship broker, Continued Medical Work up  Expected Discharge Plan and Services Expected Discharge Plan: [place] In-house Referral: Clinical Social Work Discharge Planning Services: NA Post Acute Care Choice: [place] Living arrangements for the past 2 months: [place]                 DME Arranged: N/A DME Agency: NA       HH Arranged: NA [place] Agency: NA         Social Determinants of Health (SDOH) Interventions    Readmission Risk Interventions No flowsheet data found.

## 2019-09-14 NOTE — Progress Notes (Signed)
Patient suffers from [place] which impairs their ability to perform daily activities like bathing, walking in the home. A walker will not resolve  issue with performing activities of daily living. A wheelchair will allow patient to safely perform daily activities. Patient is not able to propel themselves in the home using a standard weight wheelchair due to weakness. Patient can self propel in the lightweight wheelchair. Length of need lifetime.  Accessories: elevating leg rests (ELRs), wheel locks, extensions and anti-tippers.

## 2019-09-14 NOTE — TOC Transition Note (Signed)
Transition of Care [facility]) - CM/SW Discharge Note   Patient Details  Name: Virginia Franklin MRN: [id number] Date of Birth: [birth year]  Transition [facility] Care [facility]) CM/SW Contact:  Pollie Friar, RN Phone Number: 09/14/2019, 11:32 AM   Clinical Narrative:    Pt is discharging home with her daughter to: [facility] in [place].  [facility] selected for [facility] and Cheryl with [facility] accepted the referral. DME to be delivered to the room per [employer].  Daughter to provide transport home once DME arrives.    Final next level of care: [place] Barriers to Discharge: No Barriers Identified   Patient Goals and CMS Choice Patient states their goals for this hospitalization and ongoing recovery are:: Pt wants to feel better CMS Medicare.gov Compare Post Acute Care list provided to:: Patient Represenative (must comment) Choice offered to / list presented to : Adult Children  Discharge Placement                       Discharge Plan and Services In-house Referral: Clinical Social Work Discharge Planning Services: NA Post Acute Care Choice: [place]          DME Arranged: 3-N-1, Wheelchair manual DME Agency: [facility]) Date DME Agency Contacted: 09/14/19   Representative spoke with at DME Agency: Suanne Marker HH Arranged: RN, PT, OT, Nurse's Aide, Social Work [employer] Agency: [place] Date [place]: 09/14/19   Representative spoke with at [place]: [place] Interventions     Readmission Risk Interventions No flowsheet data found.

## 2019-09-16 ENCOUNTER — Other Ambulatory Visit: Payer: Self-pay

## 2019-09-16 ENCOUNTER — Ambulatory Visit (INDEPENDENT_AMBULATORY_CARE_PROVIDER_SITE_OTHER): Payer: Medicare HMO | Admitting: *Deleted

## 2019-09-16 ENCOUNTER — Telehealth: Payer: Self-pay | Admitting: *Deleted

## 2019-09-16 DIAGNOSIS — Z952 Presence of prosthetic heart valve: Secondary | ICD-10-CM

## 2019-09-16 DIAGNOSIS — Z7901 Long term (current) use of anticoagulants: Secondary | ICD-10-CM

## 2019-09-16 LAB — POCT INR: INR: 1.1 — AB (ref 2.0–3.0)

## 2019-09-16 NOTE — Patient Instructions (Addendum)
Description   Take 1.5 tablet today, then continue on same dosage 1 tablet daily except for 1/2 a tablet on Tuesdays and Fridays. Call Coumadin clinic with questions [phone].  Recheck INR on 11/9. Pt is on [place] bridge, read d/c summary from 11/2, [place] should be d/c when her INR is therapeutic. Pt's daughter at bedside. Pt is staying with daughter right now.

## 2019-09-16 NOTE — Progress Notes (Signed)
Discussed pt's INR and dosing with Pharm D. Will instruct for pt to take an extra 1/2 tablet today.

## 2019-09-16 NOTE — Telephone Encounter (Signed)
Transition Care Management Follow-up Telephone Call   Date discharged? 11.02.2020   How have you been since you were released from the hospital? Per Daughter " she is doing good"    Do you understand why you were in the hospital? yes   Do you understand the discharge instructions? yes   Where were you discharged to?  Home    Items Reviewed:  Medications reviewed: yes  Allergies reviewed: yes  Dietary changes reviewed: N/A   Referrals reviewed: yes   Functional Questionnaire:   Activities of Daily Living (ADLs):   She states they are independent in the following: feeding, continence and toileting States they require assistance with the following: ambulation, bathing and hygiene and dressing   Any transportation issues/concerns?: no   Any patient concerns? no   Confirmed importance and date/time of follow-up visits scheduled yes  Provider Appointment booked with Dr. Sarajane Jews 10/02/2019  Confirmed with patient if condition begins to worsen call PCP or go to the ER.  Patient was given the office number and encouraged to call back with question or concerns.  : yes

## 2019-09-18 DIAGNOSIS — Z7901 Long term (current) use of anticoagulants: Secondary | ICD-10-CM | POA: Diagnosis not present

## 2019-09-18 DIAGNOSIS — Z954 Presence of other heart-valve replacement: Secondary | ICD-10-CM | POA: Diagnosis not present

## 2019-09-18 DIAGNOSIS — M069 Rheumatoid arthritis, unspecified: Secondary | ICD-10-CM | POA: Diagnosis not present

## 2019-09-18 DIAGNOSIS — M810 Age-related osteoporosis without current pathological fracture: Secondary | ICD-10-CM | POA: Diagnosis not present

## 2019-09-18 DIAGNOSIS — I10 Essential (primary) hypertension: Secondary | ICD-10-CM | POA: Diagnosis not present

## 2019-09-18 DIAGNOSIS — G309 Alzheimer's disease, unspecified: Secondary | ICD-10-CM | POA: Diagnosis not present

## 2019-09-18 DIAGNOSIS — R69 Illness, unspecified: Secondary | ICD-10-CM | POA: Diagnosis not present

## 2019-09-18 DIAGNOSIS — I69351 Hemiplegia and hemiparesis following cerebral infarction affecting right dominant side: Secondary | ICD-10-CM | POA: Diagnosis not present

## 2019-09-21 ENCOUNTER — Ambulatory Visit (INDEPENDENT_AMBULATORY_CARE_PROVIDER_SITE_OTHER): Payer: Medicare HMO

## 2019-09-21 ENCOUNTER — Other Ambulatory Visit: Payer: Self-pay

## 2019-09-21 ENCOUNTER — Encounter: Payer: Self-pay | Admitting: Family Medicine

## 2019-09-21 DIAGNOSIS — Z952 Presence of prosthetic heart valve: Secondary | ICD-10-CM

## 2019-09-21 DIAGNOSIS — Z5181 Encounter for therapeutic drug level monitoring: Secondary | ICD-10-CM | POA: Diagnosis not present

## 2019-09-21 DIAGNOSIS — Z7901 Long term (current) use of anticoagulants: Secondary | ICD-10-CM | POA: Diagnosis not present

## 2019-09-21 LAB — POCT INR: INR: 2.5 (ref 2.0–3.0)

## 2019-09-21 NOTE — Patient Instructions (Signed)
Description   Stop Aspirin. Continue on same dosage 1 tablet daily except for 1/2 a tablet on Tuesdays and Fridays. Call Coumadin clinic with questions [phone].  Recheck INR in 2 weeks. Pt is staying with daughter right now.

## 2019-09-22 ENCOUNTER — Telehealth: Payer: Self-pay | Admitting: *Deleted

## 2019-09-22 ENCOUNTER — Telehealth: Payer: Self-pay | Admitting: Interventional Cardiology

## 2019-09-22 ENCOUNTER — Telehealth: Payer: Self-pay

## 2019-09-22 DIAGNOSIS — Z7901 Long term (current) use of anticoagulants: Secondary | ICD-10-CM | POA: Diagnosis not present

## 2019-09-22 DIAGNOSIS — G309 Alzheimer's disease, unspecified: Secondary | ICD-10-CM | POA: Diagnosis not present

## 2019-09-22 DIAGNOSIS — I69351 Hemiplegia and hemiparesis following cerebral infarction affecting right dominant side: Secondary | ICD-10-CM | POA: Diagnosis not present

## 2019-09-22 DIAGNOSIS — M810 Age-related osteoporosis without current pathological fracture: Secondary | ICD-10-CM | POA: Diagnosis not present

## 2019-09-22 DIAGNOSIS — M069 Rheumatoid arthritis, unspecified: Secondary | ICD-10-CM | POA: Diagnosis not present

## 2019-09-22 DIAGNOSIS — I10 Essential (primary) hypertension: Secondary | ICD-10-CM | POA: Diagnosis not present

## 2019-09-22 DIAGNOSIS — R69 Illness, unspecified: Secondary | ICD-10-CM | POA: Diagnosis not present

## 2019-09-22 DIAGNOSIS — Z954 Presence of other heart-valve replacement: Secondary | ICD-10-CM | POA: Diagnosis not present

## 2019-09-22 NOTE — Telephone Encounter (Signed)
Copied from [place] [id number]. Topic: General - Other >> Sep 22, 2019  9:00 AM Carolyn Stare wrote: Pt daughter following up on a req for stool softner  [facility]

## 2019-09-22 NOTE — Telephone Encounter (Signed)
fyi

## 2019-09-22 NOTE — Telephone Encounter (Signed)
Pt's daughter called to follow up on request. She would like to hear something ASAP. Please advise.

## 2019-09-22 NOTE — Telephone Encounter (Signed)
New Message    Virginia Franklin is calling and says  Home care has recommended that she sees Dr Tamala Julian right away, not a PA  They said she had 2 TIA's in the last week    Please call

## 2019-09-22 NOTE — Telephone Encounter (Signed)
Copied from [place] [id number]. Topic: Quick Communication - Home Health Verbal Orders >> Sep 22, 2019  2:50 PM Carolyn Stare wrote: Caller/Agency Mickel Baas with [facility]     also pt has not had a bowel movement in a week   Callback Number [phone]  can leave a message   Requesting PT   Frequency 2 X 4  and  1 X 2

## 2019-09-22 NOTE — Telephone Encounter (Addendum)
[name] (daughter) best contact # [phone]

## 2019-09-22 NOTE — Telephone Encounter (Signed)
I spoke with pt's daughter. Pt was recently hospitalized with stroke.Last night pt had episode where she had slurred speech.  Two days prior to this she didn't feel well. After both events pt went to bed and quickly fell asleep.   Pt is being seen by home health nurse, PT and OT.  Daughter reports she discussed recent events with nurse who said pt may be having TIA's and recommended she call our office for a sooner appointment.  I advised daughter to contact neurology and make them aware of recent events.  Daughter reports she has already done this.   I told daughter I would send message to Dr Tamala Julian and we would call her back once he reviews.

## 2019-09-22 NOTE — Telephone Encounter (Signed)
Pt daughter advised to used OTC stool softer.

## 2019-09-23 DIAGNOSIS — Z954 Presence of other heart-valve replacement: Secondary | ICD-10-CM | POA: Diagnosis not present

## 2019-09-23 DIAGNOSIS — M810 Age-related osteoporosis without current pathological fracture: Secondary | ICD-10-CM | POA: Diagnosis not present

## 2019-09-23 DIAGNOSIS — M069 Rheumatoid arthritis, unspecified: Secondary | ICD-10-CM | POA: Diagnosis not present

## 2019-09-23 DIAGNOSIS — Z7901 Long term (current) use of anticoagulants: Secondary | ICD-10-CM | POA: Diagnosis not present

## 2019-09-23 DIAGNOSIS — I69351 Hemiplegia and hemiparesis following cerebral infarction affecting right dominant side: Secondary | ICD-10-CM | POA: Diagnosis not present

## 2019-09-23 DIAGNOSIS — R55 Syncope and collapse: Secondary | ICD-10-CM | POA: Diagnosis not present

## 2019-09-23 DIAGNOSIS — I10 Essential (primary) hypertension: Secondary | ICD-10-CM | POA: Diagnosis not present

## 2019-09-23 DIAGNOSIS — R69 Illness, unspecified: Secondary | ICD-10-CM | POA: Diagnosis not present

## 2019-09-23 DIAGNOSIS — G309 Alzheimer's disease, unspecified: Secondary | ICD-10-CM | POA: Diagnosis not present

## 2019-09-23 DIAGNOSIS — K5900 Constipation, unspecified: Secondary | ICD-10-CM | POA: Diagnosis not present

## 2019-09-23 DIAGNOSIS — I959 Hypotension, unspecified: Secondary | ICD-10-CM | POA: Diagnosis not present

## 2019-09-23 NOTE — Telephone Encounter (Signed)
[place] for PT. Bowel issue was taking care of. Pt daughter was told to purchase Senokot OTC.

## 2019-09-23 NOTE — Telephone Encounter (Signed)
Please order the PT

## 2019-09-24 NOTE — Telephone Encounter (Signed)
Verbal orders given to  Mickel Baas. No further action needed.

## 2019-09-24 NOTE — Telephone Encounter (Signed)
We are obliged to follow the Neurology recommendations.

## 2019-09-24 NOTE — Telephone Encounter (Signed)
Pt daughter stated that the pt is having BM now. She stated that they had to call EMS last night bc pt was dizzy while on the toilet. EMS worker advise that when someone is very constipated it can caused dizziness. Pt daughter stated that they ran labs and everything was good. Pt daughter is having concerns about pt T.I.As she stated that pt cannot see the cardiologist for another 2 weeks.

## 2019-09-24 NOTE — Telephone Encounter (Signed)
This has been handled. Will call pt daughter to see if the OTC worked for pt.

## 2019-09-24 NOTE — Telephone Encounter (Signed)
Noted! Pt daughter notified of update. No further action needed.

## 2019-09-24 NOTE — Telephone Encounter (Signed)
If these are really TIA's there is not much we can do. She is already on a blood thinner (Coumadin). If her BP is controlled, the only other thing is for her to drink plenty of fluids and avoid getting dehydrated

## 2019-09-25 NOTE — Telephone Encounter (Signed)
Spoke with daughter and made her aware of what Dr. Tamala Julian said.  She states pt's appt with Neuro is for 12/1 right now but they are on a waiting list in case of a cancellation.  She thanked me for the call.

## 2019-09-26 DIAGNOSIS — I69351 Hemiplegia and hemiparesis following cerebral infarction affecting right dominant side: Secondary | ICD-10-CM | POA: Diagnosis not present

## 2019-09-26 DIAGNOSIS — Z954 Presence of other heart-valve replacement: Secondary | ICD-10-CM | POA: Diagnosis not present

## 2019-09-26 DIAGNOSIS — Z7901 Long term (current) use of anticoagulants: Secondary | ICD-10-CM | POA: Diagnosis not present

## 2019-09-26 DIAGNOSIS — I10 Essential (primary) hypertension: Secondary | ICD-10-CM | POA: Diagnosis not present

## 2019-09-26 DIAGNOSIS — M810 Age-related osteoporosis without current pathological fracture: Secondary | ICD-10-CM | POA: Diagnosis not present

## 2019-09-26 DIAGNOSIS — R69 Illness, unspecified: Secondary | ICD-10-CM | POA: Diagnosis not present

## 2019-09-26 DIAGNOSIS — G309 Alzheimer's disease, unspecified: Secondary | ICD-10-CM | POA: Diagnosis not present

## 2019-09-26 DIAGNOSIS — M069 Rheumatoid arthritis, unspecified: Secondary | ICD-10-CM | POA: Diagnosis not present

## 2019-09-28 DIAGNOSIS — R69 Illness, unspecified: Secondary | ICD-10-CM | POA: Diagnosis not present

## 2019-09-28 DIAGNOSIS — M069 Rheumatoid arthritis, unspecified: Secondary | ICD-10-CM | POA: Diagnosis not present

## 2019-09-28 DIAGNOSIS — Z954 Presence of other heart-valve replacement: Secondary | ICD-10-CM | POA: Diagnosis not present

## 2019-09-28 DIAGNOSIS — Z7901 Long term (current) use of anticoagulants: Secondary | ICD-10-CM | POA: Diagnosis not present

## 2019-09-28 DIAGNOSIS — I10 Essential (primary) hypertension: Secondary | ICD-10-CM | POA: Diagnosis not present

## 2019-09-28 DIAGNOSIS — M810 Age-related osteoporosis without current pathological fracture: Secondary | ICD-10-CM | POA: Diagnosis not present

## 2019-09-28 DIAGNOSIS — G309 Alzheimer's disease, unspecified: Secondary | ICD-10-CM | POA: Diagnosis not present

## 2019-09-28 DIAGNOSIS — I69351 Hemiplegia and hemiparesis following cerebral infarction affecting right dominant side: Secondary | ICD-10-CM | POA: Diagnosis not present

## 2019-09-30 DIAGNOSIS — I10 Essential (primary) hypertension: Secondary | ICD-10-CM | POA: Diagnosis not present

## 2019-09-30 DIAGNOSIS — G309 Alzheimer's disease, unspecified: Secondary | ICD-10-CM | POA: Diagnosis not present

## 2019-09-30 DIAGNOSIS — Z7901 Long term (current) use of anticoagulants: Secondary | ICD-10-CM | POA: Diagnosis not present

## 2019-09-30 DIAGNOSIS — I69351 Hemiplegia and hemiparesis following cerebral infarction affecting right dominant side: Secondary | ICD-10-CM | POA: Diagnosis not present

## 2019-09-30 DIAGNOSIS — M810 Age-related osteoporosis without current pathological fracture: Secondary | ICD-10-CM | POA: Diagnosis not present

## 2019-09-30 DIAGNOSIS — Z954 Presence of other heart-valve replacement: Secondary | ICD-10-CM | POA: Diagnosis not present

## 2019-09-30 DIAGNOSIS — M069 Rheumatoid arthritis, unspecified: Secondary | ICD-10-CM | POA: Diagnosis not present

## 2019-09-30 DIAGNOSIS — R69 Illness, unspecified: Secondary | ICD-10-CM | POA: Diagnosis not present

## 2019-10-01 ENCOUNTER — Other Ambulatory Visit: Payer: Self-pay

## 2019-10-02 ENCOUNTER — Ambulatory Visit (INDEPENDENT_AMBULATORY_CARE_PROVIDER_SITE_OTHER): Payer: Medicare HMO | Admitting: Family Medicine

## 2019-10-02 ENCOUNTER — Encounter: Payer: Self-pay | Admitting: Family Medicine

## 2019-10-02 ENCOUNTER — Other Ambulatory Visit: Payer: Self-pay

## 2019-10-02 VITALS — BP 120/56 | HR 71 | Temp 97.3°F | Ht 65.0 in | Wt 133.6 lb

## 2019-10-02 DIAGNOSIS — F418 Other specified anxiety disorders: Secondary | ICD-10-CM

## 2019-10-02 DIAGNOSIS — D689 Coagulation defect, unspecified: Secondary | ICD-10-CM

## 2019-10-02 DIAGNOSIS — I10 Essential (primary) hypertension: Secondary | ICD-10-CM

## 2019-10-02 DIAGNOSIS — R300 Dysuria: Secondary | ICD-10-CM

## 2019-10-02 DIAGNOSIS — R69 Illness, unspecified: Secondary | ICD-10-CM | POA: Diagnosis not present

## 2019-10-02 DIAGNOSIS — G300 Alzheimer's disease with early onset: Secondary | ICD-10-CM | POA: Diagnosis not present

## 2019-10-02 DIAGNOSIS — I619 Nontraumatic intracerebral hemorrhage, unspecified: Secondary | ICD-10-CM

## 2019-10-02 DIAGNOSIS — N390 Urinary tract infection, site not specified: Secondary | ICD-10-CM | POA: Diagnosis not present

## 2019-10-02 DIAGNOSIS — F0280 Dementia in other diseases classified elsewhere without behavioral disturbance: Secondary | ICD-10-CM

## 2019-10-02 LAB — POCT URINALYSIS DIPSTICK
Bilirubin, UA: NEGATIVE
Blood, UA: POSITIVE
Glucose, UA: NEGATIVE
Ketones, UA: NEGATIVE
Leukocytes, UA: NEGATIVE
Nitrite, UA: NEGATIVE
Protein, UA: NEGATIVE
Spec Grav, UA: 1.015 (ref 1.010–1.025)
Urobilinogen, UA: 0.2 E.U./dL
pH, UA: 6 (ref 5.0–8.0)

## 2019-10-02 MED ORDER — LISINOPRIL 10 MG PO TABS: 10.0000 mg | ORAL_TABLET | Freq: Every day | ORAL | 3 refills | Status: DC

## 2019-10-02 MED ORDER — NITROFURANTOIN MONOHYD MACRO 100 MG PO CAPS: 100.0000 mg | ORAL_CAPSULE | Freq: Two times a day (BID) | ORAL | 0 refills | Status: DC

## 2019-10-02 NOTE — Progress Notes (Signed)
   Subjective:    Patient ID: Virginia Franklin, female    DOB: 06-16-1929, 85 y.o.   MRN: [id number]  HPI Here with her daughter to follow up on a hospital stay from 09-04-19 to 09-14-19 for an acute hemorrhagic stroke to the left lateral thalamus. She presented with confusion, right sided weakness, and slurred speech. This was stabilized and she has done fairly well since then. She was sent home on Coumadin and ASA, but the ASA was stopped within a week. Her BP had been up so her Lisinopril dose was increased. She has bene getting PT and OT, and she is staying with her daughter. Her speech is back to normal but she feels weak all over and sometimes has to lie down because of fatigue. Her BP at home is often low, and her BP today is a bit low. For the past 2 days she has had some urinary burning and low back pain. No fever. Her appetite is good. She drinks plenty of water.    Review of Systems  Constitutional: Positive for fatigue.  Respiratory: Negative.   Cardiovascular: Negative.   Gastrointestinal: Negative.   Genitourinary: Positive for dysuria. Negative for frequency, hematuria and urgency.  Neurological: Positive for weakness. Negative for dizziness, seizures, facial asymmetry, speech difficulty, numbness and headaches.       Objective:   Physical Exam Constitutional:      Comments: Walks with a walker   Cardiovascular:     Rate and Rhythm: Normal rate and regular rhythm.     Pulses: Normal pulses.     Heart sounds: Normal heart sounds.  Pulmonary:     Effort: Pulmonary effort is normal.     Breath sounds: Normal breath sounds.  Abdominal:     General: Abdomen is flat. Bowel sounds are normal. There is no distension.     Palpations: Abdomen is soft. There is no mass.     Tenderness: There is no abdominal tenderness. There is no guarding or rebound.     Hernia: No hernia is present.  Neurological:     General: No focal deficit present.     Mental Status: She is alert and  oriented to person, place, and time.           Assessment & Plan:  She is recovering from a stroke and seems to be progressing steadily. Her HTN is overly controlled so we will decrease the Lisinopril back to 10 mg daily. Treat the UTI with Macrobid, and we will culture the sample. She will see her Cardiologist next week and she will see Jonetta Osgood of [facility] in 2 weeks. She will continue to stay with her daughter for now, and we will see if she could return to her assisted living facility at some point.  Alysia Penna, MD

## 2019-10-03 LAB — URINE CULTURE
MICRO NUMBER:: 1124269
SPECIMEN QUALITY:: ADEQUATE

## 2019-10-04 NOTE — Progress Notes (Signed)
Cardiology Office Note    Date:  10/05/2019   ID:  Dounia Notch [place], DOB Sep 17, 1929, MRN [id number]  PCP:  [name], MD  Cardiologist:  Dr. Tamala Julian  Chief Complaint: Hospital follow up  History of Present Illness:   Virginia Franklin is a 85 y.o. female with a hx of dementia, essential hypertension, embolic CVA,mechanical [employer]. [employer] aortic valve for rheumatic fever 1990, and chronic coumadin therapy seen for follow up.   She was doing relatively well when last seen by Dr. Tamala Julian January 2020.  Patient was recently admitted secondary to acute intracranial hemorrhage in setting of hypertension and Coumadin use.  INR was 2.5.  K-Centra and Vitamin K. She was given echocardiogram showed normal LV function and normal functioning valve.  Her Coumadin was resumed after a few days with aspirin bridge.  Plan to discontinue aspirin 325 mg once INR at goal of 2.5-3.0.  Last INR was 2.5 on 09/21/2019>> stopped ASA.   Here today for follow up. Doing well. Recently started on abx for UTI. Urine culture normal. The patient denies nausea, vomiting, fever, chest pain, palpitations, shortness of breath, orthopnea, PND, dizziness, syncope, cough, congestion, abdominal pain, hematochezia, melena, lower extremity edema.  Past Medical History:  Diagnosis Date  . Body mass index (BMI) of 22.0-22.9 in adult   . Cervical cancer ([place])   . H/O cervical fracture   . H/O osteoporosis   . H/O rheumatoid arthritis   . H/O: CVA (cerebrovascular accident) 2012  . H/O: osteoarthritis   . History of urinary frequency   . Hx of diverticulitis of colon   . Hypertension     Past Surgical History:  Procedure Laterality Date  . ABDOMINAL HYSTERECTOMY    . CARDIAC SURGERY    . CATARACT EXTRACTION    . SMALL INTESTINE SURGERY      Current Medications: Prior to Admission medications   Medication Sig Start Date End Date Taking? Authorizing Provider  escitalopram (LEXAPRO) 5 MG tablet Take 1 tablet (5  mg total) by mouth daily. 01/07/19   [name], MD  ezetimibe-simvastatin (VYTORIN) 10-40 MG tablet Take 1 tablet by mouth daily.    [provider]  furosemide (LASIX) 20 MG tablet TAKE 1 TABLET DAILY Patient taking differently: Take 20 mg by mouth daily.  04/08/19   [name], MD  lisinopril (ZESTRIL) 10 MG tablet Take 1 tablet (10 mg total) by mouth daily. 10/02/19   [name], MD  memantine (NAMENDA) 10 MG tablet Take 10 mg by mouth 2 (two) times daily.    [provider]  nitrofurantoin, macrocrystal-monohydrate, (MACROBID) 100 MG capsule Take 1 capsule (100 mg total) by mouth 2 (two) times daily. 10/02/19   [name], MD  QUEtiapine (SEROQUEL) 25 MG tablet Take 0.5 tablets (12.5 mg total) by mouth at bedtime. 09/14/19   Donzetta Starch, NP  solifenacin (VESICARE) 5 MG tablet Take 1 tablet (5 mg total) by mouth daily. 07/31/19   [name], MD  warfarin (COUMADIN) 7.5 MG tablet Take 0.5-1 tablets (3.75-7.5 mg total) by mouth See admin instructions. At d/c from hospital 11/2 - take 7.5 daily at 6p. Next lab draw 11/4. Adjust dose per coumadin clinic 09/14/19   Donzetta Starch, NP    Allergies:   Penicillins   Social History   Socioeconomic History  . Marital status: Widowed    Spouse name: Not on file  . Number of children: 2  . Years of  education: 14  . Highest education level: Not on file  Occupational History  . Not on file  Social Needs  . Financial resource strain: Not on file  . Food insecurity    Worry: Not on file    Inability: Not on file  . Transportation needs    Medical: Not on file    Non-medical: Not on file  Tobacco Use  . Smoking status: Former Research scientist (life sciences)  . Smokeless tobacco: Never Used  Substance and Sexual Activity  . Alcohol use: Yes    Comment: SOCIALLY  . Drug use: No  . Sexual activity: Not on file    Comment: WIDOW  Lifestyle  . Physical activity    Days per week: Not on file    Minutes per session: Not on file  .  Stress: Not on file  Relationships  . Social Herbalist on phone: Not on file    Gets together: Not on file    Attends religious service: Not on file    Active member of club or organization: Not on file    Attends meetings of clubs or organizations: Not on file    Relationship status: Not on file  Other Topics Concern  . Not on file  Social History Narrative   Lives alone in a retirement home.  Has 2 children.  Retired Haematologist.  Education: Probation officer school.      Family History:  The patient's family history includes Arthritis in her father; Heart Problems in her father; Kidney disease in her mother; Lung disease in her brother.   ROS:   Please see the history of present illness.    ROS All other systems reviewed and are negative.   PHYSICAL EXAM:   VS:  BP 132/68   Pulse 61   Ht 5\' 5"  (1.651 m)   Wt 132 lb 12.8 oz (60.2 kg)   SpO2 96%   BMI 22.10 kg/m    GEN: Well nourished, well developed, in no acute distress  HEENT: normal  Neck: no JVD, carotid bruits, or masses Cardiac: RRR; crisp valve sound, rubs, or gallops,no edema  Respiratory:  clear to auscultation bilaterally, normal work of breathing GI: soft, nontender, nondistended, + BS MS: no deformity or atrophy  Skin: warm and dry, no rash Neuro:  Alert and Oriented x 3, Strength and sensation are intact Psych: euthymic mood, full affect  Wt Readings from Last 3 Encounters:  10/05/19 132 lb 12.8 oz (60.2 kg)  10/02/19 133 lb 9.6 oz (60.6 kg)  09/04/19 132 lb 15 oz (60.3 kg)      Studies/Labs Reviewed:   EKG:  EKG is not ordered today.    Recent Labs: 03/30/2019: Magnesium 1.9 09/04/2019: ALT 20 09/14/2019: BUN 16; Creatinine, Ser 0.77; Hemoglobin 12.1; Platelets 227; Potassium 3.7; Sodium 138   Lipid Panel    Component Value Date/Time   CHOL 149 01/13/2018 1219   TRIG 63.0 01/13/2018 1219   HDL 72.40 01/13/2018 1219   CHOLHDL 2 01/13/2018 1219   VLDL 12.6 01/13/2018 1219   LDLCALC 64  01/13/2018 1219    Additional studies/ records that were reviewed today include:   Echocardiogram: 09/08/2019 1. Left ventricular ejection fraction, by visual estimation, is 60 to 65%. The left ventricle has normal function. Normal left ventricular size. There is mildly increased left ventricular hypertrophy.  2. Abnormal septal motion consistent with post-operative status.  3. Left ventricular diastolic Doppler parameters are consistent with impaired relaxation pattern of LV diastolic  filling.  4. Global right ventricle has normal systolic function.The right ventricular size is normal. No increase in right ventricular wall thickness.  5. Left atrial size was normal.  6. Right atrial size was normal.  7. Mild mitral annular calcification.  8. The mitral valve is normal in structure. No evidence of mitral valve regurgitation.  9. The tricuspid valve is grossly normal. Tricuspid valve regurgitation was not visualized by color flow Doppler. 10. Bileaflet mechanical prosthesis in the aortic valve position. 11. Aortic valve regurgitation is trivial ("physiological" backwash) by color flow Doppler. 12. Aortic prosthesis gradients are unchanged since 2019. 13. The pulmonic valve was not well visualized. Pulmonic valve regurgitation is not visualized by color flow Doppler. 14. TR signal is inadequate for assessing pulmonary artery systolic pressure. 15. The inferior vena cava is normal in size with greater than 50% respiratory variability, suggesting right atrial pressure of 3 mmHg.    ASSESSMENT & PLAN:    1.  Mechanical aortic valve 2.  Recent intracranial hemorrhage with prior history of embolic stroke  Very complicated situation.  Her Coumadin was held during admission for few days secondary to hemorrhage.  He was discharged on Coumadin with aspirin 325 mg bridge with plan to discontinue aspirin after INR at goal of 2.5-3.0.  She does have prior history of embolic stroke.  Echo during  admission showed normal LV function and normal functioning valve. INR now at goal and ASA has been stopped. Doing well. No bleeding issue.    3. HTN - BP stable on lisinopril  Medication Adjustments/Labs and Tests Ordered: Current medicines are reviewed at length with the patient today.  Concerns regarding medicines are outlined above.  Medication changes, Labs and Tests ordered today are listed in the Patient Instructions below. Patient Instructions  Medication Instructions:  Your physician recommends that you continue on your current medications as directed. Please refer to the Current Medication list given to you today.  *If you need a refill on your cardiac medications before your next appointment, please call your pharmacy*  Lab Work: None ordered  If you have labs (blood work) drawn today and your tests are completely normal, you will receive your results only by: Marland Kitchen MyChart Message (if you have MyChart) OR . A paper copy in the mail If you have any lab test that is abnormal or we need to change your treatment, we will call you to review the results.  Testing/Procedures: None ordered  Follow-Up: At [facility], you and your health needs are our priority.  As part of our continuing mission to provide you with exceptional heart care, we have created designated Provider Care Teams.  These Care Teams include your primary Cardiologist (physician) and Advanced Practice Providers (APPs -  Physician Assistants and Nurse Practitioners) who all work together to provide you with the care you need, when you need it.  Your next appointment:   6 month(s)  The format for your next appointment:   In Person  Provider:   You may see Sinclair Grooms, MD or one of the following Advanced Practice Providers on your designated Care Team:    Truitt Merle, NP  Cecilie Kicks, NP  Kathyrn Drown, NP   Other Instructions      Signed, Leanor Kail, [place]  10/05/2019 2:36 PM    [facility] [place], [place],   [address] Phone: [phone]; Fax: [phone]

## 2019-10-05 ENCOUNTER — Ambulatory Visit (INDEPENDENT_AMBULATORY_CARE_PROVIDER_SITE_OTHER): Payer: Medicare HMO | Admitting: Physician Assistant

## 2019-10-05 ENCOUNTER — Ambulatory Visit (INDEPENDENT_AMBULATORY_CARE_PROVIDER_SITE_OTHER): Payer: Medicare HMO | Admitting: Pharmacist

## 2019-10-05 ENCOUNTER — Encounter: Payer: Self-pay | Admitting: Physician Assistant

## 2019-10-05 ENCOUNTER — Other Ambulatory Visit: Payer: Self-pay

## 2019-10-05 VITALS — BP 132/68 | HR 61 | Ht 65.0 in | Wt 132.8 lb

## 2019-10-05 DIAGNOSIS — R69 Illness, unspecified: Secondary | ICD-10-CM | POA: Diagnosis not present

## 2019-10-05 DIAGNOSIS — Z7901 Long term (current) use of anticoagulants: Secondary | ICD-10-CM

## 2019-10-05 DIAGNOSIS — R4182 Altered mental status, unspecified: Secondary | ICD-10-CM | POA: Diagnosis not present

## 2019-10-05 DIAGNOSIS — Z952 Presence of prosthetic heart valve: Secondary | ICD-10-CM | POA: Diagnosis not present

## 2019-10-05 DIAGNOSIS — I10 Essential (primary) hypertension: Secondary | ICD-10-CM

## 2019-10-05 DIAGNOSIS — Z954 Presence of other heart-valve replacement: Secondary | ICD-10-CM | POA: Diagnosis not present

## 2019-10-05 DIAGNOSIS — M069 Rheumatoid arthritis, unspecified: Secondary | ICD-10-CM | POA: Diagnosis not present

## 2019-10-05 DIAGNOSIS — M810 Age-related osteoporosis without current pathological fracture: Secondary | ICD-10-CM | POA: Diagnosis not present

## 2019-10-05 DIAGNOSIS — I69351 Hemiplegia and hemiparesis following cerebral infarction affecting right dominant side: Secondary | ICD-10-CM | POA: Diagnosis not present

## 2019-10-05 DIAGNOSIS — Z5181 Encounter for therapeutic drug level monitoring: Secondary | ICD-10-CM

## 2019-10-05 DIAGNOSIS — G309 Alzheimer's disease, unspecified: Secondary | ICD-10-CM | POA: Diagnosis not present

## 2019-10-05 LAB — POCT INR: INR: 5 — AB (ref 2.0–3.0)

## 2019-10-05 NOTE — Patient Instructions (Signed)
Medication Instructions:  Your physician recommends that you continue on your current medications as directed. Please refer to the Current Medication list given to you today.  *If you need a refill on your cardiac medications before your next appointment, please call your pharmacy*  Lab Work: None ordered  If you have labs (blood work) drawn today and your tests are completely normal, you will receive your results only by: Marland Kitchen MyChart Message (if you have MyChart) OR . A paper copy in the mail If you have any lab test that is abnormal or we need to change your treatment, we will call you to review the results.  Testing/Procedures: None ordered  Follow-Up: At [facility], you and your health needs are our priority.  As part of our continuing mission to provide you with exceptional heart care, we have created designated Provider Care Teams.  These Care Teams include your primary Cardiologist (physician) and Advanced Practice Providers (APPs -  Physician Assistants and Nurse Practitioners) who all work together to provide you with the care you need, when you need it.  Your next appointment:   6 month(s)  The format for your next appointment:   In Person  Provider:   You may see Sinclair Grooms, MD or one of the following Advanced Practice Providers on your designated Care Team:    Truitt Merle, NP  Cecilie Kicks, NP  Kathyrn Drown, NP   Other Instructions

## 2019-10-05 NOTE — Patient Instructions (Signed)
Description   Skip Coumadin today and tomorrow, then continue on same dosage 1 tablet daily except for 1/2 tablet on Tuesdays and Fridays. Call Coumadin clinic with questions [phone].  Recheck INR in 2 weeks. Pt is staying with daughter right now.

## 2019-10-06 ENCOUNTER — Telehealth: Payer: Self-pay

## 2019-10-06 NOTE — Telephone Encounter (Unsigned)
Copied from [place] [phone]. Topic: General - Inquiry >> Oct 06, 2019  2:23 PM Mathis Bud wrote: Reason for CRM: Shea Stakes from [facility] home health called stating patients daughter does not know why she is on medication QUEtiapine (SEROQUEL) 25 MG tablet  Daughter is requesting a call back to get info on patient medication  Call back [phone]

## 2019-10-06 NOTE — Telephone Encounter (Signed)
Copied from [place] [phone]. Topic: General - Inquiry >> Oct 06, 2019  2:23 PM Mathis Bud wrote: Reason for CRM: Shea Stakes from [facility] home health called stating patients daughter does not know why she is on medication QUEtiapine (SEROQUEL) 25 MG tablet  Daughter is requesting a call back to get info on patient medication  Call back [phone]

## 2019-10-06 NOTE — Telephone Encounter (Signed)
This medication was not filled by PCP. Will inform pt daughter.

## 2019-10-07 DIAGNOSIS — Z954 Presence of other heart-valve replacement: Secondary | ICD-10-CM | POA: Diagnosis not present

## 2019-10-07 DIAGNOSIS — M069 Rheumatoid arthritis, unspecified: Secondary | ICD-10-CM | POA: Diagnosis not present

## 2019-10-07 DIAGNOSIS — G309 Alzheimer's disease, unspecified: Secondary | ICD-10-CM | POA: Diagnosis not present

## 2019-10-07 DIAGNOSIS — M810 Age-related osteoporosis without current pathological fracture: Secondary | ICD-10-CM | POA: Diagnosis not present

## 2019-10-07 DIAGNOSIS — I10 Essential (primary) hypertension: Secondary | ICD-10-CM | POA: Diagnosis not present

## 2019-10-07 DIAGNOSIS — Z7901 Long term (current) use of anticoagulants: Secondary | ICD-10-CM | POA: Diagnosis not present

## 2019-10-07 DIAGNOSIS — R69 Illness, unspecified: Secondary | ICD-10-CM | POA: Diagnosis not present

## 2019-10-07 DIAGNOSIS — I69351 Hemiplegia and hemiparesis following cerebral infarction affecting right dominant side: Secondary | ICD-10-CM | POA: Diagnosis not present

## 2019-10-07 NOTE — Telephone Encounter (Signed)
Yes she can stop the medication and see how she does

## 2019-10-07 NOTE — Telephone Encounter (Signed)
This was started while she was in the hospital, normally it is used to help with anxiety or agitation in the evenings

## 2019-10-07 NOTE — Telephone Encounter (Signed)
Spoke with pt's daughter and she reports pt is no longer having any issues at night with anxiety or disorientation like she did in the hospital. The daughter would like to know if pt can discontinue this medication due to the warnings. Please advise.

## 2019-10-07 NOTE — Telephone Encounter (Signed)
Please advise 

## 2019-10-12 NOTE — Telephone Encounter (Signed)
Called provided number 2x no vm.

## 2019-10-13 ENCOUNTER — Encounter

## 2019-10-13 ENCOUNTER — Other Ambulatory Visit: Payer: Self-pay

## 2019-10-13 ENCOUNTER — Ambulatory Visit (INDEPENDENT_AMBULATORY_CARE_PROVIDER_SITE_OTHER): Payer: Medicare HMO | Admitting: Adult Health

## 2019-10-13 ENCOUNTER — Encounter: Payer: Self-pay | Admitting: Adult Health

## 2019-10-13 VITALS — BP 116/69 | HR 78 | Temp 97.1°F | Ht 67.0 in | Wt 133.8 lb

## 2019-10-13 DIAGNOSIS — F0280 Dementia in other diseases classified elsewhere without behavioral disturbance: Secondary | ICD-10-CM

## 2019-10-13 DIAGNOSIS — Z7901 Long term (current) use of anticoagulants: Secondary | ICD-10-CM | POA: Diagnosis not present

## 2019-10-13 DIAGNOSIS — G309 Alzheimer's disease, unspecified: Secondary | ICD-10-CM

## 2019-10-13 DIAGNOSIS — I619 Nontraumatic intracerebral hemorrhage, unspecified: Secondary | ICD-10-CM

## 2019-10-13 DIAGNOSIS — Z954 Presence of other heart-valve replacement: Secondary | ICD-10-CM | POA: Diagnosis not present

## 2019-10-13 DIAGNOSIS — I10 Essential (primary) hypertension: Secondary | ICD-10-CM

## 2019-10-13 DIAGNOSIS — R69 Illness, unspecified: Secondary | ICD-10-CM | POA: Diagnosis not present

## 2019-10-13 DIAGNOSIS — D689 Coagulation defect, unspecified: Secondary | ICD-10-CM | POA: Diagnosis not present

## 2019-10-13 DIAGNOSIS — E785 Hyperlipidemia, unspecified: Secondary | ICD-10-CM

## 2019-10-13 DIAGNOSIS — M810 Age-related osteoporosis without current pathological fracture: Secondary | ICD-10-CM | POA: Diagnosis not present

## 2019-10-13 DIAGNOSIS — I69351 Hemiplegia and hemiparesis following cerebral infarction affecting right dominant side: Secondary | ICD-10-CM | POA: Diagnosis not present

## 2019-10-13 DIAGNOSIS — M069 Rheumatoid arthritis, unspecified: Secondary | ICD-10-CM | POA: Diagnosis not present

## 2019-10-13 DIAGNOSIS — F0150 Vascular dementia without behavioral disturbance: Secondary | ICD-10-CM

## 2019-10-13 NOTE — Progress Notes (Signed)
[facility] [address] [place] [address] [phone]       HOSPITAL FOLLOW UP NOTE  [name] Date of Birth:  August 21, 1929 Medical Record Number:  [id number]   Reason for Referral:  hospital stroke follow up    CHIEF COMPLAINT:  Chief Complaint  Patient presents with   Follow-up    Treatment room. With daughter. No concerns. Doing well.    HPI: Virginia Franklin being seen today for in office hospital follow-up regarding [place] secondary to HTN in setting of Coumadin use on 09/04/2019.  History obtained from patient, daughter and chart review. Reviewed all radiology images and labs personally.  Virginia Franklin a 85 y.o.femalewith history of mechanical heart valve replacement 50 years ago, on Coumadin, HTN, RA and hx of stroke 2012 who presented on 09/04/2019 to the ED with acute onset of confusion, right sided numbness, difficulty ambulatingand speech difficulties. CT -small acute left thalamic hemorrhage likely due to HTN in the setting of Coumadin use. - INR 2.5 -> K-Centra and vitamin K.Shedid not receive IV t-PA due to [place].  Repeat CT head showed slight interval increase of left thalamocapsular junction ICH.  CTA head/neck showed dolichoectasia of the anterior and posterior circulation correlate for history of chronic hypertension without evidence of AVM or aneurysm.  Repeat CT head 09/12/2019 showed unchanged size of intraparenchymal hematoma of the dorsolateral left thalamus, no new site of hemorrhage, chronic microvascular ischemia and generalized atrophy.  2D echo showed an EF of 60 to 65% with normal RV size and systolic function with St Jude mechanical aortic valve functioning normally.  Restarted on warfarin with INR goal 2.5-3.0 with history of mechanical aortic valve.  HTN stable restarting on furosemide and Zestril.  Continuation of Vytorin for HLD management.  Other stroke risk factors include advanced age,  former tobacco use, EtOH use and history of stroke 2012.  Other active bowel include delirium and sundowning improved after initiation of Seroquel.  Therapies recommended CIR but insurance declined therefore recommended SNF but family declined and opted to bring patient home with home health therapies with daughter.  Discharged home on 09/14/2019 in stable condition.  Virginia Franklin is a 84 year old female who is being seen today for hospital follow-up accompanied by her daughter.  She has been doing well since discharge but continues to experience ambulation difficulties, slightly worsening cognitive impairment from baseline and occasional dysarthria.  She does have known bilateral knee arthritis which has also been interfering with ambulation.  Currently using rolling walker and denies any falls.  Continues to live with her daughter with hopes of returning to [facility] independent living in the near future.  Currently receiving home health PT/OT through [facility] but has not received any speech therapy.  She has continued on Seroquel due to agitation during admission but daughter questions whether this needs to continue as she has not showed any behaviors.  She continues on Namenda 10 mg twice daily as well as Lexapro 5 mg daily.  Continues on Coumadin with ongoing monitoring and management by cardiology.  Remains on Vytorin for HLD management.  Blood pressure today satisfactory 116/69.  No further concerns at this time.     ROS:   14 system review of systems performed and negative with exception of memory loss, confusion, joint pain  PMH:  Past Medical History:  Diagnosis Date   Body mass index (BMI) of 22.0-22.9 in adult    Cervical cancer ([place])    H/O  cervical fracture    H/O osteoporosis    H/O rheumatoid arthritis    H/O: CVA (cerebrovascular accident) 2012   H/O: osteoarthritis    History of urinary frequency    Hx of diverticulitis of colon    Hypertension      PSH:  Past Surgical History:  Procedure Laterality Date   ABDOMINAL HYSTERECTOMY     CARDIAC SURGERY     CATARACT EXTRACTION     SMALL INTESTINE SURGERY      Social History:  Social History   Socioeconomic History   Marital status: Widowed    Spouse name: Not on file   Number of children: 2   Years of education: 14   Highest education level: Not on file  Occupational History   Not on file  Social Needs   Financial resource strain: Not on file   Food insecurity    Worry: Not on file    Inability: Not on file   Transportation needs    Medical: Not on file    Non-medical: Not on file  Tobacco Use   Smoking status: Former Smoker   Smokeless tobacco: Never Used  Substance and Sexual Activity   Alcohol use: Yes    Comment: SOCIALLY   Drug use: No   Sexual activity: Not on file    Comment: WIDOW  Lifestyle   Physical activity    Days per week: Not on file    Minutes per session: Not on file   Stress: Not on file  Relationships   Social connections    Talks on phone: Not on file    Gets together: Not on file    Attends religious service: Not on file    Active member of club or organization: Not on file    Attends meetings of clubs or organizations: Not on file    Relationship status: Not on file   Intimate partner violence    Fear of current or ex partner: Not on file    Emotionally abused: Not on file    Physically abused: Not on file    Forced sexual activity: Not on file  Other Topics Concern   Not on file  Social History Narrative   Lives alone in a retirement home.  Has 2 children.  Retired Haematologist.  Education: Probation officer school.     Family History:  Family History  Problem Relation Age of Onset   Kidney disease Mother        MALIGNANT NEOPLASM   Arthritis Father    Heart Problems Father    Lung disease Brother    Heart disease Neg Hx     Medications:   Current Outpatient Medications on File Prior to Visit   Medication Sig Dispense Refill   escitalopram (LEXAPRO) 5 MG tablet Take 1 tablet (5 mg total) by mouth daily. 90 tablet 3   ezetimibe-simvastatin (VYTORIN) 10-40 MG tablet Take 1 tablet by mouth daily.     furosemide (LASIX) 20 MG tablet TAKE 1 TABLET DAILY 90 tablet 3   lisinopril (ZESTRIL) 10 MG tablet Take 1 tablet (10 mg total) by mouth daily. 90 tablet 3   memantine (NAMENDA) 10 MG tablet Take 10 mg by mouth 2 (two) times daily.     solifenacin (VESICARE) 5 MG tablet Take 5 mg by mouth daily.     warfarin (COUMADIN) 7.5 MG tablet Take 0.5-1 tablets (3.75-7.5 mg total) by mouth See admin instructions. At d/c from hospital 11/2 - take 7.5 daily at  6p. Next lab draw 11/4. Adjust dose per coumadin clinic     No current facility-administered medications on file prior to visit.     Allergies:   Allergies  Allergen Reactions   Penicillins Anaphylaxis and Swelling    Has patient had a PCN reaction causing immediate rash, facial/tongue/throat swelling, SOB or lightheadedness with hypotension: Yes Has patient had a PCN reaction causing severe rash involving mucus membranes or skin necrosis: No Has patient had a PCN reaction that required hospitalization: No Has patient had a PCN reaction occurring within the last 10 years: No If all of the above answers are "NO", then may proceed with Cephalosporin use.     Physical Exam  Vitals:   10/13/19 0814  BP: 116/69  Pulse: 78  Temp: (!) 97.1 F (36.2 C)  Weight: 133 lb 12.8 oz (60.7 kg)  Height: 5\' 7"  (1.702 m)   Body mass index is 20.96 kg/m. No exam data present  Depression screen [facility] 2/9 10/13/2019  Decreased Interest 0  Down, Depressed, Hopeless 0  PHQ - 2 Score 0  Altered sleeping 0  Tired, decreased energy 3  Change in appetite 0  Feeling bad or failure about yourself  0  Trouble concentrating 0  Moving slowly or fidgety/restless 2  Suicidal thoughts 0  PHQ-9 Score 5     General: well developed, well nourished,   pleasant elderly Caucasian female, seated, in no evident distress Head: head normocephalic and atraumatic.   Neck: supple with no carotid or supraclavicular bruits Cardiovascular: regular rate and rhythm, mechanical valve click Musculoskeletal: no deformity Skin:  no rash/petichiae Vascular:  Normal pulses all extremities   Neurologic Exam Mental Status: Awake and fully alert.   Unable to appreciate speech or language deficit.  Oriented to place and time. Recent and remote memory slightly diminished. Attention span, concentration and fund of knowledge slightly diminished. Mood and affect appropriate.  Cranial Nerves: Fundoscopic exam reveals sharp disc margins. Pupils equal, briskly reactive to light. Extraocular movements full without nystagmus. Visual fields full to confrontation. Hearing intact. Facial sensation intact. Face, tongue, palate moves normally and symmetrically.  Motor: Normal bulk and tone.  Generalized mild weakness likely greater to arthritic joint pain Sensory.: intact to touch , pinprick , position and vibratory sensation.  Coordination: Rapid alternating movements decreased but equal in extremities. Finger-to-nose and heel-to-shin performed accurately bilaterally. Gait and Station: Arises from chair without difficulty. Stance is normal. Gait demonstrates normal stride length with mild imbalance and use of Rollator walker Reflexes: 1+ and symmetric. Toes downgoing.     NIHSS  0 Modified Rankin  3    Diagnostic Data (Labs, Imaging, Testing)  Ct Head Code Stroke Wo Contrast 09/04/2019 I1. Acute hemorrhage involving the left lateral thalamus and posterior limb internal capsule. Estimated blood volume less than 1 mL.  2.Atrophy and chronic microvascular ischemia.   Ct Angio Neck W Or Wo Contrast 09/04/2019 The bilateral common carotid, internal carotid and vertebral arteries are patent within the neck without significant stenosis (50% or greater). Atherosclerotic  disease within the major branch vessels of the neck, most notably as follows. Mild calcified plaque at the right carotid bifurcation and within the proximal right ICA. Plaque at the origin of the non dominant right vertebral artery results in mild/moderate ostial stenosis.   Ct Angio Head W Or Wo Contrast 09/04/2019 1. No intracranial aneurysm is identified.  2. No large vessel occlusion or proximal high-grade arterial stenosis. Mild calcified plaque within the carotid artery siphons and  intracranial vertebral arteries bilaterally.  3. 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 Correlate for history of chronic hypertension.   Dg Chest Port 1 View 09/04/2019 No active cardiopulmonary disease.   CT Head WO Contrast 09/05/2019 1. Slight interval increase in size of an acute left thalamocapsular junction parenchymal hemorrhage, now measuring 6 x 12 x 22 mm (previously 6 x 12 x 18 mm). 2. Generalized parenchymal atrophy and chronic small vessel ischemic disease.  CT Head WO Contrast 09/12/2019 1. Unchanged size of intraparenchymal hematoma of the dorsolateral left thalamus. 2. No new site of hemorrhage. 3. Chronic microvascular ischemia and generalized atrophy.  Transthoracic Echocardiogram  09/08/19 1. Left ventricular ejection fraction, by visual estimation, is 60 to 65%. The left ventricle has normal function. Normal left ventricular size. There is mildly increased left ventricular hypertrophy. 2. Abnormal septal motion consistent with post-operative status. 3. Left ventricular diastolic Doppler parameters are consistent with impaired relaxation pattern of LV diastolic filling. 4. Global right ventricle has normal systolic function.The right ventricular size is normal. No increase in right ventricular wall thickness. 5. Left atrial size was normal. 6. Right atrial size was normal. 7. Mild mitral annular calcification. 8. The mitral valve is normal in  structure. No evidence of mitral valve regurgitation. 9. The tricuspid valve is grossly normal. Tricuspid valve regurgitation was not visualized by color flow Doppler. 10. Bileaflet mechanical prosthesis in the aortic valve position. 11. Aortic valve regurgitation is trivial ("physiological" backwash) by color flow Doppler. 12. Aortic prosthesis gradients are unchanged since 2019. 13. The pulmonic valve was not well visualized. Pulmonic valve regurgitation is not visualized by color flow Doppler. 14. TR signal is inadequate for assessing pulmonary artery systolic pressure. 15. The inferior vena cava is normal in size with greater than 50% respiratory variability, suggesting right atrial pressure of 3 mmHg.    ASSESSMENT: Virginia Franklin is a 85 y.o. year old female presented with acute onset confusion, right-sided numbness, speech difficulties and difficulty ambulating on 09/04/2019 with stroke work-up revealing small left lateral thalamus and posterior limb internal capsule ICH likely secondary to HTN in setting of Coumadin use. Vascular risk factors include mechanical heart valve replacement 50 years ago on Coumadin, HTN, HLD and history of stroke.  Residual deficits of worsening cognition from baseline, occasional dysarthria and mild imbalance    PLAN:  1. ICH: Continue warfarin daily  and Vytorin for secondary stroke prevention. Maintain strict control of hypertension with blood pressure goal below 130/90, diabetes with hemoglobin A1c goal below 6.5% and cholesterol with LDL cholesterol (bad cholesterol) goal below 70 mg/dL.  I also advised the patient to eat a healthy diet with plenty of whole grains, cereals, fruits and vegetables, exercise regularly with at least 30 minutes of continuous activity daily and maintain ideal body weight. 2. HTN: Advised to continue current treatment regimen.  Today's BP stable.  Advised to continue to monitor at home along with continued follow-up with  PCP for management 3. HLD: Advised to continue current treatment regimen along with continued follow-up with PCP for future prescribing and monitoring of lipid panel 4. Mechanical heart valve: Continuation of Coumadin with INR goal 2.5-3.0 and ongoing fall cardiology for monitoring and management 5. Residual deficits: Ongoing participation with home health physical and Occupational Therapy and recommend initiating speech therapy for occasional speech difficulties and cognitive impairment 6. Cognitive impairment: Underlying baseline dementia worsened after recent stroke.  Due to lack of behavioral concerns, recommend trialing discontinue Seroquel    Follow up in  3 months or call earlier if needed   Greater than 50% of time during this 45 minute visit was spent on counseling, explanation of diagnosis of ICH, reviewing risk factor management of HTN, HLD mechanical heart valve, planning of further management along with potential future management, and discussion with patient and family answering all questions.    Frann Rider, AGNP-BC  [facility] [address] [place], [place] [address]  Phone [phone] Fax [phone] Note: This document was prepared with digital dictation and possible smart phrase technology. Any transcriptional errors that result from this process are unintentional.

## 2019-10-13 NOTE — Patient Instructions (Signed)
Continue to work with home health physical and occupational therapy and would recommend initiating speech/cognitive therapy -order will be placed  Discontinue Seroquel at this time as no longer needed  Continue warfarin daily  and Vytorin for secondary stroke prevention  Continue to follow up with PCP regarding cholesterol and blood pressure management   Continue to follow with cardiology regularly for cardiac conditions and monitoring/management on   Continue to monitor blood pressure at home  Maintain strict control of hypertension with blood pressure goal below 130/90, diabetes with hemoglobin A1c goal below 6.5% and cholesterol with LDL cholesterol (bad cholesterol) goal below 70 mg/dL. I also advised the patient to eat a healthy diet with plenty of whole grains, cereals, fruits and vegetables, exercise regularly and maintain ideal body weight.  Followup in the future with me in 3 months or call earlier if needed       Thank you for coming to see [place] at [facility]. I hope we have been able to provide you high quality care today.  You may receive a patient satisfaction survey over the next few weeks. We would appreciate your feedback and comments so that we may continue to improve ourselves and the health of our patients.

## 2019-10-14 DIAGNOSIS — Z954 Presence of other heart-valve replacement: Secondary | ICD-10-CM | POA: Diagnosis not present

## 2019-10-14 DIAGNOSIS — G309 Alzheimer's disease, unspecified: Secondary | ICD-10-CM | POA: Diagnosis not present

## 2019-10-14 DIAGNOSIS — Z7901 Long term (current) use of anticoagulants: Secondary | ICD-10-CM | POA: Diagnosis not present

## 2019-10-14 DIAGNOSIS — I10 Essential (primary) hypertension: Secondary | ICD-10-CM | POA: Diagnosis not present

## 2019-10-14 DIAGNOSIS — M069 Rheumatoid arthritis, unspecified: Secondary | ICD-10-CM | POA: Diagnosis not present

## 2019-10-14 DIAGNOSIS — R269 Unspecified abnormalities of gait and mobility: Secondary | ICD-10-CM | POA: Diagnosis not present

## 2019-10-14 DIAGNOSIS — R69 Illness, unspecified: Secondary | ICD-10-CM | POA: Diagnosis not present

## 2019-10-14 DIAGNOSIS — M810 Age-related osteoporosis without current pathological fracture: Secondary | ICD-10-CM | POA: Diagnosis not present

## 2019-10-14 DIAGNOSIS — I69351 Hemiplegia and hemiparesis following cerebral infarction affecting right dominant side: Secondary | ICD-10-CM | POA: Diagnosis not present

## 2019-10-14 NOTE — Progress Notes (Signed)
I agree with the above plan 

## 2019-10-15 DIAGNOSIS — M810 Age-related osteoporosis without current pathological fracture: Secondary | ICD-10-CM | POA: Diagnosis not present

## 2019-10-15 DIAGNOSIS — G309 Alzheimer's disease, unspecified: Secondary | ICD-10-CM | POA: Diagnosis not present

## 2019-10-15 DIAGNOSIS — R69 Illness, unspecified: Secondary | ICD-10-CM | POA: Diagnosis not present

## 2019-10-15 DIAGNOSIS — I69351 Hemiplegia and hemiparesis following cerebral infarction affecting right dominant side: Secondary | ICD-10-CM | POA: Diagnosis not present

## 2019-10-15 DIAGNOSIS — Z7901 Long term (current) use of anticoagulants: Secondary | ICD-10-CM | POA: Diagnosis not present

## 2019-10-15 DIAGNOSIS — M069 Rheumatoid arthritis, unspecified: Secondary | ICD-10-CM | POA: Diagnosis not present

## 2019-10-15 DIAGNOSIS — Z954 Presence of other heart-valve replacement: Secondary | ICD-10-CM | POA: Diagnosis not present

## 2019-10-15 DIAGNOSIS — I10 Essential (primary) hypertension: Secondary | ICD-10-CM | POA: Diagnosis not present

## 2019-10-16 ENCOUNTER — Telehealth: Payer: Self-pay | Admitting: Family Medicine

## 2019-10-16 NOTE — Telephone Encounter (Signed)
Left message to return phone call.

## 2019-10-16 NOTE — Telephone Encounter (Signed)
Home Health Verbal Orders - Caller/Agency: Dianah Field with [facility] Number: [phone] Requesting OT/PT/Skilled Nursing/Social Work/Speech Therapy: Need verbals for PT  Frequency: 2 week 4

## 2019-10-16 NOTE — Telephone Encounter (Signed)
Okay for verbal orders? Please advise 

## 2019-10-17 ENCOUNTER — Encounter: Payer: Self-pay | Admitting: Family Medicine

## 2019-10-18 DIAGNOSIS — I69351 Hemiplegia and hemiparesis following cerebral infarction affecting right dominant side: Secondary | ICD-10-CM | POA: Diagnosis not present

## 2019-10-18 DIAGNOSIS — M810 Age-related osteoporosis without current pathological fracture: Secondary | ICD-10-CM | POA: Diagnosis not present

## 2019-10-18 DIAGNOSIS — Z954 Presence of other heart-valve replacement: Secondary | ICD-10-CM | POA: Diagnosis not present

## 2019-10-18 DIAGNOSIS — G309 Alzheimer's disease, unspecified: Secondary | ICD-10-CM | POA: Diagnosis not present

## 2019-10-18 DIAGNOSIS — Z7901 Long term (current) use of anticoagulants: Secondary | ICD-10-CM | POA: Diagnosis not present

## 2019-10-18 DIAGNOSIS — R69 Illness, unspecified: Secondary | ICD-10-CM | POA: Diagnosis not present

## 2019-10-18 DIAGNOSIS — I10 Essential (primary) hypertension: Secondary | ICD-10-CM | POA: Diagnosis not present

## 2019-10-18 DIAGNOSIS — M069 Rheumatoid arthritis, unspecified: Secondary | ICD-10-CM | POA: Diagnosis not present

## 2019-10-19 ENCOUNTER — Encounter: Payer: Self-pay | Admitting: Family Medicine

## 2019-10-19 DIAGNOSIS — M159 Polyosteoarthritis, unspecified: Secondary | ICD-10-CM

## 2019-10-19 NOTE — Telephone Encounter (Signed)
Please okay the orders  

## 2019-10-20 ENCOUNTER — Other Ambulatory Visit: Payer: Self-pay

## 2019-10-20 ENCOUNTER — Ambulatory Visit (INDEPENDENT_AMBULATORY_CARE_PROVIDER_SITE_OTHER): Payer: Medicare HMO | Admitting: *Deleted

## 2019-10-20 DIAGNOSIS — Z5181 Encounter for therapeutic drug level monitoring: Secondary | ICD-10-CM | POA: Diagnosis not present

## 2019-10-20 DIAGNOSIS — Z7901 Long term (current) use of anticoagulants: Secondary | ICD-10-CM | POA: Diagnosis not present

## 2019-10-20 DIAGNOSIS — Z952 Presence of prosthetic heart valve: Secondary | ICD-10-CM | POA: Diagnosis not present

## 2019-10-20 LAB — POCT INR: INR: 3.6 — AB (ref 2.0–3.0)

## 2019-10-20 MED ORDER — TRAMADOL HCL 50 MG PO TABS: 100.0000 mg | ORAL_TABLET | Freq: Four times a day (QID) | ORAL | 2 refills | Status: DC | PRN

## 2019-10-20 NOTE — Telephone Encounter (Signed)
Called [facility]. LVM for okay for verbal order

## 2019-10-20 NOTE — Telephone Encounter (Signed)
Please advise 

## 2019-10-20 NOTE — Patient Instructions (Signed)
Description   Hold today, then start taking  1 tablet daily except for 1/2 tablet on Sundays, Tuesday and Thursdays. Call Coumadin clinic with questions [phone].  Recheck INR in 2 weeks. Pt is staying with daughter right now.

## 2019-10-20 NOTE — Telephone Encounter (Signed)
This was Tramadol, and I sent her in a supply  to try

## 2019-10-20 NOTE — Telephone Encounter (Signed)
I referred her to [facility]

## 2019-10-20 NOTE — Telephone Encounter (Signed)
Please advise on medication

## 2019-10-27 ENCOUNTER — Inpatient Hospital Stay: Payer: Medicare HMO | Admitting: Adult Health

## 2019-10-29 ENCOUNTER — Encounter: Payer: Self-pay | Admitting: Family Medicine

## 2019-10-30 NOTE — Telephone Encounter (Signed)
I already made this referral on 10-20-19. Please have Hilda Blades look into this

## 2019-11-02 ENCOUNTER — Other Ambulatory Visit: Payer: Self-pay

## 2019-11-02 ENCOUNTER — Ambulatory Visit (INDEPENDENT_AMBULATORY_CARE_PROVIDER_SITE_OTHER): Payer: Medicare HMO

## 2019-11-02 DIAGNOSIS — Z7901 Long term (current) use of anticoagulants: Secondary | ICD-10-CM

## 2019-11-02 DIAGNOSIS — Z5181 Encounter for therapeutic drug level monitoring: Secondary | ICD-10-CM | POA: Diagnosis not present

## 2019-11-02 DIAGNOSIS — Z952 Presence of prosthetic heart valve: Secondary | ICD-10-CM

## 2019-11-02 LAB — POCT INR: INR: 3 (ref 2.0–3.0)

## 2019-11-02 NOTE — Patient Instructions (Signed)
Description   Continue on same dosage 1 tablet daily except for 1/2 tablet on Sundays, Tuesday and Thursdays. Call Coumadin clinic with questions [phone].  Recheck INR in 4 weeks. Pt is staying with daughter right now.

## 2019-11-02 NOTE — Telephone Encounter (Signed)
Sent to [employer] for assistance.

## 2019-11-10 DIAGNOSIS — Z03818 Encounter for observation for suspected exposure to other biological agents ruled out: Secondary | ICD-10-CM | POA: Diagnosis not present

## 2019-11-11 ENCOUNTER — Encounter: Payer: Self-pay | Admitting: Family Medicine

## 2019-11-14 DIAGNOSIS — R269 Unspecified abnormalities of gait and mobility: Secondary | ICD-10-CM | POA: Diagnosis not present

## 2019-11-17 DIAGNOSIS — I69351 Hemiplegia and hemiparesis following cerebral infarction affecting right dominant side: Secondary | ICD-10-CM | POA: Diagnosis not present

## 2019-11-18 ENCOUNTER — Encounter: Payer: Self-pay | Admitting: Family Medicine

## 2019-11-18 NOTE — Telephone Encounter (Signed)
Last referral was placed on 10/20/2019. Patient refused services and the referral was closed. Ok to place a new referral?

## 2019-11-18 NOTE — Telephone Encounter (Signed)
I had already referred her to [facility], but apparently the referral was never processed. I spoke to Neoma Laming and she is now putting a stat referral through to them

## 2019-11-25 ENCOUNTER — Encounter: Payer: Self-pay | Admitting: Family Medicine

## 2019-11-25 NOTE — Telephone Encounter (Signed)
Yes she should get the vaccine  

## 2019-11-26 DIAGNOSIS — M2550 Pain in unspecified joint: Secondary | ICD-10-CM | POA: Diagnosis not present

## 2019-11-26 DIAGNOSIS — M150 Primary generalized (osteo)arthritis: Secondary | ICD-10-CM | POA: Diagnosis not present

## 2019-11-26 DIAGNOSIS — Z6822 Body mass index (BMI) 22.0-22.9, adult: Secondary | ICD-10-CM | POA: Diagnosis not present

## 2019-11-30 ENCOUNTER — Ambulatory Visit (INDEPENDENT_AMBULATORY_CARE_PROVIDER_SITE_OTHER): Payer: Medicare HMO | Admitting: Podiatry

## 2019-11-30 ENCOUNTER — Other Ambulatory Visit: Payer: Self-pay

## 2019-11-30 ENCOUNTER — Encounter: Payer: Self-pay | Admitting: Podiatry

## 2019-11-30 DIAGNOSIS — B351 Tinea unguium: Secondary | ICD-10-CM

## 2019-11-30 DIAGNOSIS — M79675 Pain in left toe(s): Secondary | ICD-10-CM | POA: Diagnosis not present

## 2019-11-30 DIAGNOSIS — L84 Corns and callosities: Secondary | ICD-10-CM

## 2019-11-30 DIAGNOSIS — D689 Coagulation defect, unspecified: Secondary | ICD-10-CM

## 2019-11-30 DIAGNOSIS — M79674 Pain in right toe(s): Secondary | ICD-10-CM

## 2019-12-01 NOTE — Progress Notes (Signed)
Subjective:   Patient ID: Virginia Franklin, female   DOB: 86 y.o.   MRN: [id number]   HPI Patient presents with chronic nail disease 1-5 both feet digital deformities and lesions that are painful and make shoe gear difficult that she cannot cut.  States she is on blood thinner and cannot take a chance of cutting herself   ROS      Objective:  Physical Exam  Neurovascular status was found to be unchanged from previous visit with moderate diminishment with patient found to have thick yellow brittle nailbeds 1-5 both feet and lesion formation bilateral that are painful and make shoe gear difficult along with history of blood thinners     Assessment:  At risk patient with mycotic nail infection painful bilateral and lesions of the hallux third digit bilateral painful     Plan:  H&P reviewed conditions debrided nailbeds 1-5 both feet and lesions on both feet with no iatrogenic bleeding noted.  Patient tolerated well and will be seen back to recheck

## 2019-12-02 ENCOUNTER — Ambulatory Visit (INDEPENDENT_AMBULATORY_CARE_PROVIDER_SITE_OTHER): Payer: Medicare HMO | Admitting: *Deleted

## 2019-12-02 ENCOUNTER — Other Ambulatory Visit: Payer: Self-pay

## 2019-12-02 DIAGNOSIS — Z952 Presence of prosthetic heart valve: Secondary | ICD-10-CM

## 2019-12-02 DIAGNOSIS — Z7901 Long term (current) use of anticoagulants: Secondary | ICD-10-CM | POA: Diagnosis not present

## 2019-12-02 DIAGNOSIS — Z5181 Encounter for therapeutic drug level monitoring: Secondary | ICD-10-CM | POA: Diagnosis not present

## 2019-12-02 LAB — POCT INR: INR: 2.6 (ref 2.0–3.0)

## 2019-12-02 NOTE — Patient Instructions (Addendum)
Description   Continue on same dosage 1 tablet daily except for 1/2 tablet on Sundays, Tuesday and Thursdays. Call Coumadin clinic with questions [phone].  Recheck INR in 5 weeks. Called [facility] and gave her dosing instructions [phone] because she fills pt's pill box.

## 2019-12-18 ENCOUNTER — Encounter: Payer: Self-pay | Admitting: Adult Health

## 2019-12-18 DIAGNOSIS — I69322 Dysarthria following cerebral infarction: Secondary | ICD-10-CM

## 2019-12-18 DIAGNOSIS — I69398 Other sequelae of cerebral infarction: Secondary | ICD-10-CM

## 2019-12-18 DIAGNOSIS — R269 Unspecified abnormalities of gait and mobility: Secondary | ICD-10-CM

## 2019-12-23 NOTE — Addendum Note (Signed)
Addended by: Mal Misty on: 12/23/2019 04:45 PM   Modules accepted: Orders

## 2020-01-02 ENCOUNTER — Other Ambulatory Visit: Payer: Self-pay | Admitting: Family Medicine

## 2020-01-06 ENCOUNTER — Ambulatory Visit (INDEPENDENT_AMBULATORY_CARE_PROVIDER_SITE_OTHER): Payer: Medicare HMO

## 2020-01-06 ENCOUNTER — Other Ambulatory Visit: Payer: Self-pay

## 2020-01-06 DIAGNOSIS — Z952 Presence of prosthetic heart valve: Secondary | ICD-10-CM

## 2020-01-06 DIAGNOSIS — Z7901 Long term (current) use of anticoagulants: Secondary | ICD-10-CM | POA: Diagnosis not present

## 2020-01-06 DIAGNOSIS — Z5181 Encounter for therapeutic drug level monitoring: Secondary | ICD-10-CM | POA: Diagnosis not present

## 2020-01-06 LAB — POCT INR: INR: 2.2 (ref 2.0–3.0)

## 2020-01-06 NOTE — Patient Instructions (Signed)
Description   Take 1 tablet today and tomorrow, then resume same dosage 1 tablet daily except for 1/2 tablet on Sundays, Tuesday and Thursdays. Call Coumadin clinic with questions [phone].  Recheck INR in 5 weeks. Called [facility] and gave her dosing instructions [phone] because she fills pt's pill box.

## 2020-01-13 ENCOUNTER — Telehealth: Payer: Self-pay | Admitting: Family Medicine

## 2020-01-13 NOTE — Telephone Encounter (Signed)
Virginia Franklin from [facility] will be faxing over a request to receive pt's medication list.   Will try to keep a look out for it.

## 2020-01-14 NOTE — Telephone Encounter (Signed)
Forms received, completed and faxed back.

## 2020-01-15 ENCOUNTER — Other Ambulatory Visit: Payer: Self-pay

## 2020-01-17 ENCOUNTER — Encounter (HOSPITAL_COMMUNITY): Payer: Self-pay

## 2020-01-17 ENCOUNTER — Inpatient Hospital Stay (HOSPITAL_COMMUNITY)
Admission: EM | Admit: 2020-01-17 | Discharge: 2020-01-22 | DRG: 544 | Disposition: A | Discharge status: Skilled Nursing Facility | Payer: Medicare HMO | Attending: Internal Medicine | Admitting: Internal Medicine

## 2020-01-17 ENCOUNTER — Emergency Department (HOSPITAL_COMMUNITY): Payer: Medicare HMO

## 2020-01-17 DIAGNOSIS — Z8249 Family history of ischemic heart disease and other diseases of the circulatory system: Secondary | ICD-10-CM

## 2020-01-17 DIAGNOSIS — Z20822 Contact with and (suspected) exposure to covid-19: Secondary | ICD-10-CM | POA: Diagnosis present

## 2020-01-17 DIAGNOSIS — M810 Age-related osteoporosis without current pathological fracture: Secondary | ICD-10-CM | POA: Diagnosis present

## 2020-01-17 DIAGNOSIS — R791 Abnormal coagulation profile: Secondary | ICD-10-CM

## 2020-01-17 DIAGNOSIS — M4856XA Collapsed vertebra, not elsewhere classified, lumbar region, initial encounter for fracture: Principal | ICD-10-CM | POA: Diagnosis present

## 2020-01-17 DIAGNOSIS — Z7901 Long term (current) use of anticoagulants: Secondary | ICD-10-CM

## 2020-01-17 DIAGNOSIS — Z87891 Personal history of nicotine dependence: Secondary | ICD-10-CM

## 2020-01-17 DIAGNOSIS — F418 Other specified anxiety disorders: Secondary | ICD-10-CM | POA: Diagnosis not present

## 2020-01-17 DIAGNOSIS — G309 Alzheimer's disease, unspecified: Secondary | ICD-10-CM | POA: Diagnosis present

## 2020-01-17 DIAGNOSIS — E559 Vitamin D deficiency, unspecified: Secondary | ICD-10-CM | POA: Diagnosis present

## 2020-01-17 DIAGNOSIS — Y92009 Unspecified place in unspecified non-institutional (private) residence as the place of occurrence of the external cause: Secondary | ICD-10-CM

## 2020-01-17 DIAGNOSIS — Z952 Presence of prosthetic heart valve: Secondary | ICD-10-CM

## 2020-01-17 DIAGNOSIS — S32030A Wedge compression fracture of third lumbar vertebra, initial encounter for closed fracture: Secondary | ICD-10-CM | POA: Diagnosis not present

## 2020-01-17 DIAGNOSIS — G300 Alzheimer's disease with early onset: Secondary | ICD-10-CM

## 2020-01-17 DIAGNOSIS — M069 Rheumatoid arthritis, unspecified: Secondary | ICD-10-CM | POA: Diagnosis present

## 2020-01-17 DIAGNOSIS — K5900 Constipation, unspecified: Secondary | ICD-10-CM | POA: Diagnosis present

## 2020-01-17 DIAGNOSIS — Z8051 Family history of malignant neoplasm of kidney: Secondary | ICD-10-CM

## 2020-01-17 DIAGNOSIS — Z8541 Personal history of malignant neoplasm of cervix uteri: Secondary | ICD-10-CM

## 2020-01-17 DIAGNOSIS — X58XXXA Exposure to other specified factors, initial encounter: Secondary | ICD-10-CM | POA: Diagnosis present

## 2020-01-17 DIAGNOSIS — Z880 Allergy status to penicillin: Secondary | ICD-10-CM

## 2020-01-17 DIAGNOSIS — Z8673 Personal history of transient ischemic attack (TIA), and cerebral infarction without residual deficits: Secondary | ICD-10-CM

## 2020-01-17 DIAGNOSIS — R52 Pain, unspecified: Secondary | ICD-10-CM

## 2020-01-17 DIAGNOSIS — I10 Essential (primary) hypertension: Secondary | ICD-10-CM | POA: Diagnosis present

## 2020-01-17 DIAGNOSIS — Z66 Do not resuscitate: Secondary | ICD-10-CM | POA: Diagnosis present

## 2020-01-17 DIAGNOSIS — F0280 Dementia in other diseases classified elsewhere without behavioral disturbance: Secondary | ICD-10-CM | POA: Diagnosis present

## 2020-01-17 DIAGNOSIS — Z8261 Family history of arthritis: Secondary | ICD-10-CM

## 2020-01-17 DIAGNOSIS — Z79899 Other long term (current) drug therapy: Secondary | ICD-10-CM

## 2020-01-17 DIAGNOSIS — M2578 Osteophyte, vertebrae: Secondary | ICD-10-CM | POA: Diagnosis present

## 2020-01-17 LAB — COMPREHENSIVE METABOLIC PANEL
ALT: 15 U/L (ref 0–44)
AST: 17 U/L (ref 15–41)
Albumin: 4.1 g/dL (ref 3.5–5.0)
Alkaline Phosphatase: 111 U/L (ref 38–126)
Anion gap: 11 (ref 5–15)
BUN: 27 mg/dL — ABNORMAL HIGH (ref 8–23)
CO2: 26 mmol/L (ref 22–32)
Calcium: 9.3 mg/dL (ref 8.9–10.3)
Chloride: 100 mmol/L (ref 98–111)
Creatinine, Ser: 0.88 mg/dL (ref 0.44–1.00)
GFR calc Af Amer: >60 mL/min (ref >60)
GFR calc non Af Amer: 59 mL/min — ABNORMAL LOW (ref >60)
Glucose, Bld: 103 mg/dL — ABNORMAL HIGH (ref 70–99)
Potassium: 3.6 mmol/L (ref 3.5–5.1)
Sodium: 137 mmol/L (ref 135–145)
Total Bilirubin: 0.8 mg/dL (ref 0.3–1.2)
Total Protein: 7.8 g/dL (ref 6.5–8.1)

## 2020-01-17 LAB — URINALYSIS, ROUTINE W REFLEX MICROSCOPIC
Bilirubin Urine: NEGATIVE
Glucose, UA: NEGATIVE mg/dL
Ketones, ur: 20 mg/dL — AB
Leukocytes,Ua: NEGATIVE
Nitrite: NEGATIVE
Protein, ur: NEGATIVE mg/dL
Specific Gravity, Urine: 1.023 (ref 1.005–1.030)
pH: 5 (ref 5.0–8.0)

## 2020-01-17 LAB — CBC WITH DIFFERENTIAL/PLATELET
Abs Immature Granulocytes: 0.03 10*3/uL (ref 0.00–0.07)
Basophils Absolute: 0 10*3/uL (ref 0.0–0.1)
Basophils Relative: 0 %
Eosinophils Absolute: 0.1 10*3/uL (ref 0.0–0.5)
Eosinophils Relative: 1 %
HCT: 43.8 % (ref 36.0–46.0)
Hemoglobin: 14 g/dL (ref 12.0–15.0)
Immature Granulocytes: 0 %
Lymphocytes Relative: 16 %
Lymphs Abs: 1.1 10*3/uL (ref 0.7–4.0)
MCH: 29.6 pg (ref 26.0–34.0)
MCHC: 32 g/dL (ref 30.0–36.0)
MCV: 92.6 fL (ref 80.0–100.0)
Monocytes Absolute: 0.7 10*3/uL (ref 0.1–1.0)
Monocytes Relative: 10 %
Neutro Abs: 4.9 10*3/uL (ref 1.7–7.7)
Neutrophils Relative %: 73 %
Platelets: 250 10*3/uL (ref 150–400)
RBC: 4.73 MIL/uL (ref 3.87–5.11)
RDW: 12.8 % (ref 11.5–15.5)
WBC: 6.8 10*3/uL (ref 4.0–10.5)
nRBC: 0 % (ref 0.0–0.2)

## 2020-01-17 LAB — PROTIME-INR
INR: 5.1 (ref 0.8–1.2)
Prothrombin Time: 47.5 seconds — ABNORMAL HIGH (ref 11.4–15.2)

## 2020-01-17 MED ORDER — FUROSEMIDE 20 MG PO TABS
20.0000 mg | ORAL_TABLET | Freq: Every day | ORAL | Status: DC
  Administered 2020-01-18 – 2020-01-22 (×5): 20 mg via ORAL
  Filled 2020-01-17 (×5): qty 1

## 2020-01-17 MED ORDER — EZETIMIBE-SIMVASTATIN 10-40 MG PO TABS
1.0000 | ORAL_TABLET | Freq: Every day | ORAL | Status: DC
  Administered 2020-01-18 – 2020-01-22 (×5): 1 via ORAL
  Filled 2020-01-17 (×5): qty 1

## 2020-01-17 MED ORDER — HYDROCODONE-ACETAMINOPHEN 5-325 MG PO TABS: 1.0000 | ORAL_TABLET | Freq: Once | ORAL | Status: DC

## 2020-01-17 MED ORDER — ACETAMINOPHEN 650 MG RE SUPP: 650.0000 mg | Freq: Four times a day (QID) | RECTAL | Status: DC | PRN

## 2020-01-17 MED ORDER — DOCUSATE SODIUM 100 MG PO CAPS
100.0000 mg | ORAL_CAPSULE | Freq: Two times a day (BID) | ORAL | Status: DC
  Administered 2020-01-17 – 2020-01-22 (×10): 100 mg via ORAL
  Filled 2020-01-17 (×10): qty 1

## 2020-01-17 MED ORDER — ESCITALOPRAM OXALATE 10 MG PO TABS
5.0000 mg | ORAL_TABLET | Freq: Every day | ORAL | Status: DC
  Administered 2020-01-18 – 2020-01-22 (×5): 5 mg via ORAL
  Filled 2020-01-17 (×5): qty 1

## 2020-01-17 MED ORDER — ONDANSETRON HCL 4 MG/2ML IJ SOLN: 4.0000 mg | Freq: Four times a day (QID) | INTRAMUSCULAR | Status: DC | PRN

## 2020-01-17 MED ORDER — POLYETHYLENE GLYCOL 3350 17 G PO PACK
17.0000 g | PACK | Freq: Every day | ORAL | Status: DC | PRN
  Administered 2020-01-20: 17 g via ORAL
  Filled 2020-01-17: qty 1

## 2020-01-17 MED ORDER — SODIUM CHLORIDE 0.9 % IV BOLUS
500.0000 mL | Freq: Once | INTRAVENOUS | Status: AC
  Administered 2020-01-17: 500 mL via INTRAVENOUS

## 2020-01-17 MED ORDER — TRAMADOL HCL 50 MG PO TABS
100.0000 mg | ORAL_TABLET | Freq: Two times a day (BID) | ORAL | Status: DC | PRN
  Administered 2020-01-18 – 2020-01-19 (×3): 100 mg via ORAL
  Filled 2020-01-17 (×4): qty 2

## 2020-01-17 MED ORDER — SODIUM CHLORIDE 0.9 % IV SOLN
INTRAVENOUS | Status: DC | PRN
  Administered 2020-01-17: 500 mL via INTRAVENOUS

## 2020-01-17 MED ORDER — ONDANSETRON HCL 4 MG PO TABS: 4.0000 mg | ORAL_TABLET | Freq: Four times a day (QID) | ORAL | Status: DC | PRN

## 2020-01-17 MED ORDER — HYDROCODONE-ACETAMINOPHEN 5-325 MG PO TABS
1.0000 | ORAL_TABLET | Freq: Once | ORAL | Status: AC
  Administered 2020-01-17: 1 via ORAL
  Filled 2020-01-17: qty 1

## 2020-01-17 MED ORDER — MORPHINE SULFATE (PF) 2 MG/ML IV SOLN: 2.0000 mg | INTRAVENOUS | Status: DC | PRN

## 2020-01-17 MED ORDER — MORPHINE SULFATE (PF) 4 MG/ML IV SOLN
4.0000 mg | Freq: Once | INTRAVENOUS | Status: AC
  Administered 2020-01-17: 4 mg via INTRAVENOUS
  Filled 2020-01-17 (×2): qty 1

## 2020-01-17 MED ORDER — DARIFENACIN HYDROBROMIDE ER 7.5 MG PO TB24
7.5000 mg | ORAL_TABLET | Freq: Every day | ORAL | Status: DC
  Administered 2020-01-18 – 2020-01-22 (×5): 7.5 mg via ORAL
  Filled 2020-01-17 (×5): qty 1

## 2020-01-17 MED ORDER — ACETAMINOPHEN 325 MG PO TABS
650.0000 mg | ORAL_TABLET | Freq: Four times a day (QID) | ORAL | Status: DC | PRN
  Administered 2020-01-19: 650 mg via ORAL
  Filled 2020-01-17 (×2): qty 2

## 2020-01-17 MED ORDER — WARFARIN - PHARMACIST DOSING INPATIENT: Freq: Every day | Status: DC

## 2020-01-17 MED ORDER — MEMANTINE HCL 10 MG PO TABS
10.0000 mg | ORAL_TABLET | Freq: Two times a day (BID) | ORAL | Status: DC
  Administered 2020-01-17 – 2020-01-22 (×10): 10 mg via ORAL
  Filled 2020-01-17 (×10): qty 1

## 2020-01-17 MED ORDER — HYDROCODONE-ACETAMINOPHEN 5-325 MG PO TABS
1.0000 | ORAL_TABLET | Freq: Four times a day (QID) | ORAL | Status: DC | PRN
  Administered 2020-01-22: 1 via ORAL
  Filled 2020-01-17: qty 1

## 2020-01-17 MED ORDER — LISINOPRIL 10 MG PO TABS
10.0000 mg | ORAL_TABLET | Freq: Every day | ORAL | Status: DC
  Administered 2020-01-18 – 2020-01-22 (×5): 10 mg via ORAL
  Filled 2020-01-17 (×5): qty 1

## 2020-01-17 NOTE — ED Notes (Signed)
CRITICAL VALUE STICKER  CRITICAL VALUE: INR of 5.1  RECEIVER (on-site recipient of call): Laray Corbit, [place] NOTIFIED: 01/17/20 @ 16:30  MESSENGER (representative from lab):  MD NOTIFIED: Cortni Couture, PA  TIME OF NOTIFICATION:16:31  RESPONSE: PA acknowledged INR

## 2020-01-17 NOTE — ED Triage Notes (Signed)
She cos to [place] from [facility] with c/o low back pain x 3 days. She was observed by staff there to be "part on and part off of her bed" [sic]. The pt. Assures [place] she did not fall. She also mentions it has been two days since she had a b.m. she arrives in no distress.

## 2020-01-17 NOTE — Progress Notes (Signed)
ANTICOAGULATION CONSULT NOTE - Initial Consult  Pharmacy Consult for warfarin Indication: Mechanical valve with target INR 2.5 to 3 per consult  Allergies  Allergen Reactions  . Penicillins Anaphylaxis and Swelling    Has patient had a PCN reaction causing immediate rash, facial/tongue/throat swelling, SOB or lightheadedness with hypotension: Yes Has patient had a PCN reaction causing severe rash involving mucus membranes or skin necrosis: No Has patient had a PCN reaction that required hospitalization: No Has patient had a PCN reaction occurring within the last 10 years: No If all of the above answers are "NO", then may proceed with Cephalosporin use.    Patient Measurements:   Heparin Dosing Weight:   Vital Signs: Temp: 98 F (36.7 C) (03/07 1238) Temp Source: Oral (03/07 1238) BP: 126/60 (03/07 1730) Pulse Rate: 73 (03/07 1730)  Labs: Recent Labs    01/17/20 1339 01/17/20 1349  HGB  --  14.0  HCT  --  43.8  PLT  --  250  LABPROT 47.5*  --   INR 5.1*  --   CREATININE  --  0.88    CrCl cannot be calculated (Unknown ideal weight.).   Medical History: Past Medical History:  Diagnosis Date  . Body mass index (BMI) of 22.0-22.9 in adult   . Cervical cancer ([place])   . H/O cervical fracture   . H/O osteoporosis   . H/O rheumatoid arthritis   . H/O: CVA (cerebrovascular accident) 2012  . H/O: osteoarthritis   . History of urinary frequency   . Hx of diverticulitis of colon   . Hypertension     Medications:  Scheduled:   Assessment: Pharmacy is consulted dose warfarin in 86 yo patient being admitted with back pain. Pt target INR is 2.5 to 3 due to hemorrhagic stroke.   Pt with several back fractures per ED notes. PMH includes cervical CA, osteoporosis, CVA, diverticulitis, HTN, and Alzheimer's.   Pt's home regimen is warfarin 3.75 mg PO on SunTuThur and 7.5 mg on all other days. This dosage is confirmed on med rec as well as most recent Coumadin clinic notes on  01/06/2020.  Today, 01/17/20  INR on admission is 5.1, supratherapeutic  Hgb 14.0, plt 250   No new noted DDI    Goal of Therapy:  INR 2.5-3 Monitor platelets by anticoagulation protocol: Yes   Plan:   No warfarin tonight due to supratherapeutic INR   Daily INR   Monitor for signs and symptoms of bleeding   Royetta Asal, PharmD, BCPS 01/17/2020 7:22 PM

## 2020-01-17 NOTE — ED Provider Notes (Signed)
[facility] DEPT Provider Note   CSN: [id number] Arrival date & time: 01/17/20  1213     History Chief Complaint  Patient presents with  . Back Pain    Virginia Franklin is a 86 y.o. female.  HPI   Pt is an 86 y/o female with a h/o cervical CA, osteoporosis, CVA, diverticulitis, HTN, who presents to the ED today for evaluation of back pain. Pain located to the lower back. She rates pain 10/10. States that pain has been present for "some time". She is not able to tell me how long she has had pain. She denies any falls. She denies any fevers, abd pain, chest pain. Denies loss of control or bowel or bladder function. She does report some constipation. Her last BM was a few days ago.   Discussed with patient's daughter, she states patient lives independently however has a h/o Alzheimers  Past Medical History:  Diagnosis Date  . Body mass index (BMI) of 22.0-22.9 in adult   . Cervical cancer ([place])   . H/O cervical fracture   . H/O osteoporosis   . H/O rheumatoid arthritis   . H/O: CVA (cerebrovascular accident) 2012  . H/O: osteoarthritis   . History of urinary frequency   . Hx of diverticulitis of colon   . Hypertension     Patient Active Problem List   Diagnosis Date Noted  . Acute spont intraparenchymal hemorrhage assoc w/ coagulopathy (HCC) L lateral thalamus and PLIC on warfarin [id number]  . Diverticulitis 03/29/2019  . Abdominal pain 03/29/2019  . Supratherapeutic INR 03/29/2019  . Kidney mass 03/29/2019  . Hyponatremia 03/29/2019  . Nausea 03/29/2019  . Constipation 03/29/2019  . Alzheimer's dementia ([place]) 01/05/2019  . Depression with anxiety 07/25/2018  . Altered mental status 12/16/2017  . Spells of speech arrest   . HTN (hypertension) 10/15/2017  . Dyslipidemia 10/15/2017  . Hx of completed stroke 10/15/2017  . Osteoarthritis 10/15/2017  . Encounter for therapeutic drug monitoring 09/13/2017  . Chronic anticoagulation  09/05/2017  . Aortic valve replaced 09/05/2017    Past Surgical History:  Procedure Laterality Date  . ABDOMINAL HYSTERECTOMY    . CARDIAC SURGERY    . CATARACT EXTRACTION    . SMALL INTESTINE SURGERY       OB History   No obstetric history on file.     Family History  Problem Relation Age of Onset  . Kidney disease Mother        MALIGNANT NEOPLASM  . Arthritis Father   . Heart Problems Father   . Lung disease Brother   . Heart disease Neg Hx     Social History   Tobacco Use  . Smoking status: Former Research scientist (life sciences)  . Smokeless tobacco: Never Used  Substance Use Topics  . Alcohol use: Yes    Comment: SOCIALLY  . Drug use: No    Home Medications Prior to Admission medications   Medication Sig Start Date End Date Taking? Authorizing Provider  escitalopram (LEXAPRO) 5 MG tablet TAKE 1 TABLET DAILY 01/04/20   [name], MD  ezetimibe-simvastatin (VYTORIN) 10-40 MG tablet Take 1 tablet by mouth daily.    [provider]  furosemide (LASIX) 20 MG tablet TAKE 1 TABLET DAILY 04/08/19   [name], MD  lisinopril (ZESTRIL) 10 MG tablet Take 1 tablet (10 mg total) by mouth daily. 10/02/19   [name], MD  memantine (NAMENDA) 10 MG tablet Take 10 mg by mouth 2 (two) times  daily.    [provider]  solifenacin (VESICARE) 5 MG tablet Take 5 mg by mouth daily.    [provider]  traMADol (ULTRAM) 50 MG tablet Take 2 tablets (100 mg total) by mouth every 6 (six) hours as needed for moderate pain. 10/20/19   [name], MD  warfarin (COUMADIN) 7.5 MG tablet Take 0.5-1 tablets (3.75-7.5 mg total) by mouth See admin instructions. At d/c from hospital 11/2 - take 7.5 daily at 6p. Next lab draw 11/4. Adjust dose per coumadin clinic 09/14/19   Donzetta Starch, NP    Allergies    Penicillins  Review of Systems   Review of Systems  Constitutional: Negative for chills and fever.  HENT: Negative for ear pain and sore throat.   Eyes: Negative for  visual disturbance.  Respiratory: Negative for cough and shortness of breath.   Cardiovascular: Negative for chest pain.  Gastrointestinal: Positive for constipation. Negative for abdominal pain, diarrhea, nausea and vomiting.  Genitourinary: Negative for dysuria and hematuria.  Musculoskeletal: Positive for back pain.  Skin: Negative for rash.  Neurological: Negative for weakness, numbness and headaches.  All other systems reviewed and are negative.   Physical Exam Updated Vital Signs BP (!) 109/54 (BP Location: Left Arm)   Pulse 78   Temp 98 F (36.7 C) (Oral)   Resp 16   SpO2 95%   Physical Exam Vitals and nursing note reviewed.  Constitutional:      General: She is not in acute distress.    Appearance: She is well-developed.  HENT:     Head: Normocephalic and atraumatic.  Eyes:     Conjunctiva/sclera: Conjunctivae normal.  Cardiovascular:     Rate and Rhythm: Normal rate and regular rhythm.     Heart sounds: Normal heart sounds. No murmur.  Pulmonary:     Effort: Pulmonary effort is normal. No respiratory distress.     Breath sounds: Normal breath sounds. No wheezing, rhonchi or rales.  Abdominal:     General: Bowel sounds are normal.     Palpations: Abdomen is soft.     Tenderness: There is no abdominal tenderness. There is no guarding or rebound.  Musculoskeletal:     Cervical back: Neck supple.     Comments: TTP to the lumbar spine.  Skin:    General: Skin is warm and dry.  Neurological:     Mental Status: She is alert.     Comments: Motor:  Normal tone. 5/5 strength of BUE and BLE major muscle groups including strong and equal grip strength and dorsiflexion/plantar flexion Sensory: light touch normal in all extremities. CV: 2+ DP pulses      ED Results / Procedures / Treatments   Labs (all labs ordered are listed, but only abnormal results are displayed) Labs Reviewed  COMPREHENSIVE METABOLIC PANEL - Abnormal; Notable for the following components:       Result Value   Glucose, Bld 103 (*)    BUN 27 (*)    GFR calc non Af Amer 59 (*)    All other components within normal limits  PROTIME-INR - Abnormal; Notable for the following components:   Prothrombin Time 47.5 (*)    INR 5.1 (*)    All other components within normal limits  SARS CORONAVIRUS 2 (TAT 6-24 HRS)  CBC WITH DIFFERENTIAL/PLATELET  URINALYSIS, ROUTINE W REFLEX MICROSCOPIC    EKG None  Radiology DG Lumbar Spine Complete  Result Date: 01/17/2020 CLINICAL DATA:  Low back pain for  the past 3 days. No recent fall. History of rheumatoid arthritis. EXAM: LUMBAR SPINE - COMPLETE 4+ VIEW COMPARISON:  None. FINDINGS: There are 5 non rib-bearing lumbar type vertebral bodies. Mild scoliotic curvature of the thoracolumbar spine with dominant caudal component convex the left measuring approximately 12 degrees (as measured from the superior endplate of 624THL to the inferior endplate of L4). There is mild straightening expected lumbar lordosis. No anterolisthesis or retrolisthesis. No definite pars defects. Lumbar vertebral body heights appear preserved given obliquity. Mild to moderate multilevel lumbar spine DDD, worse at T12-L1 and L1-L2 with disc space height loss, endplate irregularity and sclerosis. Limited visualization of the bilateral SI joints is normal. Mild degenerative change of the bilateral hips is suspected though incompletely evaluated. Calcified lymph nodes or phleboliths overlie the right mid hemiabdomen. Additional phleboliths overlie the left hemipelvis. Atherosclerotic plaque within the abdominal aorta. IMPRESSION: 1. No acute findings. 2. Mild-to-moderate multilevel lumbar spine DDD, worse at T12-L1 and L1-L2. 3. Calcified lymph nodes versus prominent phleboliths overlie the right mid hemi pelvis. Electronically Signed   By: Sandi Mariscal M.D.   On: 01/17/2020 13:49   CT Lumbar Spine Wo Contrast  Result Date: 01/17/2020 CLINICAL DATA:  Low back pain. Additional history provided:  Report of low back pain for 3 days, observed by staff to be "part on and part off of bed." EXAM: CT LUMBAR SPINE WITHOUT CONTRAST TECHNIQUE: Multidetector CT imaging of the lumbar spine was performed without intravenous contrast administration. Multiplanar CT image reconstructions were also generated. COMPARISON:  Radiographs of the lumbar spine 01/17/2020, CT abdomen/pelvis 03/29/2019 FINDINGS: Segmentation: 5 lumbar vertebrae. Alignment: Lumbar levocurvature. Straightening of the expected lumbar lordosis. L1-L2 grade 1 retrolisthesis. Trace L5-S1 anterolisthesis Vertebrae: A mild L3 superior endplate compression deformity is new from prior exam 03/29/2019, but otherwise age indeterminate. Also new from prior examination 03/29/2019, there is a mildly displaced fracture through a ventrolateral osteophyte along the right lateral aspect of the L2-L3 disc space (series 8, image 17). This may be acute. Multilevel degenerative endplate irregularity with degenerative endplate sclerosis. The bones are diffusely demineralized. Paraspinal and other soft tissues: Calcified periaortic lymph nodes. Aortoiliac atherosclerosis. No other abnormality identified within included portions of the abdomen. Disc levels: Multilevel disc space narrowing greatest at L1-L2 (moderate/advanced at this level). There are shallow multilevel disc bulges with associated osteophyte ridge, most notably at L1-L2. Multilevel facet arthrosis/ligamentum flavum hypertrophy, greatest within the lower lumbar spine. No more than mild narrowing of the central canal at any level. At L5-S1, disc bulge with endplate spurring and facet arthrosis/ligamentum flavum hypertrophy contribute to moderate left subarticular stenosis. Moderate/advanced left-sided neural foraminal narrowing is also present at L5-S1. Additional sites of neural foraminal narrowing most notably as follows. Disc bulge with endplate spurring and facet arthrosis/ligamentum flavum hypertrophy  contribute to moderate/severe right-sided neural foraminal narrowing at L1-L2, moderate right-sided neural foraminal narrowing at L2-L3, moderate bilateral neural foraminal narrowing at L3-L4, and moderate left-sided neural foraminal narrowing at L4-L5. IMPRESSION: A mild L3 superior endplate compression fracture is new from prior exam 03/29/2019, but otherwise age indeterminate. This may be acute. Also new from this prior exam, there is a mildly displaced fracture through a ventrolateral osteophyte along the right lateral aspect of the L2-L3 disc space which may be acute. Lumbar spondylosis as described. No more than mild central canal narrowing at any level. Multifactorial moderate left subarticular narrowing at L5-S1. Multilevel neural foraminal narrowing as detailed. Lumbar levocurvature. Electronically Signed   By: Kellie Simmering DO  On: 01/17/2020 14:53    Procedures Procedures (including critical care time)  Medications Ordered in ED Medications  HYDROcodone-acetaminophen (NORCO/VICODIN) 5-325 MG per tablet 1 tablet (1 tablet Oral Given 01/17/20 1335)  morphine 4 MG/ML injection 4 mg (4 mg Intravenous Given 01/17/20 1732)  sodium chloride 0.9 % bolus 500 mL (500 mLs Intravenous New Bag/Given 01/17/20 1728)    ED Course  I have reviewed the triage vital signs and the nursing notes.  Pertinent labs & imaging results that were available during my care of the patient were reviewed by me and considered in my medical decision making (see chart for details).    MDM Rules/Calculators/A&P                      86 year old female presenting for evaluation of back pain.  She denies any falls or trauma  Reviewed labs. CBC is without leukocytosis or anemia CMP with mildly elevated BUN, normal creatinine, normal liver function and normal electrolytes. PT/INR is elevated at 5.1 UA pending on admission.  CT lumbar spine with a mild L3 superior endplate compression fracture is new from prior exam  03/29/2019, but otherwise age indeterminate. This may be acute. Also new from this prior exam, there is a mildly displaced fracture through a ventrolateral osteophyte along the right lateral aspect of the L2-L3 disc space which may be acute. Lumbar spondylosis as described. No more than mild central canal narrowing at any level. Multifactorial moderate left subarticular narrowing at L5-S1. Multilevel neural foraminal narrowing as detailed. Lumbar levocurvature.   4:17 PM CONSULT With Maudie Mercury from neurosurgery who reviewed the imaging. She recommends placing the patient in a TLSO brace. She states that patient can follow up in the office.  Pt was given a dose of pain meds and we attempted to ambulate her however she was unable to ambulate due to the pain.   Pt with multiple new fractures of the lumbar spine with inability to ambulate due to pain. Will admit for intractable pain.  5:36 PM CONSULT with Dr. Teryl Lucy with hospitalist service who accepts patient for admission.   Final Clinical Impression(s) / ED Diagnoses Final diagnoses:  Compression fracture of L3 lumbar vertebra, closed, initial encounter ([place])  Intractable pain  Supratherapeutic INR    Rx / DC Orders ED Discharge Orders    None       Bishop Dublin 01/17/20 1740    Blanchie Dessert, MD 01/18/20 2307

## 2020-01-17 NOTE — H&P (Signed)
History and Physical    Virginia Franklin [id number] DOB: 1928/12/15 DOA: 01/17/2020  PCP: [name], MD Patient coming from: [facility]  Chief Complaint: Back pain  HPI: Virginia Franklin is a 86 y.o. female with medical history significant of hypertension, diverticulitis, AVR with mechanical valve on Coumadin, hemorrhagic stroke. Patient presented secondary to one week of back pain that acutely worsened today to the point of being unable to ambulate/transfer without significant pain. She did not take any medication for the pain and was brought to the ED for evaluation. She reports no falls/injuries.  ED Course: Vitals: Afebrile, pulse of 71, respirations of 18,  Labs: INR of 5.1 Imaging: CT lumbar spine significant for mild L3 compression fracture and mildly displaced fracture through a ventrolateral osteophyte along the right lateral aspect of the L2-L3 disc space Medications/Course: morphine IV, Norco, 500 mL NS bolus  Review of Systems: Review of Systems  Constitutional: Negative for chills and fever.  Respiratory: Negative for shortness of breath.   Cardiovascular: Negative for chest pain.  Gastrointestinal: Positive for constipation. Negative for nausea and vomiting.  All other systems reviewed and are negative.   Past Medical History:  Diagnosis Date  . Body mass index (BMI) of 22.0-22.9 in adult   . Cervical cancer ([place])   . H/O cervical fracture   . H/O osteoporosis   . H/O rheumatoid arthritis   . H/O: CVA (cerebrovascular accident) 2012  . H/O: osteoarthritis   . History of urinary frequency   . Hx of diverticulitis of colon   . Hypertension     Past Surgical History:  Procedure Laterality Date  . ABDOMINAL HYSTERECTOMY    . CARDIAC SURGERY    . CATARACT EXTRACTION    . SMALL INTESTINE SURGERY       reports that she has quit smoking. She has never used smokeless tobacco. She reports current alcohol use. She reports that she does not use  drugs.  Allergies  Allergen Reactions  . Penicillins Anaphylaxis and Swelling    Has patient had a PCN reaction causing immediate rash, facial/tongue/throat swelling, SOB or lightheadedness with hypotension: Yes Has patient had a PCN reaction causing severe rash involving mucus membranes or skin necrosis: No Has patient had a PCN reaction that required hospitalization: No Has patient had a PCN reaction occurring within the last 10 years: No If all of the above answers are "NO", then may proceed with Cephalosporin use.    Family History  Problem Relation Age of Onset  . Kidney disease Mother        MALIGNANT NEOPLASM  . Arthritis Father   . Heart Problems Father   . Lung disease Brother   . Heart disease Neg Hx     Prior to Admission medications   Medication Sig Start Date End Date Taking? Authorizing Provider  escitalopram (LEXAPRO) 5 MG tablet TAKE 1 TABLET DAILY 01/04/20   [name], MD  ezetimibe-simvastatin (VYTORIN) 10-40 MG tablet Take 1 tablet by mouth daily.    [provider]  furosemide (LASIX) 20 MG tablet TAKE 1 TABLET DAILY 04/08/19   [name], MD  lisinopril (ZESTRIL) 10 MG tablet Take 1 tablet (10 mg total) by mouth daily. 10/02/19   [name], MD  memantine (NAMENDA) 10 MG tablet Take 10 mg by mouth 2 (two) times daily.    [provider]  solifenacin (VESICARE) 5 MG tablet Take 5 mg by mouth daily.    [provider]  traMADol (ULTRAM) 50 MG tablet Take 2 tablets (100 mg total) by mouth every 6 (six) hours as needed for moderate pain. 10/20/19   [name], MD  warfarin (COUMADIN) 7.5 MG tablet Take 0.5-1 tablets (3.75-7.5 mg total) by mouth See admin instructions. At d/c from hospital 11/2 - take 7.5 daily at 6p. Next lab draw 11/4. Adjust dose per coumadin clinic 09/14/19   Donzetta Starch, NP    Physical Exam:  Physical Exam Vitals and nursing note reviewed.  Constitutional:      General: She is not in acute  distress.    Appearance: She is well-developed. She is not diaphoretic.  HENT:     Mouth/Throat:     Mouth: Mucous membranes are moist.  Eyes:     Conjunctiva/sclera: Conjunctivae normal.     Pupils: Pupils are equal, round, and reactive to light.  Cardiovascular:     Rate and Rhythm: Normal rate and regular rhythm.     Heart sounds: Murmur (mechanical click heard) present.  Pulmonary:     Effort: Pulmonary effort is normal. No respiratory distress.     Breath sounds: Normal breath sounds. No wheezing or rales.  Abdominal:     General: There is no distension.     Comments: TLSO brace applied  Musculoskeletal:        General: No tenderness. Normal range of motion.     Cervical back: Normal range of motion.     Right lower leg: No edema.     Left lower leg: No edema.  Lymphadenopathy:     Cervical: No cervical adenopathy.  Skin:    General: Skin is warm and dry.  Neurological:     Mental Status: She is alert and oriented to person, place, and time.  Psychiatric:        Mood and Affect: Mood normal.        Thought Content: Thought content normal.     Labs on Admission: I have personally reviewed following labs and imaging studies  CBC: Recent Labs  Lab 01/17/20 1349  WBC 6.8  NEUTROABS 4.9  HGB 14.0  HCT 43.8  MCV 92.6  PLT [id number]    Basic Metabolic Panel: Recent Labs  Lab 01/17/20 1349  NA 137  K 3.6  CL 100  CO2 26  GLUCOSE 103*  BUN 27*  CREATININE 0.88  CALCIUM 9.3    GFR: CrCl cannot be calculated (Unknown ideal weight.).  Liver Function Tests: Recent Labs  Lab 01/17/20 1349  AST 17  ALT 15  ALKPHOS 111  BILITOT 0.8  PROT 7.8  ALBUMIN 4.1   No results for input(s): LIPASE, AMYLASE in the last 168 hours. No results for input(s): AMMONIA in the last 168 hours.  Coagulation Profile: Recent Labs  Lab 01/17/20 1339  INR 5.1*    Cardiac Enzymes: No results for input(s): CKTOTAL, CKMB, CKMBINDEX, TROPONINI in the last 168 hours.  BNP  (last 3 results) No results for input(s): PROBNP in the last 8760 hours.  HbA1C: No results for input(s): HGBA1C in the last 72 hours.  CBG: No results for input(s): GLUCAP in the last 168 hours.  Lipid Profile: No results for input(s): CHOL, HDL, LDLCALC, TRIG, CHOLHDL, LDLDIRECT in the last 72 hours.  Thyroid Function Tests: No results for input(s): TSH, T4TOTAL, FREET4, T3FREE, THYROIDAB in the last 72 hours.  Anemia Panel: No results for input(s): VITAMINB12, FOLATE, FERRITIN, TIBC, IRON, RETICCTPCT in the last 72 hours.  Urine analysis:  Component Value Date/Time   COLORURINE YELLOW 03/29/2019 [place] 03/29/2019 1555   LABSPEC 1.010 03/29/2019 1555   PHURINE 6.0 03/29/2019 [place] 03/29/2019 1555   HGBUR MODERATE (A) 03/29/2019 1555   BILIRUBINUR neg 10/02/2019 [place] 03/29/2019 1555   PROTEINUR Negative 10/02/2019 1557   PROTEINUR NEGATIVE 03/29/2019 1555   UROBILINOGEN 0.2 10/02/2019 1557   NITRITE neg 10/02/2019 1557   NITRITE NEGATIVE 03/29/2019 1555   LEUKOCYTESUR Negative 10/02/2019 [place] 03/29/2019 1555     Radiological Exams on Admission: DG Lumbar Spine Complete  Result Date: 01/17/2020 CLINICAL DATA:  Low back pain for the past 3 days. No recent fall. History of rheumatoid arthritis. EXAM: LUMBAR SPINE - COMPLETE 4+ VIEW COMPARISON:  None. FINDINGS: There are 5 non rib-bearing lumbar type vertebral bodies. Mild scoliotic curvature of the thoracolumbar spine with dominant caudal component convex the left measuring approximately 12 degrees (as measured from the superior endplate of 624THL to the inferior endplate of L4). There is mild straightening expected lumbar lordosis. No anterolisthesis or retrolisthesis. No definite pars defects. Lumbar vertebral body heights appear preserved given obliquity. Mild to moderate multilevel lumbar spine DDD, worse at T12-L1 and L1-L2 with disc space  height loss, endplate irregularity and sclerosis. Limited visualization of the bilateral SI joints is normal. Mild degenerative change of the bilateral hips is suspected though incompletely evaluated. Calcified lymph nodes or phleboliths overlie the right mid hemiabdomen. Additional phleboliths overlie the left hemipelvis. Atherosclerotic plaque within the abdominal aorta. IMPRESSION: 1. No acute findings. 2. Mild-to-moderate multilevel lumbar spine DDD, worse at T12-L1 and L1-L2. 3. Calcified lymph nodes versus prominent phleboliths overlie the right mid hemi pelvis. Electronically Signed   By: Sandi Mariscal M.D.   On: 01/17/2020 13:49   CT Lumbar Spine Wo Contrast  Result Date: 01/17/2020 CLINICAL DATA:  Low back pain. Additional history provided: Report of low back pain for 3 days, observed by staff to be "part on and part off of bed." EXAM: CT LUMBAR SPINE WITHOUT CONTRAST TECHNIQUE: Multidetector CT imaging of the lumbar spine was performed without intravenous contrast administration. Multiplanar CT image reconstructions were also generated. COMPARISON:  Radiographs of the lumbar spine 01/17/2020, CT abdomen/pelvis 03/29/2019 FINDINGS: Segmentation: 5 lumbar vertebrae. Alignment: Lumbar levocurvature. Straightening of the expected lumbar lordosis. L1-L2 grade 1 retrolisthesis. Trace L5-S1 anterolisthesis Vertebrae: A mild L3 superior endplate compression deformity is new from prior exam 03/29/2019, but otherwise age indeterminate. Also new from prior examination 03/29/2019, there is a mildly displaced fracture through a ventrolateral osteophyte along the right lateral aspect of the L2-L3 disc space (series 8, image 17). This may be acute. Multilevel degenerative endplate irregularity with degenerative endplate sclerosis. The bones are diffusely demineralized. Paraspinal and other soft tissues: Calcified periaortic lymph nodes. Aortoiliac atherosclerosis. No other abnormality identified within included portions  of the abdomen. Disc levels: Multilevel disc space narrowing greatest at L1-L2 (moderate/advanced at this level). There are shallow multilevel disc bulges with associated osteophyte ridge, most notably at L1-L2. Multilevel facet arthrosis/ligamentum flavum hypertrophy, greatest within the lower lumbar spine. No more than mild narrowing of the central canal at any level. At L5-S1, disc bulge with endplate spurring and facet arthrosis/ligamentum flavum hypertrophy contribute to moderate left subarticular stenosis. Moderate/advanced left-sided neural foraminal narrowing is also present at L5-S1. Additional sites of neural foraminal narrowing most notably as follows. Disc bulge with endplate spurring and facet arthrosis/ligamentum flavum hypertrophy contribute to moderate/severe right-sided neural  foraminal narrowing at L1-L2, moderate right-sided neural foraminal narrowing at L2-L3, moderate bilateral neural foraminal narrowing at L3-L4, and moderate left-sided neural foraminal narrowing at L4-L5. IMPRESSION: A mild L3 superior endplate compression fracture is new from prior exam 03/29/2019, but otherwise age indeterminate. This may be acute. Also new from this prior exam, there is a mildly displaced fracture through a ventrolateral osteophyte along the right lateral aspect of the L2-L3 disc space which may be acute. Lumbar spondylosis as described. No more than mild central canal narrowing at any level. Multifactorial moderate left subarticular narrowing at L5-S1. Multilevel neural foraminal narrowing as detailed. Lumbar levocurvature. Electronically Signed   By: Kellie Simmering DO   On: 01/17/2020 14:53    Assessment/Plan Principal Problem:   Compression fracture of L3 vertebra (HCC) Active Problems:   HTN (hypertension)   Depression with anxiety   Alzheimer's dementia (HCC)   Constipation   Compression fracture of L3 L2-L3 lateral disc space fracture No evidence of pathologic fracture. Neurosurgery  recommending TLSO brace and outpatient follow-up. Pain not controlled. Anticipate patient may need high level of care on discharge and may need SNF. -Tylenol, Tramadol, Norco prn -Morphine IV prn for breakthrough -PT/OT consult -Vitamin D level  Back pain Secondary to above. management above  History of hemorrhagic stroke -Continue Vytorin and Coumadin  History of mechanical aortic valve New INR goal of 2.5 to 3.  -Coumadin per pharmacy  Chronic anticoagulation Secondary to above. On coumadin. Currently supertherapeutic.  Essential hypertension -Continue lisinopril  Depression/anxiety -Continue   Dementia Patient on Namenda as an outpatient -Continue Namenda  Urinary -Continue Vesicare   DVT prophylaxis: Coumadin Code Status: DNR Family Communication: Daughter at bedside Disposition Plan: Discharge pending control of pain in addition to recommendations from PT/OT Consults called: Neurosurgery by EDP Admission status: Observation   Cordelia Poche, MD [facility] 01/17/2020, 5:34 PM

## 2020-01-18 ENCOUNTER — Encounter (HOSPITAL_COMMUNITY): Payer: Self-pay | Admitting: Family Medicine

## 2020-01-18 ENCOUNTER — Other Ambulatory Visit: Payer: Self-pay

## 2020-01-18 ENCOUNTER — Ambulatory Visit: Payer: Medicare HMO | Admitting: Family Medicine

## 2020-01-18 DIAGNOSIS — M069 Rheumatoid arthritis, unspecified: Secondary | ICD-10-CM | POA: Diagnosis present

## 2020-01-18 DIAGNOSIS — M810 Age-related osteoporosis without current pathological fracture: Secondary | ICD-10-CM | POA: Diagnosis present

## 2020-01-18 DIAGNOSIS — Z8261 Family history of arthritis: Secondary | ICD-10-CM | POA: Diagnosis not present

## 2020-01-18 DIAGNOSIS — M2578 Osteophyte, vertebrae: Secondary | ICD-10-CM | POA: Diagnosis present

## 2020-01-18 DIAGNOSIS — Z8249 Family history of ischemic heart disease and other diseases of the circulatory system: Secondary | ICD-10-CM | POA: Diagnosis not present

## 2020-01-18 DIAGNOSIS — Z8541 Personal history of malignant neoplasm of cervix uteri: Secondary | ICD-10-CM | POA: Diagnosis not present

## 2020-01-18 DIAGNOSIS — I10 Essential (primary) hypertension: Secondary | ICD-10-CM | POA: Diagnosis present

## 2020-01-18 DIAGNOSIS — G300 Alzheimer's disease with early onset: Secondary | ICD-10-CM | POA: Diagnosis not present

## 2020-01-18 DIAGNOSIS — Z20822 Contact with and (suspected) exposure to covid-19: Secondary | ICD-10-CM | POA: Diagnosis present

## 2020-01-18 DIAGNOSIS — M4856XA Collapsed vertebra, not elsewhere classified, lumbar region, initial encounter for fracture: Secondary | ICD-10-CM | POA: Diagnosis present

## 2020-01-18 DIAGNOSIS — F418 Other specified anxiety disorders: Secondary | ICD-10-CM | POA: Diagnosis present

## 2020-01-18 DIAGNOSIS — F0280 Dementia in other diseases classified elsewhere without behavioral disturbance: Secondary | ICD-10-CM | POA: Diagnosis present

## 2020-01-18 DIAGNOSIS — Z7901 Long term (current) use of anticoagulants: Secondary | ICD-10-CM | POA: Diagnosis not present

## 2020-01-18 DIAGNOSIS — K5900 Constipation, unspecified: Secondary | ICD-10-CM | POA: Diagnosis present

## 2020-01-18 DIAGNOSIS — E559 Vitamin D deficiency, unspecified: Secondary | ICD-10-CM | POA: Diagnosis present

## 2020-01-18 DIAGNOSIS — Z87891 Personal history of nicotine dependence: Secondary | ICD-10-CM | POA: Diagnosis not present

## 2020-01-18 DIAGNOSIS — S32030A Wedge compression fracture of third lumbar vertebra, initial encounter for closed fracture: Secondary | ICD-10-CM | POA: Diagnosis not present

## 2020-01-18 DIAGNOSIS — X58XXXA Exposure to other specified factors, initial encounter: Secondary | ICD-10-CM | POA: Diagnosis present

## 2020-01-18 DIAGNOSIS — Z8051 Family history of malignant neoplasm of kidney: Secondary | ICD-10-CM | POA: Diagnosis not present

## 2020-01-18 DIAGNOSIS — Z79899 Other long term (current) drug therapy: Secondary | ICD-10-CM | POA: Diagnosis not present

## 2020-01-18 DIAGNOSIS — Z952 Presence of prosthetic heart valve: Secondary | ICD-10-CM | POA: Diagnosis not present

## 2020-01-18 DIAGNOSIS — Z880 Allergy status to penicillin: Secondary | ICD-10-CM | POA: Diagnosis not present

## 2020-01-18 DIAGNOSIS — Z66 Do not resuscitate: Secondary | ICD-10-CM | POA: Diagnosis present

## 2020-01-18 DIAGNOSIS — Z8673 Personal history of transient ischemic attack (TIA), and cerebral infarction without residual deficits: Secondary | ICD-10-CM | POA: Diagnosis not present

## 2020-01-18 DIAGNOSIS — Y92009 Unspecified place in unspecified non-institutional (private) residence as the place of occurrence of the external cause: Secondary | ICD-10-CM | POA: Diagnosis not present

## 2020-01-18 DIAGNOSIS — G309 Alzheimer's disease, unspecified: Secondary | ICD-10-CM | POA: Diagnosis present

## 2020-01-18 LAB — BASIC METABOLIC PANEL
Anion gap: 8 (ref 5–15)
BUN: 21 mg/dL (ref 8–23)
CO2: 24 mmol/L (ref 22–32)
Calcium: 8.8 mg/dL — ABNORMAL LOW (ref 8.9–10.3)
Chloride: 105 mmol/L (ref 98–111)
Creatinine, Ser: 0.71 mg/dL (ref 0.44–1.00)
GFR calc Af Amer: >60 mL/min (ref >60)
GFR calc non Af Amer: >60 mL/min (ref >60)
Glucose, Bld: 112 mg/dL — ABNORMAL HIGH (ref 70–99)
Potassium: 3.6 mmol/L (ref 3.5–5.1)
Sodium: 137 mmol/L (ref 135–145)

## 2020-01-18 LAB — SARS CORONAVIRUS 2 (TAT 6-24 HRS): SARS Coronavirus 2: NEGATIVE

## 2020-01-18 LAB — CBC
HCT: 40.3 % (ref 36.0–46.0)
Hemoglobin: 12.8 g/dL (ref 12.0–15.0)
MCH: 30 pg (ref 26.0–34.0)
MCHC: 31.8 g/dL (ref 30.0–36.0)
MCV: 94.4 fL (ref 80.0–100.0)
Platelets: 235 10*3/uL (ref 150–400)
RBC: 4.27 MIL/uL (ref 3.87–5.11)
RDW: 12.6 % (ref 11.5–15.5)
WBC: 7.2 10*3/uL (ref 4.0–10.5)
nRBC: 0 % (ref 0.0–0.2)

## 2020-01-18 LAB — VITAMIN D 25 HYDROXY (VIT D DEFICIENCY, FRACTURES): Vit D, 25-Hydroxy: 21.15 ng/mL — ABNORMAL LOW (ref 30–100)

## 2020-01-18 LAB — PROTIME-INR
INR: 5 (ref 0.8–1.2)
Prothrombin Time: 46.6 seconds — ABNORMAL HIGH (ref 11.4–15.2)

## 2020-01-18 MED ORDER — SODIUM CHLORIDE 0.9 % IV BOLUS
500.0000 mL | Freq: Once | INTRAVENOUS | Status: AC
  Administered 2020-01-18: 500 mL via INTRAVENOUS

## 2020-01-18 MED ORDER — BOOST / RESOURCE BREEZE PO LIQD CUSTOM
1.0000 | Freq: Two times a day (BID) | ORAL | Status: DC
  Administered 2020-01-19 – 2020-01-22 (×5): 1 via ORAL

## 2020-01-18 MED ORDER — SODIUM CHLORIDE 0.9 % IV SOLN: Freq: Once | INTRAVENOUS | Status: AC

## 2020-01-18 MED ORDER — ADULT MULTIVITAMIN W/MINERALS CH
1.0000 | ORAL_TABLET | Freq: Every day | ORAL | Status: DC
  Administered 2020-01-19 – 2020-01-22 (×4): 1 via ORAL
  Filled 2020-01-18 (×4): qty 1

## 2020-01-18 MED ORDER — SODIUM CHLORIDE 0.9 % IV SOLN: INTRAVENOUS | Status: DC

## 2020-01-18 MED ORDER — ENSURE ENLIVE PO LIQD
237.0000 mL | Freq: Two times a day (BID) | ORAL | Status: DC
  Administered 2020-01-18 – 2020-01-21 (×6): 237 mL via ORAL

## 2020-01-18 NOTE — Progress Notes (Signed)
PROGRESS NOTE    Virginia Franklin  [id number] DOB: Jun 17, 1929 DOA: 01/17/2020 PCP: [name], MD   Brief Narrative: Virginia Franklin is a 86 y.o. female with medical history significant of hypertension, diverticulitis, AVR with mechanical valve on Coumadin, hemorrhagic stroke. Patient presented secondary to worsening back pain and found to have an L3 compression fracture. Pain management.   Assessment & Plan:   Principal Problem:   Compression fracture of L3 vertebra (HCC) Active Problems:   HTN (hypertension)   Depression with anxiety   Alzheimer's dementia (HCC)   Constipation   Compression fracture of L3 L2-L3 lateral disc space fracture No evidence of pathologic fracture. Neurosurgery recommending TLSO brace and outpatient follow-up. Pain not controlled. Anticipate patient may need high level of care on discharge and may need SNF. -Tylenol, Tramadol, Norco prn -Morphine IV prn for breakthrough -PT/OT consult: PT recommending SNF -Vitamin D level  Back pain Secondary to above. management above  History of hemorrhagic stroke -Continue Vytorin and Coumadin  History of mechanical aortic valve New INR goal of 2.5 to 3.  -Coumadin per pharmacy  Chronic anticoagulation Secondary to above. On coumadin. Currently supertherapeutic.  Essential hypertension -Continue lisinopril  Depression/anxiety -Continue   Dementia Patient on Namenda as an outpatient -Continue Namenda  Urinary -Continue Vesicare   DVT prophylaxis: Coumadin Code Status:   Code Status: DNR Family Communication: None at bedside Disposition Plan: Discharge possibly to SNF when bed available   Consultants:   Neurosurgery (Emergency Department)  Procedures:   None  Antimicrobials:  None    Subjective: No concerns today  Objective: Vitals:   01/17/20 2009 01/17/20 2249 01/18/20 0439 01/18/20 0640  BP: 107/60 (!) 149/53 (!) 167/65   Pulse: 78 64 84     Resp: 18 17 16    Temp: 99.3 F (37.4 C) 98.4 F (36.9 C) 98.9 F (37.2 C)   TempSrc: Oral Oral Axillary   SpO2: 92% 96% 95%   Weight:    59.1 kg  Height:    5\' 7"  (1.702 m)    Intake/Output Summary (Last 24 hours) at 01/18/2020 1303 Last data filed at 01/18/2020 0655 Gross per 24 hour  Intake 522.36 ml  Output --  Net 522.36 ml   Filed Weights   01/18/20 0640  Weight: 59.1 kg    Examination:  General exam: Appears calm and comfortable Respiratory system: Clear to auscultation. Respiratory effort normal. Cardiovascular system: S1 & S2 heard, Mechanical click. Gastrointestinal system: Abdomen is nondistended, soft and nontender. No organomegaly or masses felt. Normal bowel sounds heard. Central nervous system: Alert and oriented to self. No focal neurological deficits. Extremities: No edema. No calf tenderness Skin: No cyanosis. No rashes Psychiatry: Judgement and insight appear impaired. Pleasant mood.    Data Reviewed: I have personally reviewed following labs and imaging studies  CBC: Recent Labs  Lab 01/17/20 1349 01/18/20 0443  WBC 6.8 7.2  NEUTROABS 4.9  --   HGB 14.0 12.8  HCT 43.8 40.3  MCV 92.6 94.4  PLT 250 [id number]   Basic Metabolic Panel: Recent Labs  Lab 01/17/20 1349 01/18/20 0443  NA 137 137  K 3.6 3.6  CL 100 105  CO2 26 24  GLUCOSE 103* 112*  BUN 27* 21  CREATININE 0.88 0.71  CALCIUM 9.3 8.8*   GFR: Estimated Creatinine Clearance: 45.4 mL/min (by C-G formula based on SCr of 0.71 mg/dL). Liver Function Tests: Recent Labs  Lab 01/17/20 1349  AST 17  ALT 15  ALKPHOS 111  BILITOT 0.8  PROT 7.8  ALBUMIN 4.1   No results for input(s): LIPASE, AMYLASE in the last 168 hours. No results for input(s): AMMONIA in the last 168 hours. Coagulation Profile: Recent Labs  Lab 01/17/20 1339 01/18/20 0443  INR 5.1* 5.0*   Cardiac Enzymes: No results for input(s): CKTOTAL, CKMB, CKMBINDEX, TROPONINI in the last 168 hours. BNP (last 3  results) No results for input(s): PROBNP in the last 8760 hours. HbA1C: No results for input(s): HGBA1C in the last 72 hours. CBG: No results for input(s): GLUCAP in the last 168 hours. Lipid Profile: No results for input(s): CHOL, HDL, LDLCALC, TRIG, CHOLHDL, LDLDIRECT in the last 72 hours. Thyroid Function Tests: No results for input(s): TSH, T4TOTAL, FREET4, T3FREE, THYROIDAB in the last 72 hours. Anemia Panel: No results for input(s): VITAMINB12, FOLATE, FERRITIN, TIBC, IRON, RETICCTPCT in the last 72 hours. Sepsis Labs: No results for input(s): PROCALCITON, LATICACIDVEN in the last 168 hours.  Recent Results (from the past 240 hour(s))  SARS CORONAVIRUS 2 (TAT 6-24 HRS) Nasopharyngeal Nasopharyngeal Swab     Status: None   Collection Time: 01/17/20  6:04 PM   Specimen: Nasopharyngeal Swab  Result Value Ref Range Status   SARS Coronavirus 2 NEGATIVE NEGATIVE Final    Comment: (NOTE) SARS-CoV-2 target nucleic acids are NOT DETECTED. The SARS-CoV-2 RNA is generally detectable in upper and lower respiratory specimens during the acute phase of infection. Negative results do not preclude SARS-CoV-2 infection, do not rule out co-infections with other pathogens, and should not be used as the sole basis for treatment or other patient management decisions. Negative results must be combined with clinical observations, patient history, and epidemiological information. The expected result is Negative. Fact Sheet for Patients: SugarRoll.be Fact Sheet for Healthcare Providers: https://www.woods-mathews.com/ This test is not yet approved or cleared by the [place] FDA and  has been authorized for detection and/or diagnosis of SARS-CoV-2 by FDA under an Emergency Use Authorization (EUA). This EUA will remain  in effect (meaning this test can be used) for the duration of the COVID-19 declaration under Section 56 4(b)(1) of the Act, 21  U.S.C. section 360bbb-3(b)(1), unless the authorization is terminated or revoked sooner. Performed at [facility], [place] [address], [place], [place] [address]          Radiology Studies: DG Lumbar Spine Complete  Result Date: 01/17/2020 CLINICAL DATA:  Low back pain for the past 3 days. No recent fall. History of rheumatoid arthritis. EXAM: LUMBAR SPINE - COMPLETE 4+ VIEW COMPARISON:  None. FINDINGS: There are 5 non rib-bearing lumbar type vertebral bodies. Mild scoliotic curvature of the thoracolumbar spine with dominant caudal component convex the left measuring approximately 12 degrees (as measured from the superior endplate of 624THL to the inferior endplate of L4). There is mild straightening expected lumbar lordosis. No anterolisthesis or retrolisthesis. No definite pars defects. Lumbar vertebral body heights appear preserved given obliquity. Mild to moderate multilevel lumbar spine DDD, worse at T12-L1 and L1-L2 with disc space height loss, endplate irregularity and sclerosis. Limited visualization of the bilateral SI joints is normal. Mild degenerative change of the bilateral hips is suspected though incompletely evaluated. Calcified lymph nodes or phleboliths overlie the right mid hemiabdomen. Additional phleboliths overlie the left hemipelvis. Atherosclerotic plaque within the abdominal aorta. IMPRESSION: 1. No acute findings. 2. Mild-to-moderate multilevel lumbar spine DDD, worse at T12-L1 and L1-L2. 3. Calcified lymph nodes versus prominent phleboliths overlie the right mid hemi pelvis. Electronically Signed   By: Jenny Reichmann  Watts M.D.   On: 01/17/2020 13:49   CT Lumbar Spine Wo Contrast  Result Date: 01/17/2020 CLINICAL DATA:  Low back pain. Additional history provided: Report of low back pain for 3 days, observed by staff to be "part on and part off of bed." EXAM: CT LUMBAR SPINE WITHOUT CONTRAST TECHNIQUE: Multidetector CT imaging of the lumbar spine was performed without intravenous  contrast administration. Multiplanar CT image reconstructions were also generated. COMPARISON:  Radiographs of the lumbar spine 01/17/2020, CT abdomen/pelvis 03/29/2019 FINDINGS: Segmentation: 5 lumbar vertebrae. Alignment: Lumbar levocurvature. Straightening of the expected lumbar lordosis. L1-L2 grade 1 retrolisthesis. Trace L5-S1 anterolisthesis Vertebrae: A mild L3 superior endplate compression deformity is new from prior exam 03/29/2019, but otherwise age indeterminate. Also new from prior examination 03/29/2019, there is a mildly displaced fracture through a ventrolateral osteophyte along the right lateral aspect of the L2-L3 disc space (series 8, image 17). This may be acute. Multilevel degenerative endplate irregularity with degenerative endplate sclerosis. The bones are diffusely demineralized. Paraspinal and other soft tissues: Calcified periaortic lymph nodes. Aortoiliac atherosclerosis. No other abnormality identified within included portions of the abdomen. Disc levels: Multilevel disc space narrowing greatest at L1-L2 (moderate/advanced at this level). There are shallow multilevel disc bulges with associated osteophyte ridge, most notably at L1-L2. Multilevel facet arthrosis/ligamentum flavum hypertrophy, greatest within the lower lumbar spine. No more than mild narrowing of the central canal at any level. At L5-S1, disc bulge with endplate spurring and facet arthrosis/ligamentum flavum hypertrophy contribute to moderate left subarticular stenosis. Moderate/advanced left-sided neural foraminal narrowing is also present at L5-S1. Additional sites of neural foraminal narrowing most notably as follows. Disc bulge with endplate spurring and facet arthrosis/ligamentum flavum hypertrophy contribute to moderate/severe right-sided neural foraminal narrowing at L1-L2, moderate right-sided neural foraminal narrowing at L2-L3, moderate bilateral neural foraminal narrowing at L3-L4, and moderate left-sided neural  foraminal narrowing at L4-L5. IMPRESSION: A mild L3 superior endplate compression fracture is new from prior exam 03/29/2019, but otherwise age indeterminate. This may be acute. Also new from this prior exam, there is a mildly displaced fracture through a ventrolateral osteophyte along the right lateral aspect of the L2-L3 disc space which may be acute. Lumbar spondylosis as described. No more than mild central canal narrowing at any level. Multifactorial moderate left subarticular narrowing at L5-S1. Multilevel neural foraminal narrowing as detailed. Lumbar levocurvature. Electronically Signed   By: Kellie Simmering DO   On: 01/17/2020 14:53        Scheduled Meds: . darifenacin  7.5 mg Oral Daily  . docusate sodium  100 mg Oral BID  . escitalopram  5 mg Oral Daily  . ezetimibe-simvastatin  1 tablet Oral Daily  . furosemide  20 mg Oral Daily  . lisinopril  10 mg Oral Daily  . memantine  10 mg Oral BID  . Warfarin - Pharmacist Dosing Inpatient   Does not apply q1800   Continuous Infusions: . sodium chloride 10 mL/hr at 01/18/20 0200     LOS: 0 days     Cordelia Poche, MD [facility] 01/18/2020, 1:03 PM  If 7PM-7AM, please contact night-coverage [email or web address]

## 2020-01-18 NOTE — Progress Notes (Signed)
CRITICAL VALUE ALERT  Critical Value: INR: 5.0  Date & Time Notied:  Got the call from lab at 5:50 am on 01/18/20  Provider Notified: Lovey Newcomer, NP at 6:00 am  Orders Received/Actions taken: Waiting on a response.

## 2020-01-18 NOTE — Progress Notes (Signed)
ANTICOAGULATION CONSULT NOTE - Follow Up Consult  Pharmacy Consult for warfarin Indication: mechanical AVR  Allergies  Allergen Reactions  . Penicillins Anaphylaxis and Swelling    Has patient had a PCN reaction causing immediate rash, facial/tongue/throat swelling, SOB or lightheadedness with hypotension: Yes Has patient had a PCN reaction causing severe rash involving mucus membranes or skin necrosis: No Has patient had a PCN reaction that required hospitalization: No Has patient had a PCN reaction occurring within the last 10 years: No If all of the above answers are "NO", then may proceed with Cephalosporin use.    Patient Measurements: Height: 5\' 7"  (170.2 cm) Weight: 130 lb 4.7 oz (59.1 kg) IBW/kg (Calculated) : 61.6 Heparin Dosing Weight:   Vital Signs: Temp: 98.9 F (37.2 C) (03/08 0439) Temp Source: Axillary (03/08 0439) BP: 167/65 (03/08 0439) Pulse Rate: 84 (03/08 0439)  Labs: Recent Labs    01/17/20 1339 01/17/20 1349 01/18/20 0443  HGB  --  14.0 12.8  HCT  --  43.8 40.3  PLT  --  250 235  LABPROT 47.5*  --  46.6*  INR 5.1*  --  5.0*  CREATININE  --  0.88 0.71    Estimated Creatinine Clearance: 45.4 mL/min (by C-G formula based on SCr of 0.71 mg/dL).   Medications:  - PTA warfarin regimen: 7.5 mg daily except 3.75 mg on TTSun  Assessment: Patient's an 86 y.o F with hx hemorrhagic stroke and mechanical AVR on warfarin PTA, presented to the ED on 3/7 with c/o back pain.  CT lumbar spine showed L3 compression fracture and L2-L3 lateral disc space fracture.  Neurosurgery recom. TLSO brace. Warfarin resumed on admission.  Today, 01/18/2020: - INR remains supra-therapeutic at 5 - hgb down 12.8, plts somewhat stable - Per pt's RN, no bleeding noted - no signif. drug-drug intxns  Goal of Therapy:  INR goal 2.5-3 (per [facility] clinic's note) Monitor platelets by anticoagulation protocol: Yes   Plan:  - continue to hold warfarin today due to supra-therapeutic  INR - monitor for s/s bleeding  Janaia Kozel P 01/18/2020,8:06 AM

## 2020-01-18 NOTE — Progress Notes (Signed)
Initial Nutrition Assessment  DOCUMENTATION CODES:   Not applicable  INTERVENTION:  - will order Boost Breeze BID, each supplement provides 250 kcal and 9 grams of protein. - will order Ensure Enlive BID, each supplement provides 350 kcal and 20 grams of protein. - will order daily multivitamin with minerals.    NUTRITION DIAGNOSIS:   Increased nutrient needs related to acute illness as evidenced by estimated needs.  GOAL:   Patient will meet greater than or equal to 90% of their needs  MONITOR:   PO intake, Supplement acceptance, Labs, Weight trends  REASON FOR ASSESSMENT:   Malnutrition Screening Tool  ASSESSMENT:   86 y.o. female with medical history significant for HTN, diverticulitis, AVR with mechanical valve on Coumadin, and hemorrhagic stroke. Patient presented to the ED due to worsening back pain and found to have an L3 compression fracture.  No intakes documented since admission. Patient reports that she ate 100% of breakfast this AM and that lunch recently ordered but has not arrived. She denies any chewing or swallowing issues. She does have arthritis which is affecting both of her hands. She lives alone and prepares her own meals/food and is able to self feed but does take a bit longer d/t arthritis.  She denies any abdominal pain/pressure or N/V now or PTA. She reports that she has lost 10 lb, but unable to determine time frame for weight loss. She confirms that appetite was not good PTA, but unable to obtain further information on this at this time.  Per chart review, weight today is 130 lb, weight on 09/04/19 was 133 lb, and weight on 07/31/19 was 140 lb. This indicates 10 lb weight loss (7% body weight) in the past 6 months; not significant for time frame.    Labs reviewed. Medications reviewed; 100 mg colace BID, 20 mg oral lasix.     NUTRITION - FOCUSED PHYSICAL EXAM:    Most Recent Value  Orbital Region  No depletion  Upper Arm Region  No depletion   Thoracic and Lumbar Region  Unable to assess  Buccal Region  No depletion  Temple Region  No depletion  Clavicle Bone Region  Mild depletion  Clavicle and Acromion Bone Region  Mild depletion  Scapular Bone Region  Unable to assess  Dorsal Hand  No depletion  Patellar Region  No depletion  Anterior Thigh Region  Unable to assess  Posterior Calf Region  No depletion  Edema (RD Assessment)  None  Hair  Reviewed  Eyes  Reviewed  Mouth  Reviewed  Skin  Reviewed  Nails  Reviewed       Diet Order:   Diet Order            Diet Heart Room service appropriate? Yes; Fluid consistency: Thin  Diet effective now              EDUCATION NEEDS:   No education needs have been identified at this time  Skin:  Skin Assessment: Reviewed RN Assessment  Last BM:  PTA/unknown  Height:   Ht Readings from Last 1 Encounters:  01/18/20 5\' 7"  (1.702 m)    Weight:   Wt Readings from Last 1 Encounters:  01/18/20 59.1 kg    Ideal Body Weight:  61.4 kg  BMI:  Body mass index is 20.41 kg/m.  Estimated Nutritional Needs:   Kcal:  1500-1700 kcal  Protein:  70-80 grams  Fluid:  >/= 1.8 L/day     Jarome Matin, MS, RD, LDN, CNSC Inpatient  Clinical Dietitian RD pager # available in [place]  After hours/weekend pager # available in [facility]

## 2020-01-18 NOTE — Evaluation (Signed)
Physical Therapy Evaluation Patient Details Name: Virginia Franklin MRN: [id number] DOB: 1929/01/27 Today's Date: 01/18/2020   History of Present Illness  Virginia Franklin is a 86 y.o. female with medical history significant of hypertension, diverticulitis, AVR with mechanical valve on Coumadin, hemorrhagic stroke. Patient presented 01/17/20 with increased  back pain to the point of being unable to ambulate/transfer without significant pain. .CT lumbar spine with a mild L3 superior endplate compression fracture is new from prior exam 03/29/2019, but otherwise age indeterminate. This may be acute. Also new from this prior exam, there is a mildly displaced fracture through a ventrolateral osteophyte along the right lateral aspect of the L2-L3 disc space which may be acute. Lumbar spondylosis as described. No more than mild central canal narrowing at any level. Multifactorial moderate left subarticular narrowing at L5-S1.  Clinical Impression  The patient indicates back pain. Assisted to reposition in bed and remove TLSO which was not secured and out of position. Noted INR 5, OK per RN to dangle as tolerated. Patient  Will need premedication, then will attempt sitting on bed edge with TLSO. Pt admitted with above diagnosis.  Pt currently with functional limitations due to the deficits listed below (see PT Problem List). Pt will benefit from skilled PT to increase their independence and safety with mobility to allow discharge to the venue listed below.       Follow Up Recommendations SNF;Supervision/Assistance - 24 hour    Equipment Recommendations  None recommended by PT    Recommendations for Other Services       Precautions / Restrictions Precautions Precautions: Fall;Back Precaution Comments: INR 5 Required Braces or Orthoses: Spinal Brace Spinal Brace: Thoracolumbosacral orthotic;Applied in supine position(no specific orders) Restrictions Weight Bearing Restrictions: Yes       Mobility  Bed Mobility Overal bed mobility: Needs Assistance Bed Mobility: Rolling Rolling: Max assist;+2 for physical assistance;+2 for safety/equipment         General bed mobility comments: assist to flex each leg to facilitate a roll,  Use of bed pad to roll, patient guarding and reports increased back pain. TLSO removed from under patient/  Transfers                 General transfer comment: TBA  Ambulation/Gait                Stairs            Wheelchair Mobility    Modified Rankin (Stroke Patients Only)       Balance                                             Pertinent Vitals/Pain Pain Assessment: Faces Faces Pain Scale: Hurts whole lot Pain Location: back Pain Descriptors / Indicators: Discomfort;Moaning;Grimacing;Guarding Pain Intervention(s): Monitored during session;Patient requesting pain meds-RN notified;Limited activity within patient's tolerance    Home Living Family/patient expects to be discharged to:: Assisted living                 Additional Comments: [name]    Prior Function           Comments: Staff help with IADLs. Pt performs ADLs. Uses Rollator as needed- from previous encounter, chart indicates was independnet.     Hand Dominance        Extremity/Trunk Assessment        Lower Extremity  Assessment Lower Extremity Assessment: RLE deficits/detail;LLE deficits/detail RLE Deficits / Details: assist to fl;ex leg in prep to roll LLE Deficits / Details: , same as right.       Communication   Communication: Receptive difficulties;Expressive difficulties  Cognition Arousal/Alertness: Awake/alert Behavior During Therapy: WFL for tasks assessed/performed Overall Cognitive Status: History of cognitive impairments - at baseline Area of Impairment: Orientation;Following commands                 Orientation Level: Time;Situation;Place     Following Commands: Follows  one step commands with increased time              General Comments General comments (skin integrity, edema, etc.): TBA    Exercises     Assessment/Plan    PT Assessment Patient needs continued PT services  PT Problem List Decreased strength;Decreased mobility;Decreased safety awareness;Decreased knowledge of precautions;Decreased activity tolerance;Decreased cognition;Decreased balance;Decreased knowledge of use of DME;Pain       PT Treatment Interventions DME instruction;Therapeutic activities;Gait training;Therapeutic exercise;Patient/family education;Functional mobility training    PT Goals (Current goals can be found in the Care Plan section)  Acute Rehab PT Goals PT Goal Formulation: Patient unable to participate in goal setting Time For Goal Achievement: 02/01/20 Potential to Achieve Goals: Fair    Frequency Min 2X/week   Barriers to discharge        Co-evaluation               AM-PAC PT "6 Clicks" Mobility  Outcome Measure Help needed turning from your back to your side while in a flat bed without using bedrails?: Total Help needed moving from lying on your back to sitting on the side of a flat bed without using bedrails?: Total Help needed moving to and from a bed to a chair (including a wheelchair)?: Total Help needed standing up from a chair using your arms (e.g., wheelchair or bedside chair)?: Total Help needed to walk in hospital room?: Total Help needed climbing 3-5 steps with a railing? : Total 6 Click Score: 6    End of Session Equipment Utilized During Treatment: Back brace Activity Tolerance: Patient limited by pain Patient left: in bed;with call bell/phone within reach;with bed alarm set Nurse Communication: Mobility status PT Visit Diagnosis: Unsteadiness on feet (R26.81);Difficulty in walking, not elsewhere classified (R26.2);Pain    Time: [id number] PT Time Calculation (min) (ACUTE ONLY): 23 min   Charges:   PT Evaluation $PT Eval  Low Complexity: 1 Low PT Treatments $Therapeutic Activity: 8-22 mins        Tresa Endo PT Acute Rehabilitation Services Pager [phone] Office [phone]   Claretha Cooper 01/18/2020, 9:00 AM

## 2020-01-18 NOTE — Progress Notes (Signed)
Just paged [facility] to inform about BP: 167/65 and concerns about there not being any urine output this AM. IV fluids have been running at [facility] and still no output. Bladder scan was just done. 217 ml of urine showed up on bladder scan.

## 2020-01-18 NOTE — Progress Notes (Signed)
Physical Therapy Treatment Patient Details Name: Virginia Franklin MRN: [id number] DOB: 10/25/1929 Today's Date: 01/18/2020    History of Present Illness Virginia Franklin is a 86 y.o. female with medical history significant of hypertension, diverticulitis, AVR with mechanical valve on Coumadin, hemorrhagic stroke. Patient presented 01/17/20 with increased  back pain to the point of being unable to ambulate/transfer without significant pain. .CT lumbar spine with a mild L3 superior endplate compression fracture is new from prior exam 03/29/2019, but otherwise age indeterminate. This may be acute. Also new from this prior exam, there is a mildly displaced fracture through a ventrolateral osteophyte along the right lateral aspect of the L2-L3 disc space which may be acute. Lumbar spondylosis as described. No more than mild central canal narrowing at any level. Multifactorial moderate left subarticular narrowing at L5-S1.    PT Comments    The patient was premedicated and tolerated mobilizing to [facility] then to recliner using RW and 2 assisting. Patient currently will need assistance for all ADL's and ambulation.   Follow Up Recommendations  SNF;Supervision/Assistance - 24 hour(vs back to ALF with increased support)     Equipment Recommendations  None recommended by PT    Recommendations for Other Services       Precautions / Restrictions Precautions Precautions: Fall;Back Precaution Comments: INR 5 Required Braces or Orthoses: Spinal Brace Spinal Brace: Thoracolumbosacral orthotic;Applied in sitting position(no orders dor position)    Mobility  Bed Mobility Overal bed mobility: Needs Assistance Bed Mobility: Rolling;Sidelying to Sit Rolling: Min assist Sidelying to sit: Min assist       General bed mobility comments: cues  to flex legs to roll. assist with trunk, TLSO applied in sitting  Transfers Overall transfer level: Needs assistance Equipment used: Rolling walker (2  wheeled) Transfers: Sit to/from [facility] Sit to Stand: Mod assist Stand pivot transfers: Mod assist       General transfer comment: assist to rise and pivot to [facility] then a few steps to recliner. noted shakinees when standing  Ambulation/Gait Ambulation/Gait assistance: Mod assist Gait Distance (Feet): 5 Feet Assistive device: Rolling walker (2 wheeled) Gait Pattern/deviations: Step-to pattern     General Gait Details: steady assist to rise and  take steps using RW, multimodal cues. for hand placement   Stairs             Wheelchair Mobility    Modified Rankin (Stroke Patients Only)       Balance Overall balance assessment: Needs assistance Sitting-balance support: Bilateral upper extremity supported;Feet supported Sitting balance-Leahy Scale: Fair     Standing balance support: During functional activity;Bilateral upper extremity supported Standing balance-Leahy Scale: Poor Standing balance comment: steady assist for balance                            Cognition Arousal/Alertness: Awake/alert Behavior During Therapy: WFL for tasks assessed/performed Overall Cognitive Status: History of cognitive impairments - at baseline                                 General Comments: patient did answer that she lib=ves in independent living and her daughter will hire someone to assist. This is much more information than patient previouslt provided.      Exercises      General Comments        Pertinent Vitals/Pain Pain Assessment: No/denies pain    Home Living  Prior Function            PT Goals (current goals can now be found in the care plan section) Progress towards PT goals: Progressing toward goals    Frequency    Min 2X/week      PT Plan Current plan remains appropriate    Co-evaluation PT/OT/SLP Co-Evaluation/Treatment: Yes Reason for Co-Treatment: For patient/therapist  safety PT goals addressed during session: Mobility/safety with mobility        AM-PAC PT "6 Clicks" Mobility   Outcome Measure  Help needed turning from your back to your side while in a flat bed without using bedrails?: A Lot Help needed moving from lying on your back to sitting on the side of a flat bed without using bedrails?: A Lot Help needed moving to and from a bed to a chair (including a wheelchair)?: A Lot Help needed standing up from a chair using your arms (e.g., wheelchair or bedside chair)?: A Lot Help needed to walk in hospital room?: A Lot Help needed climbing 3-5 steps with a railing? : Total 6 Click Score: 11    End of Session Equipment Utilized During Treatment: Back brace Activity Tolerance: Patient tolerated treatment well Patient left: in chair;with call bell/phone within reach;with chair alarm set;with nursing/sitter in room Nurse Communication: Mobility status PT Visit Diagnosis: Unsteadiness on feet (R26.81);Difficulty in walking, not elsewhere classified (R26.2);Pain     Time: [id number] PT Time Calculation (min) (ACUTE ONLY): 18 min  Charges:  $Therapeutic Activity: 8-22 mins                     Tresa Endo PT Acute Rehabilitation Services Pager [phone] Office [phone]    Claretha Cooper 01/18/2020, 1:34 PM

## 2020-01-18 NOTE — Evaluation (Signed)
Occupational Therapy Evaluation Patient Details Name: Virginia Franklin MRN: [id number] DOB: 1929-07-13 Today's Date: 01/18/2020    History of Present Illness Virginia Franklin is a 86 y.o. female with medical history significant of hypertension, diverticulitis, AVR with mechanical valve on Coumadin, hemorrhagic stroke. Patient presented 01/17/20 with increased  back pain to the point of being unable to ambulate/transfer without significant pain. .CT lumbar spine with a mild L3 superior endplate compression fracture is new from prior exam 03/29/2019, but otherwise age indeterminate. This may be acute. Also new from this prior exam, there is a mildly displaced fracture through a ventrolateral osteophyte along the right lateral aspect of the L2-L3 disc space which may be acute. Lumbar spondylosis as described. No more than mild central canal narrowing at any level. Multifactorial moderate left subarticular narrowing at L5-S1.   Clinical Impression   Pt admitted s/p pain . Pt currently with functional limitations due to the deficits listed below (see OT Problem List).  Pt will benefit from skilled OT to increase their safety and independence with ADL and functional mobility for ADL to facilitate discharge to venue listed below.   Pt lives at [name] in which she has some A. Pt will need increased A upon DC with ADL activity      Follow Up Recommendations  Home health OT;Supervision/Assistance - 24 hour(HH at ALF if 24/7 A an option)    Equipment Recommendations  3 in 1 bedside commode    Recommendations for Other Services       Precautions / Restrictions Precautions Precautions: Fall;Back Precaution Comments: INR 5 Required Braces or Orthoses: Spinal Brace Spinal Brace: Thoracolumbosacral orthotic;Applied in sitting position(no orders dor position)      Mobility Bed Mobility Overal bed mobility: Needs Assistance Bed Mobility: Rolling;Sidelying to Sit Rolling: Min assist Sidelying to  sit: Min assist       General bed mobility comments: cues  to flex legs to roll. assist with trunk, TLSO applied in sitting  Transfers Overall transfer level: Needs assistance Equipment used: Rolling walker (2 wheeled) Transfers: Sit to/from [facility] Sit to Stand: Mod assist Stand pivot transfers: Mod assist       General transfer comment: assist to rise and pivot to [facility] then a few steps to recliner. noted shakinees when standing    Balance Overall balance assessment: Needs assistance Sitting-balance support: Bilateral upper extremity supported;Feet supported Sitting balance-Leahy Scale: Fair     Standing balance support: During functional activity;Bilateral upper extremity supported Standing balance-Leahy Scale: Poor Standing balance comment: steady assist for balance                           ADL either performed or assessed with clinical judgement   ADL Overall ADL's : Needs assistance/impaired Eating/Feeding: Set up;Sitting   Grooming: Set up;Sitting           Upper Body Dressing : Maximal assistance;Sitting Upper Body Dressing Details (indicate cue type and reason): brace Lower Body Dressing: Maximal assistance;Sit to/from stand;Cueing for safety;Cueing for compensatory techniques;Cueing for sequencing   Toilet Transfer: Moderate assistance;+2 for physical assistance;Squat-pivot;Stand-pivot;Cueing for safety;Cueing for sequencing;BSC;RW   Toileting- Clothing Manipulation and Hygiene: Maximal assistance;Sit to/from stand;Cueing for safety;Cueing for compensatory techniques;Cueing for sequencing         General ADL Comments: pt will need increased A at ALF if that is DC plan.  Pt states her daughter will A her as needed     Vision Patient Visual Report:  No change from baseline       Perception     Praxis      Pertinent Vitals/Pain Pain Assessment: No/denies pain     Hand Dominance     Extremity/Trunk Assessment Upper  Extremity Assessment Upper Extremity Assessment: Generalized weakness           Communication Communication Communication: Receptive difficulties;Expressive difficulties   Cognition Arousal/Alertness: Awake/alert Behavior During Therapy: WFL for tasks assessed/performed Overall Cognitive Status: History of cognitive impairments - at baseline                                 General Comments: patient did answer that she lib=ves in independent living and her daughter will hire someone to assist. This is much more information than patient previouslt provided.   General Comments               Home Living Family/patient expects to be discharged to:: Assisted living                                 Additional Comments: [name]      Prior Functioning/Environment          Comments: Staff help with IADLs. Pt performs ADLs. Uses Rollator as needed- from previous encounter, chart indicates was independnet.        OT Problem List: Decreased strength;Impaired balance (sitting and/or standing);Decreased activity tolerance;Decreased knowledge of use of DME or AE;Decreased knowledge of precautions      OT Treatment/Interventions: Self-care/ADL training;Patient/family education;DME and/or AE instruction;Therapeutic activities    OT Goals(Current goals can be found in the care plan section) Acute Rehab OT Goals Patient Stated Goal: back to ILF OT Goal Formulation: With patient Time For Goal Achievement: 01/25/20 Potential to Achieve Goals: Good  OT Frequency: Min 2X/week   Barriers to D/C: Decreased caregiver support          Co-evaluation   Reason for Co-Treatment: For patient/therapist safety PT goals addressed during session: Mobility/safety with mobility        AM-PAC OT "6 Clicks" Daily Activity     Outcome Measure Help from another person eating meals?: A Little Help from another person taking care of personal grooming?: A  Little Help from another person toileting, which includes using toliet, bedpan, or urinal?: A Lot Help from another person bathing (including washing, rinsing, drying)?: A Lot Help from another person to put on and taking off regular upper body clothing?: A Lot Help from another person to put on and taking off regular lower body clothing?: A Lot 6 Click Score: 14   End of Session Nurse Communication: Mobility status  Activity Tolerance: Patient tolerated treatment well Patient left: in chair;with call bell/phone within reach  OT Visit Diagnosis: Unsteadiness on feet (R26.81);Repeated falls (R29.6);Muscle weakness (generalized) (M62.81);History of falling (Z91.81);Other abnormalities of gait and mobility (R26.89)                Time: [id number] OT Time Calculation (min): 13 min Charges:  OT General Charges $OT Visit: 1 Visit OT Evaluation $OT Eval Moderate Complexity: 1 Mod  Kari Baars, [place] [phone] Office- [phone], Edwena Felty D 01/18/2020, 1:38 PM

## 2020-01-19 LAB — PROTIME-INR
INR: 4 — ABNORMAL HIGH (ref 0.8–1.2)
Prothrombin Time: 39 seconds — ABNORMAL HIGH (ref 11.4–15.2)

## 2020-01-19 MED ORDER — VITAMIN D 25 MCG (1000 UNIT) PO TABS
1000.0000 [IU] | ORAL_TABLET | Freq: Every day | ORAL | Status: DC
  Administered 2020-01-19 – 2020-01-22 (×4): 1000 [IU] via ORAL
  Filled 2020-01-19 (×4): qty 1

## 2020-01-19 NOTE — Progress Notes (Signed)
Transition of Care (TOC) -30 day Note       Patient Details  Name: Virginia Franklin [id number] Date of Birth:1929-04-16   Transition of Care [facility]) CM/SW Contact  Name:Kaleab Frasier Phone Number:[phone] Date:01/19/2020 Time:1:21PM   MUST ID:    To Whom it May Concern:   Please be advised that the above patient will require a short-term nursing home stay, anticipated 30 days or less rehabilitation and strengthening. The plan is for return home.

## 2020-01-19 NOTE — TOC Initial Note (Addendum)
Transition of Care [facility]) - Initial/Assessment Note    Patient Details  Name: Virginia Franklin MRN: [id number] Date of Birth: 1929/01/29  Transition of Care [facility]) CM/SW Contact:    Lia Hopping, [place] Phone Number: 01/19/2020, 10:04 AM  Clinical Narrative:     Patient admitted after one week of back pain.              CSW met with the patient at bedside to discuss SNF options. Patient sitting up eating breakfast. Patient agreeable for CSW to discuss discharge planning with her daughter [name]. CSW reached out to daughter and explain PT recommendation for short rehab placement.  She would like to pursue SNF for the patient in hopes she will learn to walk again with only support of her walker. Patient was independent with ADL's. She reports the patient PCP arranged home health- physical therapy and speech therapy. The patient was scheduled to have a session, but was admitted before this could happen.   CSW explain SNF process and following up bed offers.  Daughter reports when the patient had a stroke, she stayed in her home for a few months while receiving home health therapy and care. She reports the is a plan, just in case they are not satisfied with SNF options.  Patient will need Psychiatrist.   PASRR level II under manuel review.   Expected Discharge Plan: Skilled Nursing Facility Barriers to Discharge: Insurance Authorization, Continued Medical Work up   Patient Goals and CMS Choice Patient states their goals for this hospitalization and ongoing recovery are:: " Per daughter, "walk again with support of walker" CMS Medicare.gov Compare Post Acute Care list provided to:: Patient Represenative (must comment) Choice offered to / list presented to : Adult Children  Expected Discharge Plan and Services Expected Discharge Plan: [place] In-house Referral: Clinical Social Work   Post Acute Care Choice: [place] Living arrangements for the  past 2 months: [place]                                      Prior Living Arrangements/Services Living arrangements for the past 2 months: [place] Lives with:: Self, Facility Resident Patient language and need for interpreter reviewed:: No Do you feel safe going back to the place where you live?: Yes      Need for Family Participation in Patient Care: Yes (Comment) Care giver support system in place?: Yes (comment) Current home services: DME, Home PT, Other (comment)(Speech Therapy) Criminal Activity/Legal Involvement Pertinent to Current Situation/Hospitalization: No - Comment as needed  Activities of Daily Living   ADL Screening (condition at time of admission) Patient's cognitive ability adequate to safely complete daily activities?: Yes Is the patient deaf or have difficulty hearing?: No Does the patient have difficulty seeing, even when wearing glasses/contacts?: No Does the patient have difficulty concentrating, remembering, or making decisions?: No Patient able to express need for assistance with ADLs?: Yes Does the patient have difficulty dressing or bathing?: Yes Independently performs ADLs?: No Communication: Independent Dressing (OT): Needs assistance Is this a change from baseline?: Change from baseline, expected to last >3 days Grooming: Independent Feeding: Independent Bathing: Needs assistance Is this a change from baseline?: Change from baseline, expected to last >3 days Toileting: Needs assistance Is this a change from baseline?: Change from baseline, expected to last >3days In/Out Bed: Needs assistance Is this a change  from baseline?: Change from baseline, expected to last >3 days Walks in Home: Needs assistance Is this a change from baseline?: Change from baseline, expected to last >3 days Does the patient have difficulty walking or climbing stairs?: Yes Weakness of Legs: None Weakness of Arms/Hands:  None  Permission Sought/Granted Permission sought to share information with : Facility Sport and exercise psychologist, Family Supports, Case Manager Permission granted to share information with : Yes, Verbal Permission Granted  Share Information with NAME: Craig,Rebecca  Permission granted to share info w AGENCY: SNF  Permission granted to share info w Relationship: Daughter  Permission granted to share info w Contact Information: [phone]  Emotional Assessment Appearance:: Appears stated age Attitude/Demeanor/Rapport: Engaged Affect (typically observed): Calm, Pleasant Orientation: : Oriented to Self, Oriented to Place Alcohol / Substance Use: Not Applicable    Admission diagnosis:  Supratherapeutic INR [R79.1] Intractable pain [R52] Compression fracture of L3 lumbar vertebra, closed, initial encounter ([place]) [S32.030A] Compression fracture of L3 vertebra ([place]) [S32.030A] Patient Active Problem List   Diagnosis Date Noted  . Compression fracture of L3 vertebra ([place]) 01/17/2020  . Acute spont intraparenchymal hemorrhage assoc w/ coagulopathy (HCC) L lateral thalamus and PLIC on warfarin 16/05/3709  . Diverticulitis 03/29/2019  . Abdominal pain 03/29/2019  . Supratherapeutic INR 03/29/2019  . Kidney mass 03/29/2019  . Hyponatremia 03/29/2019  . Nausea 03/29/2019  . Constipation 03/29/2019  . Alzheimer's dementia ([place]) 01/05/2019  . Depression with anxiety 07/25/2018  . Altered mental status 12/16/2017  . Spells of speech arrest   . HTN (hypertension) 10/15/2017  . Dyslipidemia 10/15/2017  . Hx of completed stroke 10/15/2017  . Osteoarthritis 10/15/2017  . Encounter for therapeutic drug monitoring 09/13/2017  . Chronic anticoagulation 09/05/2017  . Aortic valve replaced 09/05/2017   PCP:  [name], MD Pharmacy:   [id number] [place], [place]Phone: [phone]: [phone] [facility] [place]SW[address]ams[place]Phone: [phone]: [phone] [id number] [address] [address] [place] [address] [id number]: [phone]: [id number] [place]: [phone]: [phone]  Social Determinants of Health (SDOH) Interventions    Readmission Risk Interventions No flowsheet data found.

## 2020-01-19 NOTE — Progress Notes (Signed)
Occupational Therapy Treatment Patient Details Name: Virginia Franklin MRN: [id number] DOB: Nov 13, 1928 Today's Date: 01/19/2020    History of present illness Virginia Franklin is a 86 y.o. female with medical history significant of hypertension, diverticulitis, AVR with mechanical valve on Coumadin, hemorrhagic stroke. Patient presented 01/17/20 with increased  back pain to the point of being unable to ambulate/transfer without significant pain. .CT lumbar spine with a mild L3 superior endplate compression fracture is new from prior exam 03/29/2019, but otherwise age indeterminate. This may be acute. Also new from this prior exam, there is a mildly displaced fracture through a ventrolateral osteophyte along the right lateral aspect of the L2-L3 disc space which may be acute. Lumbar spondylosis as described. No more than mild central canal narrowing at any level. Multifactorial moderate left subarticular narrowing at L5-S1.   OT comments  Pt may need SNF if ALF cant provide adequate A  Follow Up Recommendations  Home health OT;Supervision/Assistance - 24 hour(HH at ALF if 24/7 A an option)    Equipment Recommendations  3 in 1 bedside commode    Recommendations for Other Services      Precautions / Restrictions Precautions Precautions: Fall;Back Required Braces or Orthoses: Spinal Brace Spinal Brace: Thoracolumbosacral orthotic;Applied in sitting position(no orders dor position) Restrictions Weight Bearing Restrictions: Yes       Mobility Bed Mobility               General bed mobility comments: sitting EOB with TLSO  Transfers   Equipment used: Rolling walker (2 wheeled) Transfers: Sit to/from [facility] Sit to Stand: Mod assist Stand pivot transfers: Min assist;Mod assist            Balance Overall balance assessment: Needs assistance Sitting-balance support: Bilateral upper extremity supported;Feet supported Sitting balance-Leahy Scale:  Fair     Standing balance support: During functional activity;Bilateral upper extremity supported Standing balance-Leahy Scale: Poor Standing balance comment: steady assist for balance                           ADL either performed or assessed with clinical judgement   ADL Overall ADL's : Needs assistance/impaired         Upper Body Bathing: Minimal assistance;Sitting   Lower Body Bathing: Maximal assistance;Sit to/from stand;Cueing for compensatory techniques;Cueing for safety;Cueing for sequencing   Upper Body Dressing : Maximal assistance;Sitting Upper Body Dressing Details (indicate cue type and reason): brace Lower Body Dressing: Sit to/from stand;Cueing for safety;Cueing for compensatory techniques;Cueing for sequencing   Toilet Transfer: Squat-pivot;Stand-pivot;Cueing for safety;Cueing for sequencing;BSC;RW;Minimal assistance   Toileting- Clothing Manipulation and Hygiene: Moderate assistance;Sit to/from stand;Cueing for safety;Cueing for sequencing       Functional mobility during ADLs: Minimal assistance;Rolling walker;Moderate assistance       Vision Patient Visual Report: No change from baseline            Cognition Arousal/Alertness: Awake/alert Behavior During Therapy: WFL for tasks assessed/performed Overall Cognitive Status: History of cognitive impairments - at baseline                                 General Comments: patient did answer that she lib=ves in independent living and her daughter will hire someone to assist. This is much more information than patient previouslt provided.                   Pertinent  Vitals/ Pain       Pain Score: 4  Faces Pain Scale: Hurts little more Pain Descriptors / Indicators: Discomfort;Moaning;Grimacing;Guarding Pain Intervention(s): Limited activity within patient's tolerance;Monitored during session         Frequency  Min 2X/week        Progress Toward Goals  OT  Goals(current goals can now be found in the care plan section)  Progress towards OT goals: Progressing toward goals     Plan Discharge plan remains appropriate    Co-evaluation                 AM-PAC OT "6 Clicks" Daily Activity     Outcome Measure   Help from another person eating meals?: A Little Help from another person taking care of personal grooming?: A Little Help from another person toileting, which includes using toliet, bedpan, or urinal?: A Lot Help from another person bathing (including washing, rinsing, drying)?: A Lot Help from another person to put on and taking off regular upper body clothing?: A Lot Help from another person to put on and taking off regular lower body clothing?: A Lot 6 Click Score: 14    End of Session    OT Visit Diagnosis: Unsteadiness on feet (R26.81);Repeated falls (R29.6);Muscle weakness (generalized) (M62.81);History of falling (Z91.81);Other abnormalities of gait and mobility (R26.89)   Activity Tolerance Patient tolerated treatment well   Patient Left in chair;with call bell/phone within reach   Nurse Communication Mobility status        Time: 1045-1110 OT Time Calculation (min): 25 min  Charges: OT General Charges $OT Visit: 1 Visit OT Treatments $Self Care/Home Management : 23-37 mins  Kari Baars, [place] [phone] Office- [phone]      Lossie Kalp, Edwena Felty D 01/19/2020, 11:50 AM

## 2020-01-19 NOTE — NC FL2 (Signed)
[place] LEVEL OF CARE SCREENING TOOL     IDENTIFICATION  Patient Name: Virginia Franklin Birthdate: May 03, 1929 Sex: female Admission Date (Current Location): 01/17/2020  [facility] and [place] Number:  Herbalist and Address:  [facility],  [address], [place]      Provider Number: [id number]  Attending Physician Name and Address:  Mariel Aloe, MD  Relative Name and Phone Number:  [name] Daughter [phone]    Current Level of Care: Hospital Recommended Level of Care: [place] Prior Approval Number:    Date Approved/Denied:   PASRR Number:    Discharge Plan: SNF    Current Diagnoses: Patient Active Problem List   Diagnosis Date Noted  . Compression fracture of L3 vertebra ([place]) 01/17/2020  . Acute spont intraparenchymal hemorrhage assoc w/ coagulopathy (HCC) L lateral thalamus and PLIC on warfarin [id number]  . Diverticulitis 03/29/2019  . Abdominal pain 03/29/2019  . Supratherapeutic INR 03/29/2019  . Kidney mass 03/29/2019  . Hyponatremia 03/29/2019  . Nausea 03/29/2019  . Constipation 03/29/2019  . Alzheimer's dementia ([place]) 01/05/2019  . Depression with anxiety 07/25/2018  . Altered mental status 12/16/2017  . Spells of speech arrest   . HTN (hypertension) 10/15/2017  . Dyslipidemia 10/15/2017  . Hx of completed stroke 10/15/2017  . Osteoarthritis 10/15/2017  . Encounter for therapeutic drug monitoring 09/13/2017  . Chronic anticoagulation 09/05/2017  . Aortic valve replaced 09/05/2017    Orientation RESPIRATION BLADDER Height & Weight     Self, Time, Situation, Place  Normal Incontinent Weight: 130 lb 4.7 oz (59.1 kg) Height:  5\' 7"  (170.2 cm)  BEHAVIORAL SYMPTOMS/MOOD NEUROLOGICAL BOWEL NUTRITION STATUS      Continent Diet(Heart Healthy)  AMBULATORY STATUS COMMUNICATION OF NEEDS Skin   Extensive Assist Verbally Normal                       Personal Care  Assistance Level of Assistance  Bathing, Dressing, Feeding Bathing Assistance: Maximum assistance Feeding assistance: Independent Dressing Assistance: Maximum assistance     Functional Limitations Info  Sight, Hearing, Speech Sight Info: Adequate Hearing Info: Adequate Speech Info: Adequate    SPECIAL CARE FACTORS FREQUENCY  PT (By licensed PT), OT (By licensed OT)     PT Frequency: 5x/week OT Frequency: 5x/week            Contractures Contractures Info: Not present    Additional Factors Info  Code Status, Allergies, Psychotropic Code Status Info: DNR Allergies Info: Allergies: Penicillins Psychotropic Info: Lexapro, Namenda         Current Medications (01/19/2020):  This is the current hospital active medication list Current Facility-Administered Medications  Medication Dose Route Frequency Provider Last Rate Last Admin  . 0.9 %  sodium chloride infusion   Intravenous PRN Mariel Aloe, MD 10 mL/hr at 01/18/20 0200 Rate Verify at 01/18/20 0200  . acetaminophen (TYLENOL) tablet 650 mg  650 mg Oral Q6H PRN Mariel Aloe, MD       Or  . acetaminophen (TYLENOL) suppository 650 mg  650 mg Rectal Q6H PRN Mariel Aloe, MD      . cholecalciferol (VITAMIN D3) tablet 1,000 Units  1,000 Units Oral Daily Mariel Aloe, MD   1,000 Units at 01/19/20 1020  . darifenacin (ENABLEX) 24 hr tablet 7.5 mg  7.5 mg Oral Daily Mariel Aloe, MD   7.5 mg at 01/19/20 1015  . docusate sodium (COLACE) capsule 100  mg  100 mg Oral BID Mariel Aloe, MD   100 mg at 01/19/20 1015  . escitalopram (LEXAPRO) tablet 5 mg  5 mg Oral Daily Mariel Aloe, MD   5 mg at 01/19/20 1015  . ezetimibe-simvastatin (VYTORIN) 10-40 MG per tablet 1 tablet  1 tablet Oral Daily Mariel Aloe, MD   1 tablet at 01/19/20 1015  . feeding supplement (BOOST / RESOURCE BREEZE) liquid 1 Container  1 Container Oral BID BM Mariel Aloe, MD   1 Container at 01/19/20 1016  . feeding supplement (ENSURE ENLIVE)  (ENSURE ENLIVE) liquid 237 mL  237 mL Oral BID BM Mariel Aloe, MD   237 mL at 01/18/20 2104  . furosemide (LASIX) tablet 20 mg  20 mg Oral Daily Mariel Aloe, MD   20 mg at 01/19/20 1015  . HYDROcodone-acetaminophen (NORCO/VICODIN) 5-325 MG per tablet 1-2 tablet  1-2 tablet Oral Q6H PRN Mariel Aloe, MD      . lisinopril (ZESTRIL) tablet 10 mg  10 mg Oral Daily Mariel Aloe, MD   10 mg at 01/19/20 1015  . memantine (NAMENDA) tablet 10 mg  10 mg Oral BID Mariel Aloe, MD   10 mg at 01/19/20 1015  . morphine 2 MG/ML injection 2 mg  2 mg Intravenous Q4H PRN Mariel Aloe, MD      . multivitamin with minerals tablet 1 tablet  1 tablet Oral Daily Mariel Aloe, MD   1 tablet at 01/19/20 1016  . ondansetron (ZOFRAN) tablet 4 mg  4 mg Oral Q6H PRN Mariel Aloe, MD       Or  . ondansetron (ZOFRAN) injection 4 mg  4 mg Intravenous Q6H PRN Mariel Aloe, MD      . polyethylene glycol (MIRALAX / GLYCOLAX) packet 17 g  17 g Oral Daily PRN Mariel Aloe, MD      . traMADol Veatrice Bourbon) tablet 100 mg  100 mg Oral Q12H PRN Mariel Aloe, MD   100 mg at 01/18/20 2105  . Warfarin - Pharmacist Dosing Inpatient   Does not apply q1800 Royetta Asal, [facility]   Stopped at 01/18/20 1800     Discharge Medications: Please see discharge summary for a list of discharge medications.  Relevant Imaging Results:  Relevant Lab Results:   Additional Information SS#: [id number]  Lia Hopping, LCSW

## 2020-01-19 NOTE — Progress Notes (Signed)
ANTICOAGULATION CONSULT NOTE - Follow Up Consult  Pharmacy Consult for warfarin Indication: mechanical AVR  Allergies  Allergen Reactions  . Penicillins Anaphylaxis and Swelling    Has patient had a PCN reaction causing immediate rash, facial/tongue/throat swelling, SOB or lightheadedness with hypotension: Yes Has patient had a PCN reaction causing severe rash involving mucus membranes or skin necrosis: No Has patient had a PCN reaction that required hospitalization: No Has patient had a PCN reaction occurring within the last 10 years: No If all of the above answers are "NO", then may proceed with Cephalosporin use.    Patient Measurements: Height: 5\' 7"  (170.2 cm) Weight: 130 lb 4.7 oz (59.1 kg) IBW/kg (Calculated) : 61.6 Heparin Dosing Weight:   Vital Signs: Temp: 98.2 F (36.8 C) (03/09 0603) Temp Source: Oral (03/09 0603) BP: 137/61 (03/09 0603) Pulse Rate: 68 (03/09 0603)  Labs: Recent Labs    01/17/20 1339 01/17/20 1349 01/18/20 0443 01/19/20 0512  HGB  --  14.0 12.8  --   HCT  --  43.8 40.3  --   PLT  --  250 235  --   LABPROT 47.5*  --  46.6* 39.0*  INR 5.1*  --  5.0* 4.0*  CREATININE  --  0.88 0.71  --     Estimated Creatinine Clearance: 45.4 mL/min (by C-G formula based on SCr of 0.71 mg/dL).   Medications:  - PTA warfarin regimen: 7.5 mg daily except 3.75 mg on TTSun  Assessment: Patient's an 86 y.o F with hx hemorrhagic stroke and mechanical AVR on warfarin PTA, presented to the ED on 3/7 with c/o back pain.  CT lumbar spine showed L3 compression fracture and L2-L3 lateral disc space fracture.  Neurosurgery recom. TLSO brace. Warfarin resumed on admission.  Today, 01/19/2020: - INR remains supra-therapeutic at 4 - hgb down 12.8, plts somewhat stable - Per pt's RN, no bleeding noted - no signif. drug-drug intxns  Goal of Therapy:  INR goal 2.5-3 (per [facility] clinic's note) Monitor platelets by anticoagulation protocol: Yes   Plan:  - continue to hold  warfarin today due to supra-therapeutic INR - monitor for s/s bleeding  Ulice Dash D 01/19/2020,9:20 AM

## 2020-01-19 NOTE — Progress Notes (Signed)
PROGRESS NOTE    Virginia Franklin  [id number] DOB: 03-06-1929 DOA: 01/17/2020 PCP: [name], MD   Brief Narrative: Virginia Franklin is a 86 y.o. female with medical history significant of hypertension, diverticulitis, AVR with mechanical valve on Coumadin, hemorrhagic stroke. Patient presented secondary to worsening back pain and found to have an L3 compression fracture. Pain management.   Assessment & Plan:   Principal Problem:   Compression fracture of L3 vertebra (HCC) Active Problems:   HTN (hypertension)   Depression with anxiety   Alzheimer's dementia (HCC)   Constipation   Compression fracture of L3 L2-L3 lateral disc space fracture No evidence of pathologic fracture. Neurosurgery recommending TLSO brace and outpatient follow-up. Pain not controlled. Anticipate patient may need high level of care on discharge and may need SNF. Low vitamin D level. -Tylenol, Tramadol, Norco prn -Morphine IV prn for breakthrough -PT/OT consult: PT recommending SNF -TLSO brace with movement/ambulation; off when lying down.  Back pain Secondary to above. management above  Vitamin D deficiency -Vitamin D3 1000 units daily  History of hemorrhagic stroke -Continue Vytorin and Coumadin  History of mechanical aortic valve New INR goal of 2.5 to 3. INR trending down -Coumadin per pharmacy  Chronic anticoagulation Secondary to above. On coumadin. Currently supertherapeutic.  Essential hypertension -Continue lisinopril  Depression/anxiety -Continue Lexapro  Dementia Patient on Namenda as an outpatient -Continue Namenda  Urinary -Continue Vesicare   DVT prophylaxis: Coumadin Code Status:   Code Status: DNR Family Communication: Daughter on telephone Disposition Plan: Discharge to SNF bending bed availability. Medically stable   Consultants:   Neurosurgery (Emergency Department)  Procedures:   None  Antimicrobials:  None     Subjective: No pain when laying down.  Objective: Vitals:   01/18/20 0640 01/18/20 1359 01/18/20 2146 01/19/20 0603  BP:  (!) 141/60 (!) 128/46 137/61  Pulse:  63 80 68  Resp:  18 18 16   Temp:  (!) 97.5 F (36.4 C) 99 F (37.2 C) 98.2 F (36.8 C)  TempSrc:  Oral Oral Oral  SpO2:  96% 95% 93%  Weight: 59.1 kg     Height: 5\' 7"  (1.702 m)       Intake/Output Summary (Last 24 hours) at 01/19/2020 0959 Last data filed at 01/19/2020 0600 Gross per 24 hour  Intake 804.08 ml  Output 600 ml  Net 204.08 ml   Filed Weights   01/18/20 0640  Weight: 59.1 kg    Examination:  General exam: Appears calm and comfortable Respiratory system: Clear to auscultation. Respiratory effort normal. Cardiovascular system: S1 & S2 heard, RRR. Mechanical click. Gastrointestinal system: Abdomen blocked by TLSO brace Central nervous system: Alert and oriented to person and place. No focal neurological deficits. Extremities: No edema. No calf tenderness Skin: No cyanosis. No rashes Psychiatry: Judgement and insight appear normal. Mood & affect appropriate.     Data Reviewed: I have personally reviewed following labs and imaging studies  CBC: Recent Labs  Lab 01/17/20 1349 01/18/20 0443  WBC 6.8 7.2  NEUTROABS 4.9  --   HGB 14.0 12.8  HCT 43.8 40.3  MCV 92.6 94.4  PLT 250 [id number]   Basic Metabolic Panel: Recent Labs  Lab 01/17/20 1349 01/18/20 0443  NA 137 137  K 3.6 3.6  CL 100 105  CO2 26 24  GLUCOSE 103* 112*  BUN 27* 21  CREATININE 0.88 0.71  CALCIUM 9.3 8.8*   GFR: Estimated Creatinine Clearance: 45.4 mL/min (by C-G formula based on SCr of  0.71 mg/dL). Liver Function Tests: Recent Labs  Lab 01/17/20 1349  AST 17  ALT 15  ALKPHOS 111  BILITOT 0.8  PROT 7.8  ALBUMIN 4.1   No results for input(s): LIPASE, AMYLASE in the last 168 hours. No results for input(s): AMMONIA in the last 168 hours. Coagulation Profile: Recent Labs  Lab 01/17/20 1339 01/18/20 0443  01/19/20 0512  INR 5.1* 5.0* 4.0*   Cardiac Enzymes: No results for input(s): CKTOTAL, CKMB, CKMBINDEX, TROPONINI in the last 168 hours. BNP (last 3 results) No results for input(s): PROBNP in the last 8760 hours. HbA1C: No results for input(s): HGBA1C in the last 72 hours. CBG: No results for input(s): GLUCAP in the last 168 hours. Lipid Profile: No results for input(s): CHOL, HDL, LDLCALC, TRIG, CHOLHDL, LDLDIRECT in the last 72 hours. Thyroid Function Tests: No results for input(s): TSH, T4TOTAL, FREET4, T3FREE, THYROIDAB in the last 72 hours. Anemia Panel: No results for input(s): VITAMINB12, FOLATE, FERRITIN, TIBC, IRON, RETICCTPCT in the last 72 hours. Sepsis Labs: No results for input(s): PROCALCITON, LATICACIDVEN in the last 168 hours.  Recent Results (from the past 240 hour(s))  SARS CORONAVIRUS 2 (TAT 6-24 HRS) Nasopharyngeal Nasopharyngeal Swab     Status: None   Collection Time: 01/17/20  6:04 PM   Specimen: Nasopharyngeal Swab  Result Value Ref Range Status   SARS Coronavirus 2 NEGATIVE NEGATIVE Final    Comment: (NOTE) SARS-CoV-2 target nucleic acids are NOT DETECTED. The SARS-CoV-2 RNA is generally detectable in upper and lower respiratory specimens during the acute phase of infection. Negative results do not preclude SARS-CoV-2 infection, do not rule out co-infections with other pathogens, and should not be used as the sole basis for treatment or other patient management decisions. Negative results must be combined with clinical observations, patient history, and epidemiological information. The expected result is Negative. Fact Sheet for Patients: SugarRoll.be Fact Sheet for Healthcare Providers: https://www.woods-mathews.com/ This test is not yet approved or cleared by the [place] FDA and  has been authorized for detection and/or diagnosis of SARS-CoV-2 by FDA under an Emergency Use Authorization (EUA). This  EUA will remain  in effect (meaning this test can be used) for the duration of the COVID-19 declaration under Section 56 4(b)(1) of the Act, 21 U.S.C. section 360bbb-3(b)(1), unless the authorization is terminated or revoked sooner. Performed at [facility], [place] [address] [place], [place] [address]          Radiology Studies: DG Lumbar Spine Complete  Result Date: 01/17/2020 CLINICAL DATA:  Low back pain for the past 3 days. No recent fall. History of rheumatoid arthritis. EXAM: LUMBAR SPINE - COMPLETE 4+ VIEW COMPARISON:  None. FINDINGS: There are 5 non rib-bearing lumbar type vertebral bodies. Mild scoliotic curvature of the thoracolumbar spine with dominant caudal component convex the left measuring approximately 12 degrees (as measured from the superior endplate of 624THL to the inferior endplate of L4). There is mild straightening expected lumbar lordosis. No anterolisthesis or retrolisthesis. No definite pars defects. Lumbar vertebral body heights appear preserved given obliquity. Mild to moderate multilevel lumbar spine DDD, worse at T12-L1 and L1-L2 with disc space height loss, endplate irregularity and sclerosis. Limited visualization of the bilateral SI joints is normal. Mild degenerative change of the bilateral hips is suspected though incompletely evaluated. Calcified lymph nodes or phleboliths overlie the right mid hemiabdomen. Additional phleboliths overlie the left hemipelvis. Atherosclerotic plaque within the abdominal aorta. IMPRESSION: 1. No acute findings. 2. Mild-to-moderate multilevel lumbar spine DDD,  worse at T12-L1 and L1-L2. 3. Calcified lymph nodes versus prominent phleboliths overlie the right mid hemi pelvis. Electronically Signed   By: Sandi Mariscal M.D.   On: 01/17/2020 13:49   CT Lumbar Spine Wo Contrast  Result Date: 01/17/2020 CLINICAL DATA:  Low back pain. Additional history provided: Report of low back pain for 3 days, observed by staff to be "part on and  part off of bed." EXAM: CT LUMBAR SPINE WITHOUT CONTRAST TECHNIQUE: Multidetector CT imaging of the lumbar spine was performed without intravenous contrast administration. Multiplanar CT image reconstructions were also generated. COMPARISON:  Radiographs of the lumbar spine 01/17/2020, CT abdomen/pelvis 03/29/2019 FINDINGS: Segmentation: 5 lumbar vertebrae. Alignment: Lumbar levocurvature. Straightening of the expected lumbar lordosis. L1-L2 grade 1 retrolisthesis. Trace L5-S1 anterolisthesis Vertebrae: A mild L3 superior endplate compression deformity is new from prior exam 03/29/2019, but otherwise age indeterminate. Also new from prior examination 03/29/2019, there is a mildly displaced fracture through a ventrolateral osteophyte along the right lateral aspect of the L2-L3 disc space (series 8, image 17). This may be acute. Multilevel degenerative endplate irregularity with degenerative endplate sclerosis. The bones are diffusely demineralized. Paraspinal and other soft tissues: Calcified periaortic lymph nodes. Aortoiliac atherosclerosis. No other abnormality identified within included portions of the abdomen. Disc levels: Multilevel disc space narrowing greatest at L1-L2 (moderate/advanced at this level). There are shallow multilevel disc bulges with associated osteophyte ridge, most notably at L1-L2. Multilevel facet arthrosis/ligamentum flavum hypertrophy, greatest within the lower lumbar spine. No more than mild narrowing of the central canal at any level. At L5-S1, disc bulge with endplate spurring and facet arthrosis/ligamentum flavum hypertrophy contribute to moderate left subarticular stenosis. Moderate/advanced left-sided neural foraminal narrowing is also present at L5-S1. Additional sites of neural foraminal narrowing most notably as follows. Disc bulge with endplate spurring and facet arthrosis/ligamentum flavum hypertrophy contribute to moderate/severe right-sided neural foraminal narrowing at  L1-L2, moderate right-sided neural foraminal narrowing at L2-L3, moderate bilateral neural foraminal narrowing at L3-L4, and moderate left-sided neural foraminal narrowing at L4-L5. IMPRESSION: A mild L3 superior endplate compression fracture is new from prior exam 03/29/2019, but otherwise age indeterminate. This may be acute. Also new from this prior exam, there is a mildly displaced fracture through a ventrolateral osteophyte along the right lateral aspect of the L2-L3 disc space which may be acute. Lumbar spondylosis as described. No more than mild central canal narrowing at any level. Multifactorial moderate left subarticular narrowing at L5-S1. Multilevel neural foraminal narrowing as detailed. Lumbar levocurvature. Electronically Signed   By: Kellie Simmering DO   On: 01/17/2020 14:53        Scheduled Meds: . darifenacin  7.5 mg Oral Daily  . docusate sodium  100 mg Oral BID  . escitalopram  5 mg Oral Daily  . ezetimibe-simvastatin  1 tablet Oral Daily  . feeding supplement  1 Container Oral BID BM  . feeding supplement (ENSURE ENLIVE)  237 mL Oral BID BM  . furosemide  20 mg Oral Daily  . lisinopril  10 mg Oral Daily  . memantine  10 mg Oral BID  . multivitamin with minerals  1 tablet Oral Daily  . Warfarin - Pharmacist Dosing Inpatient   Does not apply q1800   Continuous Infusions: . sodium chloride 10 mL/hr at 01/18/20 0200     LOS: 1 day     Cordelia Poche, MD [facility] 01/19/2020, 9:59 AM  If 7PM-7AM, please contact night-coverage [email or web address]

## 2020-01-20 ENCOUNTER — Ambulatory Visit: Payer: Medicare HMO | Admitting: Adult Health

## 2020-01-20 LAB — PROTIME-INR
INR: 2.1 — ABNORMAL HIGH (ref 0.8–1.2)
Prothrombin Time: 23 seconds — ABNORMAL HIGH (ref 11.4–15.2)

## 2020-01-20 MED ORDER — WARFARIN SODIUM 5 MG PO TABS
7.5000 mg | ORAL_TABLET | Freq: Once | ORAL | Status: AC
  Administered 2020-01-20: 7.5 mg via ORAL
  Filled 2020-01-20: qty 1

## 2020-01-20 NOTE — Progress Notes (Signed)
Physical Therapy Treatment Patient Details Name: Falesha Henrick MRN: [id number] DOB: [birth year] Today's Date: 01/20/2020    History of Present Illness Charlisha Hamberg Severs is a 86 y.o. female with medical history significant of hypertension, diverticulitis, AVR with mechanical valve on Coumadin, hemorrhagic stroke. Patient presented 01/17/20 with increased  back pain to the point of being unable to ambulate/transfer without significant pain. .CT lumbar spine with a mild L3 superior endplate compression fracture is new from prior exam 03/29/2019, but otherwise age indeterminate. This may be acute. Also new from this prior exam, there is a mildly displaced fracture through a ventrolateral osteophyte along the right lateral aspect of the L2-L3 disc space which may be acute. Lumbar spondylosis as described. No more than mild central canal narrowing at any level. Multifactorial moderate left subarticular narrowing at L5-S1.    PT Comments    Pt resides at [facility] and was Indep amb with her rollator walker.   Assisted to EOB to apply TLSO brace.  Pt required 75% assist to properly apply. Hx dementia and some cognitive impairment.   General Comments: AxO x 2 pleasant required 75% VC's on her back precautions (esp Log Roll) and 25% VC's on safety with turns navigating around obsticlesGeneral bed mobility comments: 75% VC's  on proper "log rolling" tech and increased time.General transfer comment: 75% VC's on proper hand placment and turn completion as well as her new back precautions of no twisting with turns.  Mod unsteady with turns.  HIGH FALL RISK.   General Gait Details: VERY unsteady gait with c/o back pain as well as L knee pain with slight instability.  Increasing unsteady with turns.  HIGH FALL RISK. Pt will need ST Rehab at SNF prior to safely returning to ALF level.  Follow Up Recommendations  SNF;Supervision/Assistance - 24 hour     Equipment Recommendations  None recommended  by PT    Recommendations for Other Services       Precautions / Restrictions Precautions Precautions: Fall;Back Required Braces or Orthoses: Spinal Brace Spinal Brace: Thoracolumbosacral orthotic;Applied in sitting position Restrictions Weight Bearing Restrictions: No    Mobility  Bed Mobility Overal bed mobility: Needs Assistance Bed Mobility: Rolling Rolling: Min assist         General bed mobility comments: 75% VC's  on proper "log rolling" tech and increased time  Transfers Overall transfer level: Needs assistance Equipment used: Rolling walker (2 wheeled) Transfers: Sit to/from [facility] Sit to Stand: Mod assist;Min assist Stand pivot transfers: Mod assist       General transfer comment: 75% VC's on proper hand placment and turn completion as well as her new back precautions of no twisting with turns.  Mod unsteady with turns.  HIGH FALL RISK.  Ambulation/Gait Ambulation/Gait assistance: Mod assist Gait Distance (Feet): 22 Feet Assistive device: Rolling walker (2 wheeled) Gait Pattern/deviations: Step-to pattern;Step-through pattern;Decreased stance time - left Gait velocity: decreased   General Gait Details: VERY unsteady gait with c/o back pain as well as L knee pain with slight instability.  Increasing unsteady with turns.  HIGH FALL RISK.   Stairs             Wheelchair Mobility    Modified Rankin (Stroke Patients Only)       Balance  Cognition Arousal/Alertness: Awake/alert Behavior During Therapy: WFL for tasks assessed/performed Overall Cognitive Status: History of cognitive impairments - at baseline                                 General Comments: AxO x 2 pleasant required 75% VC's on her back precautions (esp Log Roll) and 25% VC's on safety with turns navigating around obsticles      Exercises      General Comments         Pertinent Vitals/Pain Pain Assessment: Faces Faces Pain Scale: Hurts a little bit Pain Location: back Pain Descriptors / Indicators: Discomfort;Moaning;Grimacing;Guarding Pain Intervention(s): Monitored during session;Repositioned    Home Living                      Prior Function            PT Goals (current goals can now be found in the care plan section) Progress towards PT goals: Progressing toward goals    Frequency    Min 2X/week      PT Plan Current plan remains appropriate    Co-evaluation              AM-PAC PT "6 Clicks" Mobility   Outcome Measure  Help needed turning from your back to your side while in a flat bed without using bedrails?: A Lot Help needed moving from lying on your back to sitting on the side of a flat bed without using bedrails?: A Lot Help needed moving to and from a bed to a chair (including a wheelchair)?: A Lot Help needed standing up from a chair using your arms (e.g., wheelchair or bedside chair)?: A Lot Help needed to walk in hospital room?: A Lot Help needed climbing 3-5 steps with a railing? : Total 6 Click Score: 11    End of Session Equipment Utilized During Treatment: Back brace;Gait belt Activity Tolerance: Patient tolerated treatment well Patient left: in bed;with call bell/phone within reach;with bed alarm set Nurse Communication: Mobility status(pt c/o constipation) PT Visit Diagnosis: Unsteadiness on feet (R26.81);Difficulty in walking, not elsewhere classified (R26.2);Pain Pain - Right/Left: Left Pain - part of body: Leg     Time: 1000-1025 PT Time Calculation (min) (ACUTE ONLY): 25 min  Charges:  $Gait Training: 8-22 mins $Therapeutic Activity: 8-22 mins                     Rica Koyanagi  PTA Acute  Rehabilitation Services Pager      [phone] Office      [phone]

## 2020-01-20 NOTE — TOC Progression Note (Addendum)
Transition of Care [facility]) - Progression Note    Patient Details  Name: Virginia Franklin MRN: [id number] Date of Birth: 1929-03-31  Transition of Care [facility]) CM/SW [place], [place] Phone Number: 01/20/2020, 12:17 PM  Clinical Narrative:    Daughter given choice. She chose [facility] pt. Accepted. SNF initiated the authorization request for [facility]. Marcine Matar Review requested documents submitted.     Expected Discharge Plan: Skilled Nursing Facility Barriers to Discharge: Ship broker, Continued Medical Work up  Expected Discharge Plan and Services Expected Discharge Plan: [facility] In-house Referral: Clinical Social Work   Post Acute Care Choice: [facility] Living arrangements for the past 2 months: [facility]                                       Social Determinants of Health (SDOH) Interventions    Readmission Risk Interventions No flowsheet data found.

## 2020-01-20 NOTE — Progress Notes (Signed)
ANTICOAGULATION CONSULT NOTE - Follow Up Consult  Pharmacy Consult for warfarin Indication: mechanical AVR  Allergies  Allergen Reactions  . Penicillins Anaphylaxis and Swelling    Has patient had a PCN reaction causing immediate rash, facial/tongue/throat swelling, SOB or lightheadedness with hypotension: Yes Has patient had a PCN reaction causing severe rash involving mucus membranes or skin necrosis: No Has patient had a PCN reaction that required hospitalization: No Has patient had a PCN reaction occurring within the last 10 years: No If all of the above answers are "NO", then may proceed with Cephalosporin use.    Patient Measurements: Height: 5\' 7"  (170.2 cm) Weight: 130 lb 4.7 oz (59.1 kg) IBW/kg (Calculated) : 61.6 Heparin Dosing Weight:   Vital Signs: Temp: 98.2 F (36.8 C) (03/10 0443) BP: 140/60 (03/10 0443) Pulse Rate: 61 (03/10 0443)  Labs: Recent Labs    01/17/20 1339 01/17/20 1349 01/18/20 0443 01/19/20 0512 01/20/20 0445  HGB  --  14.0 12.8  --   --   HCT  --  43.8 40.3  --   --   PLT  --  250 235  --   --   LABPROT   < >  --  46.6* 39.0* 23.0*  INR   < >  --  5.0* 4.0* 2.1*  CREATININE  --  0.88 0.71  --   --    < > = values in this interval not displayed.    Estimated Creatinine Clearance: 45.4 mL/min (by C-G formula based on SCr of 0.71 mg/dL).   Medications:  - PTA warfarin regimen: 7.5 mg daily except 3.75 mg on TTSun  Assessment: Patient's an 86 y.o F with hx hemorrhagic stroke and mechanical AVR on warfarin PTA, presented to the ED on 3/7 with c/o back pain.  CT lumbar spine showed L3 compression fracture and L2-L3 lateral disc space fracture.  Neurosurgery recom. TLSO brace. Warfarin resumed on admission.  Today, 01/20/2020: - INR now sub-therapeutic at 2.1 (much decreased from 4 yesterday) - no bleeding reported - no signif. drug-drug intxns  Goal of Therapy:  INR goal 2.5-3 (per [facility] clinic's note) Monitor platelets by anticoagulation  protocol: Yes   Plan:  - Warfarin 7.5 mg today - Daily PT/INR - CBC in AM - monitor for s/s bleeding  Ulice Dash D 01/20/2020,10:10 AM

## 2020-01-20 NOTE — Progress Notes (Signed)
PROGRESS NOTE    Virginia Franklin  [id number] DOB: [birth year] DOA: 01/17/2020 PCP: [name], MD   Brief Narrative:86 y.o. femalewith medical history significant ofhypertension, diverticulitis, AVR with mechanical valve on Coumadin, hemorrhagic stroke. Patient presented secondary to worsening back pain and found to have an L3 compression fracture.   Assessment & Plan:   Principal Problem:   Compression fracture of L3 vertebra (HCC) Active Problems:   HTN (hypertension)   Depression with anxiety   Alzheimer's dementia (HCC)   Constipation   Compression fracture ofL3 L2-L3 lateral disc space fracture No evidence of pathologic fracture.Neurosurgery recommending TLSO brace and outpatient follow-up. . -Tylenol, Tramadol, Norco prn -MorphineIV prn for breakthrough -PT/OT consult: PT recommending SNF -TLSO brace with movement/ambulation; off when lying down.  Back pain Secondary to above. management above  Vitamin D deficiency -Vitamin D3 1000 units daily  History of hemorrhagic stroke -Continue Vytorin and Coumadin  History of mechanical aortic valve New INR goal of 2.5 to 3.INR trending down -Coumadin per pharmacy  Chronicanticoagulation Secondary to above. On coumadin. Currently supertherapeutic.  Essential hypertension -Continue lisinopril  Depression/anxiety -Continue Lexapro  Dementia Patient on Namenda as an outpatient -Continue Namenda  Urinary -Continue Vesicare     Nutrition Problem: Increased nutrient needs Etiology: acute illness     Signs/Symptoms: estimated needs    Interventions: Ensure Enlive (each supplement provides 350kcal and 20 grams of protein), MVI, Boost Breeze  Estimated body mass index is 20.41 kg/m as calculated from the following:   Height as of this encounter: 5\' 7"  (1.702 m).   Weight as of this encounter: 59.1 kg.  DVT prophylaxis: Coumadin Code Status:  DNR Family  Communication:none Disposition Plan: Patient to be discharged to SNF, pending insurance authorization.  Consultants:   Neurosurgery (Emergency Department)  Procedures:   None  Antimicrobials:  None   Subjective: No complaints anxious to go to rehab  Objective: Vitals:   01/19/20 2049 01/20/20 0443 01/20/20 1000 01/20/20 1353  BP: (!) 131/56 140/60 (!) 156/51 126/70  Pulse: 69 61 65 73  Resp: 18 16 18    Temp: 99.1 F (37.3 C) 98.2 F (36.8 C) 98.2 F (36.8 C) 98.4 F (36.9 C)  TempSrc: Oral  Oral Oral  SpO2: 97% 96% 97% 97%  Weight:      Height:        Intake/Output Summary (Last 24 hours) at 01/20/2020 1459 Last data filed at 01/20/2020 1400 Gross per 24 hour  Intake 720 ml  Output 0 ml  Net 720 ml   Filed Weights   01/18/20 0640  Weight: 59.1 kg    Examination:  General exam: Appears calm and comfortable  Respiratory system: Clear to auscultation. Respiratory effort normal. Cardiovascular system: S1 & S2 heard, RRR. No JVD, murmurs, rubs, gallops or clicks. No pedal edema. Gastrointestinal system: Abdomen is nondistended, soft and nontender. No organomegaly or masses felt. Normal bowel sounds heard. Central nervous system: Alert and oriented. No focal neurological deficits. Extremities: Symmetric 5 x 5 power. Skin: No rashes, lesions or ulcers Psychiatry: Judgement and insight appear normal. Mood & affect appropriate.     Data Reviewed: I have personally reviewed following labs and imaging studies  CBC: Recent Labs  Lab 01/17/20 1349 01/18/20 0443  WBC 6.8 7.2  NEUTROABS 4.9  --   HGB 14.0 12.8  HCT 43.8 40.3  MCV 92.6 94.4  PLT 250 [id number]   Basic Metabolic Panel: Recent Labs  Lab 01/17/20 1349 01/18/20 0443  NA 137 137  K 3.6 3.6  CL 100 105  CO2 26 24  GLUCOSE 103* 112*  BUN 27* 21  CREATININE 0.88 0.71  CALCIUM 9.3 8.8*   GFR: Estimated Creatinine Clearance: 45.4 mL/min (by C-G formula based on SCr of 0.71 mg/dL). Liver  Function Tests: Recent Labs  Lab 01/17/20 1349  AST 17  ALT 15  ALKPHOS 111  BILITOT 0.8  PROT 7.8  ALBUMIN 4.1   No results for input(s): LIPASE, AMYLASE in the last 168 hours. No results for input(s): AMMONIA in the last 168 hours. Coagulation Profile: Recent Labs  Lab 01/17/20 1339 01/18/20 0443 01/19/20 0512 01/20/20 0445  INR 5.1* 5.0* 4.0* 2.1*   Cardiac Enzymes: No results for input(s): CKTOTAL, CKMB, CKMBINDEX, TROPONINI in the last 168 hours. BNP (last 3 results) No results for input(s): PROBNP in the last 8760 hours. HbA1C: No results for input(s): HGBA1C in the last 72 hours. CBG: No results for input(s): GLUCAP in the last 168 hours. Lipid Profile: No results for input(s): CHOL, HDL, LDLCALC, TRIG, CHOLHDL, LDLDIRECT in the last 72 hours. Thyroid Function Tests: No results for input(s): TSH, T4TOTAL, FREET4, T3FREE, THYROIDAB in the last 72 hours. Anemia Panel: No results for input(s): VITAMINB12, FOLATE, FERRITIN, TIBC, IRON, RETICCTPCT in the last 72 hours. Sepsis Labs: No results for input(s): PROCALCITON, LATICACIDVEN in the last 168 hours.  Recent Results (from the past 240 hour(s))  SARS CORONAVIRUS 2 (TAT 6-24 HRS) Nasopharyngeal Nasopharyngeal Swab     Status: None   Collection Time: 01/17/20  6:04 PM   Specimen: Nasopharyngeal Swab  Result Value Ref Range Status   SARS Coronavirus 2 NEGATIVE NEGATIVE Final    Comment: (NOTE) SARS-CoV-2 target nucleic acids are NOT DETECTED. The SARS-CoV-2 RNA is generally detectable in upper and lower respiratory specimens during the acute phase of infection. Negative results do not preclude SARS-CoV-2 infection, do not rule out co-infections with other pathogens, and should not be used as the sole basis for treatment or other patient management decisions. Negative results must be combined with clinical observations, patient history, and epidemiological information. The expected result is Negative. Fact  Sheet for Patients: SugarRoll.be Fact Sheet for Healthcare Providers: https://www.woods-.com/ This test is not yet approved or cleared by the [place] FDA and  has been authorized for detection and/or diagnosis of SARS-CoV-2 by FDA under an Emergency Use Authorization (EUA). This EUA will remain  in effect (meaning this test can be used) for the duration of the COVID-19 declaration under Section 56 4(b)(1) of the Act, 21 U.S.C. section 360bbb-3(b)(1), unless the authorization is terminated or revoked sooner. Performed at [facility], [place] [address] [place], [place] [address]          Radiology Studies: No results found.      Scheduled Meds: . cholecalciferol  1,000 Units Oral Daily  . darifenacin  7.5 mg Oral Daily  . docusate sodium  100 mg Oral BID  . escitalopram  5 mg Oral Daily  . ezetimibe-simvastatin  1 tablet Oral Daily  . feeding supplement  1 Container Oral BID BM  . feeding supplement (ENSURE ENLIVE)  237 mL Oral BID BM  . furosemide  20 mg Oral Daily  . lisinopril  10 mg Oral Daily  . memantine  10 mg Oral BID  . multivitamin with minerals  1 tablet Oral Daily  . Warfarin - Pharmacist Dosing Inpatient   Does not apply q1800   Continuous Infusions: . sodium chloride 10 mL/hr at 01/18/20 0200  LOS: 2 days     Georgette Shell, MD  01/20/2020, 2:59 PM

## 2020-01-20 NOTE — Progress Notes (Signed)
Transition of Care (TOC) -Dementia Note   Patient Details Name:Virginia Franklin [id number] Date of Birth:1929-02-26  Transition of Care [facility]) CM/SW Contact Name:Galileo Colello Phone [phone] Date:01/20/2020 Time:11:50AM  MUST ID:   To Whom it May Concern:  Please be advised that the above-named patient has a primary diagnosis of dementia which supersedes any psychiatric diagnosis.

## 2020-01-21 LAB — CBC
HCT: 40.4 % (ref 36.0–46.0)
Hemoglobin: 12.9 g/dL (ref 12.0–15.0)
MCH: 29.5 pg (ref 26.0–34.0)
MCHC: 31.9 g/dL (ref 30.0–36.0)
MCV: 92.2 fL (ref 80.0–100.0)
Platelets: 276 10*3/uL (ref 150–400)
RBC: 4.38 MIL/uL (ref 3.87–5.11)
RDW: 12.3 % (ref 11.5–15.5)
WBC: 5.1 10*3/uL (ref 4.0–10.5)
nRBC: 0 % (ref 0.0–0.2)

## 2020-01-21 LAB — PROTIME-INR
INR: 1.5 — ABNORMAL HIGH (ref 0.8–1.2)
Prothrombin Time: 17.9 seconds — ABNORMAL HIGH (ref 11.4–15.2)

## 2020-01-21 LAB — SARS CORONAVIRUS 2 (TAT 6-24 HRS): SARS Coronavirus 2: NEGATIVE

## 2020-01-21 MED ORDER — WARFARIN SODIUM 5 MG PO TABS
7.5000 mg | ORAL_TABLET | Freq: Once | ORAL | Status: AC
  Administered 2020-01-21: 7.5 mg via ORAL
  Filled 2020-01-21: qty 1

## 2020-01-21 NOTE — Progress Notes (Signed)
PROGRESS NOTE    Pennelope Forand Coven  [id number] DOB: [birth year] DOA: 01/17/2020 PCP: [name], MD   Brief Narrative:86 y.o.femalewith medical history significant ofhypertension, diverticulitis, AVR with mechanical valve on Coumadin, hemorrhagic stroke. Patient presented secondary to worsening back pain and found to have an L3 compression fracture.    Assessment & Plan:   Principal Problem:   Compression fracture of L3 lumbar vertebra, closed, initial encounter ([place]) Active Problems:   HTN (hypertension)   Depression with anxiety   Alzheimer's dementia ([place])   Constipation  Compression fracture ofL3 L2-L3 lateral disc space fracture No evidence of pathologic fracture.Neurosurgery recommending TLSO brace and outpatient follow-up. . -Tylenol, Tramadol, Norco prn -MorphineIV prn for breakthrough -PT/OT consult: PT recommending SNF -TLSO brace with movement/ambulation; off when lying down.  Back pain Secondary to above. management above  Vitamin D deficiency -Vitamin D3 1000 units daily  History of hemorrhagic stroke -Continue Vytorin and Coumadin  History of mechanical aortic valve New INR goal of 2.5 to 3.INR trending down -Coumadin per pharmacy  Chronicanticoagulation Secondary to above. On coumadin. Currently supertherapeutic.  Essential hypertension -Continue lisinopril  Depression/anxiety -ContinueLexapro   Dementia Patient on Namenda as an outpatient -Continue Namenda  Urinary -Continue Vesicare    Nutrition Problem: Increased nutrient needs Etiology: acute illness     Signs/Symptoms: estimated needs    Interventions: Ensure Enlive (each supplement provides 350kcal and 20 grams of protein), MVI, Boost Breeze  Estimated body mass index is 20.41 kg/m as calculated from the following:   Height as of this encounter: 5\' 7"  (1.702 m).   Weight as of this encounter: 59.1 kg.  DVT prophylaxis:Coumadin Code Status:  DNR Family Communication:none Disposition Plan: Patient to be discharged to SNF, pending insurance authorization.  Consultants:  Neurosurgery (Emergency Department)  Procedures:  None  Antimicrobials:  None   Subjective:  Ambulating in hallway with therapy no new complaints anxious to start therapy Objective: Vitals:   01/20/20 1353 01/20/20 2149 01/21/20 0615 01/21/20 1347  BP: 126/70 (!) 150/65 (!) 125/106 (!) 162/62  Pulse: 73 69 76 80  Resp:  18 16 14   Temp: 98.4 F (36.9 C) 97.7 F (36.5 C) 98.2 F (36.8 C) 98.2 F (36.8 C)  TempSrc: Oral Oral Oral Oral  SpO2: 97% 96% 96% 96%  Weight:      Height:        Intake/Output Summary (Last 24 hours) at 01/21/2020 1709 Last data filed at 01/21/2020 1400 Gross per 24 hour  Intake 514 ml  Output -  Net 514 ml   Filed Weights   01/18/20 0640  Weight: 59.1 kg    Examination:  General exam: Appears calm and comfortable sitting up wearing brace  Respiratory system: Clear to auscultation. Respiratory effort normal. Cardiovascular system: S1 & S2 heard, RRR. No JVD, murmurs, rubs, gallops or clicks. No pedal edema. Gastrointestinal system: Abdomen is nondistended, soft and nontender. No organomegaly or masses felt. Normal bowel sounds heard. Central nervous system: Alert and oriented. No focal neurological deficits. Extremities: Symmetric 5 x 5 power. Skin: No rashes, lesions or ulcers Psychiatry: Judgement and insight appear normal. Mood & affect appropriate.     Data Reviewed: I have personally reviewed following labs and imaging studies  CBC: Recent Labs  Lab 01/17/20 1349 01/18/20 0443 01/21/20 0426  WBC 6.8 7.2 5.1  NEUTROABS 4.9  --   --   HGB 14.0 12.8 12.9  HCT 43.8 40.3 40.4  MCV 92.6 94.4 92.2  PLT 250 235 276  Basic Metabolic Panel: Recent Labs  Lab 01/17/20 1349 01/18/20 0443  NA 137 137  K 3.6 3.6  CL 100 105  CO2 26 24  GLUCOSE 103* 112*  BUN 27* 21  CREATININE 0.88 0.71   CALCIUM 9.3 8.8*   GFR: Estimated Creatinine Clearance: 45.4 mL/min (by C-G formula based on SCr of 0.71 mg/dL). Liver Function Tests: Recent Labs  Lab 01/17/20 1349  AST 17  ALT 15  ALKPHOS 111  BILITOT 0.8  PROT 7.8  ALBUMIN 4.1   No results for input(s): LIPASE, AMYLASE in the last 168 hours. No results for input(s): AMMONIA in the last 168 hours. Coagulation Profile: Recent Labs  Lab 01/17/20 1339 01/18/20 0443 01/19/20 0512 01/20/20 0445 01/21/20 0426  INR 5.1* 5.0* 4.0* 2.1* 1.5*   Cardiac Enzymes: No results for input(s): CKTOTAL, CKMB, CKMBINDEX, TROPONINI in the last 168 hours. BNP (last 3 results) No results for input(s): PROBNP in the last 8760 hours. HbA1C: No results for input(s): HGBA1C in the last 72 hours. CBG: No results for input(s): GLUCAP in the last 168 hours. Lipid Profile: No results for input(s): CHOL, HDL, LDLCALC, TRIG, CHOLHDL, LDLDIRECT in the last 72 hours. Thyroid Function Tests: No results for input(s): TSH, T4TOTAL, FREET4, T3FREE, THYROIDAB in the last 72 hours. Anemia Panel: No results for input(s): VITAMINB12, FOLATE, FERRITIN, TIBC, IRON, RETICCTPCT in the last 72 hours. Sepsis Labs: No results for input(s): PROCALCITON, LATICACIDVEN in the last 168 hours.  Recent Results (from the past 240 hour(s))  SARS CORONAVIRUS 2 (TAT 6-24 HRS) Nasopharyngeal Nasopharyngeal Swab     Status: None   Collection Time: 01/17/20  6:04 PM   Specimen: Nasopharyngeal Swab  Result Value Ref Range Status   SARS Coronavirus 2 NEGATIVE NEGATIVE Final    Comment: (NOTE) SARS-CoV-2 target nucleic acids are NOT DETECTED. The SARS-CoV-2 RNA is generally detectable in upper and lower respiratory specimens during the acute phase of infection. Negative results do not preclude SARS-CoV-2 infection, do not rule out co-infections with other pathogens, and should not be used as the sole basis for treatment or other patient management decisions. Negative  results must be combined with clinical observations, patient history, and epidemiological information. The expected result is Negative. Fact Sheet for Patients: SugarRoll.be Fact Sheet for Healthcare Providers: https://www.woods-.com/ This test is not yet approved or cleared by the [place] FDA and  has been authorized for detection and/or diagnosis of SARS-CoV-2 by FDA under an Emergency Use Authorization (EUA). This EUA will remain  in effect (meaning this test can be used) for the duration of the COVID-19 declaration under Section 56 4(b)(1) of the Act, 21 U.S.C. section 360bbb-3(b)(1), unless the authorization is terminated or revoked sooner. Performed at [facility], [place] [address], [place], [place] [address]   SARS CORONAVIRUS 2 (TAT 6-24 HRS) Nasopharyngeal Nasopharyngeal Swab     Status: None   Collection Time: 01/20/20  3:55 PM   Specimen: Nasopharyngeal Swab  Result Value Ref Range Status   SARS Coronavirus 2 NEGATIVE NEGATIVE Final    Comment: (NOTE) SARS-CoV-2 target nucleic acids are NOT DETECTED. The SARS-CoV-2 RNA is generally detectable in upper and lower respiratory specimens during the acute phase of infection. Negative results do not preclude SARS-CoV-2 infection, do not rule out co-infections with other pathogens, and should not be used as the sole basis for treatment or other patient management decisions. Negative results must be combined with clinical observations, patient history, and epidemiological information. The expected result is Negative.  Fact Sheet for Patients: SugarRoll.be Fact Sheet for Healthcare Providers: https://www.woods-[place].com/ This test is not yet approved or cleared by the [place] FDA and  has been authorized for detection and/or diagnosis of SARS-CoV-2 by FDA under an Emergency Use Authorization (EUA). This EUA will remain  in  effect (meaning this test can be used) for the duration of the COVID-19 declaration under Section 56 4(b)(1) of the Act, 21 U.S.C. section 360bbb-3(b)(1), unless the authorization is terminated or revoked sooner. Performed at [facility], [place] [address], [place], [place] [address]          Radiology Studies: No results found.      Scheduled Meds: . cholecalciferol  1,000 Units Oral Daily  . darifenacin  7.5 mg Oral Daily  . docusate sodium  100 mg Oral BID  . escitalopram  5 mg Oral Daily  . ezetimibe-simvastatin  1 tablet Oral Daily  . feeding supplement  1 Container Oral BID BM  . feeding supplement (ENSURE ENLIVE)  237 mL Oral BID BM  . furosemide  20 mg Oral Daily  . lisinopril  10 mg Oral Daily  . memantine  10 mg Oral BID  . multivitamin with minerals  1 tablet Oral Daily  . Warfarin - Pharmacist Dosing Inpatient   Does not apply q1800   Continuous Infusions: . sodium chloride 10 mL/hr at 01/18/20 0200     LOS: 3 days     Georgette Shell, MD 01/21/2020, 5:09 PM

## 2020-01-21 NOTE — Care Management Important Message (Signed)
Important Message  Patient Details IM Letter given to [place] Case Manager to present to the Patient Name: Virginia Franklin MRN: [id number] Date of Birth: July 17, 1929   Medicare Important Message Given:  Yes     Kerin Salen 01/21/2020, 5:04 PM

## 2020-01-21 NOTE — TOC Progression Note (Signed)
Transition of Care [facility]) - Progression Note    Patient Details  Name: Virginia Franklin MRN: [id number] Date of Birth: [birth year]  Transition of Care [facility]) CM/SW [place], LCSW Phone Number: 01/21/2020, 11:04 AM  Clinical Narrative:    Per SNF, the [place] is still pending. PASRR received. [id number] A CSW provided update to patient and daughter.     Expected Discharge Plan: Skilled Nursing Facility Barriers to Discharge: Insurance Authorization  Expected Discharge Plan and Services Expected Discharge Plan: [place] In-house Referral: Clinical Social Work   Post Acute Care Choice: [place] Living arrangements for the past 2 months: [place]                                       Social Determinants of Health (SDOH) Interventions    Readmission Risk Interventions No flowsheet data found.

## 2020-01-21 NOTE — Progress Notes (Signed)
Physical Therapy Treatment Patient Details Name: Virginia Franklin MRN: [id number] DOB: November 18, 1928 Today's Date: 01/21/2020    History of Present Illness Virginia Franklin is a 86 y.o. female with medical history significant of hypertension, diverticulitis, AVR with mechanical valve on Coumadin, hemorrhagic stroke. Patient presented 01/17/20 with increased  back pain to the point of being unable to ambulate/transfer without significant pain. .CT lumbar spine with a mild L3 superior endplate compression fracture is new from prior exam 03/29/2019, but otherwise age indeterminate. This may be acute. Also new from this prior exam, there is a mildly displaced fracture through a ventrolateral osteophyte along the right lateral aspect of the L2-L3 disc space which may be acute. Lumbar spondylosis as described. No more than mild central canal narrowing at any level. Multifactorial moderate left subarticular narrowing at L5-S1.    PT Comments    Assisted OOB to amb.  General transfer comment: 75% VC's on proper hand placment and turn completion as well as her new back precautions of no twisting with turns.  Mod unsteady with turns.  HIGH FALL RISK. General Gait Details: VERY unsteady gait with c/o back pain as well as L knee pain with slight instability.  Increasing unsteady with turns.  HIGH FALL RISK.  Follow Up Recommendations  SNF     Equipment Recommendations  None recommended by PT    Recommendations for Other Services       Precautions / Restrictions Precautions Precautions: Fall;Back Required Braces or Orthoses: Spinal Brace Spinal Brace: Thoracolumbosacral orthotic;Applied in sitting position Restrictions Weight Bearing Restrictions: No    Mobility  Bed Mobility Overal bed mobility: Needs Assistance Bed Mobility: Rolling;Supine to Sit Rolling: Min guard;Supervision   Supine to sit: Min guard     General bed mobility comments: 75% VC's  on proper "log rolling" tech and  increased time  Transfers Overall transfer level: Needs assistance Equipment used: Rolling walker (2 wheeled) Transfers: Sit to/from [facility] Sit to Stand: Min guard Stand pivot transfers: Min guard;Min assist       General transfer comment: 75% VC's on proper hand placment and turn completion as well as her new back precautions of no twisting with turns.  Mod unsteady with turns.  HIGH FALL RISK.  Ambulation/Gait Ambulation/Gait assistance: Min assist Gait Distance (Feet): 28 Feet Assistive device: Rolling walker (2 wheeled) Gait Pattern/deviations: Step-to pattern;Step-through pattern;Decreased stance time - left Gait velocity: decreased   General Gait Details: VERY unsteady gait with c/o back pain as well as L knee pain with slight instability.  Increasing unsteady with turns.  HIGH FALL RISK.   Stairs             Wheelchair Mobility    Modified Rankin (Stroke Patients Only)       Balance                                            Cognition Arousal/Alertness: Awake/alert Behavior During Therapy: WFL for tasks assessed/performed Overall Cognitive Status: History of cognitive impairments - at baseline                                 General Comments: AxO x 2 but requires instruction on her back precautions and proper use /application TLSO.  "That feels good" stated pt.      Exercises  General Comments        Pertinent Vitals/Pain Pain Assessment: No/denies pain    Home Living                      Prior Function            PT Goals (current goals can now be found in the care plan section) Progress towards PT goals: Progressing toward goals    Frequency    Min 2X/week      PT Plan Current plan remains appropriate    Co-evaluation              AM-PAC PT "6 Clicks" Mobility   Outcome Measure  Help needed turning from your back to your side while in a flat bed without  using bedrails?: A Lot Help needed moving from lying on your back to sitting on the side of a flat bed without using bedrails?: A Lot Help needed moving to and from a bed to a chair (including a wheelchair)?: A Lot Help needed standing up from a chair using your arms (e.g., wheelchair or bedside chair)?: A Lot Help needed to walk in hospital room?: A Lot Help needed climbing 3-5 steps with a railing? : Total 6 Click Score: 11    End of Session Equipment Utilized During Treatment: Back brace;Gait belt Activity Tolerance: Patient tolerated treatment well Patient left: in bed;with call bell/phone within reach;with bed alarm set Nurse Communication: Mobility status PT Visit Diagnosis: Unsteadiness on feet (R26.81);Difficulty in walking, not elsewhere classified (R26.2);Pain Pain - Right/Left: Left     Time: [id number] PT Time Calculation (min) (ACUTE ONLY): 24 min  Charges:  $Gait Training: 8-22 mins $Therapeutic Activity: 8-22 mins                     Rica Koyanagi  PTA Acute  Rehabilitation Services Pager      [phone] Office      [phone]

## 2020-01-21 NOTE — Progress Notes (Addendum)
ANTICOAGULATION CONSULT NOTE - Follow Up Consult  Pharmacy Consult for warfarin Indication: mechanical AVR  Allergies  Allergen Reactions  . Penicillins Anaphylaxis and Swelling    Has patient had a PCN reaction causing immediate rash, facial/tongue/throat swelling, SOB or lightheadedness with hypotension: Yes Has patient had a PCN reaction causing severe rash involving mucus membranes or skin necrosis: No Has patient had a PCN reaction that required hospitalization: No Has patient had a PCN reaction occurring within the last 10 years: No If all of the above answers are "NO", then may proceed with Cephalosporin use.    Patient Measurements: Height: 5\' 7"  (170.2 cm) Weight: 130 lb 4.7 oz (59.1 kg) IBW/kg (Calculated) : 61.6 Heparin Dosing Weight:   Vital Signs: Temp: 98.2 F (36.8 C) (03/11 0615) Temp Source: Oral (03/11 0615) BP: 125/106 (03/11 0615) Pulse Rate: 76 (03/11 0615)  Labs: Recent Labs    01/19/20 0512 01/20/20 0445 01/21/20 0426  HGB  --   --  12.9  HCT  --   --  40.4  PLT  --   --  276  LABPROT 39.0* 23.0* 17.9*  INR 4.0* 2.1* 1.5*    Estimated Creatinine Clearance: 45.4 mL/min (by C-G formula based on SCr of 0.71 mg/dL).   Medications:  - PTA warfarin regimen: 7.5 mg daily except 3.75 mg on TTSun  Assessment: Patient's an 86 y.o F with hx hemorrhagic stroke and mechanical AVR on warfarin PTA, presented to the ED on 3/7 with c/o back pain.  CT lumbar spine showed L3 compression fracture and L2-L3 lateral disc space fracture.  Neurosurgery recom. TLSO brace.   Today, 01/21/2020: - INR now sub-therapeutic at 1.5  - CBC stable - no bleeding reported - no signif. drug-drug intxns  Goal of Therapy:  INR goal 2.5-3 (per [facility] clinic's note) Monitor platelets by anticoagulation protocol: Yes   Plan:  - Warfarin 7.5 mg today - Daily PT/INR - monitor for s/s bleeding  Napoleon Form 01/21/2020,8:46 AM

## 2020-01-22 LAB — PROTIME-INR
INR: 1.3 — ABNORMAL HIGH (ref 0.8–1.2)
Prothrombin Time: 16.4 seconds — ABNORMAL HIGH (ref 11.4–15.2)

## 2020-01-22 MED ORDER — POLYETHYLENE GLYCOL 3350 17 G PO PACK: 17.0000 g | PACK | Freq: Every day | ORAL | 0 refills | Status: DC | PRN

## 2020-01-22 MED ORDER — DOCUSATE SODIUM 100 MG PO CAPS: 100.0000 mg | ORAL_CAPSULE | Freq: Two times a day (BID) | ORAL | 0 refills | Status: DC

## 2020-01-22 NOTE — Discharge Summary (Signed)
Physician Discharge Summary  Virginia Franklin [id number] DOB: 1929-11-09 DOA: 01/17/2020  PCP: [name], MD  Admit date: 01/17/2020 Discharge date: 01/22/2020  Admitted From: home Disposition:  home Recommendations for Outpatient Follow-up:  1. Follow up with PCP in 1-2 weeks 2. Please obtain BMP/CBC in one week  Home Health:pt ot Equipment/Devices:walker   Discharge Condition:stable CODE STATUS:full Diet recommendation: cardiac Brief/Interim Summary:86 y.o.femalewith medical history significant ofhypertension, diverticulitis, AVR with mechanical valve on Coumadin, hemorrhagic stroke. Patient presented secondary to worsening back pain and found to have an L3 compression fracture.   Discharge Diagnoses:  Principal Problem:   Compression fracture of L3 lumbar vertebra, closed, initial encounter ([place]) Active Problems:   HTN (hypertension)   Depression with anxiety   Alzheimer's dementia ([place])   Constipation   Compression fracture ofL3 L2-L3 lateral disc space fracture No evidence of pathologic fracture.Neurosurgery recommending TLSO brace and outpatient follow-up.-TLSO brace with movement/ambulation; off when lying down.  Vitamin D deficiency -Vitamin D3 1000 units daily  History of hemorrhagic stroke -Continue Vytorin   History of mechanical aortic valve-continue coumadin  Essential hypertension -Continue lisinopril  Depression/anxiety -Continue Lexapro  Dementia -Continue Namenda  Urinary -Continue Vesicare  Nutrition Problem: Increased nutrient needs Etiology: acute illness    Signs/Symptoms: estimated needs     Interventions: Ensure Enlive (each supplement provides 350kcal and 20 grams of protein), MVI, Boost Breeze  Estimated body mass index is 20.41 kg/m as calculated from the following:   Height as of this encounter: 5\' 7"  (1.702 m).   Weight as of this encounter: 59.1 kg.  Discharge Instructions  Discharge Instructions     Diet - low sodium heart healthy   Complete by: As directed    Diet - low sodium heart healthy   Complete by: As directed    Increase activity slowly   Complete by: As directed    Increase activity slowly   Complete by: As directed      Allergies as of 01/22/2020      Reactions   Penicillins Anaphylaxis, Swelling   Has patient had a PCN reaction causing immediate rash, facial/tongue/throat swelling, SOB or lightheadedness with hypotension: Yes Has patient had a PCN reaction causing severe rash involving mucus membranes or skin necrosis: No Has patient had a PCN reaction that required hospitalization: No Has patient had a PCN reaction occurring within the last 10 years: No If all of the above answers are "NO", then may proceed with Cephalosporin use.      Medication List    TAKE these medications   docusate sodium 100 MG capsule Commonly known as: COLACE Take 1 capsule (100 mg total) by mouth 2 (two) times daily.   escitalopram 5 MG tablet Commonly known as: LEXAPRO TAKE 1 TABLET DAILY   ezetimibe-simvastatin 10-40 MG tablet Commonly known as: VYTORIN Take 1 tablet by mouth daily.   furosemide 20 MG tablet Commonly known as: LASIX TAKE 1 TABLET DAILY   lisinopril 10 MG tablet Commonly known as: ZESTRIL Take 1 tablet (10 mg total) by mouth daily.   memantine 10 MG tablet Commonly known as: NAMENDA Take 10 mg by mouth 2 (two) times daily.   polyethylene glycol 17 g packet Commonly known as: MIRALAX / GLYCOLAX Take 17 g by mouth daily as needed for mild constipation.   solifenacin 5 MG tablet Commonly known as: VESICARE Take 5 mg by mouth daily.   traMADol 50 MG tablet Commonly known as: ULTRAM Take 2 tablets (100 mg total) by mouth  every 6 (six) hours as needed for moderate pain. What changed: when to take this   warfarin 7.5 MG tablet Commonly known as: COUMADIN Take as directed. If you are unsure how to take this medication, talk to your nurse or  doctor. Original instructions: Take 0.5-1 tablets (3.75-7.5 mg total) by mouth See admin instructions. At d/c from hospital 11/2 - take 7.5 daily at 6p. Next lab draw 11/4. Adjust dose per coumadin clinic What changed: additional instructions            Durable Medical Equipment  (From admission, onward)         Start     Ordered   01/22/20 0940  For home use only DME Walker rolling  Once    Question Answer Comment  Walker: With [place] 5 Wheels   Patient needs a walker to treat with the following condition Unsteady gait      01/22/20 0939          Contact information for follow-up providers    [name], MD Follow up.   Specialty: Family Medicine Contact information: [place] [address] [phone]        Belva Crome, MD .   Specialty: Cardiology Contact information: [phone] [address] [place] [address] [phone]        Elberta Spaniel, MD Follow up.   Specialty: Neurology           Contact information for after-discharge care    Destination    [facility] .   Service: Skilled Nursing Contact information: [place] [address] [phone]                 Allergies  Allergen Reactions  . Penicillins Anaphylaxis and Swelling    Has patient had a PCN reaction causing immediate rash, facial/tongue/throat swelling, SOB or lightheadedness with hypotension: Yes Has patient had a PCN reaction causing severe rash involving mucus membranes or skin necrosis: No Has patient had a PCN reaction that required hospitalization: No Has patient had a PCN reaction occurring within the last 10 years: No If all of the above answers are "NO", then may proceed with Cephalosporin use.    Consultations: edp dw neuro surgery  Procedures/Studies: DG Lumbar Spine Complete  Result Date: 01/17/2020 CLINICAL DATA:  Low back pain for the past 3 days. No recent  fall. History of rheumatoid arthritis. EXAM: LUMBAR SPINE - COMPLETE 4+ VIEW COMPARISON:  None. FINDINGS: There are 5 non rib-bearing lumbar type vertebral bodies. Mild scoliotic curvature of the thoracolumbar spine with dominant caudal component convex the left measuring approximately 12 degrees (as measured from the superior endplate of 624THL to the inferior endplate of L4). There is mild straightening expected lumbar lordosis. No anterolisthesis or retrolisthesis. No definite pars defects. Lumbar vertebral body heights appear preserved given obliquity. Mild to moderate multilevel lumbar spine DDD, worse at T12-L1 and L1-L2 with disc space height loss, endplate irregularity and sclerosis. Limited visualization of the bilateral SI joints is normal. Mild degenerative change of the bilateral hips is suspected though incompletely evaluated. Calcified lymph nodes or phleboliths overlie the right mid hemiabdomen. Additional phleboliths overlie the left hemipelvis. Atherosclerotic plaque within the abdominal aorta. IMPRESSION: 1. No acute findings. 2. Mild-to-moderate multilevel lumbar spine DDD, worse at T12-L1 and L1-L2. 3. Calcified lymph nodes versus prominent phleboliths overlie the right mid hemi pelvis. Electronically Signed   By: Eldridge Abrahams.D.  On: 01/17/2020 13:49   CT Lumbar Spine Wo Contrast  Result Date: 01/17/2020 CLINICAL DATA:  Low back pain. Additional history provided: Report of low back pain for 3 days, observed by staff to be "part on and part off of bed." EXAM: CT LUMBAR SPINE WITHOUT CONTRAST TECHNIQUE: Multidetector CT imaging of the lumbar spine was performed without intravenous contrast administration. Multiplanar CT image reconstructions were also generated. COMPARISON:  Radiographs of the lumbar spine 01/17/2020, CT abdomen/pelvis 03/29/2019 FINDINGS: Segmentation: 5 lumbar vertebrae. Alignment: Lumbar levocurvature. Straightening of the expected lumbar lordosis. L1-L2 grade 1  retrolisthesis. Trace L5-S1 anterolisthesis Vertebrae: A mild L3 superior endplate compression deformity is new from prior exam 03/29/2019, but otherwise age indeterminate. Also new from prior examination 03/29/2019, there is a mildly displaced fracture through a ventrolateral osteophyte along the right lateral aspect of the L2-L3 disc space (series 8, image 17). This may be acute. Multilevel degenerative endplate irregularity with degenerative endplate sclerosis. The bones are diffusely demineralized. Paraspinal and other soft tissues: Calcified periaortic lymph nodes. Aortoiliac atherosclerosis. No other abnormality identified within included portions of the abdomen. Disc levels: Multilevel disc space narrowing greatest at L1-L2 (moderate/advanced at this level). There are shallow multilevel disc bulges with associated osteophyte ridge, most notably at L1-L2. Multilevel facet arthrosis/ligamentum flavum hypertrophy, greatest within the lower lumbar spine. No more than mild narrowing of the central canal at any level. At L5-S1, disc bulge with endplate spurring and facet arthrosis/ligamentum flavum hypertrophy contribute to moderate left subarticular stenosis. Moderate/advanced left-sided neural foraminal narrowing is also present at L5-S1. Additional sites of neural foraminal narrowing most notably as follows. Disc bulge with endplate spurring and facet arthrosis/ligamentum flavum hypertrophy contribute to moderate/severe right-sided neural foraminal narrowing at L1-L2, moderate right-sided neural foraminal narrowing at L2-L3, moderate bilateral neural foraminal narrowing at L3-L4, and moderate left-sided neural foraminal narrowing at L4-L5. IMPRESSION: A mild L3 superior endplate compression fracture is new from prior exam 03/29/2019, but otherwise age indeterminate. This may be acute. Also new from this prior exam, there is a mildly displaced fracture through a ventrolateral osteophyte along the right lateral  aspect of the L2-L3 disc space which may be acute. Lumbar spondylosis as described. No more than mild central canal narrowing at any level. Multifactorial moderate left subarticular narrowing at L5-S1. Multilevel neural foraminal narrowing as detailed. Lumbar levocurvature. Electronically Signed   By: Kellie Simmering DO   On: 01/17/2020 14:53    (Echo, Carotid, EGD, Colonoscopy, ERCP)    Subjective: Ambulating in hall way  Discharge Exam: Vitals:   01/21/20 2310 01/22/20 0630  BP: (!) 150/74 (!) 160/80  Pulse: 87   Resp: 16 16  Temp: 99 F (37.2 C) 98.2 F (36.8 C)  SpO2: 98% 98%   Vitals:   01/21/20 0615 01/21/20 1347 01/21/20 2310 01/22/20 0630  BP: (!) 125/106 (!) 162/62 (!) 150/74 (!) 160/80  Pulse: 76 80 87   Resp: 16 14 16 16   Temp: 98.2 F (36.8 C) 98.2 F (36.8 C) 99 F (37.2 C) 98.2 F (36.8 C)  TempSrc: Oral Oral Oral Oral  SpO2: 96% 96% 98% 98%  Weight:      Height:        General: Pt is alert, awake, not in acute distress Cardiovascular: RRR, S1/S2 +, no rubs, no gallops Respiratory: CTA bilaterally, no wheezing, no rhonchi Abdominal: Soft, NT, ND, bowel sounds + Extremities: no edema, no cyanosis    The results of significant diagnostics from this hospitalization (including imaging, microbiology, ancillary and  laboratory) are listed below for reference.     Microbiology: Recent Results (from the past 240 hour(s))  SARS CORONAVIRUS 2 (TAT 6-24 HRS) Nasopharyngeal Nasopharyngeal Swab     Status: None   Collection Time: 01/17/20  6:04 PM   Specimen: Nasopharyngeal Swab  Result Value Ref Range Status   SARS Coronavirus 2 NEGATIVE NEGATIVE Final    Comment: (NOTE) SARS-CoV-2 target nucleic acids are NOT DETECTED. The SARS-CoV-2 RNA is generally detectable in upper and lower respiratory specimens during the acute phase of infection. Negative results do not preclude SARS-CoV-2 infection, do not rule out co-infections with other pathogens, and should not  be used as the sole basis for treatment or other patient management decisions. Negative results must be combined with clinical observations, patient history, and epidemiological information. The expected result is Negative. Fact Sheet for Patients: SugarRoll.be Fact Sheet for Healthcare Providers: https://www.woods-.com/ This test is not yet approved or cleared by the [place] FDA and  has been authorized for detection and/or diagnosis of SARS-CoV-2 by FDA under an Emergency Use Authorization (EUA). This EUA will remain  in effect (meaning this test can be used) for the duration of the COVID-19 declaration under Section 56 4(b)(1) of the Act, 21 U.S.C. section 360bbb-3(b)(1), unless the authorization is terminated or revoked sooner. Performed at [facility], [place] [address] [place], [place] [address]   SARS CORONAVIRUS 2 (TAT 6-24 HRS) Nasopharyngeal Nasopharyngeal Swab     Status: None   Collection Time: 01/20/20  3:55 PM   Specimen: Nasopharyngeal Swab  Result Value Ref Range Status   SARS Coronavirus 2 NEGATIVE NEGATIVE Final    Comment: (NOTE) SARS-CoV-2 target nucleic acids are NOT DETECTED. The SARS-CoV-2 RNA is generally detectable in upper and lower respiratory specimens during the acute phase of infection. Negative results do not preclude SARS-CoV-2 infection, do not rule out co-infections with other pathogens, and should not be used as the sole basis for treatment or other patient management decisions. Negative results must be combined with clinical observations, patient history, and epidemiological information. The expected result is Negative. Fact Sheet for Patients: SugarRoll.be Fact Sheet for Healthcare Providers: https://www.woods-.com/ This test is not yet approved or cleared by the [place] FDA and  has been authorized for detection and/or diagnosis of  SARS-CoV-2 by FDA under an Emergency Use Authorization (EUA). This EUA will remain  in effect (meaning this test can be used) for the duration of the COVID-19 declaration under Section 56 4(b)(1) of the Act, 21 U.S.C. section 360bbb-3(b)(1), unless the authorization is terminated or revoked sooner. Performed at [facility], [place] [address], [place], [place] [address]      Labs: BNP (last 3 results) No results for input(s): BNP in the last 8760 hours. Basic Metabolic Panel: Recent Labs  Lab 01/17/20 1349 01/18/20 0443  NA 137 137  K 3.6 3.6  CL 100 105  CO2 26 24  GLUCOSE 103* 112*  BUN 27* 21  CREATININE 0.88 0.71  CALCIUM 9.3 8.8*   Liver Function Tests: Recent Labs  Lab 01/17/20 1349  AST 17  ALT 15  ALKPHOS 111  BILITOT 0.8  PROT 7.8  ALBUMIN 4.1   No results for input(s): LIPASE, AMYLASE in the last 168 hours. No results for input(s): AMMONIA in the last 168 hours. CBC: Recent Labs  Lab 01/17/20 1349 01/18/20 0443 01/21/20 0426  WBC 6.8 7.2 5.1  NEUTROABS 4.9  --   --   HGB 14.0 12.8 12.9  HCT 43.8 40.3 40.4  MCV 92.6 94.4 92.2  PLT 250 235 276   Cardiac Enzymes: No results for input(s): CKTOTAL, CKMB, CKMBINDEX, TROPONINI in the last 168 hours. BNP: Invalid input(s): POCBNP CBG: No results for input(s): GLUCAP in the last 168 hours. D-Dimer No results for input(s): DDIMER in the last 72 hours. Hgb A1c No results for input(s): HGBA1C in the last 72 hours. Lipid Profile No results for input(s): CHOL, HDL, LDLCALC, TRIG, CHOLHDL, LDLDIRECT in the last 72 hours. Thyroid function studies No results for input(s): TSH, T4TOTAL, T3FREE, THYROIDAB in the last 72 hours.  Invalid input(s): FREET3 Anemia work up No results for input(s): VITAMINB12, FOLATE, FERRITIN, TIBC, IRON, RETICCTPCT in the last 72 hours. Urinalysis    Component Value Date/Time   COLORURINE YELLOW 01/17/2020 1256   APPEARANCEUR CLEAR 01/17/2020 1256   LABSPEC 1.023  01/17/2020 1256   PHURINE 5.0 01/17/2020 1256   GLUCOSEU NEGATIVE 01/17/2020 1256   HGBUR SMALL (A) 01/17/2020 1256   BILIRUBINUR NEGATIVE 01/17/2020 1256   BILIRUBINUR neg 10/02/2019 1557   KETONESUR 20 (A) 01/17/2020 1256   PROTEINUR NEGATIVE 01/17/2020 1256   UROBILINOGEN 0.2 10/02/2019 1557   NITRITE NEGATIVE 01/17/2020 1256   LEUKOCYTESUR NEGATIVE 01/17/2020 1256   Sepsis Labs Invalid input(s): PROCALCITONIN,  WBC,  LACTICIDVEN Microbiology Recent Results (from the past 240 hour(s))  SARS CORONAVIRUS 2 (TAT 6-24 HRS) Nasopharyngeal Nasopharyngeal Swab     Status: None   Collection Time: 01/17/20  6:04 PM   Specimen: Nasopharyngeal Swab  Result Value Ref Range Status   SARS Coronavirus 2 NEGATIVE NEGATIVE Final    Comment: (NOTE) SARS-CoV-2 target nucleic acids are NOT DETECTED. The SARS-CoV-2 RNA is generally detectable in upper and lower respiratory specimens during the acute phase of infection. Negative results do not preclude SARS-CoV-2 infection, do not rule out co-infections with other pathogens, and should not be used as the sole basis for treatment or other patient management decisions. Negative results must be combined with clinical observations, patient history, and epidemiological information. The expected result is Negative. Fact Sheet for Patients: SugarRoll.be Fact Sheet for Healthcare Providers: https://www.woods-.com/ This test is not yet approved or cleared by the [place] FDA and  has been authorized for detection and/or diagnosis of SARS-CoV-2 by FDA under an Emergency Use Authorization (EUA). This EUA will remain  in effect (meaning this test can be used) for the duration of the COVID-19 declaration under Section 56 4(b)(1) of the Act, 21 U.S.C. section 360bbb-3(b)(1), unless the authorization is terminated or revoked sooner. Performed at [facility], [place] [address], [place],  [place] [address]   SARS CORONAVIRUS 2 (TAT 6-24 HRS) Nasopharyngeal Nasopharyngeal Swab     Status: None   Collection Time: 01/20/20  3:55 PM   Specimen: Nasopharyngeal Swab  Result Value Ref Range Status   SARS Coronavirus 2 NEGATIVE NEGATIVE Final    Comment: (NOTE) SARS-CoV-2 target nucleic acids are NOT DETECTED. The SARS-CoV-2 RNA is generally detectable in upper and lower respiratory specimens during the acute phase of infection. Negative results do not preclude SARS-CoV-2 infection, do not rule out co-infections with other pathogens, and should not be used as the sole basis for treatment or other patient management decisions. Negative results must be combined with clinical observations, patient history, and epidemiological information. The expected result is Negative. Fact Sheet for Patients: SugarRoll.be Fact Sheet for Healthcare Providers: https://www.woods-.com/ This test is not yet approved or cleared by the [place] FDA and  has been  authorized for detection and/or diagnosis of SARS-CoV-2 by FDA under an Emergency Use Authorization (EUA). This EUA will remain  in effect (meaning this test can be used) for the duration of the COVID-19 declaration under Section 56 4(b)(1) of the Act, 21 U.S.C. section 360bbb-3(b)(1), unless the authorization is terminated or revoked sooner. Performed at [facility], [place] [address], [place], [place] [address]      Time coordinating discharge: 39 minutes  SIGNED:   Georgette Shell, MD  [facility] 01/22/2020, 9:47 AM Pager   If 7PM-7AM, please contact night-coverage [email or web address] Password [email or web address]

## 2020-01-22 NOTE — Progress Notes (Signed)
Discharge instructions gone over with daughter [name] all questions were answered.

## 2020-01-22 NOTE — Discharge Instructions (Signed)
Lumbar Spine Fracture A lumbar spine fracture is a break in one of the bones of the lower back. Lumbar spine fractures can vary from mild to severe. The most severe types are those that:  Cause the broken bones to move out of place (unstable).  Injure or press on the spinal cord. During recovery, it is normal to have pain and stiffness in the lower back for weeks. What are the causes? This condition may be caused by:  A fall.  A car accident.  A gunshot wound.  A hard, direct hit to the back. What increases the risk? You are more likely to develop this condition if:  You are in a situation that could result in a fall or other violent injury.  You have a condition that causes weakness in the bones (osteoporosis). What are the signs or symptoms? The main symptom of this condition is severe pain in the lower back. If a fracture is complex or severe, there may also be:  A misshapen or swollen area on the lower back.  Limited ability to move an area of the lower back.  Inability to empty the bladder (urinary retention).  Loss of bowel or bladder control (incontinence).  Loss of strength or sensation in the legs, feet, and toes.  Inability to move (paralysis). How is this diagnosed? This condition is diagnosed based on:  A physical exam.  Symptoms and what happened just before they developed.  The results of imaging tests, such as an X-ray, CT scan, or MRI. If your nerves have been damaged, you may also have other tests to find out the extent of the damage. How is this treated? Treatment for this condition depends on how severe the injury is. Most fractures can be treated with:  A back brace.  Bed rest and activity restrictions.  Pain medicine.  Physical therapy. Fractures that are complex, involve multiple bones, or make the spine unstable may require surgery. Surgery is done:  To remove pressure from the nerves or spinal cord.  To stabilize the broken pieces of  bone. Follow these instructions at home: Medicines  Take over-the-counter and prescription medicines only as told by your health care provider.  Do not drive or use heavy machinery while taking prescription pain medicine.  If you are taking prescription pain medicine, take actions to prevent or treat constipation. Your health care provider may recommend that you: ? Drink enough fluid to keep your urine pale yellow. ? Eat foods that are high in fiber, such as fresh fruits and vegetables, whole grains, and beans. ? Limit foods that are high in fat and processed sugars, such as fried or sweet foods. ? Take an over-the-counter or prescription medicine for constipation. If you have a brace:  Wear the back brace as told by your health care provider. Remove it only as told by your health care provider.  Keep the brace clean.  If the brace is not waterproof: ? Do not let it get wet. ? Cover it with a watertight covering when you take a bath or a shower. Activity  Stay in bed (on bed rest) only as directed by your health care provider.  Do exercises to improve motion and strength in your back (physical therapy), if your health care provider tells you to do so.  Return to your normal activities as directed by your health care provider. Ask your health care provider what activities are safe for you. Managing pain, stiffness, and swelling   If directed, put ice  on the injured area: ? If you have a removable brace, remove it as told by your health care provider. ? Put ice in a plastic bag. ? Place a towel between your skin and the bag. ? Leave the ice on for 20 minutes, 2-3 times a day. General instructions  Do not use any products that contain nicotine or tobacco, such as cigarettes and e-cigarettes. These can delay healing after injury. If you need help quitting, ask your health care provider.  Do not drink alcohol. Alcohol can interfere with your treatment.  Keep all follow-up visits  as directed by your health care provider. This is important. ? Failing to follow up as recommended could result in permanent injury, disability, or long-lasting (chronic) pain. Contact a health care provider if:  You have a fever.  Your pain medicine is not helping.  Your pain does not get better over time.  You cannot return to your normal activities as planned or expected. Get help right away if:  You have difficulty breathing.  Your pain is very bad and it suddenly gets worse.  You have numbness, tingling, or weakness in any part of your body.  You are unable to empty your bladder.  You cannot control your bladder or bowels.  You are unable to move any body part (paralysis) that is below the level of your injury.  You vomit.  You have pain in your abdomen. Summary  A lumbar spine fracture is a break in one of the bones of the lower back.  The main symptom of this condition is severe pain in the lower back. If a fracture is complex, there may also be numbness, tingling, or paralysis in the legs.  Treatment depends on how severe the injury is. Most fractures can be treated with a back brace, bed rest and activity restrictions, pain medicine, and physical therapy.  Fractures that are complex, involve multiple bones, or make the spine unstable may require surgery. This information is not intended to replace advice given to you by your health care provider. Make sure you discuss any questions you have with your health care provider. Document Revised: 12/14/2017 Document Reviewed: 12/14/2017 Elsevier Patient Education  [place].   Managing Pain Without Opioids Opioids are strong medicines used to treat moderate to severe pain. For some people, especially those who have long-term (chronic) pain, opioids may not be the best choice for pain management due to:  Side effects like nausea, constipation, and sleepiness.  The risk of addiction (opioid use disorder). The  longer you take opioids, the greater your risk of addiction. Pain that lasts for more than 3 months is called chronic pain. Managing chronic pain usually requires more than one approach and is often provided by a team of health care providers working together (multidisciplinary approach). Pain management may be done at a pain management center or pain clinic. Types of pain management without opioids Managing pain without opioids can involve:  Non-opioid medicines.  Exercises to help relieve pain and improve strength and range of motion (physical therapy).  Therapy to help with everyday tasks and activities (occupational therapy).  Therapy to help you find ways to relieve pain by doing things you enjoy (recreational therapy).  Talk therapy (psychotherapy) and other mental health therapies.  Medical treatments such as injections or devices.  Making lifestyle changes. Pain management options Non-opioid medicines Non-opioid medicines for pain may include medicines taken by mouth (oral medicines), such as:  Over-the-counter or prescription NSAIDs. These may be  the first medicines used for pain. They work well for muscle and bone pain, and they reduce swelling.  Acetaminophen. This over-the-counter medicine may work well for milder pain but not swelling.  Antidepressants. These may be used to treat chronic pain. A certain type of antidepressant (tricyclics) is often used. These medicines are given in lower doses for pain than when used for depression.  Anticonvulsants. These are usually used to treat seizures but may also reduce nerve (neuropathic) pain.  Muscle relaxants. These relieve pain caused by sudden muscle tightening (spasms). You may also use a type of pain medicine that is applied to the skin as a patch, cream, or gel (topical analgesic), such as a numbing medicine. These may cause fewer side effects than oral medicines. Therapy Physical therapy involves doing exercises to gain  strength and flexibility. A physical therapist may teach you exercises to move and stretch parts of your body that are weak, stiff, or painful. You can learn these exercises at physical therapy visits and practice them at home. Physical therapy may also involve:  Massage.  Heat wraps or applying heat or cold to affected areas.  Sending electrical signals through the skin to interrupt pain signals (transcutaneous electrical nerve stimulation, TENS).  Sending weak lasers through the skin to reduce pain and swelling (low-level laser therapy).  Using signals from your body to help you learn to regulate pain (biofeedback). Occupational therapy helps you learn ways to function at home and work with less pain. Recreational therapy may involve trying new activities or hobbies, such as drawing or a physical activity. Types of mental health therapy for pain include:  Cognitive behavioral therapy (CBT) to help you learn coping skills for dealing with pain.  Acceptance and commitment therapy (ACT) to change the way you think and react to pain.  Relaxation therapies, including muscle relaxation exercises and focusing your mind on the present moment to lower stress (mindfulness-based stress reduction).  Pain management counseling. This may be individual, family, or group counseling.  Medical treatments Medical treatments for pain management include:  Nerve block injections. These may include a pain blocker and anti-inflammatory medicines. You may have injections: ? Near the spine to relieve chronic back or neck pain. ? Into joints to relieve back or joint pain. ? Into nerve areas that supply a painful area to relieve body pain. ? Into muscles (trigger point injections) to relieve some painful muscle conditions.  A medical device placed near your spine to help block pain signals and relieve nerve pain or chronic back pain (spinal cord stimulation device).  Acupuncture. Follow these instructions  at home Medicines  Take over-the-counter and prescription medicines only as told by your health care provider.  If you are taking pain medicine, ask your health care providers about possible side effects to watch out for.  Do not drive or use heavy machinery while taking prescription pain medicine. Lifestyle   Do not use drugs or alcohol to reduce pain. Limit alcohol intake to no more than 1 drink a day for nonpregnant women and 2 drinks a day for men. One drink equals 12 oz of beer, 5 oz of wine, or 1 oz of hard liquor.  Do not use any products that contain nicotine or tobacco, such as cigarettes and e-cigarettes. These can delay healing. If you need help quitting, ask your health care provider.  Eat a healthy diet and maintain a healthy weight. Poor diet and excess weight may make pain worse. ? Eat foods that are high  in fiber. These include fresh fruits and vegetables, whole grains, and beans. ? Limit foods that are high in fat and processed sugars, such as fried and sweet foods.  Exercise regularly. Exercise lowers stress and may help relieve pain. ? Ask your health care provider what activities and exercises are safe for you. ? If your health care provider approves, join an exercise class that combines movement and stress reduction. Examples include yoga and tai chi.  Get enough sleep. Lack of sleep may make pain worse.  Lower stress as much as possible. Practice stress reduction techniques as told by your therapist. General instructions  Work with all your pain management providers to find the treatments that work best for you. You are an important member of your pain management team. There are many things you can do to reduce pain on your own.  Consider joining an online or in-person support group for people who have chronic pain.  Keep all follow-up visits as told by your health care providers. This is important. Where to find more information You can find more information  about managing pain without opioids from:  American Academy of Pain Medicine: painmed.[place] for Chronic Pain: instituteforchronicpain.org  American Chronic Pain Association: theacpa.org Contact a health care provider if:  You have side effects from pain medicine.  Your pain gets worse or does not get better with treatments or home care.  You are struggling with anxiety or depression. Summary  Many types of pain can be managed without opioids. Chronic pain may respond better to pain management without opioids.  Pain is best managed with a team of providers working together.  Pain management without opioids may include non-opioid medicines, medical treatments, physical therapy, mental health therapy, and lifestyle changes.  Tell your health care providers if your pain gets worse or is not being managed well enough. This information is not intended to replace advice given to you by your health care provider. Make sure you discuss any questions you have with your health care provider. Document Revised: 07/22/2019 Document Reviewed: 08/20/2017 Elsevier Patient Education  [place].

## 2020-01-22 NOTE — Progress Notes (Signed)
Nutrition Follow-up  DOCUMENTATION CODES:   Not applicable  INTERVENTION:  - continue Boost Breeze BID and Ensure Enlive BID.  - continue to encourage PO intakes.    NUTRITION DIAGNOSIS:   Increased nutrient needs related to acute illness as evidenced by estimated needs. -ongoing  GOAL:   Patient will meet greater than or equal to 90% of their needs -minimally met  MONITOR:   PO intake, Supplement acceptance, Labs, Weight trends  ASSESSMENT:   86 y.o. female with medical history significant for HTN, diverticulitis, AVR with mechanical valve on Coumadin, and hemorrhagic stroke. Patient presented to the ED due to worsening back pain and found to have an L3 compression fracture.  She has not been weighed since admission on 3/8. Patient has accepted Boost Breeze ~75% of the time offered and Ensure ~50% of the time offered. She recently consumed the following at meals:  3/10- 75% of breakfast, 50% of lunch, and 0% of dinner 3/11- 50% of breakfast, 75% of lunch, and 25% of dinner 3/12- 100% of breakfast   Discharge order and discharge summary for discharge home entered earlier this AM.     Labs reviewed.  Medications reviewed; 100 mg colace BID, 20 mg oral lasix/day, daily multivitamin with minerals.   Diet Order:   Diet Order            Diet - low sodium heart healthy        Diet - low sodium heart healthy        Diet Heart Room service appropriate? Yes; Fluid consistency: Thin  Diet effective now              EDUCATION NEEDS:   No education needs have been identified at this time  Skin:  Skin Assessment: Reviewed RN Assessment  Last BM:  3/12  Height:   Ht Readings from Last 1 Encounters:  01/18/20 '5\' 7"'  (1.702 m)    Weight:   Wt Readings from Last 1 Encounters:  01/18/20 59.1 kg    Ideal Body Weight:  61.4 kg  BMI:  Body mass index is 20.41 kg/m.  Estimated Nutritional Needs:   Kcal:  1500-1700 kcal  Protein:  70-80 grams  Fluid:  >/=  1.8 L/day     Jarome Matin, MS, RD, LDN, CNSC Inpatient Clinical Dietitian RD pager # available in [facility]  After hours/weekend pager # available in [facility]

## 2020-01-22 NOTE — TOC Transition Note (Addendum)
Transition of Care [facility] - CM/SW Discharge Note   Patient Details  Name: Virginia Franklin MRN: [id number] Date of Birth: [birth year]  Transition of Care [facility]) CM/SW Contact:  Lia Hopping, [place] Phone Number: 01/22/2020, 10:47 AM   Clinical Narrative:    Patient daughter spoke to the physician and prefers the patient return to her Independent living with am and pm nursing support which daughter has arranged.  CSW reached out to liaison [place] and faxed [place] orders. [facility] to be arranged.   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 RW ordered through [facility].    Final next level of care: Skilled Nursing Facility Barriers to Discharge: Barriers Resolved   Patient Goals and CMS Choice Patient states their goals for this hospitalization and ongoing recovery are:: " Per daughter, "walk again with support of walker" CMS Medicare.gov Compare Post Acute Care list provided to:: Patient Represenative (must comment) Choice offered to / list presented to : Adult Children  Discharge Placement                Patient to be transferred to facility by: Daughter Name of family member notified: [name] Patient and family notified of of transfer: 01/22/20  Discharge Plan and Services In-house Referral: Clinical Social Work   Post Acute Care Choice: [place]              Expected Discharge Date: 01/22/20               DME Arranged: Gilford Rile rolling DME Agency: [employer] Date DME Agency Contacted: 01/22/20 Time DME Agency Contacted: [id number]         [place]: Other - See comment([facility] [place]) Date [place]: 01/22/20 Time [place]: 13 Representative spoke with at [place]: [place] ([place]) Interventions     Readmission Risk Interventions No flowsheet data found.

## 2020-01-25 ENCOUNTER — Other Ambulatory Visit: Payer: Self-pay | Admitting: *Deleted

## 2020-01-25 NOTE — Patient Outreach (Signed)
[facility]) Care Management  01/25/2020  Virginia Franklin Virginia Franklin, Virginia Franklin [id number]   RED ON EMMI ALERT - General Discharge Day # 1 Date: 3/14 Red Alert Reason: unfilled prescriptions   Outreach attempt #1, successful to daughter, member's identity verified.  This care manager introduced self and stated purpose of call.  [facility] care management services explained.  She report member lives in assisted living facility and has all of her needs coordinated between herself and the facility.  State she did not have the prescriptions filled because they were over the counter medications for PRN constipation.  Member reportedly as her own regime that she uses for relief.    Daughter denies that member has additional needs.  She has a personal care nurse that comes daily in the morning and the evenings, also has PT and OT several times a week.  Has other needs taken care of at the facility.   Plan: RN CM will not open case at this time as no further needs identified.  Valente David, [place], MSN [place] [phone]

## 2020-01-28 ENCOUNTER — Telehealth: Payer: Self-pay | Admitting: Pharmacist

## 2020-01-28 NOTE — Telephone Encounter (Signed)
Received call from Brooks Sailors with [facility] [phone] inquiring about patient's warfarin regimen. She was recently discharged from the hospital and they are filling her pill box. Provided him with most recent dosing instruction of 7.5mg  daily except 3.75mg  on Sundays, Tuesdays, and Thursdays. Confirmed that pt will keep her 3/31 appt with [place] for her next INR check.

## 2020-01-29 ENCOUNTER — Emergency Department (HOSPITAL_COMMUNITY)
Admission: EM | Admit: 2020-01-29 | Discharge: 2020-01-29 | Disposition: A | Discharge status: Home / Self Care | Payer: Medicare HMO | Attending: Emergency Medicine | Admitting: Emergency Medicine

## 2020-01-29 ENCOUNTER — Other Ambulatory Visit: Payer: Self-pay

## 2020-01-29 DIAGNOSIS — Z87891 Personal history of nicotine dependence: Secondary | ICD-10-CM | POA: Insufficient documentation

## 2020-01-29 DIAGNOSIS — Z8541 Personal history of malignant neoplasm of cervix uteri: Secondary | ICD-10-CM | POA: Diagnosis not present

## 2020-01-29 DIAGNOSIS — R200 Anesthesia of skin: Secondary | ICD-10-CM | POA: Insufficient documentation

## 2020-01-29 DIAGNOSIS — F0280 Dementia in other diseases classified elsewhere without behavioral disturbance: Secondary | ICD-10-CM | POA: Insufficient documentation

## 2020-01-29 DIAGNOSIS — R202 Paresthesia of skin: Secondary | ICD-10-CM | POA: Diagnosis present

## 2020-01-29 DIAGNOSIS — Z79899 Other long term (current) drug therapy: Secondary | ICD-10-CM | POA: Insufficient documentation

## 2020-01-29 DIAGNOSIS — G309 Alzheimer's disease, unspecified: Secondary | ICD-10-CM | POA: Diagnosis not present

## 2020-01-29 DIAGNOSIS — I10 Essential (primary) hypertension: Secondary | ICD-10-CM | POA: Insufficient documentation

## 2020-01-29 LAB — CBC WITH DIFFERENTIAL/PLATELET
Abs Immature Granulocytes: 0.03 10*3/uL (ref 0.00–0.07)
Basophils Absolute: 0 10*3/uL (ref 0.0–0.1)
Basophils Relative: 0 %
Eosinophils Absolute: 0.1 10*3/uL (ref 0.0–0.5)
Eosinophils Relative: 1 %
HCT: 40.6 % (ref 36.0–46.0)
Hemoglobin: 13.4 g/dL (ref 12.0–15.0)
Immature Granulocytes: 1 %
Lymphocytes Relative: 21 %
Lymphs Abs: 1.4 10*3/uL (ref 0.7–4.0)
MCH: 29.4 pg (ref 26.0–34.0)
MCHC: 33 g/dL (ref 30.0–36.0)
MCV: 89 fL (ref 80.0–100.0)
Monocytes Absolute: 0.6 10*3/uL (ref 0.1–1.0)
Monocytes Relative: 9 %
Neutro Abs: 4.3 10*3/uL (ref 1.7–7.7)
Neutrophils Relative %: 68 %
Platelets: 287 10*3/uL (ref 150–400)
RBC: 4.56 MIL/uL (ref 3.87–5.11)
RDW: 12.7 % (ref 11.5–15.5)
WBC: 6.3 10*3/uL (ref 4.0–10.5)
nRBC: 0 % (ref 0.0–0.2)

## 2020-01-29 LAB — BASIC METABOLIC PANEL
Anion gap: 11 (ref 5–15)
BUN: 13 mg/dL (ref 8–23)
CO2: 23 mmol/L (ref 22–32)
Calcium: 9.4 mg/dL (ref 8.9–10.3)
Chloride: 103 mmol/L (ref 98–111)
Creatinine, Ser: 0.76 mg/dL (ref 0.44–1.00)
GFR calc Af Amer: >60 mL/min (ref >60)
GFR calc non Af Amer: >60 mL/min (ref >60)
Glucose, Bld: 102 mg/dL — ABNORMAL HIGH (ref 70–99)
Potassium: 3.7 mmol/L (ref 3.5–5.1)
Sodium: 137 mmol/L (ref 135–145)

## 2020-01-29 LAB — CBG MONITORING, ED: Glucose-Capillary: 94 mg/dL (ref 70–99)

## 2020-01-29 LAB — PROTIME-INR
INR: 2.5 — ABNORMAL HIGH (ref 0.8–1.2)
Prothrombin Time: 27.1 seconds — ABNORMAL HIGH (ref 11.4–15.2)

## 2020-01-29 NOTE — ED Provider Notes (Signed)
[facility]ER[facility] DEPARTMENT Provider Note   CSN: [id number] Arrival date & time: 01/29/20  1356     History No chief complaint on file.   Virginia Franklin is a 86 y.o. female.  Patient is an 86 year old female with past medical history of recently diagnosed L3 compression fracture, cervical cancer, osteoporosis, CVA, and hypertension.  She presents today with complaints of numbness and tingling to all toes of both feet.  This began this morning.  It has been occurring intermittently throughout the day.  She notified the staff of her assisted living facility and EMS was called and patient transported here.  Patient was seen recently for a compression fracture in her back.  She denies any new injury or trauma and does not feel as though the pain is significantly worse than when she was previously seen.  She denies any bowel or bladder complaints.  The history is provided by the patient.       Past Medical History:  Diagnosis Date  . Body mass index (BMI) of 22.0-22.9 in adult   . Cervical cancer ([place])   . H/O cervical fracture   . H/O osteoporosis   . H/O rheumatoid arthritis   . H/O: CVA (cerebrovascular accident) 2012  . H/O: osteoarthritis   . History of urinary frequency   . Hx of diverticulitis of colon   . Hypertension     Patient Active Problem List   Diagnosis Date Noted  . Compression fracture of L3 lumbar vertebra, closed, initial encounter ([place]) 01/17/2020  . Acute spont intraparenchymal hemorrhage assoc w/ coagulopathy ([facility]) L lateral thalamus and PLIC on warfarin [id number]  . Diverticulitis 03/29/2019  . Abdominal pain 03/29/2019  . Supratherapeutic INR 03/29/2019  . Kidney mass 03/29/2019  . Hyponatremia 03/29/2019  . Nausea 03/29/2019  . Constipation 03/29/2019  . Alzheimer's dementia ([place]) 01/05/2019  . Depression with anxiety 07/25/2018  . Altered mental status 12/16/2017  . Spells of speech arrest   . HTN (hypertension)  10/15/2017  . Dyslipidemia 10/15/2017  . Hx of completed stroke 10/15/2017  . Osteoarthritis 10/15/2017  . Encounter for therapeutic drug monitoring 09/13/2017  . Chronic anticoagulation 09/05/2017  . Aortic valve replaced 09/05/2017    Past Surgical History:  Procedure Laterality Date  . ABDOMINAL HYSTERECTOMY    . CARDIAC SURGERY    . CATARACT EXTRACTION    . SMALL INTESTINE SURGERY       OB History   No obstetric history on file.     Family History  Problem Relation Age of Onset  . Kidney disease Mother        MALIGNANT NEOPLASM  . Arthritis Father   . Heart Problems Father   . Lung disease Brother   . Heart disease Neg Hx     Social History   Tobacco Use  . Smoking status: Former Research scientist (life sciences)  . Smokeless tobacco: Never Used  Substance Use Topics  . Alcohol use: Yes    Comment: SOCIALLY  . Drug use: No    Home Medications Prior to Admission medications   Medication Sig Start Date End Date Taking? Authorizing Provider  docusate sodium (COLACE) 100 MG capsule Take 1 capsule (100 mg total) by mouth 2 (two) times daily. 01/22/20   Georgette Shell, MD  escitalopram (LEXAPRO) 5 MG tablet TAKE 1 TABLET DAILY 01/04/20   [name], MD  ezetimibe-simvastatin (VYTORIN) 10-40 MG tablet Take 1 tablet by mouth daily.    [provider]  furosemide (LASIX)  20 MG tablet TAKE 1 TABLET DAILY 04/08/19   [name], MD  lisinopril (ZESTRIL) 10 MG tablet Take 1 tablet (10 mg total) by mouth daily. 10/02/19   [name], MD  memantine (NAMENDA) 10 MG tablet Take 10 mg by mouth 2 (two) times daily.    [provider]  polyethylene glycol (MIRALAX / GLYCOLAX) 17 g packet Take 17 g by mouth daily as needed for mild constipation. 01/22/20   Georgette Shell, MD  solifenacin (VESICARE) 5 MG tablet Take 5 mg by mouth daily.    [provider]  traMADol (ULTRAM) 50 MG tablet Take 2 tablets (100 mg total) by mouth every 6 (six) hours as needed for  moderate pain. Patient taking differently: Take 100 mg by mouth every 12 (twelve) hours as needed for moderate pain.  10/20/19   [name], MD  warfarin (COUMADIN) 7.5 MG tablet Take 0.5-1 tablets (3.75-7.5 mg total) by mouth See admin instructions. At d/c from hospital 11/2 - take 7.5 daily at 6p. Next lab draw 11/4. Adjust dose per coumadin clinic Patient taking differently: Take 3.75-7.5 mg by mouth See admin instructions. Take 1/2 tablet (3.75 mg) Every Sun, Tues, Thurs & Take 1 tablet (7.5 mg) all other days.  At d/c from hospital 11/2 - take 7.5 daily at 6p. Next lab draw 11/4. Adjust dose per coumadin clinic 09/14/19   Donzetta Starch, NP    Allergies    Penicillins  Review of Systems   Review of Systems  All other systems reviewed and are negative.   Physical Exam Updated Vital Signs BP (!) 147/95 (BP Location: Right Arm)   Pulse 69   Temp 98.4 F (36.9 C) (Oral)   Resp 18   Ht 5\' 7"  (1.702 m)   Wt 59.1 kg   SpO2 98%   BMI 20.41 kg/m   Physical Exam Vitals and nursing note reviewed.  Constitutional:      General: She is not in acute distress.    Appearance: She is well-developed. She is not diaphoretic.  HENT:     Head: Normocephalic and atraumatic.  Cardiovascular:     Rate and Rhythm: Normal rate and regular rhythm.     Heart sounds: No murmur. No friction rub. No gallop.   Pulmonary:     Effort: Pulmonary effort is normal. No respiratory distress.     Breath sounds: Normal breath sounds. No wheezing.  Abdominal:     General: Bowel sounds are normal. There is no distension.     Palpations: Abdomen is soft.     Tenderness: There is no abdominal tenderness.  Musculoskeletal:        General: Normal range of motion.     Cervical back: Normal range of motion and neck supple.  Skin:    General: Skin is warm and dry.  Neurological:     General: No focal deficit present.     Mental Status: She is alert and oriented to person, place, and time.     Comments:  Strength is 5 out of 5 in both lower extremities.  She is able to flex and extend her toes without difficulty.  DTRs are 1+ and symmetrical in the patellar and Achilles tendons.     ED Results / Procedures / Treatments   Labs (all labs ordered are listed, but only abnormal results are displayed) Labs Reviewed  BASIC METABOLIC PANEL  CBC WITH DIFFERENTIAL/PLATELET    EKG None  Radiology No results found.  Procedures Procedures (including critical care time)  Medications Ordered in ED Medications - No data to display  ED Course  I have reviewed the triage vital signs and the nursing notes.  Pertinent labs & imaging results that were available during my care of the patient were reviewed by me and considered in my medical decision making (see chart for details).    MDM Rules/Calculators/A&P  Patient is an 86 year old female with medical history as described in the HPI presenting with complaints of numb toes.  Patient's neurologic exam is intact.  Her reflexes, strength, and sensation are all symmetrical in both lower extremities.  She was recently seen for a compression fracture of her lumbar spine, however she has had no new injury or trauma and does not describe any worsening of her pain.  I suspect the numbness in her toes is some sort of neuropathy.  As this began this morning, I feel as though observation for several days is appropriate.  Patient seems appropriate for discharge.  She is to follow-up with her primary doctor next week if symptoms or not improving.  I highly doubt any complication from her compression fracture and do not feel as though any further imaging is indicated.  Final Clinical Impression(s) / ED Diagnoses Final diagnoses:  None    Rx / DC Orders ED Discharge Orders    None       Veryl Speak, MD 01/29/20 1533

## 2020-01-29 NOTE — ED Triage Notes (Signed)
Pt here for evaluation of tingling in R leg since 1100 this morning and "not feeling right" for several days. Hx dementia, A/O x 2, mentation at baseline. Pt on Warfarin, hx of stroke within the last two years. No other neuro deficits. Reports pain with and frequency of urination x several weeks.

## 2020-01-29 NOTE — Discharge Instructions (Addendum)
Continue medications as previously prescribed.  Follow-up with your primary doctor next week if your symptoms or not improving, and return to the ER if symptoms significantly worsen or change.

## 2020-01-29 NOTE — ED Notes (Signed)
On exam, patient endorses tingling in bilateral feet, not just one leg, onset this morning.

## 2020-02-01 ENCOUNTER — Telehealth: Payer: Self-pay

## 2020-02-01 NOTE — Telephone Encounter (Signed)
Voicemail message left at 9:35a:  Pts daughter called to reschedule 02/10/20 appt, please call

## 2020-02-01 NOTE — Telephone Encounter (Signed)
Pt has been r/s  

## 2020-02-02 ENCOUNTER — Other Ambulatory Visit: Payer: Self-pay | Admitting: Interventional Cardiology

## 2020-02-10 ENCOUNTER — Ambulatory Visit: Payer: Medicare HMO | Admitting: Adult Health

## 2020-02-10 ENCOUNTER — Other Ambulatory Visit: Payer: Self-pay

## 2020-02-10 ENCOUNTER — Ambulatory Visit (INDEPENDENT_AMBULATORY_CARE_PROVIDER_SITE_OTHER): Payer: Medicare HMO | Admitting: *Deleted

## 2020-02-10 DIAGNOSIS — Z7901 Long term (current) use of anticoagulants: Secondary | ICD-10-CM

## 2020-02-10 DIAGNOSIS — Z5181 Encounter for therapeutic drug level monitoring: Secondary | ICD-10-CM | POA: Diagnosis not present

## 2020-02-10 DIAGNOSIS — Z952 Presence of prosthetic heart valve: Secondary | ICD-10-CM | POA: Diagnosis not present

## 2020-02-10 LAB — POCT INR: INR: 3.3 — AB (ref 2.0–3.0)

## 2020-02-10 NOTE — Patient Instructions (Signed)
Description   Take 1/2 a tablet today, then resume same dosage 1 tablet daily except for 1/2 tablet on Sundays, Tuesday and Thursdays. Eat an extra serving of greens today. Call Coumadin clinic with questions [phone].  Recheck INR in 3 weeks. [name] fills pt's pill box, [phone]

## 2020-02-10 NOTE — Progress Notes (Signed)
Called [facility] and gave him pt's warfarin dosing instructions.

## 2020-02-15 ENCOUNTER — Encounter: Payer: Self-pay | Admitting: Family Medicine

## 2020-02-16 NOTE — Telephone Encounter (Signed)
At this point just forget about the brace. She does not need to wear it

## 2020-03-02 ENCOUNTER — Ambulatory Visit (INDEPENDENT_AMBULATORY_CARE_PROVIDER_SITE_OTHER): Payer: Medicare HMO | Admitting: *Deleted

## 2020-03-02 ENCOUNTER — Ambulatory Visit (INDEPENDENT_AMBULATORY_CARE_PROVIDER_SITE_OTHER): Payer: Medicare HMO | Admitting: Podiatry

## 2020-03-02 ENCOUNTER — Encounter: Payer: Self-pay | Admitting: Podiatry

## 2020-03-02 ENCOUNTER — Other Ambulatory Visit: Payer: Self-pay

## 2020-03-02 DIAGNOSIS — Z5181 Encounter for therapeutic drug level monitoring: Secondary | ICD-10-CM

## 2020-03-02 DIAGNOSIS — Z952 Presence of prosthetic heart valve: Secondary | ICD-10-CM

## 2020-03-02 DIAGNOSIS — M79674 Pain in right toe(s): Secondary | ICD-10-CM

## 2020-03-02 DIAGNOSIS — L84 Corns and callosities: Secondary | ICD-10-CM

## 2020-03-02 DIAGNOSIS — M79675 Pain in left toe(s): Secondary | ICD-10-CM

## 2020-03-02 DIAGNOSIS — D689 Coagulation defect, unspecified: Secondary | ICD-10-CM

## 2020-03-02 DIAGNOSIS — B351 Tinea unguium: Secondary | ICD-10-CM | POA: Diagnosis not present

## 2020-03-02 DIAGNOSIS — Z7901 Long term (current) use of anticoagulants: Secondary | ICD-10-CM | POA: Diagnosis not present

## 2020-03-02 LAB — POCT INR: INR: 2.2 (ref 2.0–3.0)

## 2020-03-02 NOTE — Patient Instructions (Signed)
Corns and Calluses Corns are small areas of thickened skin that occur on the top, sides, or tip of a toe. They contain a cone-shaped core with a point that can press on a nerve below. This causes pain.  Calluses are areas of thickened skin that can occur anywhere on the body, including the hands, fingers, palms, soles of the feet, and heels. Calluses are usually larger than corns. What are the causes? Corns and calluses are caused by rubbing (friction) or pressure, such as from shoes that are too tight or do not fit properly. What increases the risk? Corns are more likely to develop in people who have misshapen toes (toe deformities), such as hammer toes. Calluses can occur with friction to any area of the skin. They are more likely to develop in people who:  Work with their hands.  Wear shoes that fit poorly, are too tight, or are high-heeled.  Have toe deformities. What are the signs or symptoms? Symptoms of a corn or callus include:  A hard growth on the skin.  Pain or tenderness under the skin.  Redness and swelling.  Increased discomfort while wearing tight-fitting shoes, if your feet are affected. If a corn or callus becomes infected, symptoms may include:  Redness and swelling that gets worse.  Pain.  Fluid, blood, or pus draining from the corn or callus. How is this diagnosed? Corns and calluses may be diagnosed based on your symptoms, your medical history, and a physical exam. How is this treated? Treatment for corns and calluses may include:  Removing the cause of the friction or pressure. This may involve: ? Changing your shoes. ? Wearing shoe inserts (orthotics) or other protective layers in your shoes, such as a corn pad. ? Wearing gloves.  Applying medicine to the skin (topical medicine) to help soften skin in the hardened, thickened areas.  Removing layers of dead skin with a file to reduce the size of the corn or callus.  Removing the corn or callus with a  scalpel or laser.  Taking antibiotic medicines, if your corn or callus is infected.  Having surgery, if a toe deformity is the cause. Follow these instructions at home:   Take over-the-counter and prescription medicines only as told by your health care provider.  If you were prescribed an antibiotic, take it as told by your health care provider. Do not stop taking it even if your condition starts to improve.  Wear shoes that fit well. Avoid wearing high-heeled shoes and shoes that are too tight or too loose.  Wear any padding, protective layers, gloves, or orthotics as told by your health care provider.  Soak your hands or feet and then use a file or pumice stone to soften your corn or callus. Do this as told by your health care provider.  Check your corn or callus every day for symptoms of infection. Contact a health care provider if you:  Notice that your symptoms do not improve with treatment.  Have redness or swelling that gets worse.  Notice that your corn or callus becomes painful.  Have fluid, blood, or pus coming from your corn or callus.  Have new symptoms. Summary  Corns are small areas of thickened skin that occur on the top, sides, or tip of a toe.  Calluses are areas of thickened skin that can occur anywhere on the body, including the hands, fingers, palms, and soles of the feet. Calluses are usually larger than corns.  Corns and calluses are caused by   rubbing (friction) or pressure, such as from shoes that are too tight or do not fit properly.  Treatment may include wearing any padding, protective layers, gloves, or orthotics as told by your health care provider. This information is not intended to replace advice given to you by your health care provider. Make sure you discuss any questions you have with your health care provider. Document Revised: 02/18/2019 Document Reviewed: 09/11/2017 Elsevier Patient Education  2020 [employer]  Onychomycosis/Fungal  Toenails  WHAT IS IT? An infection that lies within the keratin of your nail plate that is caused by a fungus.  WHY ME? Fungal infections affect all ages, sexes, races, and creeds.  There may be many factors that predispose you to a fungal infection such as age, coexisting medical conditions such as diabetes, or an autoimmune disease; stress, medications, fatigue, genetics, etc.  Bottom line: fungus thrives in a warm, moist environment and your shoes offer such a location.  IS IT CONTAGIOUS? Theoretically, yes.  You do not want to share shoes, nail clippers or files with someone who has fungal toenails.  Walking around barefoot in the same room or sleeping in the same bed is unlikely to transfer the organism.  It is important to realize, however, that fungus can spread easily from one nail to the next on the same foot.  HOW DO WE TREAT THIS?  There are several ways to treat this condition.  Treatment may depend on many factors such as age, medications, pregnancy, liver and kidney conditions, etc.  It is best to ask your doctor which options are available to you.  4. No treatment.   Unlike many other medical concerns, you can live with this condition.  However for many people this can be a painful condition and may lead to ingrown toenails or a bacterial infection.  It is recommended that you keep the nails cut short to help reduce the amount of fungal nail. 5. Topical treatment.  These range from herbal remedies to prescription strength nail lacquers.  About 40-50% effective, topicals require twice daily application for approximately 9 to 12 months or until an entirely new nail has grown out.  The most effective topicals are medical grade medications available through physicians offices. 6. Oral antifungal medications.  With an 80-90% cure rate, the most common oral medication requires 3 to 4 months of therapy and stays in your system for a year as the new nail grows out.  Oral antifungal medications do  require blood work to make sure it is a safe drug for you.  A liver function panel will be performed prior to starting the medication and after the first month of treatment.  It is important to have the blood work performed to avoid any harmful side effects.  In general, this medication safe but blood work is required. 7. Laser Therapy.  This treatment is performed by applying a specialized laser to the affected nail plate.  This therapy is noninvasive, fast, and non-painful.  It is not covered by insurance and is therefore, out of pocket.  The results have been very good with a 80-95% cure rate.  The [facility] is the only practice in the area to offer this therapy. 8. Permanent Nail Avulsion.  Removing the entire nail so that a new nail will not grow back. 

## 2020-03-02 NOTE — Patient Instructions (Addendum)
Description   Take 1.5 tablets today, then resume same dosage 1 tablet daily except for 1/2 tablet on Sundays, Tuesdays, and Thursdays. Call Coumadin clinic with questions [phone]. Recheck INR in 3 weeks. [name] fills pt's pill box, please call [phone]

## 2020-03-06 NOTE — Progress Notes (Signed)
Subjective: Virginia Franklin presents today for follow up of painful mycotic nails b/l that are difficult to trim. Pain interferes with ambulation. Aggravating factors include wearing enclosed shoe gear. Pain is relieved with periodic professional debridement and painful corn(s) b/l 3rd digits  which interfere(s) with ambulation. Aggravating factors include wearing enclosed shoe gear. Pain is relieved with periodic professional debridement.   Her daughter is present during the visit on today.   Allergies  Allergen Reactions  . Penicillins Anaphylaxis and Swelling    Has patient had a PCN reaction causing immediate rash, facial/tongue/throat swelling, SOB or lightheadedness with hypotension: Yes Has patient had a PCN reaction causing severe rash involving mucus membranes or skin necrosis: No Has patient had a PCN reaction that required hospitalization: No Has patient had a PCN reaction occurring within the last 10 years: No If all of the above answers are "NO", then may proceed with Cephalosporin use.     Objective: There were no vitals filed for this visit.  Pt is a pleasant 86 y.o. year old Caucasian female WD, WN in NAD. AAO x 3.   Vascular Examination:  Capillary refill time to digits immediate b/l. Palpable DP pulses b/l. Palpable PT pulses b/l. Pedal hair absent b/l Skin temperature gradient within normal limits b/l. No edema noted b/l.  Dermatological Examination: Pedal skin is thin shiny, atrophic bilaterally. No open wounds bilaterally. No interdigital macerations bilaterally. Toenails 1-5 b/l elongated, dystrophic, thickened, crumbly with subungual debris and tenderness to dorsal palpation. Hyperkeratotic lesion(s) L 3rd toe and R 3rd toe with subdermal hemorrhage.  No erythema, no edema, no drainage, no flocculence.  Musculoskeletal: Normal muscle strength 5/5 to all lower extremity muscle groups bilaterally. No pain crepitus or joint limitation noted with ROM b/l. Hallux  valgus with bunion deformity noted b/l.  Neurological: Protective sensation intact 5/5 intact bilaterally with 10g monofilament b/l. Vibratory sensation intact b/l. Proprioception intact bilaterally. Babinski reflex negative b/l. Clonus negative b/l.  Assessment: 1. Pain due to onychomycosis of toenails of both feet   2. Pre-ulcerative corn or callous   3. Blood clotting disorder (HCC)    Plan: -Toenails 1-5 b/l were debrided in length and girth with sterile nail nippers and dremel without iatrogenic bleeding. Avoid self-trimming due to use of blood thinner. -Preulcerative corn(s) debrided L 3rd toe and R 3rd toe without complication or incident. Total number debrided=2. Avoid self-trimming due to use of blood thinner. Dispensed digital toe caps for daily protection. -Patient to continue soft, supportive shoe gear daily. -Patient to report any pedal injuries to medical professional immediately. -Patient/POA to call should there be question/concern in the interim.  Return in about 3 months (around 06/01/2020) for nail and callus trim/ Coumadin.

## 2020-03-08 ENCOUNTER — Other Ambulatory Visit: Payer: Self-pay

## 2020-03-08 ENCOUNTER — Encounter: Payer: Self-pay | Admitting: Adult Health

## 2020-03-08 ENCOUNTER — Ambulatory Visit (INDEPENDENT_AMBULATORY_CARE_PROVIDER_SITE_OTHER): Payer: Medicare HMO | Admitting: Adult Health

## 2020-03-08 VITALS — BP 158/82 | HR 59 | Temp 97.1°F | Ht 67.0 in | Wt 128.0 lb

## 2020-03-08 DIAGNOSIS — G309 Alzheimer's disease, unspecified: Secondary | ICD-10-CM | POA: Diagnosis not present

## 2020-03-08 DIAGNOSIS — E785 Hyperlipidemia, unspecified: Secondary | ICD-10-CM

## 2020-03-08 DIAGNOSIS — Z8679 Personal history of other diseases of the circulatory system: Secondary | ICD-10-CM

## 2020-03-08 DIAGNOSIS — F0280 Dementia in other diseases classified elsewhere without behavioral disturbance: Secondary | ICD-10-CM

## 2020-03-08 DIAGNOSIS — I10 Essential (primary) hypertension: Secondary | ICD-10-CM | POA: Diagnosis not present

## 2020-03-08 DIAGNOSIS — F0150 Vascular dementia without behavioral disturbance: Secondary | ICD-10-CM

## 2020-03-08 NOTE — Patient Instructions (Signed)
Continue warfarin daily  and Vytorin for secondary stroke prevention  Continue to follow up with PCP regarding cholesterol and blood pressure management   Continue to follow with cardiology for ongoing warfarin use and INR level monitoring   Recommend reaching out the Dr. Delice Lesch for follow up regarding underlying dementia   Maintain strict control of hypertension with blood pressure goal below 130/90, diabetes with hemoglobin A1c goal below 6.5% and cholesterol with LDL cholesterol (bad cholesterol) goal below 70 mg/dL. I also advised the patient to eat a healthy diet with plenty of whole grains, cereals, fruits and vegetables, exercise regularly and maintain ideal body weight.         Thank you for coming to see [place] at [facility]. I hope we have been able to provide you high quality care today.  You may receive a patient satisfaction survey over the next few weeks. We would appreciate your feedback and comments so that we may continue to improve ourselves and the health of our patients.

## 2020-03-08 NOTE — Progress Notes (Signed)
[facility] [address] [place]. [place] [address] [phone]       HOSPITAL FOLLOW UP NOTE  Virginia Franklin Date of Birth:  13-Aug-1929 Medical Record Number:  [id number]   Reason for Referral:  hospital stroke follow up    CHIEF COMPLAINT:  Chief Complaint  Patient presents with   Follow-up    RM 9, hx of stroke, with daughter     HPI:   Today, 03/08/2020, Virginia Franklin returns for follow-up regarding [place] secondary to HTN in setting of Coumadin use on 09/04/2019.  Of note, since prior visit, she was diagnosed with L3 compression fracture on 01/17/2020 and recommended use of brace with neurosurgery follow-up and represented on 01/29/2020 for bilateral toe numbness and felt likely due to neuropathy and was advised to follow with PCP outpatient.  From a stroke standpoint, she has been stable and has returned back to [place] maintaining majority of ADLs independently but does have assistance with medication management.  Residual stroke deficits of occasional dysarthria which has been stable and cognitive impairment with slow decline.  Continues to use rolling walker for ambulation and denies any recent falls.  Daughter and patient report worsening of cognition since prior visit. Daughter reports one episode of visual hallucinations at night time but no other behavioral symptoms. Will nap during the day and at times wake up with increased confusion as well as sundowning.  Previously been followed by Dr. Delice Lesch for Alzheimer's dementia but has not had follow-up over the past year.  Continues on warfarin and Vytorin for secondary stroke prevention.  INR levels stable.  Continues to follow with cardiology regularly for ongoing monitoring and management.  Blood pressure today 158/82.  No further concerns at this time.      History provided for reference purposes only Initial visit 10/13/2019 JM: Ms. Billups is a 86 year old female who is being seen  today for hospital follow-up accompanied by her daughter.  She has been doing well since discharge but continues to experience ambulation difficulties, slightly worsening cognitive impairment from baseline and occasional dysarthria.  She does have known bilateral knee arthritis which has also been interfering with ambulation.  Currently using rolling walker and denies any falls.  Continues to live with her daughter with hopes of returning to [facility] independent living in the near future.  Currently receiving home health PT/OT through [facility] home [facility] but has not received any speech therapy.  She has continued on Seroquel due to agitation during admission but daughter questions whether this needs to continue as she has not showed any behaviors.  She continues on Namenda 10 mg twice daily as well as Lexapro 5 mg daily.  Continues on Coumadin with ongoing monitoring and management by cardiology.  Remains on Vytorin for HLD management.  Blood pressure today satisfactory 116/69.  No further concerns at this time.  Stroke admission 09/04/2019: Virginia Franklin a 86 y.o.femalewith history of mechanical heart valve replacement 50 years ago, on Coumadin, HTN, RA and hx of stroke 2012 who presented on 09/04/2019 to the ED with acute onset of confusion, right sided numbness, difficulty ambulatingand speech difficulties. CT -small acute left thalamic hemorrhage likely due to HTN in the setting of Coumadin use. - INR 2.5 -> K-Centra and vitamin K.Shedid not receive IV t-PA due to [place].  Repeat CT head showed slight interval increase of left thalamocapsular junction ICH.  CTA head/neck showed dolichoectasia of the anterior and posterior circulation correlate for history of chronic hypertension  without evidence of AVM or aneurysm.  Repeat CT head 09/12/2019 showed unchanged size of intraparenchymal hematoma of the dorsolateral left thalamus, no new site of hemorrhage, chronic microvascular ischemia  and generalized atrophy.  2D echo showed an EF of 60 to 65% with normal RV size and systolic function with St Jude mechanical aortic valve functioning normally.  Restarted on warfarin with INR goal 2.5-3.0 with history of mechanical aortic valve.  HTN stable restarting on furosemide and Zestril.  Continuation of Vytorin for HLD management.  Other stroke risk factors include advanced age, former tobacco use, EtOH use and history of stroke 2012.  Other active bowel include delirium and sundowning improved after initiation of Seroquel.  Therapies recommended CIR but insurance declined therefore recommended SNF but family declined and opted to bring patient home with home health therapies with daughter.  Discharged home on 09/14/2019 in stable condition.       ROS:   14 system review of systems performed and negative with exception of cognitive impairment  PMH:  Past Medical History:  Diagnosis Date   Body mass index (BMI) of 22.0-22.9 in adult    Cervical cancer (HCC)    H/O cervical fracture    H/O osteoporosis    H/O rheumatoid arthritis    H/O: CVA (cerebrovascular accident) 2012   H/O: osteoarthritis    History of urinary frequency    Hx of diverticulitis of colon    Hypertension     PSH:  Past Surgical History:  Procedure Laterality Date   ABDOMINAL HYSTERECTOMY     CARDIAC SURGERY     CATARACT EXTRACTION     SMALL INTESTINE SURGERY      Social History:  Social History   Socioeconomic History   Marital status: Widowed    Spouse name: Not on file   Number of children: 2   Years of education: 14   Highest education level: Not on file  Occupational History   Not on file  Tobacco Use   Smoking status: Former Smoker   Smokeless tobacco: Never Used  Substance and Sexual Activity   Alcohol use: Yes    Comment: SOCIALLY   Drug use: No   Sexual activity: Not on file    Comment: WIDOW  Other Topics Concern   Not on file  Social History Narrative    Lives alone in a retirement home.  Has 2 children.  Retired Haematologist.  Education: Probation officer school.    Social Determinants of Health   Financial Resource Strain:    Difficulty of Paying Living Expenses:   Food Insecurity:    Worried About Charity fundraiser in the Last Year:    Arboriculturist in the Last Year:   Transportation Needs:    Film/video editor (Medical):    Lack of Transportation (Non-Medical):   Physical Activity:    Days of Exercise per Week:    Minutes of Exercise per Session:   Stress:    Feeling of Stress :   Social Connections:    Frequency of Communication with Friends and Family:    Frequency of Social Gatherings with Friends and Family:    Attends Religious Services:    Active Member of Clubs or Organizations:    Attends Archivist Meetings:    Marital Status:   Intimate Partner Violence:    Fear of Current or Ex-Partner:    Emotionally Abused:    Physically Abused:    Sexually Abused:  Family History:  Family History  Problem Relation Age of Onset   Kidney disease Mother        MALIGNANT NEOPLASM   Arthritis Father    Heart Problems Father    Lung disease Brother    Heart disease Neg Hx     Medications:   Current Outpatient Medications on File Prior to Visit  Medication Sig Dispense Refill   acetaminophen (TYLENOL) 650 MG CR tablet Take 650 mg by mouth every 8 (eight) hours as needed for pain.     calcium-vitamin D (OSCAL WITH D) 500-200 MG-UNIT tablet Take 2 tablets by mouth at bedtime.     diclofenac Sodium (VOLTAREN) 1 % GEL Apply 2 g topically 4 (four) times daily.     escitalopram (LEXAPRO) 5 MG tablet TAKE 1 TABLET DAILY (Patient taking differently: Take 5 mg by mouth daily. ) 90 tablet 0   ezetimibe-simvastatin (VYTORIN) 10-40 MG tablet Take 1 tablet by mouth daily.     furosemide (LASIX) 20 MG tablet TAKE 1 TABLET DAILY (Patient taking differently: Take 20 mg by mouth daily. ) 90  tablet 3   lisinopril (ZESTRIL) 10 MG tablet Take 1 tablet (10 mg total) by mouth daily. 90 tablet 3   memantine (NAMENDA) 10 MG tablet Take 10 mg by mouth 2 (two) times daily.     Omega-3 1000 MG CAPS Take 2,000 mg by mouth daily.     OVER THE COUNTER MEDICATION Take 3 capsules by mouth daily. Turmeric with Biophrine     solifenacin (VESICARE) 5 MG tablet Take 5 mg by mouth daily.     traMADol (ULTRAM) 50 MG tablet Take 2 tablets (100 mg total) by mouth every 6 (six) hours as needed for moderate pain. 120 tablet 2   warfarin (COUMADIN) 7.5 MG tablet TAKE ONE-HALF (1/2) TO ONE TABLET DAILY AS DIRECTED BY COUMADIN CLINIC 90 tablet 0   No current facility-administered medications on file prior to visit.    Allergies:   Allergies  Allergen Reactions   Penicillins Anaphylaxis and Swelling    Has patient had a PCN reaction causing immediate rash, facial/tongue/throat swelling, SOB or lightheadedness with hypotension: Yes Has patient had a PCN reaction causing severe rash involving mucus membranes or skin necrosis: No Has patient had a PCN reaction that required hospitalization: No Has patient had a PCN reaction occurring within the last 10 years: No If all of the above answers are "NO", then may proceed with Cephalosporin use.     Vitals  Vitals:   03/08/20 0954  BP: (!) 158/82  Pulse: (!) 59  Temp: (!) 97.1 F (36.2 C)  Weight: 128 lb (58.1 kg)  Height: 5\' 7"  (1.702 m)   Body mass index is 20.05 kg/m. No exam data present  Physical exam General: well developed, well nourished,  pleasant elderly Caucasian female, seated, in no evident distress Head: head normocephalic and atraumatic.   Neck: supple with no carotid or supraclavicular bruits Cardiovascular: regular rate and rhythm, mechanical valve click Musculoskeletal: no deformity Skin:  no rash/petichiae Vascular:  Normal pulses all extremities   Mental Status: Awake and fully alert.  Limited speech and language.   Oriented to place and time. Recent and remote memory slightly diminished. Attention span, concentration and fund of knowledge impaired with daughter providing majority of history.  Mood and affect appropriate.  Cranial Nerves: Pupils equal, briskly reactive to light. Extraocular movements full without nystagmus. Visual fields full to confrontation. Hearing intact. Facial sensation intact. Face, tongue, palate  moves normally and symmetrically.  Motor: Normal bulk and tone.  Normal strength in all tested extremities. Sensory.: intact to touch , pinprick , position and vibratory sensation.  Coordination: Rapid alternating movements equal in all extremities.  Finger-to-nose and heel-to-shin performed accurately bilaterally. Gait and Station: Arises from chair without difficulty. Stance is slightly hunched. Gait demonstrates normal stride length with mild imbalance and use of Rollator walker Reflexes: 1+ and symmetric. Toes downgoing.       ASSESSMENT: Virginia Franklin is a 86 y.o. year old female presented with acute onset confusion, right-sided numbness, speech difficulties and difficulty ambulating on 09/04/2019 with stroke work-up revealing small left lateral thalamus and posterior limb internal capsule ICH likely secondary to HTN in setting of Coumadin use. Vascular risk factors include mechanical heart valve replacement 50 years ago on Coumadin, HTN, HLD and history of stroke.  Stable from a stroke standpoint with occasional dysarthria and worsening cognitive impairment from baseline which has been gradually worsening per daughter.    PLAN:  1. ICH: Continue warfarin daily  and Vytorin for secondary stroke prevention. Maintain strict control of hypertension with blood pressure goal below 130/90, diabetes with hemoglobin A1c goal below 6.5% and cholesterol with LDL cholesterol (bad cholesterol) goal below 70 mg/dL.  I also advised the patient to eat a healthy diet with plenty of whole grains,  cereals, fruits and vegetables, exercise regularly with at least 30 minutes of continuous activity daily and maintain ideal body weight. 2. HTN: Slightly elevated today, asymptomatic.  Recommend routine monitoring at [facility] and follow-up with PCP or cardiology for ongoing monitoring management 3. HLD: Continuation of Vytorin and ongoing prescribing, monitoring management by PCP 4. Mechanical heart valve: Continuation of Coumadin with INR goal 2.5-3.0 and ongoing fall cardiology for monitoring and management 5. Mixed dementia: Underlying history of Alzheimer's dementia with slight worsening post stroke.  Recommend scheduling follow-up visit with Dr. Delice Lesch who has been following patient for dementia monitoring and management    Overall stable from stroke standpoint recommend follow-up as needed   I spent 26 minutes of face-to-face and non-face-to-face time with patient and daughter.  This included previsit chart review, lab review, study review, order entry, electronic health record documentation, patient education regarding ICH, importance of managing shortness factors, gradual decline of likely mixed dementia and answered all questions to patient and daughter satisfaction     Frann Rider, AGNP-BC  [facility] [address] [place], [place] [address]  Phone [phone] Fax [phone] Note: This document was prepared with digital dictation and possible smart phrase technology. Any transcriptional errors that result from this process are unintentional.

## 2020-03-14 NOTE — Progress Notes (Signed)
I agree with the above plan 

## 2020-03-21 ENCOUNTER — Ambulatory Visit (INDEPENDENT_AMBULATORY_CARE_PROVIDER_SITE_OTHER): Payer: Medicare HMO | Admitting: *Deleted

## 2020-03-21 ENCOUNTER — Other Ambulatory Visit: Payer: Self-pay

## 2020-03-21 DIAGNOSIS — Z7901 Long term (current) use of anticoagulants: Secondary | ICD-10-CM

## 2020-03-21 DIAGNOSIS — Z952 Presence of prosthetic heart valve: Secondary | ICD-10-CM

## 2020-03-21 DIAGNOSIS — Z5181 Encounter for therapeutic drug level monitoring: Secondary | ICD-10-CM

## 2020-03-21 LAB — POCT INR: INR: 1.8 — AB (ref 2.0–3.0)

## 2020-03-21 NOTE — Patient Instructions (Signed)
Description   Take 1.5 tablets today, then start taking 1 tablet daily except for 1/2 a tablet on Sunday and Thursdays.  Call Coumadin clinic with questions [phone]. Recheck INR in 2 weeks. [name] fills pt's pill box, please call [phone]

## 2020-03-21 NOTE — Progress Notes (Signed)
Called and spoke to [place], who fixes pt's pills. Updated him on the dosing instructions.

## 2020-03-23 ENCOUNTER — Other Ambulatory Visit: Payer: Self-pay | Admitting: Family Medicine

## 2020-04-01 ENCOUNTER — Other Ambulatory Visit: Payer: Self-pay | Admitting: Family Medicine

## 2020-04-04 ENCOUNTER — Ambulatory Visit (INDEPENDENT_AMBULATORY_CARE_PROVIDER_SITE_OTHER): Payer: Medicare HMO | Admitting: *Deleted

## 2020-04-04 ENCOUNTER — Other Ambulatory Visit: Payer: Self-pay

## 2020-04-04 DIAGNOSIS — Z952 Presence of prosthetic heart valve: Secondary | ICD-10-CM | POA: Diagnosis not present

## 2020-04-04 DIAGNOSIS — Z5181 Encounter for therapeutic drug level monitoring: Secondary | ICD-10-CM

## 2020-04-04 DIAGNOSIS — Z7901 Long term (current) use of anticoagulants: Secondary | ICD-10-CM | POA: Diagnosis not present

## 2020-04-04 LAB — POCT INR: INR: 3.9 — AB (ref 2.0–3.0)

## 2020-04-04 NOTE — Progress Notes (Signed)
Called [place] and gave him dosing instructions.

## 2020-04-04 NOTE — Patient Instructions (Signed)
Description   Hold warfarin today and then continue taking 1 tablet daily except for 1/2 a tablet on Sunday and Thursdays.  Call Coumadin clinic with questions [phone]. Recheck INR in 2 weeks. [name] fills pt's pill box, please call [phone]

## 2020-04-06 ENCOUNTER — Other Ambulatory Visit: Payer: Self-pay

## 2020-04-06 ENCOUNTER — Ambulatory Visit (INDEPENDENT_AMBULATORY_CARE_PROVIDER_SITE_OTHER): Payer: Medicare HMO | Admitting: Neurology

## 2020-04-06 ENCOUNTER — Other Ambulatory Visit: Payer: Medicare HMO

## 2020-04-06 ENCOUNTER — Encounter: Payer: Self-pay | Admitting: Neurology

## 2020-04-06 VITALS — BP 161/77 | HR 76 | Ht 67.0 in | Wt 127.2 lb

## 2020-04-06 DIAGNOSIS — R5383 Other fatigue: Secondary | ICD-10-CM | POA: Diagnosis not present

## 2020-04-06 DIAGNOSIS — F0150 Vascular dementia without behavioral disturbance: Secondary | ICD-10-CM | POA: Diagnosis not present

## 2020-04-06 DIAGNOSIS — F0280 Dementia in other diseases classified elsewhere without behavioral disturbance: Secondary | ICD-10-CM

## 2020-04-06 DIAGNOSIS — G309 Alzheimer's disease, unspecified: Secondary | ICD-10-CM | POA: Diagnosis not present

## 2020-04-06 DIAGNOSIS — Z8679 Personal history of other diseases of the circulatory system: Secondary | ICD-10-CM | POA: Diagnosis not present

## 2020-04-06 LAB — TSH: TSH: 1.29 u[IU]/mL (ref 0.35–4.50)

## 2020-04-06 LAB — COMPREHENSIVE METABOLIC PANEL
ALT: 17 U/L (ref 0–35)
AST: 22 U/L (ref 0–37)
Albumin: 4.2 g/dL (ref 3.5–5.2)
Alkaline Phosphatase: 85 U/L (ref 39–117)
BUN: 14 mg/dL (ref 6–23)
CO2: 30 mEq/L (ref 19–32)
Calcium: 9.3 mg/dL (ref 8.4–10.5)
Chloride: 102 mEq/L (ref 96–112)
Creatinine, Ser: 0.75 mg/dL (ref 0.40–1.20)
GFR: 72.78 mL/min (ref >60.00)
Glucose, Bld: 100 mg/dL — ABNORMAL HIGH (ref 70–99)
Potassium: 4.2 mEq/L (ref 3.5–5.1)
Sodium: 137 mEq/L (ref 135–145)
Total Bilirubin: 0.5 mg/dL (ref 0.2–1.2)
Total Protein: 7.1 g/dL (ref 6.0–8.3)

## 2020-04-06 LAB — CBC
HCT: 39.2 % (ref 36.0–46.0)
Hemoglobin: 13 g/dL (ref 12.0–15.0)
MCHC: 33.1 g/dL (ref 30.0–36.0)
MCV: 91.7 fl (ref 78.0–100.0)
Platelets: 236 10*3/uL (ref 150.0–400.0)
RBC: 4.27 Mil/uL (ref 3.87–5.11)
RDW: 14.8 % (ref 11.5–15.5)
WBC: 5.6 10*3/uL (ref 4.0–10.5)

## 2020-04-06 LAB — VITAMIN B12: Vitamin B-12: 491 pg/mL (ref 211–911)

## 2020-04-06 LAB — VITAMIN D 25 HYDROXY (VIT D DEFICIENCY, FRACTURES): VITD: 29.85 ng/mL — ABNORMAL LOW (ref 30.00–100.00)

## 2020-04-06 NOTE — Progress Notes (Signed)
NEUROLOGY FOLLOW UP OFFICE NOTE  Virginia Franklin QS:[id number] 10-14-1929  HISTORY OF PRESENT ILLNESS: I had the pleasure of seeing Virginia Franklin in follow-up in the neurology clinic on 04/06/2020.  The patient was last seen in October 2019 for dementia, likely mixed dementia. She is again accompanied by her daughter who helps supplement the history today.  Records and images were personally reviewed where available.  Since her last visit, she was admitted for a small acute left thalamic hemorrhage last October 2020 felt secondary to HTN in the setting of coumadin use. Stroke workup was unremarkable, she had seen the stroke service at [facility] for follow-up. She was restarted on Coumadin for the mechanical aortic valve. Stroke deficits included occasional dysarthria, difficulty with ambulation, and worsening cognition. Apparently prior to the stroke, she was having cyclical word-finding difficulties with slurred speech, then called her daughter confused if it was morning or night. She does not remember much of this. She is back to living at [facility]. Her daughter reports she has become more forgetful, there have been 4 instances she goes outside waiting to be picked up when nothing is scheduled. She does not remember this. She would miss dinner because of this. She has a calendar but does not remember to pencil in appointments. Staff administers her morning medications, she takes 2 medications by herself at night. She would call her daughter that she did not get her morning pills even if staff had administered it. Her INR fluctuates, but her daughter wonders if this is due to her diet. She has lost a lot of weight and does not like the food at the facility. She refused to eat in the hospital. She eats food she likes, she is good when at her daughter's house. Sleep is good but she complains of being tired all the time. She feels her memory is okay. She is independent with dressing and bathing. There  have been a few instances of hallucinations. A couple of months ago, she woke up at 3am and went down saying there were things in her room. She was trying to grab it, not sure if it was a person, reporting things floating around. They went shopping the next day and she said she saw something floating in the car, like a big flat fish. She is on Memantine 10mg  BID and Lexapro 5mg  daily without side effects. She had GI side effects on Donepezil.    History on Initial Assessment 01/28/2018: This is an 86 year old right-handed woman with a history of aortic valve replacement on anticoagulation with Coumadin, hypertension, cervical cancer, presenting after hospitalization on 12/16/17 for altered mental status.  Records were reviewed. Patient had reported that a few days prior to admission, she had an episode where she was unable to recall different things in the house. This lasted for a few hours then resolved. On 12/16/17, she was at the Coumadin clinic having her INR checked, when she had an episode where she was reported to zone out and was non-verbal. She did not recall the episode. She recalls having her finger pricked, she denies feeling dizzy or any other warning symptoms. Per notes it lasted 45 seconds, but her daughter reports she was still not back to baseline when she arrived in the hospital. She could not say what year it was or who the president is. Her daughter had reported that over the past 2 years, she has had around 5 episodes of confusion and inability to recall events. She saw  a neurologist in [place], per daughter workup showed electrolyte abnormalities and prior strokes. These usually occur upon awakening, it takes her 20-30 minutes to get her bearings. Her daughter has not witnessed them, but her son had reported that she was not quite herself. She drank her electrolyte drink and within 20 minutes was back to normal. At [facility], she had an MRI brain without contrast which I personally reviewed, no  acute changes, there was moderate chronic microvascular disease and volume loss. Her wake and sleep EEG was within normal limits. It was felt that she had some underlying dementia and Aricept was recommended, she has not started this.  She moved to [facility] in October 2018 and has been living in a retirement community at [facility]. Her daughter reported progressive memory problems over the past 5-7 years, worse in the past couple of years. She has gotten lost driving a couple of times and has stopped driving since moving to [place]. She feels her memory is not good, she is forgetting quite a bit. Her daughter took over her finances in November 2018. She manages her own medications, although her daughter reports that one time she checked and a couple of things were off with her medication, she missed an entire day of her pillbox. Her daughter comes twice a week to visit.   She has occasional vertigo from inner ear issues. She has occasional trouble swallowing, neck and back pain, urinary incontinence, constipation. She has occasional tremor on her right hand when painting. She denies any headaches, dizziness, diplopia, dysarthria, focal numbness/tingling/weakness, anosmia. She still feels that her thinking is not straight. She is still forgetting things and repeating conversations. Se denies any olfactory/gustatory hallucinations, deja vu, rising epigastric sensation, focal numbness/tingling/weakness, myoclonic jerks. She had a normal birth and early development.  There is no history of febrile convulsions, CNS infections such as meningitis/encephalitis, significant traumatic brain injury, neurosurgical procedures, or family history of seizures.  Diagnostic Data: Neuropsychological evaluation in 04/2018 showed mild dementia, most likely due to Alzheimer's disease, but cannot rule out mixed dementia (Alzheimer's and vascular dementia). MRI brain without contrast done 12/2017 no acute changes, there was moderate  diffuse volume loss and moderate chronic microvascular disease.   PAST MEDICAL HISTORY: Past Medical History:  Diagnosis Date  . Body mass index (BMI) of 22.0-22.9 in adult   . Cervical cancer ([place])   . H/O cervical fracture   . H/O osteoporosis   . H/O rheumatoid arthritis   . H/O: CVA (cerebrovascular accident) 2012  . H/O: osteoarthritis   . History of urinary frequency   . Hx of diverticulitis of colon   . Hypertension     MEDICATIONS: Current Outpatient Medications on File Prior to Visit  Medication Sig Dispense Refill  . acetaminophen (TYLENOL) 650 MG CR tablet Take 650 mg by mouth every 8 (eight) hours as needed for pain.    . calcium-vitamin D (OSCAL WITH D) 500-200 MG-UNIT tablet Take 2 tablets by mouth at bedtime.    . diclofenac Sodium (VOLTAREN) 1 % GEL Apply 2 g topically 4 (four) times daily.    Marland Kitchen escitalopram (LEXAPRO) 5 MG tablet TAKE 1 TABLET DAILY 90 tablet 3  . ezetimibe-simvastatin (VYTORIN) 10-40 MG tablet Take 1 tablet by mouth daily.    . furosemide (LASIX) 20 MG tablet TAKE 1 TABLET DAILY 90 tablet 3  . lisinopril (ZESTRIL) 10 MG tablet Take 1 tablet (10 mg total) by mouth daily. 90 tablet 3  . memantine (NAMENDA) 10 MG tablet  Take 10 mg by mouth 2 (two) times daily.    . Omega-3 1000 MG CAPS Take 2,000 mg by mouth daily.    Marland Kitchen OVER THE COUNTER MEDICATION Take 3 capsules by mouth daily. Turmeric with Biophrine    . solifenacin (VESICARE) 5 MG tablet Take 5 mg by mouth daily.    . traMADol (ULTRAM) 50 MG tablet Take 2 tablets (100 mg total) by mouth every 6 (six) hours as needed for moderate pain. 120 tablet 2  . warfarin (COUMADIN) 7.5 MG tablet TAKE ONE-HALF (1/2) TO ONE TABLET DAILY AS DIRECTED BY COUMADIN CLINIC 90 tablet 0   No current facility-administered medications on file prior to visit.    ALLERGIES: Allergies  Allergen Reactions  . Penicillins Anaphylaxis and Swelling    Has patient had a PCN reaction causing immediate rash,  facial/tongue/throat swelling, SOB or lightheadedness with hypotension: Yes Has patient had a PCN reaction causing severe rash involving mucus membranes or skin necrosis: No Has patient had a PCN reaction that required hospitalization: No Has patient had a PCN reaction occurring within the last 10 years: No If all of the above answers are "NO", then may proceed with Cephalosporin use.    FAMILY HISTORY: Family History  Problem Relation Age of Onset  . Kidney disease Mother        MALIGNANT NEOPLASM  . Arthritis Father   . Heart Problems Father   . Lung disease Brother   . Heart disease Neg Hx     SOCIAL HISTORY: Social History   Socioeconomic History  . Marital status: Widowed    Spouse name: Not on file  . Number of children: 2  . Years of education: 74  . Highest education level: Not on file  Occupational History  . Not on file  Tobacco Use  . Smoking status: Former Research scientist (life sciences)  . Smokeless tobacco: Never Used  Substance and Sexual Activity  . Alcohol use: Yes    Comment: SOCIALLY  . Drug use: No  . Sexual activity: Not on file    Comment: WIDOW  Other Topics Concern  . Not on file  Social History Narrative   Lives alone in a retirement home.  Has 2 children.  Retired Haematologist.  Education: Probation officer school.    Social Determinants of Health   Financial Resource Strain:   . Difficulty of Paying Living Expenses:   Food Insecurity:   . Worried About Charity fundraiser in the Last Year:   . Arboriculturist in the Last Year:   Transportation Needs:   . Film/video editor (Medical):   Marland Kitchen Lack of Transportation (Non-Medical):   Physical Activity:   . Days of Exercise per Week:   . Minutes of Exercise per Session:   Stress:   . Feeling of Stress :   Social Connections:   . Frequency of Communication with Friends and Family:   . Frequency of Social Gatherings with Friends and Family:   . Attends Religious Services:   . Active Member of Clubs or  Organizations:   . Attends Archivist Meetings:   Marland Kitchen Marital Status:   Intimate Partner Violence:   . Fear of Current or Ex-Partner:   . Emotionally Abused:   Marland Kitchen Physically Abused:   . Sexually Abused:     PHYSICAL EXAM: Vitals:   04/06/20 1009  BP: (!) 161/77  Pulse: 76  SpO2: 95%   General: No acute distress Head:  Normocephalic/atraumatic Skin/Extremities: No rash, no  edema Neurological Exam: alert and oriented to person, season, ,says it is June 2020. No aphasia or dysarthria. Fund of knowledge is reduced. Recent and remote memory are impaired. Attention and concentration are normal.  Able to name objects and repeat phrases. MMSE 19/30 MMSE - Mini Mental State Exam 04/06/2020  Orientation to time 1  Orientation to Place 1  Registration 3  Attention/ Calculation 5  Recall 0  Language- name 2 objects 2  Language- repeat 1  Language- follow 3 step command 3  Language- read & follow direction 1  Write a sentence 1  Copy design 1  Total score 19    Cranial nerves: Pupils equal, round, reactive to light. Extraocular movements intact with no nystagmus. Visual fields full. Facial sensation intact. No facial asymmetry. Motor: Bulk and tone normal, muscle strength 5/5 throughout with no pronator drift.  Deep tendon reflexes +1 throughout.  Finger to nose testing intact.  Gait slow and cautious, no ataxia.  Will let her stay indep as long as she can, and can hire ppl to come and help  IMPRESSION: This is an 86 yo RH woman with a history of aortic valve replacement on anticoagulation with Coumadin, hypertension, cervical cancer, left thalamic hemorrhage, with dementia, likely mixed dementia (Alzheimer's and vascular dementia). MMSE today 19/30. She is doing well post-stroke, back to independent living at [facility]. Continue Memantine 10mg  BID and Lexapro 5mg  daily. Continue to closely monitor compliance, family can hire aides to come help, they would like her to stay as  independent as long as possible. She is reporting fatigue, check CBC, CMP, vitamin D and vitamin D levels, TSH, follow-up with PCP. She does not drive. Follow-up in 6 months, they know to call for any changes.    Thank you for allowing me to participate in her care.  Please do not hesitate to call for any questions or concerns.   Ellouise Newer, M.D.   CC: Dr. Sarajane Jews

## 2020-04-06 NOTE — Patient Instructions (Signed)
1. Bloodwork for CBC, CMP, vitamin D, vitamin B12, TSH  2. Continue all your medications, continue to closely monitor medications  3. Follow-up in 6 months, call for any changes  FALL PRECAUTIONS: Be cautious when walking. Scan the area for obstacles that may increase the risk of trips and falls. When getting up in the mornings, sit up at the edge of the bed for a few minutes before getting out of bed. Consider elevating the bed at the head end to avoid drop of blood pressure when getting up. Walk always in a well-lit room (use night lights in the walls). Avoid area rugs or power cords from appliances in the middle of the walkways. Use a walker or a cane if necessary and consider physical therapy for balance exercise. Get your eyesight checked regularly.  HOME SAFETY: Consider the safety of the kitchen when operating appliances like stoves, microwave oven, and blender. Consider having supervision and share cooking responsibilities until no longer able to participate in those. Accidents with firearms and other hazards in the house should be identified and addressed as well.  ABILITY TO BE LEFT ALONE: If patient is unable to contact 911 operator, consider using LifeLine, or when the need is there, arrange for someone to stay with patients. Smoking is a fire hazard, consider supervision or cessation. Risk of wandering should be assessed by caregiver and if detected at any point, supervision and safe proof recommendations should be instituted.   RECOMMENDATIONS FOR ALL PATIENTS WITH MEMORY PROBLEMS: 1. Continue to exercise (Recommend 30 minutes of walking everyday, or 3 hours every week) 2. Increase social interactions - continue going to House and enjoy social gatherings with friends and family 3. Eat healthy, avoid fried foods and eat more fruits and vegetables 4. Maintain adequate blood pressure, blood sugar, and blood cholesterol level. Reducing the risk of stroke and cardiovascular disease also  helps promoting better memory. 5. Avoid stressful situations. Live a simple life and avoid aggravations. Organize your time and prepare for the next day in anticipation. 6. Sleep well, avoid any interruptions of sleep and avoid any distractions in the bedroom that may interfere with adequate sleep quality 7. Avoid sugar, avoid sweets as there is a strong link between excessive sugar intake, diabetes, and cognitive impairment The Mediterranean diet has been shown to help patients reduce the risk of progressive memory disorders and reduces cardiovascular risk. This includes eating fish, eat fruits and green leafy vegetables, nuts like almonds and hazelnuts, walnuts, and also use olive oil. Avoid fast foods and fried foods as much as possible. Avoid sweets and sugar as sugar use has been linked to worsening of memory function.  There is always a concern of gradual progression of memory problems. If this is the case, then we may need to adjust level of care according to patient needs. Support, both to the patient and caregiver, should then be put into place.

## 2020-04-11 ENCOUNTER — Other Ambulatory Visit: Payer: Self-pay | Admitting: Interventional Cardiology

## 2020-04-12 ENCOUNTER — Telehealth: Payer: Self-pay

## 2020-04-12 NOTE — Telephone Encounter (Signed)
-----   Message from Cameron Sprang, MD sent at 04/12/2020 10:30 AM EDT ----- Pls let patient/daughter know the bloodwork was overall okay, no anemia or thyroid abnormalities. Her vitamin D level was still low but not as low as before, recommend taking a daily calcium with vitamin D supplement, discuss this with PCP. thanks

## 2020-04-12 NOTE — Telephone Encounter (Signed)
Spoke with pt daughter informed her that  the bloodwork was overall okay, no anemia or thyroid abnormalities. Her vitamin D level was still low but not as low as before, recommend taking a daily calcium with vitamin D supplement, discuss this with PCP. Pt daughter stated she would check to see if her mother was taken the supplement if not she would get her started on it and they would follow up with the PCP

## 2020-04-18 ENCOUNTER — Ambulatory Visit (INDEPENDENT_AMBULATORY_CARE_PROVIDER_SITE_OTHER): Payer: Medicare HMO

## 2020-04-18 ENCOUNTER — Other Ambulatory Visit: Payer: Self-pay | Admitting: Family Medicine

## 2020-04-18 ENCOUNTER — Other Ambulatory Visit: Payer: Self-pay

## 2020-04-18 DIAGNOSIS — Z7901 Long term (current) use of anticoagulants: Secondary | ICD-10-CM | POA: Diagnosis not present

## 2020-04-18 DIAGNOSIS — Z952 Presence of prosthetic heart valve: Secondary | ICD-10-CM | POA: Diagnosis not present

## 2020-04-18 DIAGNOSIS — Z5181 Encounter for therapeutic drug level monitoring: Secondary | ICD-10-CM

## 2020-04-18 LAB — POCT INR: INR: 3.5 — AB (ref 2.0–3.0)

## 2020-04-18 NOTE — Patient Instructions (Signed)
Description   Start taking 1 tablet daily except for 1/2 a tablet on Sundays, Tuesdays and Thursdays.  Call Coumadin clinic with questions [phone]. Recheck INR in 2 weeks. [name] fills pt's pill box, please call [phone]

## 2020-04-21 ENCOUNTER — Encounter: Payer: Self-pay | Admitting: Podiatry

## 2020-04-21 ENCOUNTER — Other Ambulatory Visit: Payer: Self-pay

## 2020-04-21 ENCOUNTER — Ambulatory Visit (INDEPENDENT_AMBULATORY_CARE_PROVIDER_SITE_OTHER): Payer: Medicare HMO | Admitting: Podiatry

## 2020-04-21 VITALS — Temp 97.2°F

## 2020-04-21 DIAGNOSIS — L97522 Non-pressure chronic ulcer of other part of left foot with fat layer exposed: Secondary | ICD-10-CM

## 2020-04-21 DIAGNOSIS — M2042 Other hammer toe(s) (acquired), left foot: Secondary | ICD-10-CM | POA: Diagnosis not present

## 2020-04-21 MED ORDER — DOXYCYCLINE HYCLATE 100 MG PO TABS: 100.0000 mg | ORAL_TABLET | Freq: Two times a day (BID) | ORAL | 0 refills | Status: DC

## 2020-04-21 NOTE — Progress Notes (Signed)
Subjective:   Patient ID: Virginia Franklin, female   DOB: 86 y.o.   MRN: [id number]   HPI Patient presents with caregiver stating she suddenly developed drainage between the hallux and second toe left and she does have dementia so they are not sure when this started.  Patient states that it seems to be painful but it is difficult to get complete answer from   ROS      Objective:  Physical Exam  Neurovascular status intact with small breakdown of tissue left hallux left second digit with pressure between the 2.  They both measure about 6 x 6 mm with no tendon or bone exposure with subcutaneous exposure with mild proximal erythema around the second metatarsal phalangeal joint     Assessment:  Digital deformity creating compression pressure between the hallux and second toe leading to ulceration     Plan:  H&P reviewed condition with her caregiver.  This is difficult because she does have dementia but at this time I carefully clean the wounds I flushed them and I applied padding with topical Silvadene treatment.  I applied sterile dressing and I instructed on leaving this on for several days and replacing it along with soaks and I dispensed surgical shoe and placed on doxycycline twice daily.  Patient will be seen back in 1 week and was given strict instructions of any changes were to occur to go straight to the emergency room

## 2020-04-28 ENCOUNTER — Ambulatory Visit (INDEPENDENT_AMBULATORY_CARE_PROVIDER_SITE_OTHER): Payer: Medicare HMO

## 2020-04-28 ENCOUNTER — Ambulatory Visit (INDEPENDENT_AMBULATORY_CARE_PROVIDER_SITE_OTHER): Payer: Medicare HMO | Admitting: Podiatry

## 2020-04-28 ENCOUNTER — Other Ambulatory Visit: Payer: Self-pay

## 2020-04-28 ENCOUNTER — Encounter: Payer: Self-pay | Admitting: Podiatry

## 2020-04-28 DIAGNOSIS — L84 Corns and callosities: Secondary | ICD-10-CM

## 2020-04-28 DIAGNOSIS — M79674 Pain in right toe(s): Secondary | ICD-10-CM | POA: Diagnosis not present

## 2020-04-28 DIAGNOSIS — L97522 Non-pressure chronic ulcer of other part of left foot with fat layer exposed: Secondary | ICD-10-CM

## 2020-04-28 DIAGNOSIS — M79675 Pain in left toe(s): Secondary | ICD-10-CM

## 2020-04-28 DIAGNOSIS — B351 Tinea unguium: Secondary | ICD-10-CM

## 2020-04-28 DIAGNOSIS — D689 Coagulation defect, unspecified: Secondary | ICD-10-CM

## 2020-04-28 MED ORDER — DOXYCYCLINE HYCLATE 100 MG PO TABS: 100.0000 mg | ORAL_TABLET | Freq: Two times a day (BID) | ORAL | 0 refills | Status: DC

## 2020-04-28 NOTE — Progress Notes (Signed)
Subjective:   Patient ID: Virginia Franklin, female   DOB: 86 y.o.   MRN: [id number]   HPI Patient presents with caregiver stating that she still has problems between her second and big toe left and its been hard for them to provide perfect care due to the fact they live 45 minutes apart.  Also complained about her nails and lesions on both feet   ROS      Objective:  Physical Exam  Neurovascular status unchanged with continued redness around the second MPJ left that is not changed with breakdown of tissue hallux second digit left where there is pressure between the 2 and it is hard for her to take care of this herself.  It is localized there is no odor associated with it the patient has elongated nailbeds 1-5 both feet that are thick yellow brittle and keratotic lesions distal digit 3 digit 3 right and left     Assessment:  Localized ulceration of the hallux second toe low-grade cellulitis mycotic nail infection corn callus formation     Plan:  H&P reviewed all conditions and debrided nailbeds 1-5 both feet and lesions third digit bilateral no iatrogenic bleeding.  I then went ahead discussed the second and we did make her a small device to try to reduce the pressure and separate the toes which she said felt good and then I advised on continued localized wound care soaks and continuation of oral antibiotic doxycycline.  Strict instructions of any further redness or systemic signs of infection to go straight to hospital and she does understand that ultimately she may require amputation due to this condition  X-rays indicate no indications currently of osteolysis or bone infection

## 2020-05-02 ENCOUNTER — Other Ambulatory Visit: Payer: Self-pay

## 2020-05-02 ENCOUNTER — Ambulatory Visit (INDEPENDENT_AMBULATORY_CARE_PROVIDER_SITE_OTHER): Payer: Medicare HMO

## 2020-05-02 DIAGNOSIS — Z5181 Encounter for therapeutic drug level monitoring: Secondary | ICD-10-CM

## 2020-05-02 DIAGNOSIS — Z7901 Long term (current) use of anticoagulants: Secondary | ICD-10-CM

## 2020-05-02 DIAGNOSIS — Z952 Presence of prosthetic heart valve: Secondary | ICD-10-CM

## 2020-05-02 LAB — POCT INR: INR: 4.9 — AB (ref 2.0–3.0)

## 2020-05-02 NOTE — Addendum Note (Signed)
Addended by: Johny Shock B on: 05/02/2020 01:43 PM   Modules accepted: Orders

## 2020-05-02 NOTE — Patient Instructions (Signed)
Description   Hold today and tomorrow, then while on the doxy take 1/2 a tablet daily except for 1 tablet on Wednesday and Saturday. Recheck INR on 7/2.  Call Coumadin clinic with questions [phone]. [name] fills pt's pill box, please call [phone]

## 2020-05-13 ENCOUNTER — Other Ambulatory Visit: Payer: Self-pay

## 2020-05-13 ENCOUNTER — Ambulatory Visit (INDEPENDENT_AMBULATORY_CARE_PROVIDER_SITE_OTHER): Payer: Medicare HMO | Admitting: *Deleted

## 2020-05-13 DIAGNOSIS — Z5181 Encounter for therapeutic drug level monitoring: Secondary | ICD-10-CM | POA: Diagnosis not present

## 2020-05-13 DIAGNOSIS — Z7901 Long term (current) use of anticoagulants: Secondary | ICD-10-CM | POA: Diagnosis not present

## 2020-05-13 DIAGNOSIS — Z952 Presence of prosthetic heart valve: Secondary | ICD-10-CM

## 2020-05-13 LAB — POCT INR: INR: 1.2 — AB (ref 2.0–3.0)

## 2020-05-13 NOTE — Progress Notes (Signed)
Spoke to [place] and gave him the dosing instructions. He stated he thinks pt might have missed a few doses of doxy.

## 2020-05-13 NOTE — Patient Instructions (Signed)
Description   Take 1.5 tablets today and tomorrow, then continue to take  1 tablet daily except for 1/2 a tablet on Sunday, Tuesday and Thursday. Recheck INR in 1 week. Call Coumadin clinic with questions [phone]. [name] fills pt's pill box, please call [phone]

## 2020-05-19 ENCOUNTER — Encounter: Payer: Self-pay | Admitting: Podiatry

## 2020-05-19 ENCOUNTER — Ambulatory Visit (INDEPENDENT_AMBULATORY_CARE_PROVIDER_SITE_OTHER): Payer: Medicare HMO | Admitting: Podiatry

## 2020-05-19 ENCOUNTER — Other Ambulatory Visit: Payer: Self-pay

## 2020-05-19 DIAGNOSIS — D689 Coagulation defect, unspecified: Secondary | ICD-10-CM | POA: Diagnosis not present

## 2020-05-19 DIAGNOSIS — M2042 Other hammer toe(s) (acquired), left foot: Secondary | ICD-10-CM

## 2020-05-19 DIAGNOSIS — L97522 Non-pressure chronic ulcer of other part of left foot with fat layer exposed: Secondary | ICD-10-CM

## 2020-05-20 NOTE — Progress Notes (Signed)
Subjective:   Patient ID: Virginia Franklin, female   DOB: 86 y.o.   MRN: [id number]   HPI Patient presents with daughter stating that she is doing quite a bit better and the padding seem to help her   ROS      Objective:  Physical Exam  Neurovascular status unchanged with lesion between the hallux and second digit left improving with pain which has gotten better     Assessment:  Doing better from having had a breakdown of tissue between the hallux and second toe left     Plan:  Reviewed the continuation of conservative treatment and discussed the ulceration with her and daughter.  Reviewed her circulatory status and the possibility of this breaking down again and we reviewed shoe gear choices and the different types of padding systems she can utilize

## 2020-05-23 ENCOUNTER — Other Ambulatory Visit: Payer: Self-pay

## 2020-05-23 ENCOUNTER — Ambulatory Visit (INDEPENDENT_AMBULATORY_CARE_PROVIDER_SITE_OTHER): Payer: Medicare HMO | Admitting: *Deleted

## 2020-05-23 DIAGNOSIS — Z952 Presence of prosthetic heart valve: Secondary | ICD-10-CM

## 2020-05-23 DIAGNOSIS — Z5181 Encounter for therapeutic drug level monitoring: Secondary | ICD-10-CM | POA: Diagnosis not present

## 2020-05-23 DIAGNOSIS — Z7901 Long term (current) use of anticoagulants: Secondary | ICD-10-CM | POA: Diagnosis not present

## 2020-05-23 LAB — POCT INR: INR: 1.7 — AB (ref 2.0–3.0)

## 2020-05-23 NOTE — Patient Instructions (Signed)
Description   Start taking 1 tablet daily except for 1/2 tablet on Sunday and Thursday. Recheck INR in 1 week. Call Coumadin clinic with questions [phone]. [name] fills pt's pill box, please call [phone]

## 2020-06-01 ENCOUNTER — Other Ambulatory Visit: Payer: Self-pay

## 2020-06-01 ENCOUNTER — Ambulatory Visit: Payer: Medicare HMO | Admitting: *Deleted

## 2020-06-01 DIAGNOSIS — Z952 Presence of prosthetic heart valve: Secondary | ICD-10-CM | POA: Diagnosis not present

## 2020-06-01 DIAGNOSIS — Z5181 Encounter for therapeutic drug level monitoring: Secondary | ICD-10-CM | POA: Diagnosis not present

## 2020-06-01 DIAGNOSIS — Z7901 Long term (current) use of anticoagulants: Secondary | ICD-10-CM | POA: Diagnosis not present

## 2020-06-01 LAB — POCT INR: INR: 1.2 — AB (ref 2.0–3.0)

## 2020-06-01 NOTE — Progress Notes (Signed)
Called and spoke to [place] and gave him dosing instructions.

## 2020-06-01 NOTE — Patient Instructions (Addendum)
Description   Take 1.5 tablets today and 1 tablet tomorrow then continue taking 1 tablet daily except for 1/2 tablet on Sunday and Thursday. Recheck INR in 1 week. Call Coumadin clinic with questions [phone]. [name] fills pt's pill box, please call [phone]

## 2020-06-08 ENCOUNTER — Encounter: Payer: Self-pay | Admitting: Podiatry

## 2020-06-08 ENCOUNTER — Ambulatory Visit (INDEPENDENT_AMBULATORY_CARE_PROVIDER_SITE_OTHER): Payer: Medicare HMO | Admitting: Podiatry

## 2020-06-08 ENCOUNTER — Other Ambulatory Visit: Payer: Self-pay

## 2020-06-08 ENCOUNTER — Ambulatory Visit (INDEPENDENT_AMBULATORY_CARE_PROVIDER_SITE_OTHER): Payer: Medicare HMO

## 2020-06-08 DIAGNOSIS — M2042 Other hammer toe(s) (acquired), left foot: Secondary | ICD-10-CM

## 2020-06-08 DIAGNOSIS — L02612 Cutaneous abscess of left foot: Secondary | ICD-10-CM | POA: Diagnosis not present

## 2020-06-08 DIAGNOSIS — L97522 Non-pressure chronic ulcer of other part of left foot with fat layer exposed: Secondary | ICD-10-CM

## 2020-06-08 DIAGNOSIS — L03032 Cellulitis of left toe: Secondary | ICD-10-CM

## 2020-06-08 MED ORDER — CLINDAMYCIN HCL 300 MG PO CAPS: 300.0000 mg | ORAL_CAPSULE | Freq: Three times a day (TID) | ORAL | 0 refills | Status: DC

## 2020-06-09 ENCOUNTER — Telehealth: Payer: Self-pay | Admitting: *Deleted

## 2020-06-09 ENCOUNTER — Encounter: Payer: Self-pay | Admitting: Podiatry

## 2020-06-09 ENCOUNTER — Ambulatory Visit (INDEPENDENT_AMBULATORY_CARE_PROVIDER_SITE_OTHER): Payer: Medicare HMO

## 2020-06-09 DIAGNOSIS — Z5181 Encounter for therapeutic drug level monitoring: Secondary | ICD-10-CM | POA: Diagnosis not present

## 2020-06-09 DIAGNOSIS — L97522 Non-pressure chronic ulcer of other part of left foot with fat layer exposed: Secondary | ICD-10-CM

## 2020-06-09 DIAGNOSIS — M2042 Other hammer toe(s) (acquired), left foot: Secondary | ICD-10-CM

## 2020-06-09 DIAGNOSIS — Z952 Presence of prosthetic heart valve: Secondary | ICD-10-CM

## 2020-06-09 DIAGNOSIS — Z7901 Long term (current) use of anticoagulants: Secondary | ICD-10-CM | POA: Diagnosis not present

## 2020-06-09 DIAGNOSIS — D689 Coagulation defect, unspecified: Secondary | ICD-10-CM

## 2020-06-09 DIAGNOSIS — M868X7 Other osteomyelitis, ankle and foot: Secondary | ICD-10-CM

## 2020-06-09 DIAGNOSIS — L02612 Cutaneous abscess of left foot: Secondary | ICD-10-CM

## 2020-06-09 DIAGNOSIS — L03032 Cellulitis of left toe: Secondary | ICD-10-CM

## 2020-06-09 LAB — POCT INR: INR: 2.6 (ref 2.0–3.0)

## 2020-06-09 NOTE — Progress Notes (Signed)
Subjective:  Patient ID: Virginia Franklin, female    DOB: May 28, 1929,  MRN: [id number]  Chief Complaint  Patient presents with   Wound Check    L foot, interdigital 1st and 2nd. Toe and forefoot are swollen. Pt's daughter stated, "I've been out-of-town. I'm not sure how long her foot has been swollen, but it was like that yesterday. She would be unaware if there was pus". They have not noticed an odor.    86 y.o. female presents with the above complaint. History confirmed with patient. She is referred to me by Dr. Elisha Ponder. She is here with her daughter, she lives in a retirement community in assisted living facility at [facility]. Her daughter lives about 30 minutes away. Her daughter saw her last week and the foot was fine, and then when she brought her for appointment today it was red swollen and had significant drainage. She has had a callus on the medial second toe for some time, she has previous seen Dr. Paulla Dolly for this. He developed a offloading toe spacer to wear which had been helping until now. Other than pain, redness, and swelling. She feels well and denies fever, chills, nausea, vomiting.  Objective:  Physical Exam: warm, good capillary refill, normal DP and PT pulses, normal sensory exam, ulceration at medial second PIPJ left foot and varicosities noted throughout the bilateral lower extremities without pitting edema. Left Foot: Moderate to severe hallux valgus is present with abutting second rigid hammertoe contracture, there is a ulceration on the medial PIPJ that is full-thickness with a granular base, no exposed bone, cellulitis and edema of the toe to the level of the MTPJ  Radiographs: X-ray of the left foot: Severe hallux valgus with end-stage arthritis of the first MTP joint is noted with digital contractures 2 through 5 with prominent adduction of the second proximal phalanx. On the medial side of the proximal phalanx head there is no obvious cortical destruction but  a potential area of early erosion noted. Assessment:   1. Ulcer of great toe, left, with fat layer exposed ([place])   2. Cellulitis and abscess of toe of left foot   3. Hammer toe of left foot      Plan:  Patient was evaluated and treated and all questions answered.   -She has significant foot deformity with hallux valgus and end-stage arthritis of the first MTP joint and abutting nearly overlapping second digit which is causing ulceration on the medial PIPJ. -She has significant medical comorbidities including chronic use of warfarin for an aortic valve replacement, as well as early dementia. -I would like to order an MRI to evaluate if there is osteomyelitis of the head of the second proximal phalanx -Discussed with her daughter and the patient that she continue with current local wound care and trying to offload the ulceration, however the is not successful surgical intervention may be required. They are hesitant to pursue surgery to understand this given her medical history. I discussed with him that the potential for a worsening ulceration and osteomyelitis development could include bone infection leading to need to completely amputate the toe, limb threatening infection, as well as bacteremia and life-threatening infections considering she has an aortic valve replacement. -Today we will try silicone tube caps to cover the hallux to offload the ulcer -Continue local wound care with mupirocin ointment and adhesive bandage -I will like to see her back in 1 week. -I will reach out to her primary care doctor Dr. Alysia Penna, as well  as her cardiologist Dr. Daneen Schick regarding potential for operative intervention. Surgically we discussed the following procedures: Resection arthroplasty of the proximal and potentially the middle phalanx of the second digit. This could be done under local anesthesia with or without light sedation  Lanae Crumbly, DPM 06/09/2020     Return in about 1 week  (around 06/15/2020) for wound re-check.

## 2020-06-09 NOTE — Telephone Encounter (Signed)
-----   Message from [name], DPM sent at 06/09/2020  7:56 AM EDT ----- Regarding: foot MRI Hi Val,  Can you order a left foot MRI for Virginia Franklin? Concern is for osteomyelitis of the 2nd toe, she has an ulceration on the medial proximal phalanx.  Thanks! Quita Skye

## 2020-06-09 NOTE — Telephone Encounter (Signed)
Faxed orders, clinicals and demographics to [facility] for pre-cert and scheduling. 

## 2020-06-09 NOTE — Patient Instructions (Signed)
Description   Continue on same dosage 1 tablet daily except for 1/2 tablet on Sundays and Thursdays. Recheck INR in 2 weeks. Call Coumadin clinic with questions [phone]. [name] fills pt's pill box, please call [phone]

## 2020-06-16 ENCOUNTER — Other Ambulatory Visit: Payer: Self-pay

## 2020-06-16 ENCOUNTER — Ambulatory Visit (INDEPENDENT_AMBULATORY_CARE_PROVIDER_SITE_OTHER): Payer: Medicare HMO | Admitting: Podiatry

## 2020-06-16 DIAGNOSIS — L03032 Cellulitis of left toe: Secondary | ICD-10-CM

## 2020-06-16 DIAGNOSIS — M79675 Pain in left toe(s): Secondary | ICD-10-CM

## 2020-06-16 DIAGNOSIS — L97522 Non-pressure chronic ulcer of other part of left foot with fat layer exposed: Secondary | ICD-10-CM | POA: Diagnosis not present

## 2020-06-16 DIAGNOSIS — L02612 Cutaneous abscess of left foot: Secondary | ICD-10-CM

## 2020-06-16 DIAGNOSIS — L84 Corns and callosities: Secondary | ICD-10-CM

## 2020-06-16 DIAGNOSIS — M79674 Pain in right toe(s): Secondary | ICD-10-CM

## 2020-06-16 DIAGNOSIS — B351 Tinea unguium: Secondary | ICD-10-CM

## 2020-06-16 DIAGNOSIS — M2042 Other hammer toe(s) (acquired), left foot: Secondary | ICD-10-CM | POA: Diagnosis not present

## 2020-06-16 NOTE — Progress Notes (Signed)
Subjective:  Patient ID: Virginia Franklin, female    DOB: Mar 14, 1929,  MRN: [id number]  Chief Complaint  Patient presents with  . Foot Ulcer    1 week wound check left interdigital1st and 2nd    86 y.o. female presents with the above complaint. History confirmed with patient.  She returns again today with her daughter.  Her daughter states that she has not been able to wear the toe caps, and has seem to have misplaced them.  She has ordered more.  She does present today with a different type of offloading device for the big toe, however she was wearing it on her second toe.  Her daughter states it also appears that the aide that is very to help her with medications a morning has not been able to consistently been able to put a dressing or bandage on this.  She still taking the clindamycin.  Objective:  Physical Exam: warm, good capillary refill, normal DP and PT pulses, normal sensory exam, ulceration at medial second PIPJ left foot and varicosities noted throughout the bilateral lower extremities without pitting edema.  Left Foot: Moderate to severe hallux valgus is present with abutting second rigid hammertoe contracture, there is a ulceration on the medial PIPJ that is full-thickness with a granular base, no exposed bone, cellulitis and edema has significantly improved.  Preulcerative callus tip of third digit.  Hammertoe contractures 2 through 5.  Onychomycosis x5  Right foot preulcerative callus tip of third digit, hammertoe contractures 2 through 5.  Onychomycosis x5   Assessment:   1. Cellulitis and abscess of toe of left foot   2. Hammer toe of left foot   3. Skin ulcer of second toe of left foot with fat layer exposed ([place])   4. Onychomycosis   5. Pain due to onychomycosis of toenails of both feet   6. Callus of foot   7. Pain in toes of both feet      Plan:  Patient was evaluated and treated and all questions answered.   -Today we again discussed treatment options  in detail regarding the ulceration of the second toe and rigid hammertoe that abuts her left hallux.  I discussed her case with Dr. Daneen Schick and Dr. Alysia Penna, her cardiologist and PCP, respectively, in regards to a potential planned procedure.  They are in agreement that she would be safe for the procedure.  We would likely do this under local anesthesia with a very light sedation.  I do not think that she necessarily have to stop her Coumadin for that procedure as long as her INR is below 2.2.  If we do have to stop it I will discuss with Dr. Tamala Julian.  Surgically we discussed the following procedure in detail: Arthroplasty of the head of the PIPJ of the proximal phalanx left second digit, excision of wound.  -MRI is scheduled for 07/05/2020, I think this would be valuable information determine if there is underlying infection  -I discussed with her daughter her living situation and increasing the amount of care that she has.  We are hopeful that the aide that comes to help with medications the morning can place the silicone toe protectors (she should have more to have around the house after they are delivered), as well as placing a Band-Aid with some antibiotic ointment on this.  Also discussed with her daughter that it may be worth considering having a nurse come more consistently or moving her to a higher level of assisted  care.  They will consider this.   -Continue using Bactroban ointment and adhesive bandage on the ulcer -Continue using offloading pads as able -Today she had preulcerative callus on the tips of each third digit, these were debrided as below.  All 10 nails were debrided to comfort for onychomycosis. -Regarding the preulcerative calluses on the tips of third toes the risk for also developing ulcerations.  Discussed with her daughter that if we proceed with surgery on the left second toe at the time I could perform a percutaneous flexor tenotomy's as well.  They will consider  this    Procedure: Nail Debridement Rationale: Patient meets criteria for routine foot care due to pain secondary to onychomycosis Type of Debridement: manual, sharp debridement. Instrumentation: Nail nipper, rotary burr. Number of Nails: 10   Procedure: Paring of Lesion Rationale: painful hyperkeratotic lesion Type of Debridement: manual, sharp debridement. Instrumentation: #15 blade Number of Lesions: 2, bilateral third digit   Return in 2 weeks for reevaluation, and then 2 weeks after that after the MRI is completed  Lanae Crumbly, DPM 06/16/2020     Return in about 2 weeks (around 06/30/2020).

## 2020-06-23 ENCOUNTER — Ambulatory Visit (INDEPENDENT_AMBULATORY_CARE_PROVIDER_SITE_OTHER): Payer: Medicare HMO | Admitting: *Deleted

## 2020-06-23 ENCOUNTER — Other Ambulatory Visit: Payer: Self-pay

## 2020-06-23 DIAGNOSIS — Z7901 Long term (current) use of anticoagulants: Secondary | ICD-10-CM | POA: Diagnosis not present

## 2020-06-23 DIAGNOSIS — Z5181 Encounter for therapeutic drug level monitoring: Secondary | ICD-10-CM | POA: Diagnosis not present

## 2020-06-23 DIAGNOSIS — Z952 Presence of prosthetic heart valve: Secondary | ICD-10-CM

## 2020-06-23 LAB — POCT INR: INR: 3.4 — AB (ref 2.0–3.0)

## 2020-06-23 NOTE — Patient Instructions (Signed)
Description   Tomorrow take 1/2 tablet then continue on same dosage 1 tablet daily except for 1/2 tablet on Sundays and Thursdays. Recheck INR in 2 weeks. Call Coumadin clinic with questions [phone]. [name] fills pt's pill box, please call [phone]

## 2020-06-28 ENCOUNTER — Other Ambulatory Visit: Payer: Self-pay | Admitting: Family Medicine

## 2020-06-30 ENCOUNTER — Ambulatory Visit: Payer: Medicare HMO | Admitting: Podiatry

## 2020-07-01 ENCOUNTER — Other Ambulatory Visit: Payer: Self-pay

## 2020-07-01 ENCOUNTER — Ambulatory Visit (INDEPENDENT_AMBULATORY_CARE_PROVIDER_SITE_OTHER): Payer: Medicare HMO | Admitting: Podiatry

## 2020-07-01 DIAGNOSIS — L03032 Cellulitis of left toe: Secondary | ICD-10-CM

## 2020-07-01 DIAGNOSIS — L97522 Non-pressure chronic ulcer of other part of left foot with fat layer exposed: Secondary | ICD-10-CM | POA: Diagnosis not present

## 2020-07-01 DIAGNOSIS — M2042 Other hammer toe(s) (acquired), left foot: Secondary | ICD-10-CM

## 2020-07-01 DIAGNOSIS — L02612 Cutaneous abscess of left foot: Secondary | ICD-10-CM | POA: Diagnosis not present

## 2020-07-02 NOTE — Progress Notes (Signed)
  Subjective:  Patient ID: Virginia Franklin, female    DOB: 1929/01/07,  MRN: [id number]  Chief Complaint  Patient presents with  . Wound Check    Pt denies fever/nausea/vomiting/chills. Pt states slight improvement.  . Callouses    Right foot painful corn.    86 y.o. female presents with the above complaint. History confirmed with patient.  She returns again today with her daughter.  She has been wearing the silicone sleeve on the hallux fairly regularly.  The nurse that comes in the morning to check her medications has been make sure that this is on as well as a Band-Aid and mupirocin ointment.  Left foot MRI scheduled for next week  Objective:  Physical Exam: warm, good capillary refill, normal DP and PT pulses, normal sensory exam, ulceration at medial second PIPJ left foot and varicosities noted throughout the bilateral lower extremities without pitting edema.  Left Foot: Moderate to severe hallux valgus is present with abutting second rigid hammertoe contracture, there is a ulceration on the medial PIPJ that is full-thickness with a granular base, no exposed bone, today it appears macerated and worsening appearance with darkening of the periwound skin edges and periwound erythema that does not extend to the MTPJ   Assessment:   1. Cellulitis and abscess of toe of left foot   2. Hammer toe of left foot   3. Skin ulcer of second toe of left foot with fat layer exposed ([place])      Plan:  Patient was evaluated and treated and all questions answered.   -Today we again discussed treatment options in detail regarding the ulceration of the second toe and rigid hammertoe that abuts her left hallux.  I discussed her case with Dr. Daneen Schick and Dr. Alysia Penna, her cardiologist and PCP, respectively, in regards to a potential planned procedure.  They are in agreement that she would be safe for the procedure.  We would likely do this under local anesthesia with a very light sedation.  I  do not think that she necessarily have to stop her Coumadin for that procedure as long as her INR is below 2.2.  If we do have to stop it I will discuss with Dr. Tamala Julian.  Surgically we discussed the following procedure in detail: Arthroplasty of the head of the PIPJ of the proximal phalanx left second digit, excision of wound. -The wound does appear to be worsening or at the very least not healing.  I recommended that we do plan to proceed with the surgical procedure as indicated following the MRI results -MRI is scheduled for 07/05/2020, I think this would be valuable information determine if there is underlying infection  -Follow-up in 1 week to discuss MRI and possible surgery

## 2020-07-05 ENCOUNTER — Ambulatory Visit
Admission: RE | Admit: 2020-07-05 | Discharge: 2020-07-05 | Disposition: A | Discharge status: Home / Self Care | Payer: Medicare HMO | Source: Ambulatory Visit | Attending: Podiatry | Admitting: Podiatry

## 2020-07-07 ENCOUNTER — Ambulatory Visit (INDEPENDENT_AMBULATORY_CARE_PROVIDER_SITE_OTHER): Payer: Medicare HMO

## 2020-07-07 ENCOUNTER — Other Ambulatory Visit: Payer: Self-pay

## 2020-07-07 DIAGNOSIS — Z952 Presence of prosthetic heart valve: Secondary | ICD-10-CM

## 2020-07-07 DIAGNOSIS — Z7901 Long term (current) use of anticoagulants: Secondary | ICD-10-CM | POA: Diagnosis not present

## 2020-07-07 DIAGNOSIS — Z5181 Encounter for therapeutic drug level monitoring: Secondary | ICD-10-CM

## 2020-07-07 LAB — POCT INR: INR: 3.4 — AB (ref 2.0–3.0)

## 2020-07-07 NOTE — Patient Instructions (Signed)
Description   Skip tomorrow's dosage of Warfarin, then start taking 1 tablet daily except for 1/2 tablet on Sundays, Tuesdays and Thursdays. Recheck INR in 2 weeks. Call Coumadin clinic with questions [phone]. [name] fills pt's pill box, please call [phone]

## 2020-07-14 ENCOUNTER — Other Ambulatory Visit: Payer: Self-pay

## 2020-07-14 ENCOUNTER — Ambulatory Visit (INDEPENDENT_AMBULATORY_CARE_PROVIDER_SITE_OTHER): Payer: Medicare HMO | Admitting: Podiatry

## 2020-07-14 DIAGNOSIS — M2041 Other hammer toe(s) (acquired), right foot: Secondary | ICD-10-CM | POA: Diagnosis not present

## 2020-07-14 DIAGNOSIS — M2012 Hallux valgus (acquired), left foot: Secondary | ICD-10-CM

## 2020-07-14 DIAGNOSIS — M2042 Other hammer toe(s) (acquired), left foot: Secondary | ICD-10-CM | POA: Diagnosis not present

## 2020-07-14 DIAGNOSIS — M2011 Hallux valgus (acquired), right foot: Secondary | ICD-10-CM

## 2020-07-14 DIAGNOSIS — M21611 Bunion of right foot: Secondary | ICD-10-CM

## 2020-07-14 DIAGNOSIS — M86172 Other acute osteomyelitis, left ankle and foot: Secondary | ICD-10-CM | POA: Diagnosis not present

## 2020-07-14 DIAGNOSIS — L84 Corns and callosities: Secondary | ICD-10-CM

## 2020-07-14 DIAGNOSIS — M21612 Bunion of left foot: Secondary | ICD-10-CM

## 2020-07-14 DIAGNOSIS — L97522 Non-pressure chronic ulcer of other part of left foot with fat layer exposed: Secondary | ICD-10-CM

## 2020-07-14 NOTE — Progress Notes (Signed)
Subjective:  Patient ID: Virginia Franklin, female    DOB: 1929/08/05,  MRN: [id number]  Chief Complaint  Patient presents with  . Foot Ulcer     wound ck-f/u after mri     86 y.o. female presents with the above complaint. History confirmed with patient.  She returns again today with her daughter.  They are here today for review of the MRI results and further treatment options.  Objective:  Physical Exam: warm, good capillary refill, normal DP and PT pulses, normal sensory exam, ulceration at medial second PIPJ left foot and varicosities noted throughout the bilateral lower extremities without pitting edema.  Left Foot: Moderate to severe hallux valgus is present with abutting second rigid hammertoe contracture, there is a ulceration on the medial PIPJ that is full-thickness with a granular base.  Digital contractures are present with preulcerative callus on the bilateral third toe tips.   Study Result  Narrative & Impression  CLINICAL DATA:  Second toe ulcer with swelling and redness of the dorsal forefoot.  EXAM: MRI OF THE LEFT FOOT WITHOUT CONTRAST  TECHNIQUE: Multiplanar, multisequence MR imaging of the left forefoot was performed. No intravenous contrast was administered.  COMPARISON:  Left foot x-rays dated June 08, 2020.  FINDINGS: Bones/Joint/Cartilage  Marrow edema is second proximal phalanx head with corresponding decreased T1 marrow signal, consistent with osteomyelitis. Remaining marrow signal is preserved. No fracture or dislocation. Moderate to severe first MTP joint osteoarthritis. Pes planus and mild talonavicular osteoarthritis. No joint effusion.  Ligaments  Collateral ligaments are intact.  Muscles and Tendons Flexor and extensor tendons are intact. Severe fatty atrophy of the intrinsic foot muscles.  Soft tissue Dorsal forefoot soft tissue swelling extending into the second toe. No fluid collection or hematoma. No soft tissue  mass.  IMPRESSION: 1. Osteomyelitis of the second proximal phalanx head.  No abscess. 2. Moderate to severe first MTP joint osteoarthritis.   Electronically Signed   By: Titus Dubin M.D.   On: 07/05/2020 13:35    Assessment:   1. Acute osteomyelitis of toe of left foot ([name])   2. Hammer toe of left foot   3. Hammertoe of right foot   4. Skin ulcer of second toe of left foot with fat layer exposed ([name])   5. Pre-ulcerative corn or callous   6. Hallux valgus with bunions, left   7. Hallux valgus with bunions, right      Plan:  Patient was evaluated and treated and all questions answered.   -Today we again discussed treatment options in detail regarding the ulceration of the second toe and now the diagnosis of osteomyelitis of the proximal phalanx distal portion.  I discussed with him that I would recommend surgical excision of the infected bone at the same time I can excise the wound.  We also discussed that the bilateral third toes have preulcerative calluses and would likely benefit from straightening of these as well.  Would like to be able to address this but do not think that a full incision hammertoe correction with arthrodesis or arthroplasty would be in her best interest that she would be in bilateral surgical shoes.  Do not want her to be a fall risk.  We will plan for the following surgical procedures: Arthroplasty with resection of infected bone of the second proximal phalanx left second toe, percutaneous flexor tenotomy of the left third toe, percutaneous flexor tenotomy of the right third toe.  -Discussed the risk, benefits, potential complications and postoperative course in  detail with patient and her daughter.  All questions were addressed.  Informed consent was reviewed and signed in the office.  Surgery is tentatively scheduled for 07/29/2020 at the [facility] surgery center.  -Communication sent to her PCP Dr. Alysia Penna as well as her cardiologist Dr. Daneen Schick regarding this operative plan and for coordinating her anticoagulation plan.  Follow-up after surgery   Surgical plan:  Procedure: -Left foot second toe PIPJ arthroplasty with wound excision, third toe percutaneous flexor tenotomy bilateral  Location: -[facility]  Anesthesia plan: -IV sedation with local anesthesia  Postoperative pain plan: -Tylenol 1000 mg every 6 hours, gabapentin 300 mg every 8 hours x5 days, oxycodone 5 mg 1-2 tabs every 6 hours only as needed  DVT prophylaxis: -We will discuss anticoagulant plan with her cardiologist  WB Restrictions / DME needs: -WBAT in surgical shoe, she has 1 at home that she will bring to surgery.

## 2020-07-14 NOTE — H&P (View-Only) (Signed)
Subjective:  Patient ID: Virginia Franklin, female    DOB: Jun 02, 1929,  MRN: [id number]  Chief Complaint  Patient presents with  . Foot Ulcer     wound ck-f/u after mri     86 y.o. female presents with the above complaint. History confirmed with patient.  She returns again today with her daughter.  They are here today for review of the MRI results and further treatment options.  Objective:  Physical Exam: warm, good capillary refill, normal DP and PT pulses, normal sensory exam, ulceration at medial second PIPJ left foot and varicosities noted throughout the bilateral lower extremities without pitting edema.  Left Foot: Moderate to severe hallux valgus is present with abutting second rigid hammertoe contracture, there is a ulceration on the medial PIPJ that is full-thickness with a granular base.  Digital contractures are present with preulcerative callus on the bilateral third toe tips.   Study Result  Narrative & Impression  CLINICAL DATA:  Second toe ulcer with swelling and redness of the dorsal forefoot.  EXAM: MRI OF THE LEFT FOOT WITHOUT CONTRAST  TECHNIQUE: Multiplanar, multisequence MR imaging of the left forefoot was performed. No intravenous contrast was administered.  COMPARISON:  Left foot x-rays dated June 08, 2020.  FINDINGS: Bones/Joint/Cartilage  Marrow edema is second proximal phalanx head with corresponding decreased T1 marrow signal, consistent with osteomyelitis. Remaining marrow signal is preserved. No fracture or dislocation. Moderate to severe first MTP joint osteoarthritis. Pes planus and mild talonavicular osteoarthritis. No joint effusion.  Ligaments  Collateral ligaments are intact.  Muscles and Tendons Flexor and extensor tendons are intact. Severe fatty atrophy of the intrinsic foot muscles.  Soft tissue Dorsal forefoot soft tissue swelling extending into the second toe. No fluid collection or hematoma. No soft tissue  mass.  IMPRESSION: 1. Osteomyelitis of the second proximal phalanx head.  No abscess. 2. Moderate to severe first MTP joint osteoarthritis.   Electronically Signed   By: Titus Dubin M.D.   On: 07/05/2020 13:35    Assessment:   1. Acute osteomyelitis of toe of left foot ([place])   2. Hammer toe of left foot   3. Hammertoe of right foot   4. Skin ulcer of second toe of left foot with fat layer exposed ([place])   5. Pre-ulcerative corn or callous   6. Hallux valgus with bunions, left   7. Hallux valgus with bunions, right      Plan:  Patient was evaluated and treated and all questions answered.   -Today we again discussed treatment options in detail regarding the ulceration of the second toe and now the diagnosis of osteomyelitis of the proximal phalanx distal portion.  I discussed with him that I would recommend surgical excision of the infected bone at the same time I can excise the wound.  We also discussed that the bilateral third toes have preulcerative calluses and would likely benefit from straightening of these as well.  Would like to be able to address this but do not think that a full incision hammertoe correction with arthrodesis or arthroplasty would be in her best interest that she would be in bilateral surgical shoes.  Do not want her to be a fall risk.  We will plan for the following surgical procedures: Arthroplasty with resection of infected bone of the second proximal phalanx left second toe, percutaneous flexor tenotomy of the left third toe, percutaneous flexor tenotomy of the right third toe.  -Discussed the risk, benefits, potential complications and postoperative course in  detail with patient and her daughter.  All questions were addressed.  Informed consent was reviewed and signed in the office.  Surgery is tentatively scheduled for 07/29/2020 at the [facility] surgery center.  -Communication sent to her PCP Dr. Alysia Penna as well as her cardiologist Dr. Daneen Schick regarding this operative plan and for coordinating her anticoagulation plan.  Follow-up after surgery   Surgical plan:  Procedure: -Left foot second toe PIPJ arthroplasty with wound excision, third toe percutaneous flexor tenotomy bilateral  Location: -[place] [facility] center  Anesthesia plan: -IV sedation with local anesthesia  Postoperative pain plan: -Tylenol 1000 mg every 6 hours, gabapentin 300 mg every 8 hours x5 days, oxycodone 5 mg 1-2 tabs every 6 hours only as needed  DVT prophylaxis: -We will discuss anticoagulant plan with her cardiologist  WB Restrictions / DME needs: -WBAT in surgical shoe, she has 1 at home that she will bring to surgery.

## 2020-07-18 ENCOUNTER — Other Ambulatory Visit: Payer: Self-pay | Admitting: Family Medicine

## 2020-07-19 ENCOUNTER — Telehealth: Payer: Self-pay | Admitting: Pharmacist

## 2020-07-19 ENCOUNTER — Other Ambulatory Visit: Payer: Self-pay | Admitting: Family Medicine

## 2020-07-19 NOTE — Telephone Encounter (Signed)
Please advise. Rx last filled by a historical provider.

## 2020-07-19 NOTE — Telephone Encounter (Signed)
Next INR check is scheduled on 9/9 - will coordinate Lovenox bridge at that appt.

## 2020-07-19 NOTE — Telephone Encounter (Signed)
Virginia Crome, MD  [name], DPM; [name], MD; Virginia Crome, MD; Loren Racer, RN; Leeroy Bock, RPH-CPP Unless new CV complaints, she will be cleared from CV stand point. Will need Lovenox Bridge. INR target is 2.0 pre-surgery.       Previous Messages   ----- Message -----  From: [name], DPM  Sent: 07/14/2020 12:21 PM EDT  To: Virginia Crome, MD, [name], MD   Hi Dr Tamala Julian and Dr Sarajane Jews, wanted to update you that the MRI on [name]'s left foot showed osteomyelitis of the proximal phalanx of the 2nd toe. We are planning for surgical resection/arthroplasty and will excise the wound along with some percutaneous tendon releases to prevent ulcers forming on the 3rd toes. I let them know to contact your offices to schedule a pre op H&P as well as an [facility] plan for the coumadin. My goal INR for this would be around 2.0 if possible. I believe she has previously been on Lovenox bridging for previous procedures. Please let me know if you have any questions and thank you for all your assitance!   Lanae Crumbly, DPM

## 2020-07-20 ENCOUNTER — Telehealth: Payer: Self-pay | Admitting: Family Medicine

## 2020-07-20 NOTE — Telephone Encounter (Signed)
[facility] Clearance/History and Physical form to be filled out- placed in dr's folder.  Fax to [phone] upon completion.

## 2020-07-21 ENCOUNTER — Ambulatory Visit (INDEPENDENT_AMBULATORY_CARE_PROVIDER_SITE_OTHER): Payer: Medicare HMO

## 2020-07-21 ENCOUNTER — Other Ambulatory Visit: Payer: Self-pay

## 2020-07-21 DIAGNOSIS — Z7901 Long term (current) use of anticoagulants: Secondary | ICD-10-CM | POA: Diagnosis not present

## 2020-07-21 DIAGNOSIS — Z5181 Encounter for therapeutic drug level monitoring: Secondary | ICD-10-CM | POA: Diagnosis not present

## 2020-07-21 DIAGNOSIS — Z952 Presence of prosthetic heart valve: Secondary | ICD-10-CM

## 2020-07-21 LAB — POCT INR: INR: 4.1 — AB (ref 2.0–3.0)

## 2020-07-21 MED ORDER — ENOXAPARIN SODIUM 60 MG/0.6ML ~~LOC~~ SOLN: 60.0000 mg | Freq: Two times a day (BID) | SUBCUTANEOUS | 0 refills | Status: DC

## 2020-07-21 NOTE — Telephone Encounter (Signed)
The form is ready to fax  °

## 2020-07-21 NOTE — Patient Instructions (Addendum)
Description   Skip tomorrow's dosage of Warfarin, then start taking 1/2 tablet daily except for 1 tablet on Mondays, Wednesdays and Fridays.  Start holding Warfarin on 07/26/20 prior to procedure on 07/29/20.  See Lovenox bridging instructions.  Recheck on Monday after procedure 08/01/20. Call Coumadin clinic with questions [phone]. [name] fills pt's pill box, please call [phone]    07/25/20: Last dose of Coumadin.  07/26/20: No Coumadin or Lovenox.  07/27/20: Inject Lovenox 60mg  in the fatty abdominal tissue at least 2 inches from the belly button twice a day about 12 hours apart, 8am and 8pm rotate sites. No Coumadin.  07/28/20: Inject Lovenox in the fatty tissue in the morning at 8 am (No PM dose). No Coumadin.  07/29/20: Procedure Day - No Lovenox - Resume Coumadin in the evening or as directed by doctor (take an extra half tablet with usual dose for 2 days then resume normal dose).  07/30/20: Resume Lovenox inject in the fatty tissue every 12 hours and take Coumadin.  07/31/20: Inject Lovenox in the fatty tissue every 12 hours and take Coumadin.  08/01/20: Inject Lovenox in the fatty tissue every 12 hours and take Coumadin. Coumadin appt to check INR.

## 2020-07-22 ENCOUNTER — Telehealth: Payer: Self-pay

## 2020-07-22 NOTE — Telephone Encounter (Signed)
DOS 07/29/2020  HAMMERTOE REPAIR 2ND LT - 82956 TENDON RELEASE 3RD B/L - 28011  [employer]ET[employer] MEDICARE EFFECTIVE DATE - 11/12/2016  PLAN DEDUCTIBLE - $0.00 OUT OF POCKET - $0.00 COPAY $0.00 COINSURANCE - $100.00  PER AUTOMATED SYSTEM NO PRECERT REQUIRED FOR [place] [address] [place] [address]  CALL REF # [phone]

## 2020-07-22 NOTE — Telephone Encounter (Signed)
The form has been faxed to [facility].

## 2020-07-22 NOTE — Progress Notes (Signed)
Received pt's pcp , Dr Alysia Penna, H&P via fax from Dr Sherryle Lis office.  Placed in chart.

## 2020-07-25 ENCOUNTER — Encounter (HOSPITAL_BASED_OUTPATIENT_CLINIC_OR_DEPARTMENT_OTHER): Payer: Self-pay | Admitting: Podiatry

## 2020-07-25 ENCOUNTER — Other Ambulatory Visit: Payer: Self-pay

## 2020-07-25 NOTE — Progress Notes (Addendum)
Addendum : spoke with jessica zanetto pa ordered pt/inr for pre op labs tomorrow, order placed in epic   Spoke w/ via phone for pre-op interview---Patient daughter  [name] Lab needs dos---- I stat 8, pt              COVID test ------rapid covid day of surgery arrive 530 am 07-29-2020 Arrive at -------530 am 07-29-2020 [facility] NPO after MN NO Solid Food. Water until 630 am then npo Medications to take morning of surgery ----- Diabetic medication -----n/a Patient Special Instructions ----- Pre-Op special Istructions ----- Patient verbalized understanding of instructions that were given at this phone interview. Patient denies shortness of breath, chest pain, fever, cough at this phone interview.  Anesthesia Review: orders for anesthesia review per dr Sherryle Lis podiatry , pt has coumadin stopping instructions and lovenox bridge instructions  PCP: dr Cherlynn Perches 07-21-2020 Cardiologist :dr Daneen Schick cardiac clearance note epic 07-19-2020 epic/chart Chest x-ray :none EKG : 3-9-201  epic Echo :09-07-2020 epic Stress test:none Cardiac Cath : none Activity level: walks with walker and needs minimal assist onto strecher Sleep Study/ CPAP : none Fasting Blood Sugar :      / Checks Blood Sugar -- times a day:  N/a Neurology lov dr Delice Lesch 04-06-2020 epic Blood Thinner/ Instructions Maryjane Hurter Dose: per 07-21-2020 lov note coumadin clinic dr Tamala Julian  last dose of coumadin 07-26-2020 , lovenox bridge 60 mg bid on 07-27-2020 bid, inject lovenox 60 mg at 800 am no pm dose on 07-28-2020 ASA / Instructions/ Last Dose : n/a  h & p /clearance dr Annie Main fry 07-21-2020 on chart lov neurology dr Delice Lesch   Patient walks with walker and minimal assist Has alzheimers signs own consents per daughter rebecca craig , no formal poa , daughter will be driving [employer] and calling front office when arriving   Addendum: spoke with daughter rebecca craig and she has emergency call into dr Sherryle Lis wound looks worse  per daughter.medication record completed after received by jared at [facility] and  Emailed  morning of surgery medications to be taken to jared.confirmation email received and placed on chart, fax number did not work

## 2020-07-26 ENCOUNTER — Other Ambulatory Visit (HOSPITAL_COMMUNITY): Payer: Medicare HMO

## 2020-07-26 ENCOUNTER — Telehealth: Payer: Self-pay | Admitting: Podiatry

## 2020-07-26 ENCOUNTER — Telehealth: Payer: Self-pay | Admitting: Pharmacist

## 2020-07-26 NOTE — Telephone Encounter (Signed)
Received a voicemail from [place] asking for clarification for Virginia Franklin warfarin dose after surgery. Appears patient threw away hand out we had given. Called and LVM for Jared to call back. Patient is supposed to hold warfarin 9/14, 9/15, 9/16 and then take 1.5 tablets (11.25mg ) on 9/17, 1 tablet (7.5mg ) on 9/18 and 1/2 tablet (3.75mg ) on 9/19. INR check on 9/20. Will need to confirm patient still has bridge instructions

## 2020-07-26 NOTE — Telephone Encounter (Signed)
Patients daughter called and stated that  mother was not suppose to put under general anesthesia and she is confused. Please give daughter. a call ASAP

## 2020-07-28 NOTE — Anesthesia Preprocedure Evaluation (Addendum)
Anesthesia Evaluation  Patient identified by MRN, date of birth, ID band Patient awake    Reviewed: Allergy & Precautions, NPO status , Patient's Chart, lab work & pertinent test results  Airway Mallampati: II  TM Distance: >3 FB Neck ROM: Full    Dental no notable dental hx.    Pulmonary neg pulmonary ROS,    Pulmonary exam normal breath sounds clear to auscultation       Cardiovascular hypertension,  Rhythm:Regular Rate:Normal + Systolic murmurs INR target per surgeon and pharmacy is 2.0. Currently on a lovenox bridge.   Neuro/Psych Dementia CVA    GI/Hepatic negative GI ROS, Neg liver ROS,   Endo/Other  negative endocrine ROS  Renal/GU negative Renal ROS  negative genitourinary   Musculoskeletal negative musculoskeletal ROS (+)   Abdominal   Peds negative pediatric ROS (+)  Hematology negative hematology ROS (+)   Anesthesia Other Findings   Reproductive/Obstetrics negative OB ROS                            Anesthesia Physical Anesthesia Plan  ASA: III  Anesthesia Plan: MAC   Post-op Pain Management:    Induction: Intravenous  PONV Risk Score and Plan: 1 and Ondansetron  Airway Management Planned: Simple Face Mask  Additional Equipment:   Intra-op Plan:   Post-operative Plan:   Informed Consent: I have reviewed the patients History and Physical, chart, labs and discussed the procedure including the risks, benefits and alternatives for the proposed anesthesia with the patient or authorized representative who has indicated his/her understanding and acceptance.     Dental advisory given  Plan Discussed with: CRNA and Surgeon  Anesthesia Plan Comments: (Needs coags DOS. Different coumadin stop date instructions noted in chart. Target INR is 2.0 per surgeon and pharmacy. Norton Blizzard, MD  )       Anesthesia Quick Evaluation

## 2020-07-29 ENCOUNTER — Ambulatory Visit (HOSPITAL_COMMUNITY): Payer: Medicare HMO

## 2020-07-29 ENCOUNTER — Encounter (HOSPITAL_BASED_OUTPATIENT_CLINIC_OR_DEPARTMENT_OTHER): Payer: Self-pay | Admitting: Podiatry

## 2020-07-29 ENCOUNTER — Ambulatory Visit (HOSPITAL_BASED_OUTPATIENT_CLINIC_OR_DEPARTMENT_OTHER): Payer: Medicare HMO | Admitting: Anesthesiology

## 2020-07-29 ENCOUNTER — Ambulatory Visit (HOSPITAL_BASED_OUTPATIENT_CLINIC_OR_DEPARTMENT_OTHER)
Admission: RE | Admit: 2020-07-29 | Discharge: 2020-07-29 | Disposition: A | Discharge status: Home / Self Care | Payer: Medicare HMO | Attending: Podiatry | Admitting: Podiatry

## 2020-07-29 ENCOUNTER — Encounter (HOSPITAL_BASED_OUTPATIENT_CLINIC_OR_DEPARTMENT_OTHER)
Admission: RE | Disposition: A | Discharge status: Home / Self Care | Payer: Self-pay | Source: Home / Self Care | Attending: Podiatry

## 2020-07-29 ENCOUNTER — Other Ambulatory Visit: Payer: Self-pay

## 2020-07-29 DIAGNOSIS — M2042 Other hammer toe(s) (acquired), left foot: Secondary | ICD-10-CM | POA: Insufficient documentation

## 2020-07-29 DIAGNOSIS — M19072 Primary osteoarthritis, left ankle and foot: Secondary | ICD-10-CM | POA: Insufficient documentation

## 2020-07-29 DIAGNOSIS — M86671 Other chronic osteomyelitis, right ankle and foot: Secondary | ICD-10-CM

## 2020-07-29 DIAGNOSIS — M86672 Other chronic osteomyelitis, left ankle and foot: Secondary | ICD-10-CM

## 2020-07-29 DIAGNOSIS — S91105A Unspecified open wound of left lesser toe(s) without damage to nail, initial encounter: Secondary | ICD-10-CM | POA: Insufficient documentation

## 2020-07-29 DIAGNOSIS — Z20822 Contact with and (suspected) exposure to covid-19: Secondary | ICD-10-CM | POA: Insufficient documentation

## 2020-07-29 DIAGNOSIS — F0390 Unspecified dementia without behavioral disturbance: Secondary | ICD-10-CM | POA: Diagnosis not present

## 2020-07-29 DIAGNOSIS — R011 Cardiac murmur, unspecified: Secondary | ICD-10-CM | POA: Diagnosis not present

## 2020-07-29 DIAGNOSIS — Z9889 Other specified postprocedural states: Secondary | ICD-10-CM

## 2020-07-29 DIAGNOSIS — Z8673 Personal history of transient ischemic attack (TIA), and cerebral infarction without residual deficits: Secondary | ICD-10-CM | POA: Diagnosis not present

## 2020-07-29 DIAGNOSIS — L84 Corns and callosities: Secondary | ICD-10-CM | POA: Diagnosis not present

## 2020-07-29 DIAGNOSIS — I10 Essential (primary) hypertension: Secondary | ICD-10-CM | POA: Insufficient documentation

## 2020-07-29 DIAGNOSIS — M2012 Hallux valgus (acquired), left foot: Secondary | ICD-10-CM | POA: Insufficient documentation

## 2020-07-29 HISTORY — DX: Unspecified osteoarthritis, unspecified site: M19.90

## 2020-07-29 HISTORY — PX: INCISION AND DRAINAGE: SHX5863

## 2020-07-29 HISTORY — PX: HAMMER TOE SURGERY: SHX385

## 2020-07-29 HISTORY — DX: Fatigue fracture of vertebra, lumbar region, initial encounter for fracture: M48.46XA

## 2020-07-29 HISTORY — DX: Other hammer toe(s) (acquired), left foot: M20.42

## 2020-07-29 LAB — POCT I-STAT, CHEM 8
BUN: 20 mg/dL (ref 8–23)
Calcium, Ion: 1.27 mmol/L (ref 1.15–1.40)
Chloride: 104 mmol/L (ref 98–111)
Creatinine, Ser: 0.7 mg/dL (ref 0.44–1.00)
Glucose, Bld: 96 mg/dL (ref 70–99)
HCT: 40 % (ref 36.0–46.0)
Hemoglobin: 13.6 g/dL (ref 12.0–15.0)
Potassium: 4 mmol/L (ref 3.5–5.1)
Sodium: 142 mmol/L (ref 135–145)
TCO2: 27 mmol/L (ref 22–32)

## 2020-07-29 LAB — PROTIME-INR
INR: 1.1 (ref 0.8–1.2)
Prothrombin Time: 14.2 seconds (ref 11.4–15.2)

## 2020-07-29 LAB — SARS CORONAVIRUS 2 BY RT PCR (HOSPITAL ORDER, PERFORMED IN ~~LOC~~ HOSPITAL LAB): SARS Coronavirus 2: NEGATIVE

## 2020-07-29 SURGERY — CORRECTION, HAMMER TOE
Anesthesia: Monitor Anesthesia Care | Site: Toe | Laterality: Left

## 2020-07-29 MED ORDER — CHLORHEXIDINE GLUCONATE CLOTH 2 % EX PADS: 6.0000 | MEDICATED_PAD | Freq: Once | CUTANEOUS | Status: DC

## 2020-07-29 MED ORDER — PROPOFOL 10 MG/ML IV BOLUS
INTRAVENOUS | Status: DC | PRN
  Administered 2020-07-29 (×3): 10 mg via INTRAVENOUS

## 2020-07-29 MED ORDER — LACTATED RINGERS IV SOLN: INTRAVENOUS | Status: DC

## 2020-07-29 MED ORDER — TRAMADOL HCL 50 MG PO TABS: 50.0000 mg | ORAL_TABLET | Freq: Four times a day (QID) | ORAL | 0 refills | Status: AC | PRN

## 2020-07-29 MED ORDER — PROPOFOL 500 MG/50ML IV EMUL
INTRAVENOUS | Status: DC | PRN
  Administered 2020-07-29: 25 ug/kg/min via INTRAVENOUS

## 2020-07-29 MED ORDER — FENTANYL CITRATE (PF) 100 MCG/2ML IJ SOLN
INTRAMUSCULAR | Status: DC | PRN
Start: 2020-07-29 — End: 2020-07-29
  Administered 2020-07-29 (×3): 25 ug via INTRAVENOUS

## 2020-07-29 MED ORDER — CLINDAMYCIN PHOSPHATE 600 MG/50ML IV SOLN
INTRAVENOUS | Status: AC
  Filled 2020-07-29: qty 50

## 2020-07-29 MED ORDER — EPHEDRINE SULFATE-NACL 50-0.9 MG/10ML-% IV SOSY
PREFILLED_SYRINGE | INTRAVENOUS | Status: DC | PRN
  Administered 2020-07-29 (×2): 10 mg via INTRAVENOUS

## 2020-07-29 MED ORDER — LIDOCAINE HCL 2 % IJ SOLN
INTRAMUSCULAR | Status: DC | PRN
  Administered 2020-07-29: 20 mL

## 2020-07-29 MED ORDER — FENTANYL CITRATE (PF) 100 MCG/2ML IJ SOLN
INTRAMUSCULAR | Status: AC
  Filled 2020-07-29: qty 2

## 2020-07-29 MED ORDER — ACETAMINOPHEN 500 MG PO TABS
ORAL_TABLET | ORAL | Status: AC
  Filled 2020-07-29: qty 2

## 2020-07-29 MED ORDER — ACETAMINOPHEN 500 MG PO TABS: 1000.0000 mg | ORAL_TABLET | Freq: Four times a day (QID) | ORAL | 0 refills | Status: AC | PRN

## 2020-07-29 MED ORDER — CLINDAMYCIN HCL 300 MG PO CAPS: 300.0000 mg | ORAL_CAPSULE | Freq: Three times a day (TID) | ORAL | 0 refills | Status: DC

## 2020-07-29 MED ORDER — ACETAMINOPHEN 500 MG PO TABS
1000.0000 mg | ORAL_TABLET | Freq: Once | ORAL | Status: AC
  Administered 2020-07-29: 1000 mg via ORAL

## 2020-07-29 MED ORDER — BUPIVACAINE HCL (PF) 0.5 % IJ SOLN
INTRAMUSCULAR | Status: DC | PRN
  Administered 2020-07-29: 20 mL

## 2020-07-29 SURGICAL SUPPLY — 59 items
APL PRP STRL LF DISP 70% ISPRP (MISCELLANEOUS)
BANDAGE ESMARK 6X9 LF (GAUZE/BANDAGES/DRESSINGS) ×2 IMPLANT
BLADE AVERAGE 25X9 (BLADE) IMPLANT
BLADE MINI RND TIP GREEN BEAV (BLADE) IMPLANT
BLADE OSC/SAG .038X5.5 CUT EDG (BLADE) IMPLANT
BLADE SURG 15 STRL LF DISP TIS (BLADE) ×4 IMPLANT
BLADE SURG 15 STRL SS (BLADE) ×6
BNDG CMPR 9X4 STRL LF SNTH (GAUZE/BANDAGES/DRESSINGS) ×2
BNDG CMPR 9X6 STRL LF SNTH (GAUZE/BANDAGES/DRESSINGS) ×2
BNDG COHESIVE 1X5 TAN NS LF (GAUZE/BANDAGES/DRESSINGS) ×3 IMPLANT
BNDG CONFORM 2 STRL LF (GAUZE/BANDAGES/DRESSINGS) ×3 IMPLANT
BNDG ELASTIC 4X5.8 VLCR STR LF (GAUZE/BANDAGES/DRESSINGS) IMPLANT
BNDG ESMARK 4X9 LF (GAUZE/BANDAGES/DRESSINGS) ×3 IMPLANT
BNDG ESMARK 6X9 LF (GAUZE/BANDAGES/DRESSINGS) ×3
BNDG GAUZE ELAST 4 BULKY (GAUZE/BANDAGES/DRESSINGS) IMPLANT
Bilateral Limb Drape ×3 IMPLANT
CHLORAPREP W/TINT 26 (MISCELLANEOUS) IMPLANT
CNTNR URN SCR LID CUP LEK RST (MISCELLANEOUS) ×2 IMPLANT
CONT SPEC 4OZ STRL OR WHT (MISCELLANEOUS) ×3
COVER BACK TABLE 60X90IN (DRAPES) ×3 IMPLANT
COVER WAND RF STERILE (DRAPES) ×3 IMPLANT
CUFF TOURN SGL QUICK 18X4 (TOURNIQUET CUFF) IMPLANT
DECANTER SPIKE VIAL GLASS SM (MISCELLANEOUS) IMPLANT
DRAIN PENROSE 0.25X18 (DRAIN) ×6 IMPLANT
DRAPE 3/4 80X56 (DRAPES) ×3 IMPLANT
DRAPE C-ARM 35X43 STRL (DRAPES) ×3 IMPLANT
DRAPE EXTREMITY T 121X128X90 (DISPOSABLE) IMPLANT
DRAPE U-SHAPE 47X51 STRL (DRAPES) ×3 IMPLANT
ELECT REM PT RETURN 9FT ADLT (ELECTROSURGICAL) ×3
ELECTRODE REM PT RTRN 9FT ADLT (ELECTROSURGICAL) ×2 IMPLANT
GAUZE SPONGE 4X4 12PLY STRL (GAUZE/BANDAGES/DRESSINGS) ×3 IMPLANT
GAUZE SPONGE 4X4 12PLY STRL LF (GAUZE/BANDAGES/DRESSINGS) ×3 IMPLANT
GAUZE XEROFORM 1X8 LF (GAUZE/BANDAGES/DRESSINGS) ×3 IMPLANT
GLOVE BIOGEL M 7.0 STRL (GLOVE) ×3 IMPLANT
GLOVE BIOGEL PI IND STRL 7.5 (GLOVE) ×2 IMPLANT
GLOVE BIOGEL PI INDICATOR 7.5 (GLOVE) ×1
GOWN STRL REUS W/ TWL LRG LVL3 (GOWN DISPOSABLE) ×2 IMPLANT
GOWN STRL REUS W/TWL LRG LVL3 (GOWN DISPOSABLE) ×3
K-WIRE CAPS STERILE WHITE .045 (WIRE) IMPLANT
K-WIRE DBL END TROCAR 6X.045 (WIRE)
KWIRE DBL END TROCAR 6X.045 (WIRE) IMPLANT
NDL SAFETY ECLIPSE 18X1.5 (NEEDLE) ×2 IMPLANT
NEEDLE HYPO 18GX1.5 SHARP (NEEDLE) ×3
NEEDLE HYPO 25X1 1.5 SAFETY (NEEDLE) ×3 IMPLANT
NS IRRIG 1000ML POUR BTL (IV SOLUTION) IMPLANT
PACK BASIN DAY SURGERY FS (CUSTOM PROCEDURE TRAY) ×3 IMPLANT
PENCIL SMOKE EVACUATOR (MISCELLANEOUS) ×3 IMPLANT
SUCTION FRAZIER HANDLE 10FR (MISCELLANEOUS) ×1
SUCTION TUBE FRAZIER 10FR DISP (MISCELLANEOUS) ×2 IMPLANT
SUT ETHILON 4 0 PS 2 18 (SUTURE) ×6 IMPLANT
SUT MNCRL AB 3-0 PS2 18 (SUTURE) ×3 IMPLANT
SUT MNCRL AB 4-0 PS2 18 (SUTURE) ×3 IMPLANT
SUT VIC AB 2-0 SH 27 (SUTURE)
SUT VIC AB 2-0 SH 27XBRD (SUTURE) IMPLANT
SYR 20ML LL LF (SYRINGE) ×3 IMPLANT
SYR BULB EAR ULCER 3OZ GRN STR (SYRINGE) ×3 IMPLANT
TRAY DSU PREP LF (CUSTOM PROCEDURE TRAY) ×3 IMPLANT
TUBE CONNECTING 12X1/4 (SUCTIONS) ×3 IMPLANT
UNDERPAD 30X36 HEAVY ABSORB (UNDERPADS AND DIAPERS) ×6 IMPLANT

## 2020-07-29 NOTE — Progress Notes (Signed)
Spoke with Lourena Simmonds, RN in [place] with Dr. Sherryle Lis to clarify start of lovenox and coumadin.  Per Dr. Sherryle Lis, patient should follow cardiologist instructions for both lovenox and coumadin.  Patient and her dtr, [name], informed of this and voiced understanding.

## 2020-07-29 NOTE — Op Note (Signed)
Patient Name: Virginia Franklin DOB: [birth year]  MRN: [id number]   Date of Service: 07/29/2020  Surgeon: Dr. Lanae Crumbly, DPM Assistants: None Pre-operative Diagnosis:  1.  Chronic osteomyelitis left second toe 2.  Hammertoe contracture left second, third, right third toes 3.  Hallux valgus left foot 4.  Chronic wound of left second toe 5.  Bilateral preulcerative calluses Post-operative Diagnosis:  1.  Chronic osteomyelitis left second toe 2.  Hammertoe contracture left second, third, right third toes 3.  Hallux valgus left foot 4.  Chronic wound of left second toe 5.  Bilateral preulcerative calluses Procedures: 1.  Arthroplasty PIPJ left second toe 2.  Flexor digitorum longus tenotomy bilateral third digits Pathology/Specimens: ID Type Source Tests Collected by Time Destination  1 : proximal phalynx left second toe Tissue PATH Soft tissue SURGICAL PATHOLOGY [name], DPM 07/29/2020 4098   A : Proximal phalynx left second toe Tissue Wound GRAM STAIN (Canceled), AEROBIC/ANAEROBIC CULTURE (SURGICAL/DEEP WOUND) [name], DPM 07/29/2020 1008    Anesthesia: IV sedation with local anesthesia Hemostasis: Esmarch compression tourniquet left ankle, pin raise compression tourniquet right third toe Estimated Blood Loss: 10 cc Materials: * No implants in log * Medications: 17 cc local anesthetic in digital block fashion of a 50-50 mix of the 2% Xylocaine plain and 0.5% Marcaine plain Complications: None  Indications for Procedure:  This is a 86 y.o. female with a history of bilateral digital contractures with ulceration of the medial second digit as it abuts the left hallux.  She also has preulcerative calluses of the bilateral third digits with semirigid hammertoe contractures.  Preoperative MRI revealed osteomyelitis of the head of the proximal phalanx of the left second toe, and I recommended arthroplasty resection of the head of the proximal phalanx to treat the  osteomyelitis as well as the hammertoe deformity and simultaneous flexor tenotomy of the bilateral third digits 2 alleviate pressure on preulcerative calluses before they became infected.   Procedure in Detail: Patient was identified in pre-operative holding area. Formal consent was signed and the bilateral lower extremities were marked. Patient was brought back to the operating room. Anesthesia was induced. The extremity was prepped and draped in the usual sterile fashion. Timeout was taken to confirm patient name and procedure prior to incision.   Attention was then directed to the left second digit where a chronic ulceration that extended to the level of the head of the proximal phalanx was present on the medial toe.  A semielliptical incision was made to excise the ulceration.  This is carried to the level of subcutaneous tissue and the skin wedge was excised.  Care was taken to avoid the digital neurovascular bundle plantarly and dorsally.  The incision medially was carried to the level of the bone with sharp and blunt dissection to expose the head of the proximal phalanx and the PIPJ.  The medial collateral ligament was released from the PIPJ.  The distal half of the proximal phalanx was then resected using a sharp sharp bone cutting forceps and the lateral collateral ligament was released.  The bone was then excised.    A portion of the excised bone was sent for microbiologic culture and the remainder was sent to histopathology. During excision of this portion the patient kicked her foot and the dorsal skin bridge was lacerated with the #15 blade.  Unfortunately due to her age and medical comorbidities we will try to keep her sedation as light as possible and this likely caused this,  however it was successfully repaired. The wound was thoroughly irrigated with normal sterile saline and closure was initiated.  Closure was completed with 3-0 Monocryl and 4-0 nylon.  I then directed my attention to the  bilateral third digits which had semiflexible hammertoe contractures.  Using a sharp 18-gauge needle a percutaneous incision was made to the plantar toe to the level of the middle phalanx.  The flexor digitorum longus tendon was then successfully released and the DIPJ was dorsiflexed to release the contracture.  The foot was then dressed with Xeroform, 4 x 4 gauze, Kling bandage, and Coban. Patient tolerated the procedure well.   Disposition: Following a period of post-operative monitoring, patient will be transferred to home.  She will be weightbearing as tolerated bilaterally and surgical shoes.

## 2020-07-29 NOTE — Discharge Instructions (Signed)
Post-Surgery Instructions  1. If you are recuperating from surgery anywhere other than home, please be sure to leave [place] a number where you can be reached. 2. Go directly home and rest. 3. The keep operated foot (or feet) elevated six inches above the hip when sitting or lying down. 4. Support the elevated foot and leg with pillows under the calf. DO NOT PLACE PILLOWS UNDER THE KNEE. 5. DO NOT REMOVE or get your bandages wet. This will increase your chances of getting an infection. 6. Wear your surgical shoe at all times when you are up. 7. A limited amount of pain and swelling may occur. The skin may take on a bruised appearance. This is no cause for alarm. 8. For slight pain and swelling, apply an ice pack behind the calf or knee for 15 minutes every hour. Continue icing until seen in the office. DO NOT apply any form of heat to the area. 9. Have prescription(s) filled immediately and take as directed. 10. Drink lots of liquids, water, and juice. 11. CALL THE OFFICE IMMEDIATELY IF: a. Bleeding continues (a small amount of dried blood is normal, but should not soak through the dressings) b. Pain increases and/or does not respond to medication c. Bandage or cast appears too tight d. Any liquids (water, coffee, etc.) have spilled on your bandages. e. Tripping, falling, or stubbing the surgical foot f. If your temperature rises above 101 g. If you have ANY questions at all You are expected to be: X weight-bearing ? non-weight bearing 12. Special Instructions: Resume lovenox use tonight and resume taking coumadin as directed by your cardiologist  13. Your next appointment is: 08/05/2020 2:15 PM with Dr March Rummage (Dr Sherryle Lis will be out of the office that day)  If you need to reach the nurse for any reason, please call: [place]: [phone] [place]: [phone] [place]: [phone]    Post Anesthesia Home Care Instructions  Activity: Get plenty of rest for  the remainder of the day. A responsible individual must stay with you for 24 hours following the procedure.  For the next 24 hours, DO NOT: -Drive a car -Paediatric nurse -Drink alcoholic beverages -Take any medication unless instructed by your physician -Make any legal decisions or sign important papers.  Meals: Start with liquid foods such as gelatin or soup. Progress to regular foods as tolerated. Avoid greasy, spicy, heavy foods. If nausea and/or vomiting occur, drink only clear liquids until the nausea and/or vomiting subsides. Call your physician if vomiting continues.  Special Instructions/Symptoms: Your throat may feel dry or sore from the anesthesia or the breathing tube placed in your throat during surgery. If this causes discomfort, gargle with warm salt water. The discomfort should disappear within 24 hours.

## 2020-07-29 NOTE — Anesthesia Postprocedure Evaluation (Signed)
Anesthesia Post Note  Patient: Virginia Franklin  Procedure(s) Performed: HAMMER TOE REPAIR 2ND TOE LEFT (Left Toe) EXCISION OF WOUND OF LEFT SECOND TOE; RELEASE OF TENDONS LEFT THIRD TOE, RIGHT THIRD TOE (Bilateral Toe)     Patient location during evaluation: PACU Anesthesia Type: MAC Level of consciousness: awake and alert Pain management: pain level controlled Vital Signs Assessment: post-procedure vital signs reviewed and stable Respiratory status: spontaneous breathing, nonlabored ventilation, respiratory function stable and patient connected to nasal cannula oxygen Cardiovascular status: stable and blood pressure returned to baseline Postop Assessment: no apparent nausea or vomiting Anesthetic complications: no   No complications documented.  Last Vitals:  Vitals:   07/29/20 1115 07/29/20 1220  BP:  (!) 133/46  Pulse: 67 (!) 58  Resp: 13 16  Temp:  36.6 C  SpO2: 92% 97%    Last Pain:  Vitals:   07/29/20 1220  TempSrc: Axillary  PainSc: 4                  Shaughnessy Gethers S

## 2020-07-29 NOTE — Brief Op Note (Signed)
07/29/2020  10:42 AM  PATIENT:  Virginia Franklin  86 y.o. female  PRE-OPERATIVE DIAGNOSIS:  HAMMERTOE AND OSTEOMYELITIS  POST-OPERATIVE DIAGNOSIS:  HAMMERTOE AND OSTEOMYELITIS  PROCEDURE:  Procedure(s): HAMMER TOE REPAIR 2ND TOE LEFT (Left) EXCISION OF WOUND OF LEFT SECOND TOE; RELEASE OF TENDONS LEFT THIRD TOE, RIGHT THIRD TOE (Bilateral)  SURGEON:  Surgeon(s) and Role:    * Ladawn Boullion, Stephan Minister, DPM - Primary  PHYSICIAN ASSISTANT:   ASSISTANTS: none   ANESTHESIA:   local and IV sedation  EBL:  10cc  BLOOD ADMINISTERED:none  DRAINS: none   LOCAL MEDICATIONS USED:  BUPIVICAINE  and LIDOCAINE total of 17cc  SPECIMEN:  Source of Specimen:  2nd proximal phalanx  DISPOSITION OF SPECIMEN:  Pathology and microbiology  COUNTS:  YES  TOURNIQUET:  * No tourniquets in log *  DICTATION: .Note written in EPIC  PLAN OF CARE: Discharge to home after PACU  PATIENT DISPOSITION:  PACU - hemodynamically stable.   Delay start of Pharmacological VTE agent (>24hrs) due to surgical blood loss or risk of bleeding: no  WBAT in surgical shoes bilaterally

## 2020-07-29 NOTE — Interval H&P Note (Signed)
History and Physical Interval Note:  07/29/2020 9:15 AM  Virginia Franklin  has presented today for surgery, with the diagnosis of HAMMERTOE AND OSTEOMYELITIS.  The various methods of treatment have been discussed with the patient and family. After consideration of risks, benefits and other options for treatment, the patient has consented to  Procedure(s): HAMMER TOEREPAIR 2ND TOE LEFT (Left), flexor tenotomy left 3rd toe and right 3rd toe as a surgical intervention.  The patient's history has been reviewed, patient examined, no change in status, stable for surgery.  I have reviewed the patient's chart and labs.  Questions were answered to the patient's satisfaction.     [name]

## 2020-07-29 NOTE — Transfer of Care (Signed)
Immediate Anesthesia Transfer of Care Note  Patient: Virginia Franklin  Procedure(s) Performed: HAMMER TOE REPAIR 2ND TOE LEFT (Left Toe) EXCISION OF WOUND OF LEFT SECOND TOE; RELEASE OF TENDONS LEFT THIRD TOE, RIGHT THIRD TOE (Bilateral Toe)  Patient Location: PACU  Anesthesia Type:MAC  Level of Consciousness: awake  Airway & Oxygen Therapy: Patient Spontanous Breathing  Post-op Assessment: Report given to RN and Post -op Vital signs reviewed and stable  Post vital signs: Reviewed and stable  Last Vitals:  Vitals Value Taken Time  BP 166/94 07/29/20 1045  Temp 36.5 C 07/29/20 1045  Pulse 68 07/29/20 1047  Resp 15 07/29/20 1047  SpO2 96 % 07/29/20 1047  Vitals shown include unvalidated device data.  Last Pain:  Vitals:   07/29/20 0711  TempSrc: Oral  PainSc: 0-No pain      Patients Stated Pain Goal: 4 (03/88/82 8003)  Complications: No complications documented.

## 2020-08-01 ENCOUNTER — Ambulatory Visit (INDEPENDENT_AMBULATORY_CARE_PROVIDER_SITE_OTHER): Payer: Medicare HMO | Admitting: *Deleted

## 2020-08-01 ENCOUNTER — Encounter (HOSPITAL_BASED_OUTPATIENT_CLINIC_OR_DEPARTMENT_OTHER): Payer: Self-pay | Admitting: Podiatry

## 2020-08-01 ENCOUNTER — Other Ambulatory Visit: Payer: Self-pay

## 2020-08-01 DIAGNOSIS — Z7901 Long term (current) use of anticoagulants: Secondary | ICD-10-CM

## 2020-08-01 DIAGNOSIS — Z952 Presence of prosthetic heart valve: Secondary | ICD-10-CM | POA: Diagnosis not present

## 2020-08-01 DIAGNOSIS — Z5181 Encounter for therapeutic drug level monitoring: Secondary | ICD-10-CM

## 2020-08-01 LAB — SURGICAL PATHOLOGY

## 2020-08-01 LAB — POCT INR: INR: 1.3 — AB (ref 2.0–3.0)

## 2020-08-01 MED ORDER — ENOXAPARIN SODIUM 60 MG/0.6ML ~~LOC~~ SOLN: 60.0000 mg | Freq: Two times a day (BID) | SUBCUTANEOUS | 0 refills | Status: DC

## 2020-08-01 NOTE — Progress Notes (Signed)
Pt's daughter was at appointment and stated that they had  2 syringes left of Lovenox, refill sent.   Called and spoke to [place] updated on dosing instructions.

## 2020-08-01 NOTE — Patient Instructions (Signed)
Description   Continue lovenox injections. Take 1.5 tablets of warfarin today. Then continue to take 1/2 a tablet daily except for 1 tablet on Monday, Wednesdays and Fridays. Recheck INR on Friday 9/24. Call Coumadin clinic with questions [phone]. [name] fills pt's pill box, please call [phone]

## 2020-08-03 ENCOUNTER — Other Ambulatory Visit: Payer: Self-pay | Admitting: Family Medicine

## 2020-08-03 LAB — AEROBIC/ANAEROBIC CULTURE W GRAM STAIN (SURGICAL/DEEP WOUND)
Culture: NO GROWTH
Special Requests: 600

## 2020-08-03 MED ORDER — LISINOPRIL 10 MG PO TABS
10.0000 mg | ORAL_TABLET | Freq: Every day | ORAL | 0 refills | Status: DC
Start: 2020-08-03 — End: 2020-09-26

## 2020-08-03 MED ORDER — MEMANTINE HCL 10 MG PO TABS
10.0000 mg | ORAL_TABLET | Freq: Two times a day (BID) | ORAL | 0 refills | Status: DC
Start: 2020-08-03 — End: 2020-08-26

## 2020-08-03 MED ORDER — EZETIMIBE-SIMVASTATIN 10-40 MG PO TABS: 1.0000 | ORAL_TABLET | Freq: Every day | ORAL | 0 refills | Status: DC

## 2020-08-03 NOTE — Addendum Note (Signed)
Addended by: Rodrigo Ran on: 08/03/2020 04:58 PM   Modules accepted: Orders

## 2020-08-03 NOTE — Telephone Encounter (Signed)
Pt also needs   lisinopril (ZESTRIL) 10 MG tablet    ezetimibe-simvastatin (VYTORIN) 10-40 MG tablet

## 2020-08-03 NOTE — Telephone Encounter (Signed)
Rx sent 

## 2020-08-03 NOTE — Telephone Encounter (Signed)
Virginia Franklin called to say the pt will be out of her medication   memantine (NAMENDA) 10 MG tablet   Tomorrow and the mail order will not come until 2 weeks from now. Is it possible for an emergency order to be sent to the pharmacy  Pt can have some bad side effects if she miss doses  [id number] [address], [place]  [name], [place] [address]  Phone:  [phone] Fax:  [phone]   Please call Timmothy Euler with any questions [phone]

## 2020-08-05 ENCOUNTER — Ambulatory Visit (INDEPENDENT_AMBULATORY_CARE_PROVIDER_SITE_OTHER): Payer: Medicare HMO

## 2020-08-05 ENCOUNTER — Ambulatory Visit (INDEPENDENT_AMBULATORY_CARE_PROVIDER_SITE_OTHER): Payer: Medicare HMO | Admitting: Pharmacist

## 2020-08-05 ENCOUNTER — Other Ambulatory Visit: Payer: Self-pay

## 2020-08-05 ENCOUNTER — Ambulatory Visit (INDEPENDENT_AMBULATORY_CARE_PROVIDER_SITE_OTHER): Payer: Medicare HMO | Admitting: Podiatry

## 2020-08-05 DIAGNOSIS — Z5181 Encounter for therapeutic drug level monitoring: Secondary | ICD-10-CM

## 2020-08-05 DIAGNOSIS — Z7901 Long term (current) use of anticoagulants: Secondary | ICD-10-CM | POA: Diagnosis not present

## 2020-08-05 DIAGNOSIS — M2042 Other hammer toe(s) (acquired), left foot: Secondary | ICD-10-CM

## 2020-08-05 DIAGNOSIS — Z952 Presence of prosthetic heart valve: Secondary | ICD-10-CM | POA: Diagnosis not present

## 2020-08-05 LAB — POCT INR: INR: 2.3 (ref 2.0–3.0)

## 2020-08-05 MED ORDER — ENOXAPARIN SODIUM 60 MG/0.6ML ~~LOC~~ SOLN: 60.0000 mg | Freq: Two times a day (BID) | SUBCUTANEOUS | 0 refills | Status: DC

## 2020-08-05 NOTE — Patient Instructions (Addendum)
Description   Continue lovenox injections 60mg  BID. Increase to 1 tablet tomorrow  and then continue to take 1/2 a tablet daily except for 1 tablet on Monday, Wednesdays and Fridays. Recheck INR on 9/27. Call Coumadin clinic with questions [phone]. [name] fills pt's pill box, please call [phone]

## 2020-08-08 ENCOUNTER — Ambulatory Visit (INDEPENDENT_AMBULATORY_CARE_PROVIDER_SITE_OTHER): Payer: Medicare HMO | Admitting: *Deleted

## 2020-08-08 ENCOUNTER — Other Ambulatory Visit: Payer: Self-pay

## 2020-08-08 DIAGNOSIS — Z5181 Encounter for therapeutic drug level monitoring: Secondary | ICD-10-CM

## 2020-08-08 DIAGNOSIS — Z952 Presence of prosthetic heart valve: Secondary | ICD-10-CM | POA: Diagnosis not present

## 2020-08-08 DIAGNOSIS — Z7901 Long term (current) use of anticoagulants: Secondary | ICD-10-CM | POA: Diagnosis not present

## 2020-08-08 LAB — POCT INR: INR: 2.6 (ref 2.0–3.0)

## 2020-08-08 NOTE — Patient Instructions (Addendum)
Description   Stop Lovenox injections and then continue to take warfarin 1/2 a tablet daily except for 1 tablet on Monday, Wednesdays and Fridays. Recheck INR in 1 week. Call Coumadin clinic with questions [phone]. [name] fills pt's pill box, please call [phone]

## 2020-08-10 NOTE — Progress Notes (Signed)
  Subjective:  Patient ID: Virginia Franklin, female    DOB: 1929/10/07,  MRN: [id number]  Chief Complaint  Patient presents with  . Routine Post Op    Hammer toe repair follow-up    DOS: 07/29/20 Procedure: Left second toe hammertoe repair, flexor tenotomy third toe bilateral (Dr. Sherryle Lis)  86 y.o. female presents with the above complaint. History confirmed with patient.  Doing well not having nausea vomiting fevers or chills.  Only reports small pain.  Objective:  Physical Exam: tenderness at the surgical site, local edema noted, calf supple, nontender and reduced HAV deformity. Incision: healing well, no significant drainage, no dehiscence, no significant erythema  No images are attached to the encounter.  Radiographs: X-ray of the left foot: Consistent with postop state, correction of left second hammertoe deformity  Assessment:   1. Hammer toe of left foot     Plan:  Patient was evaluated and treated and all questions answered.  Post-operative State -XR reviewed with patient -Dressing applied consisting of sterile gauze, kerlix and ACE bandage -WBAT in Surgical shoe  Return in about 1 week (around 08/12/2020).

## 2020-08-15 ENCOUNTER — Telehealth (INDEPENDENT_AMBULATORY_CARE_PROVIDER_SITE_OTHER): Payer: Medicare HMO | Admitting: Family Medicine

## 2020-08-15 ENCOUNTER — Encounter: Payer: Self-pay | Admitting: Family Medicine

## 2020-08-15 DIAGNOSIS — Z8673 Personal history of transient ischemic attack (TIA), and cerebral infarction without residual deficits: Secondary | ICD-10-CM | POA: Diagnosis not present

## 2020-08-15 DIAGNOSIS — I10 Essential (primary) hypertension: Secondary | ICD-10-CM

## 2020-08-15 DIAGNOSIS — E785 Hyperlipidemia, unspecified: Secondary | ICD-10-CM | POA: Diagnosis not present

## 2020-08-15 DIAGNOSIS — G300 Alzheimer's disease with early onset: Secondary | ICD-10-CM

## 2020-08-15 DIAGNOSIS — F0280 Dementia in other diseases classified elsewhere without behavioral disturbance: Secondary | ICD-10-CM

## 2020-08-15 NOTE — Progress Notes (Signed)
Subjective:    Patient ID: Virginia Franklin, female    DOB: Jul 27, 1929, 86 y.o.   MRN: [id number]  HPI Virtual Visit via Video Note  I connected with the patient on 08/15/20 at 11:00 AM EDT by a video enabled telemedicine application and verified that I am speaking with the correct person using two identifiers.  Location patient: home Location provider:work or home office Persons participating in the virtual visit: patient, provider  I discussed the limitations of evaluation and management by telemedicine and the availability of in person appointments. The patient expressed understanding and agreed to proceed.   HPI: Here with her daughter to follow up. She has been feeling well, but per her duaghter the dementia continues to progress. She is more confused, she gets days and nights mixed up, and she often forgets to eat.   ROS: See pertinent positives and negatives per HPI.  Past Medical History:  Diagnosis Date  . Alzheimer's dementia ([place]) 2017  . Arthritis    OA  . Body mass index (BMI) of 22.0-22.9 in adult   . Cervical cancer ([place])    had hysterctomy for  . H/O osteoporosis   . H/O: CVA (cerebrovascular accident) 2012   , HEMORRHAGIC 09-04-2019 ALSO  . H/O: osteoarthritis   . Hammertoe of left foot   . History of urinary frequency   . Hx of diverticulitis of colon   . Hypertension   . Lumbar stress fracture lL3, 01-2020    Past Surgical History:  Procedure Laterality Date  . ABDOMINAL HYSTERECTOMY    . CATARACT EXTRACTION Bilateral   . HAMMER TOE SURGERY Left 07/29/2020   Procedure: HAMMER TOE REPAIR 2ND TOE LEFT;  Surgeon: [name], DPM;  Location: [place];  Service: Podiatry;  Laterality: Left;  . INCISION AND DRAINAGE Bilateral 07/29/2020   Procedure: EXCISION OF WOUND OF LEFT SECOND TOE; RELEASE OF TENDONS LEFT THIRD TOE, RIGHT THIRD TOE;  Surgeon: [name], DPM;  Location: [place];  Service: Podiatry;   Laterality: Bilateral;  . mechanical heart valve  1990   st jude done in [place]  . SMALL INTESTINE SURGERY      Family History  Problem Relation Age of Onset  . Kidney disease Mother        MALIGNANT NEOPLASM  . Arthritis Father   . Heart Problems Father   . Lung disease Brother   . Heart disease Neg Hx      Current Outpatient Medications:  .  clindamycin (CLEOCIN) 300 MG capsule, Take 1 capsule (300 mg total) by mouth 3 (three) times daily. (Patient not taking: Reported on 08/15/2020), Disp: 30 capsule, Rfl: 0 .  enoxaparin (LOVENOX) 60 MG/0.6ML injection, Inject 0.6 mLs (60 mg total) into the skin every 12 (twelve) hours. 6 syringes, Disp: 3.6 mL, Rfl: 0 .  escitalopram (LEXAPRO) 5 MG tablet, TAKE 1 TABLET DAILY, Disp: 90 tablet, Rfl: 3 .  ezetimibe-simvastatin (VYTORIN) 10-40 MG tablet, Take 1 tablet by mouth daily., Disp: 30 tablet, Rfl: 0 .  furosemide (LASIX) 20 MG tablet, TAKE 1 TABLET DAILY, Disp: 90 tablet, Rfl: 3 .  lisinopril (ZESTRIL) 10 MG tablet, Take 1 tablet (10 mg total) by mouth daily., Disp: 30 tablet, Rfl: 0 .  memantine (NAMENDA) 10 MG tablet, Take 1 tablet (10 mg total) by mouth 2 (two) times daily., Disp: 60 tablet, Rfl: 0 .  solifenacin (VESICARE) 5 MG tablet, TAKE 1 TABLET DAILY, Disp: 90 tablet, Rfl: 3 .  warfarin (COUMADIN) 7.5 MG tablet, Take 1 tablet daily except 1/2 tablet on Sunday and Thursday or as directed by Anticoagulation Clinic., Disp: 90 tablet, Rfl: 1  EXAM:  VITALS per patient if applicable:  GENERAL: alert, oriented, appears well and in no acute distress  HEENT: atraumatic, conjunttiva clear, no obvious abnormalities on inspection of external nose and ears  NECK: normal movements of the head and neck  LUNGS: on inspection no signs of respiratory distress, breathing rate appears normal, no obvious gross SOB, gasping or wheezing  CV: no obvious cyanosis  MS: moves all visible extremities without noticeable  abnormality  PSYCH/NEURO: pleasant and cooperative, no obvious depression or anxiety, speech and thought processing grossly intact  ASSESSMENT AND PLAN: Her HTN is stable. Her moods seem to be stable but the Alzheimers is progressing. We will continue the current medications. I asked them to check her BP once or twice a week. She has had her Covid vaccines in April and she has had a flu shot. I reminded them she will be eligible for a Covid booster later this monht.  Alysia Penna, MD  Discussed the following assessment and plan:  No diagnosis found.     I discussed the assessment and treatment plan with the patient. The patient was provided an opportunity to ask questions and all were answered. The patient agreed with the plan and demonstrated an understanding of the instructions.   The patient was advised to call back or seek an in-person evaluation if the symptoms worsen or if the condition fails to improve as anticipated.     Review of Systems     Objective:   Physical Exam        Assessment & Plan:

## 2020-08-17 ENCOUNTER — Ambulatory Visit: Payer: Medicare HMO | Admitting: Podiatry

## 2020-08-18 ENCOUNTER — Other Ambulatory Visit: Payer: Self-pay

## 2020-08-18 ENCOUNTER — Ambulatory Visit (INDEPENDENT_AMBULATORY_CARE_PROVIDER_SITE_OTHER): Payer: Medicare HMO | Admitting: *Deleted

## 2020-08-18 ENCOUNTER — Ambulatory Visit (INDEPENDENT_AMBULATORY_CARE_PROVIDER_SITE_OTHER): Payer: Medicare HMO | Admitting: Podiatry

## 2020-08-18 DIAGNOSIS — Z5181 Encounter for therapeutic drug level monitoring: Secondary | ICD-10-CM

## 2020-08-18 DIAGNOSIS — Z7901 Long term (current) use of anticoagulants: Secondary | ICD-10-CM | POA: Diagnosis not present

## 2020-08-18 DIAGNOSIS — B351 Tinea unguium: Secondary | ICD-10-CM

## 2020-08-18 DIAGNOSIS — L97522 Non-pressure chronic ulcer of other part of left foot with fat layer exposed: Secondary | ICD-10-CM

## 2020-08-18 DIAGNOSIS — Z952 Presence of prosthetic heart valve: Secondary | ICD-10-CM

## 2020-08-18 DIAGNOSIS — M2041 Other hammer toe(s) (acquired), right foot: Secondary | ICD-10-CM

## 2020-08-18 DIAGNOSIS — M79675 Pain in left toe(s): Secondary | ICD-10-CM

## 2020-08-18 DIAGNOSIS — M2042 Other hammer toe(s) (acquired), left foot: Secondary | ICD-10-CM

## 2020-08-18 DIAGNOSIS — M79674 Pain in right toe(s): Secondary | ICD-10-CM

## 2020-08-18 LAB — POCT INR: INR: 5.3 — AB (ref 2.0–3.0)

## 2020-08-18 NOTE — Patient Instructions (Addendum)
Description   Since you have already taken today's dose do not take any Warfarin tomorrow, and no Warfarin Saturday, and 1/2 tablet Sunday then continue to take warfarin 1/2 tablet daily except for 1 tablet on Monday, Wednesdays and Fridays. Recheck INR in 1 week. Call Coumadin clinic with questions [phone]. [name] fills pt's pill box, please call [phone]

## 2020-08-18 NOTE — Progress Notes (Signed)
  Subjective:  Patient ID: Virginia Franklin, female    DOB: May 26, 1929,  MRN: [id number]  Chief Complaint  Patient presents with  . Routine Post Op    Pt stated that she has no cocnerns she is still having some pain     DOS: 07/29/20 Procedure: Left second toe hammertoe repair, flexor tenotomy third toe bilateral  86 y.o. female presents with the above complaint. History confirmed with patient.  Here with her daughter. No pain.  Objective:  Physical Exam: tenderness at the surgical site, local edema noted, calf supple, nontender and reduced HAV deformity. Onychomycosis x10 with incurvated nail borders Incision: well healed, sutures intact.      Assessment:   1. Hammer toe of left foot   2. Hammertoe of right foot   3. Skin ulcer of second toe of left foot with fat layer exposed ([place])   4. Onychomycosis   5. Pain due to onychomycosis of toenails of both feet     Plan:  Patient was evaluated and treated and all questions answered.  Post-operative State -Sutures removed today -WBAT in Surgical shoe. Can transition to a regular shoe when tolerated. -Nails debrided in length to a comfortable level. Calluses do not seem to be returning. -Can resume bathing  Return in about 5 weeks (around 09/22/2020).

## 2020-08-19 ENCOUNTER — Encounter: Payer: Self-pay | Admitting: Podiatry

## 2020-08-25 ENCOUNTER — Other Ambulatory Visit: Payer: Self-pay

## 2020-08-25 ENCOUNTER — Ambulatory Visit (INDEPENDENT_AMBULATORY_CARE_PROVIDER_SITE_OTHER): Payer: Medicare HMO | Admitting: Pharmacist

## 2020-08-25 DIAGNOSIS — Z5181 Encounter for therapeutic drug level monitoring: Secondary | ICD-10-CM

## 2020-08-25 DIAGNOSIS — Z7901 Long term (current) use of anticoagulants: Secondary | ICD-10-CM | POA: Diagnosis not present

## 2020-08-25 DIAGNOSIS — Z952 Presence of prosthetic heart valve: Secondary | ICD-10-CM | POA: Diagnosis not present

## 2020-08-25 LAB — POCT INR: INR: 1.5 — AB (ref 2.0–3.0)

## 2020-08-25 NOTE — Patient Instructions (Signed)
Give an extra half a tablet today and take 1.5 tablets tomorrow. Then continue to take warfarin 1/2 tablet daily except for 1 tablet on Monday, Wednesdays and Fridays. Recheck INR in 1 week. Call Coumadin clinic with questions [phone]. [name] fills pt's pill box, please call [phone]

## 2020-08-26 ENCOUNTER — Other Ambulatory Visit: Payer: Self-pay | Admitting: Family Medicine

## 2020-09-01 ENCOUNTER — Other Ambulatory Visit: Payer: Self-pay

## 2020-09-01 ENCOUNTER — Ambulatory Visit (INDEPENDENT_AMBULATORY_CARE_PROVIDER_SITE_OTHER): Payer: Medicare HMO | Admitting: *Deleted

## 2020-09-01 ENCOUNTER — Encounter: Payer: Medicare HMO | Admitting: Podiatry

## 2020-09-01 DIAGNOSIS — Z5181 Encounter for therapeutic drug level monitoring: Secondary | ICD-10-CM

## 2020-09-01 DIAGNOSIS — Z7901 Long term (current) use of anticoagulants: Secondary | ICD-10-CM | POA: Diagnosis not present

## 2020-09-01 DIAGNOSIS — Z952 Presence of prosthetic heart valve: Secondary | ICD-10-CM

## 2020-09-01 LAB — POCT INR: INR: 3.6 — AB (ref 2.0–3.0)

## 2020-09-01 NOTE — Patient Instructions (Addendum)
Description   Hold tomorrow,  then continue to take warfarin 1/2 tablet daily except for 1 tablet on Monday, Wednesdays and Fridays. Recheck INR in 1.5 weeks. Call Coumadin clinic with questions [phone]. [name] fills pt's pill box, please call [phone]

## 2020-09-02 ENCOUNTER — Encounter: Payer: Medicare HMO | Admitting: Podiatry

## 2020-09-06 ENCOUNTER — Encounter: Payer: Medicare HMO | Admitting: Podiatry

## 2020-09-12 ENCOUNTER — Other Ambulatory Visit: Payer: Self-pay

## 2020-09-12 ENCOUNTER — Ambulatory Visit (INDEPENDENT_AMBULATORY_CARE_PROVIDER_SITE_OTHER): Payer: Medicare HMO | Admitting: Pharmacist

## 2020-09-12 DIAGNOSIS — Z7901 Long term (current) use of anticoagulants: Secondary | ICD-10-CM

## 2020-09-12 DIAGNOSIS — Z5181 Encounter for therapeutic drug level monitoring: Secondary | ICD-10-CM

## 2020-09-12 DIAGNOSIS — Z952 Presence of prosthetic heart valve: Secondary | ICD-10-CM | POA: Diagnosis not present

## 2020-09-12 LAB — POCT INR: INR: 2.9 (ref 2.0–3.0)

## 2020-09-12 NOTE — Patient Instructions (Signed)
Description   Continue to take warfarin 1/2 tablet daily except for 1 tablet on Monday, Wednesdays and Fridays. Recheck INR in 3 weeks. Call Coumadin clinic with questions [phone]. [name] fills pt's pill box, please call [phone]

## 2020-09-23 ENCOUNTER — Other Ambulatory Visit: Payer: Self-pay | Admitting: Family Medicine

## 2020-09-23 NOTE — Telephone Encounter (Signed)
Last VV 08/15/20 Last fill 08/26/20  #30/0

## 2020-09-26 ENCOUNTER — Other Ambulatory Visit: Payer: Self-pay | Admitting: Family Medicine

## 2020-09-29 ENCOUNTER — Ambulatory Visit (INDEPENDENT_AMBULATORY_CARE_PROVIDER_SITE_OTHER): Payer: Medicare HMO

## 2020-09-29 ENCOUNTER — Ambulatory Visit (INDEPENDENT_AMBULATORY_CARE_PROVIDER_SITE_OTHER): Payer: Medicare HMO | Admitting: Podiatry

## 2020-09-29 ENCOUNTER — Other Ambulatory Visit: Payer: Self-pay

## 2020-09-29 ENCOUNTER — Encounter: Payer: Self-pay | Admitting: Podiatry

## 2020-09-29 DIAGNOSIS — M2042 Other hammer toe(s) (acquired), left foot: Secondary | ICD-10-CM

## 2020-09-29 DIAGNOSIS — B351 Tinea unguium: Secondary | ICD-10-CM

## 2020-09-29 DIAGNOSIS — M79674 Pain in right toe(s): Secondary | ICD-10-CM | POA: Diagnosis not present

## 2020-09-29 DIAGNOSIS — M79675 Pain in left toe(s): Secondary | ICD-10-CM

## 2020-09-29 DIAGNOSIS — L84 Corns and callosities: Secondary | ICD-10-CM

## 2020-09-29 NOTE — Progress Notes (Signed)
  Subjective:  Patient ID: Virginia Franklin, female    DOB: December 20, 1928,  MRN: [id number]  Chief Complaint  Patient presents with  . Routine Post Op    POV # 3 DOS 07/29/20 HAMMERTOE REPAIR 2ND LT, TENDON RELEASE 3RD TOE B/L    DOS: 07/29/20 Procedure: Left second toe hammertoe repair, flexor tenotomy third toe bilateral  86 y.o. female presents with the above complaint. History confirmed with patient.  Here with her daughter. No pain.  Objective:  Physical Exam: tenderness at the surgical site, local edema noted, calf supple, nontender and reduced HAV deformity. Onychomycosis x10 with incurvated nail borders. No callus R 3rd toe today, slight callus L 3rd toe Incision: well healed, sutures intact.     Radiographs left foot: good surgical correction maintained, some regrowth of bone at arthroplasty site Assessment:   1. Hammer toe of left foot     Plan:  Patient was evaluated and treated and all questions answered.  Discussed the etiology and treatment options for the condition in detail with the patient. Recommended debridement of the nails today. Sharp and mechanical debridement performed of all painful and mycotic nails today. Nails debrided in length and thickness using a nail nipper. Follow up as needed for painful nails.  Continue regular bathing and lotion to incision as well as calluses. Calluses seemed to have healed and after debridement of left 3rd hopeful that they will not recur.    Return on 3 month routine care schedule, sooner if issues  No follow-ups on file.

## 2020-10-03 ENCOUNTER — Ambulatory Visit (INDEPENDENT_AMBULATORY_CARE_PROVIDER_SITE_OTHER): Payer: Medicare HMO | Admitting: *Deleted

## 2020-10-03 ENCOUNTER — Other Ambulatory Visit: Payer: Self-pay

## 2020-10-03 DIAGNOSIS — Z5181 Encounter for therapeutic drug level monitoring: Secondary | ICD-10-CM | POA: Diagnosis not present

## 2020-10-03 DIAGNOSIS — Z7901 Long term (current) use of anticoagulants: Secondary | ICD-10-CM | POA: Diagnosis not present

## 2020-10-03 DIAGNOSIS — Z952 Presence of prosthetic heart valve: Secondary | ICD-10-CM | POA: Diagnosis not present

## 2020-10-03 LAB — POCT INR: INR: 2.8 (ref 2.0–3.0)

## 2020-10-03 NOTE — Patient Instructions (Signed)
Description   Continue taking warfarin 1/2 tablet daily except for 1 tablet on Monday, Wednesdays and Fridays. Recheck INR in 4 weeks. Call Coumadin clinic with questions [phone]. [name] fills pt's pill box, please call [phone]

## 2020-10-07 ENCOUNTER — Other Ambulatory Visit: Payer: Self-pay | Admitting: Family Medicine

## 2020-10-10 ENCOUNTER — Other Ambulatory Visit: Payer: Self-pay | Admitting: Interventional Cardiology

## 2020-10-20 ENCOUNTER — Encounter: Payer: Self-pay | Admitting: Family Medicine

## 2020-10-20 ENCOUNTER — Other Ambulatory Visit: Payer: Self-pay

## 2020-10-20 ENCOUNTER — Ambulatory Visit (INDEPENDENT_AMBULATORY_CARE_PROVIDER_SITE_OTHER): Payer: Medicare HMO | Admitting: Family Medicine

## 2020-10-20 VITALS — BP 128/72 | HR 68 | Temp 98.1°F

## 2020-10-20 DIAGNOSIS — M109 Gout, unspecified: Secondary | ICD-10-CM

## 2020-10-20 MED ORDER — TRAMADOL HCL 50 MG PO TABS: 100.0000 mg | ORAL_TABLET | Freq: Four times a day (QID) | ORAL | 2 refills | Status: DC | PRN

## 2020-10-20 MED ORDER — METHYLPREDNISOLONE ACETATE 40 MG/ML IJ SUSP
40.0000 mg | Freq: Once | INTRAMUSCULAR | Status: AC
  Administered 2020-10-20: 40 mg via INTRAMUSCULAR

## 2020-10-20 MED ORDER — METHYLPREDNISOLONE ACETATE 80 MG/ML IJ SUSP
80.0000 mg | Freq: Once | INTRAMUSCULAR | Status: AC
  Administered 2020-10-20: 80 mg via INTRAMUSCULAR

## 2020-10-20 NOTE — Patient Instructions (Signed)
Out

## 2020-10-20 NOTE — Addendum Note (Signed)
Addended by: Otilio Miu on: 10/20/2020 11:29 AM   Modules accepted: Orders

## 2020-10-20 NOTE — Progress Notes (Signed)
   Subjective:    Patient ID: Virginia Franklin, female    DOB: 1929/07/02, 86 y.o.   MRN: [id number]  HPI Here with her daughter for the sudden onset 3 days ago of swelling and pain in the lft wrist. No recent trauma. The wrist is red and warm, and the skin itches. She also has a milder pain in the left elbow. She has never had this before. When we spoke about any recent dietary changes, her daughter says she recently brought her mother 2 jars of a special German-style pickled fish. The patient says she has eaten one jar full already.    Review of Systems  Constitutional: Negative.   Respiratory: Negative.   Cardiovascular: Negative.   Musculoskeletal: Positive for arthralgias and joint swelling.       Objective:   Physical Exam Constitutional:      General: She is not in acute distress.    Comments: In a wheelchair   Cardiovascular:     Rate and Rhythm: Normal rate and regular rhythm.     Pulses: Normal pulses.     Heart sounds: Normal heart sounds.  Pulmonary:     Effort: Pulmonary effort is normal.     Breath sounds: Normal breath sounds.  Musculoskeletal:     Comments: Left wrist is red, swollen, warm, and very tender. ROM is very reduced due to pain. The left elbow is mildly tender over the lateral epicondyle, but there is no swelling or warmth or redness. ROM is full   Neurological:     Mental Status: She is alert.           Assessment & Plan:  This is gout. We will treat it with a shot of DepoMedrol. She can add Tramadol for pain. The pickled fish may have been the triggering factor, so her daughter will take this away. We will check a uric acid level the next time she is here.  Alysia Penna, MD

## 2020-11-02 ENCOUNTER — Ambulatory Visit (INDEPENDENT_AMBULATORY_CARE_PROVIDER_SITE_OTHER): Payer: Medicare HMO | Admitting: *Deleted

## 2020-11-02 ENCOUNTER — Other Ambulatory Visit: Payer: Self-pay

## 2020-11-02 DIAGNOSIS — Z5181 Encounter for therapeutic drug level monitoring: Secondary | ICD-10-CM | POA: Diagnosis not present

## 2020-11-02 DIAGNOSIS — Z952 Presence of prosthetic heart valve: Secondary | ICD-10-CM | POA: Diagnosis not present

## 2020-11-02 DIAGNOSIS — Z7901 Long term (current) use of anticoagulants: Secondary | ICD-10-CM

## 2020-11-02 LAB — POCT INR: INR: 2.6 (ref 2.0–3.0)

## 2020-11-02 NOTE — Patient Instructions (Signed)
Description   Continue taking warfarin 1/2 tablet daily except for 1 tablet on Monday, Wednesdays and Fridays. Recheck INR in 5 weeks. Call Coumadin clinic with questions [phone]. [name] fills pt's pill box, please call [phone]

## 2020-11-14 ENCOUNTER — Ambulatory Visit: Payer: Medicare HMO | Admitting: Neurology

## 2020-12-07 ENCOUNTER — Telehealth: Payer: Self-pay

## 2020-12-07 ENCOUNTER — Other Ambulatory Visit: Payer: Self-pay

## 2020-12-07 ENCOUNTER — Ambulatory Visit (INDEPENDENT_AMBULATORY_CARE_PROVIDER_SITE_OTHER): Payer: Medicare HMO | Admitting: Pharmacist

## 2020-12-07 VITALS — Wt 123.4 lb

## 2020-12-07 DIAGNOSIS — Z7901 Long term (current) use of anticoagulants: Secondary | ICD-10-CM

## 2020-12-07 DIAGNOSIS — Z5181 Encounter for therapeutic drug level monitoring: Secondary | ICD-10-CM | POA: Diagnosis not present

## 2020-12-07 DIAGNOSIS — Z952 Presence of prosthetic heart valve: Secondary | ICD-10-CM

## 2020-12-07 LAB — POCT INR: INR: 1.5 — AB (ref 2.0–3.0)

## 2020-12-07 NOTE — Progress Notes (Addendum)
1/26: Virginia Franklin at 2pm, left message.  1/26: Virginia Franklin at 4:50pm, still no answer. Also called pt's daughter and left message.  Need to ensure that today and tomorrow's warfarin boost are completed.  1/27: Virginia Franklin at 2pm, still no answer. Called daughter again, she states she spoke with [name] yesterday and he gave pt correct Coumadin boost.

## 2020-12-07 NOTE — Patient Instructions (Signed)
Description   Take 1 1/2 tablet today and tomorrow. Then, continue taking warfarin 1/2 tablet daily except for 1 tablet on Monday, Wednesdays and Fridays. We will see you back in 1 week. Call Coumadin clinic with questions [phone]. [name] fills pt's pill box, please call [phone]

## 2020-12-07 NOTE — Telephone Encounter (Signed)
LMOM FOR OVERDUE INR 

## 2020-12-14 ENCOUNTER — Ambulatory Visit (INDEPENDENT_AMBULATORY_CARE_PROVIDER_SITE_OTHER): Payer: Medicare HMO

## 2020-12-14 ENCOUNTER — Other Ambulatory Visit: Payer: Self-pay

## 2020-12-14 DIAGNOSIS — Z7901 Long term (current) use of anticoagulants: Secondary | ICD-10-CM

## 2020-12-14 DIAGNOSIS — Z5181 Encounter for therapeutic drug level monitoring: Secondary | ICD-10-CM

## 2020-12-14 DIAGNOSIS — Z952 Presence of prosthetic heart valve: Secondary | ICD-10-CM

## 2020-12-14 LAB — POCT INR: INR: 3.3 — AB (ref 2.0–3.0)

## 2020-12-14 NOTE — Patient Instructions (Addendum)
Description   Skip tomorrow's dosage of Warfarin, then resume same dosage of warfarin 1/2 tablet daily except for 1 tablet on Mondays, Wednesdays and Fridays. Recheck in 2 weeks. Call Coumadin clinic with questions #[phone]. Jared fills pt's pill box, please call [phone].      

## 2020-12-19 ENCOUNTER — Encounter: Payer: Self-pay | Admitting: Family Medicine

## 2020-12-19 MED ORDER — FUROSEMIDE 20 MG PO TABS: 20.0000 mg | ORAL_TABLET | Freq: Every day | ORAL | 3 refills | Status: DC

## 2020-12-19 MED ORDER — LISINOPRIL 10 MG PO TABS: ORAL_TABLET | ORAL | 3 refills | Status: DC

## 2020-12-19 MED ORDER — SOLIFENACIN SUCCINATE 5 MG PO TABS: 5.0000 mg | ORAL_TABLET | Freq: Every day | ORAL | 3 refills | Status: DC

## 2020-12-19 MED ORDER — MEMANTINE HCL 10 MG PO TABS: 10.0000 mg | ORAL_TABLET | Freq: Two times a day (BID) | ORAL | 3 refills | Status: DC

## 2020-12-19 MED ORDER — ESCITALOPRAM OXALATE 5 MG PO TABS: 5.0000 mg | ORAL_TABLET | Freq: Every day | ORAL | 3 refills | Status: DC

## 2020-12-19 MED ORDER — EZETIMIBE-SIMVASTATIN 10-40 MG PO TABS: 1.0000 | ORAL_TABLET | Freq: Every day | ORAL | 3 refills | Status: DC

## 2020-12-19 MED ORDER — TRAMADOL HCL 50 MG PO TABS: 100.0000 mg | ORAL_TABLET | Freq: Four times a day (QID) | ORAL | 5 refills | Status: DC | PRN

## 2020-12-19 NOTE — Telephone Encounter (Signed)
[name], daughter of the patient was calling to see what she needs to do with the patient's medications. [facility] won't talk with her for refills and she was wanting to know if she should have all of her medication sent to [facility] to have [facility].   Please advise

## 2020-12-19 NOTE — Telephone Encounter (Signed)
Everything was sent to [facility]

## 2020-12-19 NOTE — Telephone Encounter (Signed)
Patient's daughter, Eustaquio Boyden, notified VIA phone that prescriptions were sent to the [facility]. Dm/cma

## 2020-12-28 ENCOUNTER — Other Ambulatory Visit: Payer: Self-pay | Admitting: Interventional Cardiology

## 2020-12-29 ENCOUNTER — Ambulatory Visit (INDEPENDENT_AMBULATORY_CARE_PROVIDER_SITE_OTHER): Payer: Medicare HMO | Admitting: *Deleted

## 2020-12-29 ENCOUNTER — Other Ambulatory Visit: Payer: Self-pay

## 2020-12-29 DIAGNOSIS — Z7901 Long term (current) use of anticoagulants: Secondary | ICD-10-CM

## 2020-12-29 DIAGNOSIS — Z5181 Encounter for therapeutic drug level monitoring: Secondary | ICD-10-CM | POA: Diagnosis not present

## 2020-12-29 DIAGNOSIS — Z952 Presence of prosthetic heart valve: Secondary | ICD-10-CM | POA: Diagnosis not present

## 2020-12-29 LAB — POCT INR: INR: 2.4 (ref 2.0–3.0)

## 2020-12-29 NOTE — Patient Instructions (Addendum)
Description   Take 1 tablet today and then continue same dosage of warfarin 1/2 tablet daily except for 1 tablet on Mondays, Wednesdays and Fridays. Recheck in 3 weeks. Call Coumadin clinic with questions [phone]. Jared fills pt's pill box, please call [phone].

## 2021-01-17 NOTE — Progress Notes (Signed)
Subjective:   Virginia Franklin is a 87 y.o. female who presents for an Initial Medicare Annual Wellness Visit.  Virtual Visit via Video Note  I connected with Virginia Franklin by a video enabled telemedicine application and verified that I am speaking with the correct person using two identifiers.  Location: Patient: Home Provider: Office Persons participating in the virtual visit: patient, provider   I discussed the limitations of evaluation and management by telemedicine and the availability of in person appointments. The patient expressed understanding and agreed to proceed.     Larene Beach Mistina Coatney,LPN    Review of Systems    N/A  Cardiac Risk Factors include: advanced age (>19men, >37 women);dyslipidemia;hypertension     Objective:    Today's Vitals   01/18/21 1317  PainSc: 10-Worst pain ever   There is no height or weight on file to calculate BMI.  Advanced Directives 01/18/2021 04/06/2020 01/17/2020 01/17/2020 09/04/2019 03/29/2019 12/16/2017  Does Patient Have a Medical Advance Directive? Yes Yes No No No No No  Type of Advance Directive Out of facility DNR (pink MOST or yellow form);Healthcare Power of Attorney Living will;Out of facility DNR (pink MOST or yellow form);[place]  Does patient want to make changes to medical advance directive? No - Patient declined - - - - - -  Copy of [place] in Chart? No - copy requested - - - - - -  Would patient like information on creating a medical advance directive? - - No - Patient declined No - Patient declined - No - Patient declined No - Patient declined    Current Medications (verified) Outpatient Encounter Medications as of 01/18/2021  Medication Sig  . escitalopram (LEXAPRO) 5 MG tablet Take 1 tablet (5 mg total) by mouth daily.  Marland Kitchen ezetimibe-simvastatin (VYTORIN) 10-40 MG tablet Take 1 tablet by mouth daily.  . furosemide (LASIX) 20 MG tablet Take 1 tablet (20 mg total) by  mouth daily.  Marland Kitchen lisinopril (ZESTRIL) 10 MG tablet TAKE 1 TABLET DAILY (NEED AN APPOINTMENT FOR FURTHER REFILLS)  . memantine (NAMENDA) 10 MG tablet Take 1 tablet (10 mg total) by mouth 2 (two) times daily.  . solifenacin (VESICARE) 5 MG tablet Take 1 tablet (5 mg total) by mouth daily.  . traMADol (ULTRAM) 50 MG tablet Take 2 tablets (100 mg total) by mouth every 6 (six) hours as needed.  . warfarin (COUMADIN) 7.5 MG tablet TAKE HALF (1/2) A TABLET TO ONE TABLET DAILY BY MOUTH AS DIRECTED BY THE COUMADIN CLINIC   No facility-administered encounter medications on file as of 01/18/2021.    Allergies (verified) Penicillins   History: Past Medical History:  Diagnosis Date  . Alzheimer's dementia ([place]) 2017  . Arthritis    OA  . Body mass index (BMI) of 22.0-22.9 in adult   . Cervical cancer ([place])    had hysterctomy for  . H/O osteoporosis   . H/O: CVA (cerebrovascular accident) 2012   , HEMORRHAGIC 09-04-2019 ALSO  . H/O: osteoarthritis   . Hammertoe of left foot   . History of urinary frequency   . Hx of diverticulitis of colon   . Hypertension   . Lumbar stress fracture lL3, 01-2020   Past Surgical History:  Procedure Laterality Date  . ABDOMINAL HYSTERECTOMY    . CATARACT EXTRACTION Bilateral   . HAMMER TOE SURGERY Left 07/29/2020   Procedure: HAMMER TOE REPAIR 2ND TOE LEFT;  Surgeon: [name], DPM;  Location: [facility];  Service: Podiatry;  Laterality: Left;  . INCISION AND DRAINAGE Bilateral 07/29/2020   Procedure: EXCISION OF WOUND OF LEFT SECOND TOE; RELEASE OF TENDONS LEFT THIRD TOE, RIGHT THIRD TOE;  Surgeon: [name], DPM;  Location: [facility];  Service: Podiatry;  Laterality: Bilateral;  . mechanical heart valve  1990   [facility] done in [place]  . SMALL INTESTINE SURGERY     Family History  Problem Relation Age of Onset  . Kidney disease Mother        MALIGNANT NEOPLASM  . Arthritis Father   . Heart Problems Father    . Lung disease Brother   . Heart disease Neg Hx    Social History   Socioeconomic History  . Marital status: Widowed    Spouse name: Not on file  . Number of children: 2  . Years of education: 32  . Highest education level: Not on file  Occupational History  . Not on file  Tobacco Use  . Smoking status: Never Smoker  . Smokeless tobacco: Never Used  Vaping Use  . Vaping Use: Never used  Substance and Sexual Activity  . Alcohol use: Yes    Comment: SOCIALLY  . Drug use: No  . Sexual activity: Not on file    Comment: WIDOW  Other Topics Concern  . Not on file  Social History Narrative   Lives alone in a retirement home.  Has 2 children.  Retired Haematologist.  Education: Probation officer school.    Social Determinants of Health   Financial Resource Strain: Low Risk   . Difficulty of Paying Living Expenses: Not hard at all  Food Insecurity: No Food Insecurity  . Worried About Charity fundraiser in the Last Year: Never true  . Ran Out of Food in the Last Year: Never true  Transportation Needs: No Transportation Needs  . Lack of Transportation (Medical): No  . Lack of Transportation (Non-Medical): No  Physical Activity: Inactive  . Days of Exercise per Week: 0 days  . Minutes of Exercise per Session: 0 min  Stress: No Stress Concern Present  . Feeling of Stress : Not at all  Social Connections: Socially Isolated  . Frequency of Communication with Friends and Family: Three times a week  . Frequency of Social Gatherings with Friends and Family: More than three times a week  . Attends Religious Services: Never  . Active Member of Clubs or Organizations: No  . Attends Archivist Meetings: Never  . Marital Status: Widowed    Tobacco Counseling Counseling given: Not Answered   Clinical Intake:     Pain : 0-10 Pain Score: 10-Worst pain ever Pain Type: Chronic pain Pain Location: Shoulder (knee) Pain Orientation: Right,Left Pain Descriptors / Indicators:  Aching Pain Onset: More than a month ago Pain Frequency: Constant Pain Relieving Factors: Tramadol  Pain Relieving Factors: Tramadol  Nutritional Risks: Unintentional weight loss (has not been eating breakfast and lunch) Diabetes: No  How often do you need to have someone help you when you read instructions, pamphlets, or other written materials from your doctor or pharmacy?: 1 - Never What is the last grade level you completed in school?: College  Diabetic?No   Interpreter Needed?: No  Information entered by :: [place] of Daily Living In your present state of health, do you have any difficulty performing the following activities: 01/18/2021 07/29/2020  Hearing? Tempie Donning  Vision? N N  Difficulty concentrating or making decisions? Y N  Walking or climbing stairs? Y Y  Dressing or bathing? N N  Doing errands, shopping? Y -  Conservation officer, nature and eating ? N -  Using the Toilet? N -  In the past six months, have you accidently leaked urine? Y -  Do you have problems with loss of bowel control? N -  Managing your Medications? Y -  Managing your Finances? Y -  Housekeeping or managing your Housekeeping? N -  Some recent data might be hidden    Patient Care Team: [name], MD as PCP - General (Family Medicine) Belva Crome, MD as PCP - Cardiology (Cardiology) Cameron Sprang, MD as Consulting Physician (Neurology)  Indicate any recent Medical Services you may have received from other than [facility] providers in the past year (date may be approximate).     Assessment:   This is a routine wellness examination for Virginia Franklin.  Hearing/Vision screen  Hearing Screening   125Hz  250Hz  500Hz  1000Hz  2000Hz  3000Hz  4000Hz  6000Hz  8000Hz   Right ear:           Left ear:           Vision Screening Comments: Has not had eyes exams in awhile.   Dietary issues and exercise activities discussed: Current Exercise Habits: The patient does not participate in regular exercise at  present  Goals    . Patient Stated     I would like to stay active and continuing living        Depression Screen PHQ 2/9 Scores 01/18/2021 04/06/2020 10/13/2019 01/13/2018 10/15/2017  PHQ - 2 Score 0 0 0 0 0  PHQ- 9 Score - - 5 - -    Fall Risk Fall Risk  01/18/2021 04/06/2020 08/12/2018 01/28/2018 01/13/2018  Falls in the past year? - 1 No Yes Yes  Number falls in past yr: 0 0 - 2 or more 2 or more  Injury with Fall? 0 0 - No Yes  Risk Factor Category  - - - High Fall Risk -  Risk for fall due to : History of fall(s) - - Impaired balance/gait -  Follow up Falls evaluation completed;Falls prevention discussed - - Falls evaluation completed;Education provided;Falls prevention discussed -    FALL RISK PREVENTION PERTAINING TO THE HOME:  Any stairs in or around the home? No  If so, are there any without handrails? No  Home free of loose throw rugs in walkways, pet beds, electrical cords, etc? Yes  Adequate lighting in your home to reduce risk of falls? Yes   ASSISTIVE DEVICES UTILIZED TO PREVENT FALLS:  Life alert? No  Use of a cane, walker or w/c? No  Grab bars in the bathroom? Yes  Shower chair or bench in shower? Yes  Elevated toilet seat or a handicapped toilet? Yes     Cognitive Function: Cognitive status assessed by direct observation. Patient has current diagnosis of cognitive impairment. Patient is unable to complete screening 6CIT or MMSE.    MMSE - Mini Mental State Exam 04/06/2020  Orientation to time 1  Orientation to Place 1  Registration 3  Attention/ Calculation 5  Recall 0  Language- name 2 objects 2  Language- repeat 1  Language- follow 3 step command 3  Language- read & follow direction 1  Write a sentence 1  Copy design 1  Total score 19   Montreal Cognitive Assessment  01/28/2018  Visuospatial/ Executive (0/5) 5  Naming (0/3) 3  Attention: Read  list of digits (0/2) 1  Attention: Read list of letters (0/1) 1  Attention: Serial 7 subtraction starting at  100 (0/3) 3  Language: Repeat phrase (0/2) 0  Language : Fluency (0/1) 0  Abstraction (0/2) 1  Delayed Recall (0/5) 2  Orientation (0/6) 4  Total 20      Immunizations Immunization History  Administered Date(s) Administered  . Fluad Quad(high Dose 65+) 07/31/2019  . Influenza, High Dose Seasonal PF 07/25/2018  . Influenza-Unspecified 09/06/2017, 07/27/2020  . Moderna Sars-Covid-2 Vaccination 11/30/2019, 12/28/2019  . Tdap 04/28/2018    TDAP status: Up to date  Flu Vaccine status: Up to date  Pneumococcal vaccine status: Due, Education has been provided regarding the importance of this vaccine. Advised may receive this vaccine at local pharmacy or Health Dept. Aware to provide a copy of the vaccination record if obtained from local pharmacy or Health Dept. Verbalized acceptance and understanding.  Covid-19 vaccine status: Completed vaccines  Qualifies for Shingles Vaccine? Yes   Zostavax completed No   Shingrix Completed?: No.    Education has been provided regarding the importance of this vaccine. Patient has been advised to call insurance company to determine out of pocket expense if they have not yet received this vaccine. Advised may also receive vaccine at local pharmacy or Health Dept. Verbalized acceptance and understanding.  Screening Tests Health Maintenance  Topic Date Due  . PNA vac Low Risk Adult (1 of 2 - PCV13) Never done  . COVID-19 Vaccine (3 - Booster for Moderna series) 06/26/2020  . TETANUS/TDAP  04/28/2028  . INFLUENZA VACCINE  Completed  . HPV VACCINES  Aged Out  . DEXA SCAN  Discontinued    Health Maintenance  Health Maintenance Due  Topic Date Due  . PNA vac Low Risk Adult (1 of 2 - PCV13) Never done  . COVID-19 Vaccine (3 - Booster for Moderna series) 06/26/2020    Colorectal cancer screening: No longer required.   Mammogram status: No longer required due to age.  Bone Density Status: No longer required   Lung Cancer Screening: (Low Dose  CT Chest recommended if Age 31-80 years, 30 pack-year currently smoking OR have quit w/in 15years.) does not qualify.   Lung Cancer Screening Referral: N/A  Additional Screening:  Hepatitis C Screening: does not qualify;   Vision Screening: Recommended annual ophthalmology exams for early detection of glaucoma and other disorders of the eye. Is the patient up to date with their annual eye exam?  No  Who is the provider or what is the name of the office in which the patient attends annual eye exams? N/A  If pt is not established with a provider, would they like to be referred to a provider to establish care? No .   Dental Screening: Recommended annual dental exams for proper oral hygiene  Community Resource Referral / Chronic Care Management: CRR required this visit?  No   CCM required this visit?  No      Plan:     I have personally reviewed and noted the following in the patient's chart:   . Medical and social history . Use of alcohol, tobacco or illicit drugs  . Current medications and supplements . Functional ability and status . Nutritional status . Physical activity . Advanced directives . List of other physicians . Hospitalizations, surgeries, and ER visits in previous 12 months . Vitals . Screenings to include cognitive, depression, and falls . Referrals and appointments  In addition, I have reviewed and discussed  with patient certain preventive protocols, quality metrics, and best practice recommendations. A written personalized care plan for preventive services as well as general preventive health recommendations were provided to patient.     Ofilia Neas, LPN   04/18/8937   Nurse Notes: None

## 2021-01-18 ENCOUNTER — Other Ambulatory Visit: Payer: Self-pay

## 2021-01-18 ENCOUNTER — Ambulatory Visit (INDEPENDENT_AMBULATORY_CARE_PROVIDER_SITE_OTHER): Payer: Medicare HMO

## 2021-01-18 ENCOUNTER — Ambulatory Visit (INDEPENDENT_AMBULATORY_CARE_PROVIDER_SITE_OTHER): Payer: Medicare HMO | Admitting: *Deleted

## 2021-01-18 DIAGNOSIS — Z952 Presence of prosthetic heart valve: Secondary | ICD-10-CM | POA: Diagnosis not present

## 2021-01-18 DIAGNOSIS — Z0000 Encounter for general adult medical examination without abnormal findings: Secondary | ICD-10-CM | POA: Diagnosis not present

## 2021-01-18 DIAGNOSIS — Z5181 Encounter for therapeutic drug level monitoring: Secondary | ICD-10-CM

## 2021-01-18 DIAGNOSIS — Z7901 Long term (current) use of anticoagulants: Secondary | ICD-10-CM

## 2021-01-18 LAB — POCT INR: INR: 2.5 (ref 2.0–3.0)

## 2021-01-18 NOTE — Patient Instructions (Signed)
Description   Continue same dosage of warfarin 1/2 tablet daily except for 1 tablet on Mondays, Wednesdays and Fridays. Recheck in 4 weeks. Call Coumadin clinic with questions #[phone]. Jared fills pt's pill box, please call [phone].     

## 2021-01-18 NOTE — Patient Instructions (Signed)
Virginia Franklin , Thank you for taking time to come for your Medicare Wellness Visit. I appreciate your ongoing commitment to your health goals. Please review the following plan we discussed and let me know if I can assist you in the future.   Screening recommendations/referrals: Colonoscopy: no longer required  Mammogram: no longer required  Bone Density: No longer required  Recommended yearly ophthalmology/optometry visit for glaucoma screening and checkup Recommended yearly dental visit for hygiene and checkup  Vaccinations: Influenza vaccine: Up to date, next due fall 2022  Pneumococcal vaccine: Currently due for Pneumonia vaccines, you may receive at your next in person office visit or if you have previous records where you have received them we can update your chart.  Tdap vaccine: Up to date, next due 04/28/2028 Shingles vaccine: Currently due for Shingrix, if you would like to receive we recommend that you do so at your local pharmacy as it is less expensive to do so.     Advanced directives: Please bring copies of your advanced medical directives so that we may scan them into your chart.  Conditions/risks identified: None   Next appointment: None    Preventive Care 65 Years and Older, Female Preventive care refers to lifestyle choices and visits with your health care provider that can promote health and wellness. What does preventive care include?  A yearly physical exam. This is also called an annual well check.  Dental exams once or twice a year.  Routine eye exams. Ask your health care provider how often you should have your eyes checked.  Personal lifestyle choices, including:  Daily care of your teeth and gums.  Regular physical activity.  Eating a healthy diet.  Avoiding tobacco and drug use.  Limiting alcohol use.  Practicing safe sex.  Taking low-dose aspirin every day.  Taking vitamin and mineral supplements as recommended by your health care  provider. What happens during an annual well check? The services and screenings done by your health care provider during your annual well check will depend on your age, overall health, lifestyle risk factors, and family history of disease. Counseling  Your health care provider may ask you questions about your:  Alcohol use.  Tobacco use.  Drug use.  Emotional well-being.  Home and relationship well-being.  Sexual activity.  Eating habits.  History of falls.  Memory and ability to understand (cognition).  Work and work Statistician.  Reproductive health. Screening  You may have the following tests or measurements:  Height, weight, and BMI.  Blood pressure.  Lipid and cholesterol levels. These may be checked every 5 years, or more frequently if you are over 79 years old.  Skin check.  Lung cancer screening. You may have this screening every year starting at age 64 if you have a 30-pack-year history of smoking and currently smoke or have quit within the past 15 years.  Fecal occult blood test (FOBT) of the stool. You may have this test every year starting at age 40.  Flexible sigmoidoscopy or colonoscopy. You may have a sigmoidoscopy every 5 years or a colonoscopy every 10 years starting at age 12.  Hepatitis C blood test.  Hepatitis B blood test.  Sexually transmitted disease (STD) testing.  Diabetes screening. This is done by checking your blood sugar (glucose) after you have not eaten for a while (fasting). You may have this done every 1-3 years.  Bone density scan. This is done to screen for osteoporosis. You may have this done starting at age 61.  Mammogram. This may be done every 1-2 years. Talk to your health care provider about how often you should have regular mammograms. Talk with your health care provider about your test results, treatment options, and if necessary, the need for more tests. Vaccines  Your health care provider may recommend certain  vaccines, such as:  Influenza vaccine. This is recommended every year.  Tetanus, diphtheria, and acellular pertussis (Tdap, Td) vaccine. You may need a Td booster every 10 years.  Zoster vaccine. You may need this after age 59.  Pneumococcal 13-valent conjugate (PCV13) vaccine. One dose is recommended after age 22.  Pneumococcal polysaccharide (PPSV23) vaccine. One dose is recommended after age 69. Talk to your health care provider about which screenings and vaccines you need and how often you need them. This information is not intended to replace advice given to you by your health care provider. Make sure you discuss any questions you have with your health care provider. Document Released: 11/25/2015 Document Revised: 07/18/2016 Document Reviewed: 08/30/2015 Elsevier Interactive Patient Education  2017 [place] Prevention in the Home Falls can cause injuries. They can happen to people of all ages. There are many things you can do to make your home safe and to help prevent falls. What can I do on the outside of my home?  Regularly fix the edges of walkways and driveways and fix any cracks.  Remove anything that might make you trip as you walk through a door, such as a raised step or threshold.  Trim any bushes or trees on the path to your home.  Use bright outdoor lighting.  Clear any walking paths of anything that might make someone trip, such as rocks or tools.  Regularly check to see if handrails are loose or broken. Make sure that both sides of any steps have handrails.  Any raised decks and porches should have guardrails on the edges.  Have any leaves, snow, or ice cleared regularly.  Use sand or salt on walking paths during winter.  Clean up any spills in your garage right away. This includes oil or grease spills. What can I do in the bathroom?  Use night lights.  Install grab bars by the toilet and in the tub and shower. Do not use towel bars as grab  bars.  Use non-skid mats or decals in the tub or shower.  If you need to sit down in the shower, use a plastic, non-slip stool.  Keep the floor dry. Clean up any water that spills on the floor as soon as it happens.  Remove soap buildup in the tub or shower regularly.  Attach bath mats securely with double-sided non-slip rug tape.  Do not have throw rugs and other things on the floor that can make you trip. What can I do in the bedroom?  Use night lights.  Make sure that you have a light by your bed that is easy to reach.  Do not use any sheets or blankets that are too big for your bed. They should not hang down onto the floor.  Have a firm chair that has side arms. You can use this for support while you get dressed.  Do not have throw rugs and other things on the floor that can make you trip. What can I do in the kitchen?  Clean up any spills right away.  Avoid walking on wet floors.  Keep items that you use a lot in easy-to-reach places.  If you need to reach  something above you, use a strong step stool that has a grab bar.  Keep electrical cords out of the way.  Do not use floor polish or wax that makes floors slippery. If you must use wax, use non-skid floor wax.  Do not have throw rugs and other things on the floor that can make you trip. What can I do with my stairs?  Do not leave any items on the stairs.  Make sure that there are handrails on both sides of the stairs and use them. Fix handrails that are broken or loose. Make sure that handrails are as long as the stairways.  Check any carpeting to make sure that it is firmly attached to the stairs. Fix any carpet that is loose or worn.  Avoid having throw rugs at the top or bottom of the stairs. If you do have throw rugs, attach them to the floor with carpet tape.  Make sure that you have a light switch at the top of the stairs and the bottom of the stairs. If you do not have them, ask someone to add them for  you. What else can I do to help prevent falls?  Wear shoes that:  Do not have high heels.  Have rubber bottoms.  Are comfortable and fit you well.  Are closed at the toe. Do not wear sandals.  If you use a stepladder:  Make sure that it is fully opened. Do not climb a closed stepladder.  Make sure that both sides of the stepladder are locked into place.  Ask someone to hold it for you, if possible.  Clearly mark and make sure that you can see:  Any grab bars or handrails.  First and last steps.  Where the edge of each step is.  Use tools that help you move around (mobility aids) if they are needed. These include:  Canes.  Walkers.  Scooters.  Crutches.  Turn on the lights when you go into a dark area. Replace any light bulbs as soon as they burn out.  Set up your furniture so you have a clear path. Avoid moving your furniture around.  If any of your floors are uneven, fix them.  If there are any pets around you, be aware of where they are.  Review your medicines with your doctor. Some medicines can make you feel dizzy. This can increase your chance of falling. Ask your doctor what other things that you can do to help prevent falls. This information is not intended to replace advice given to you by your health care provider. Make sure you discuss any questions you have with your health care provider. Document Released: 08/25/2009 Document Revised: 04/05/2016 Document Reviewed: 12/03/2014 Elsevier Interactive Patient Education  2017 [employer].

## 2021-01-24 IMAGING — CT CT HEAD W/O CM
3 series · 15 of 47 positions shown, 18 images · non-contrast
Comparison: CT angiogram head/neck 09/04/2019, noncontrast head CT
09/04/2019

CLINICAL DATA: Intracranial hemorrhage, known, follow-up.

EXAM:
CT HEAD WITHOUT CONTRAST
TECHNIQUE: Contiguous axial images were obtained from the base of the skull
through the vertex without intravenous contrast.

[Series 3: head 5.0 h30s · axial · 0.41mm/px · z∈[-100,+30]mm · 9 of 32 slices shown, 12 images]
[im 3/32  brain]
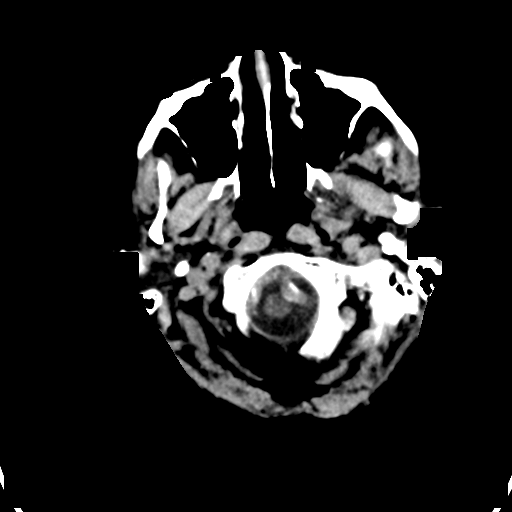
[im 3/32  bone]
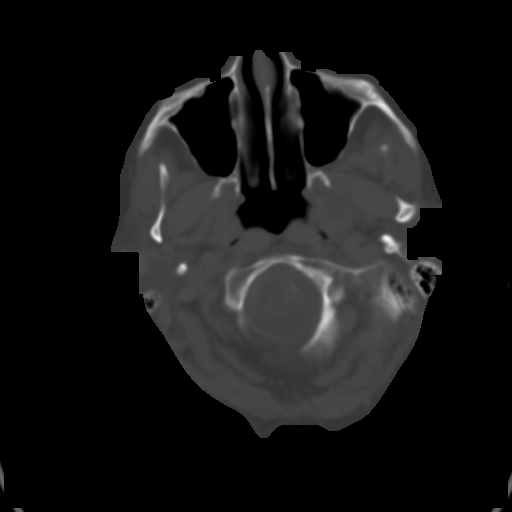
[im 6/32  brain]
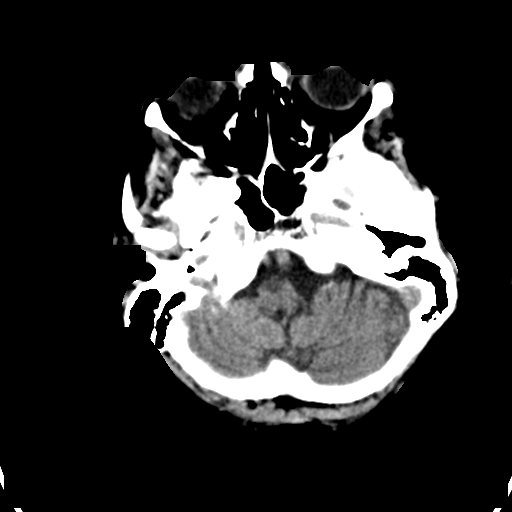
[im 9/32  brain]
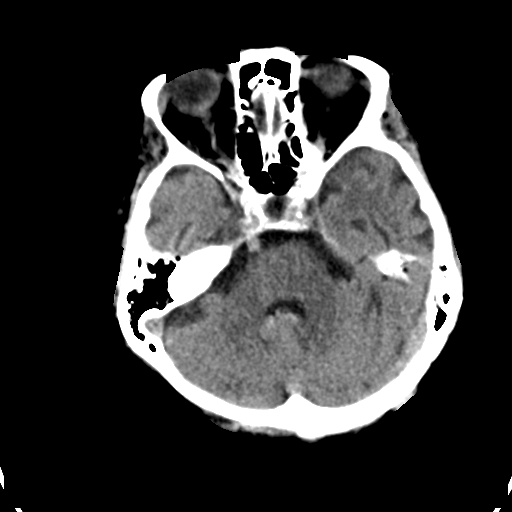
[im 12/32  brain]
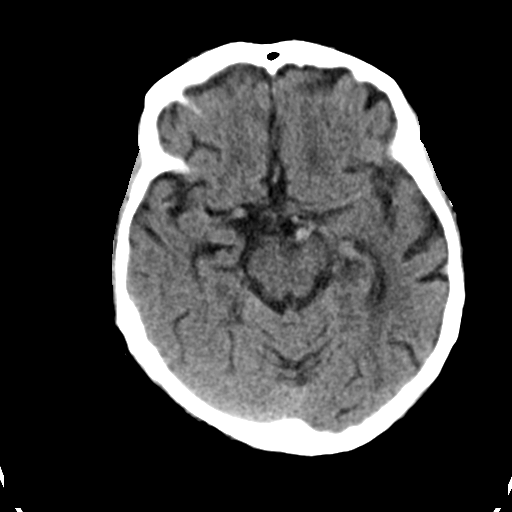
[im 17/32  brain]
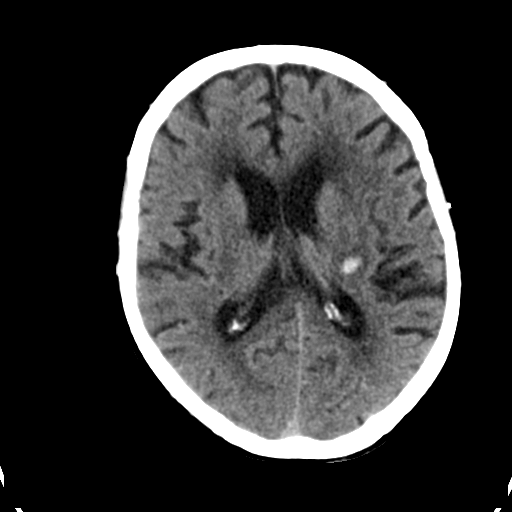
[im 17/32  bone]
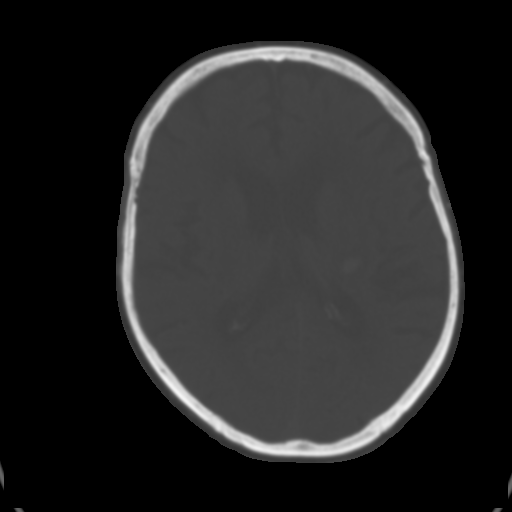
[im 20/32  brain]
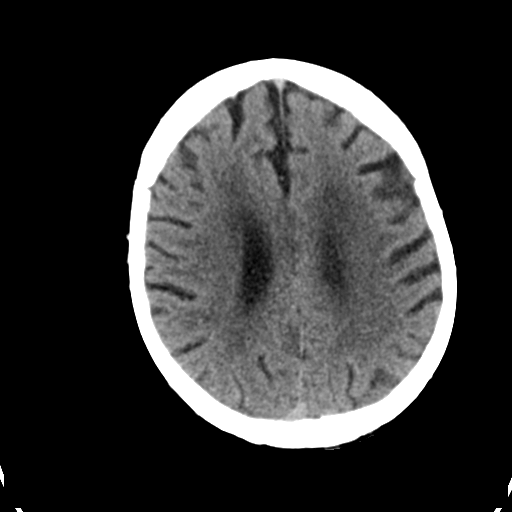
[im 23/32  brain]
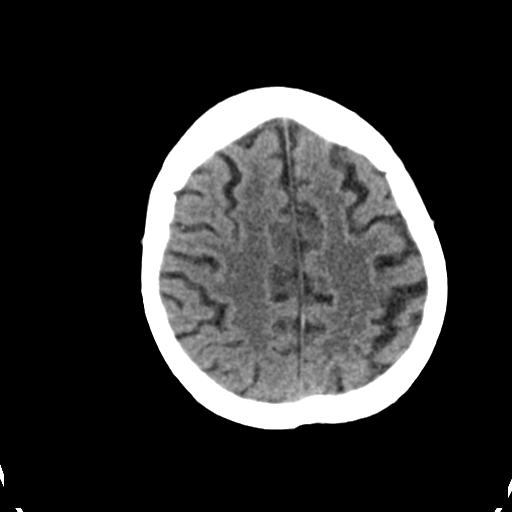
[im 26/32  brain]
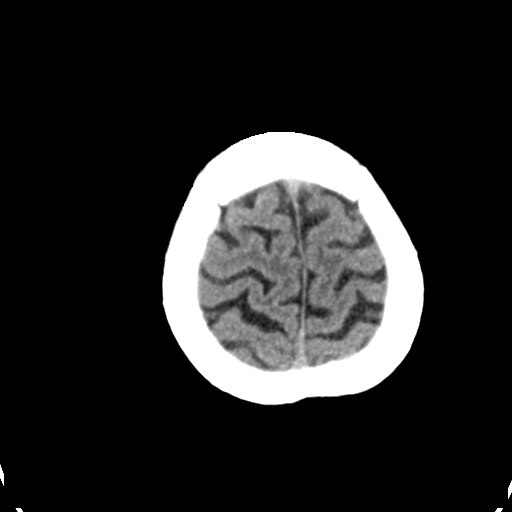
[im 29/32  brain]
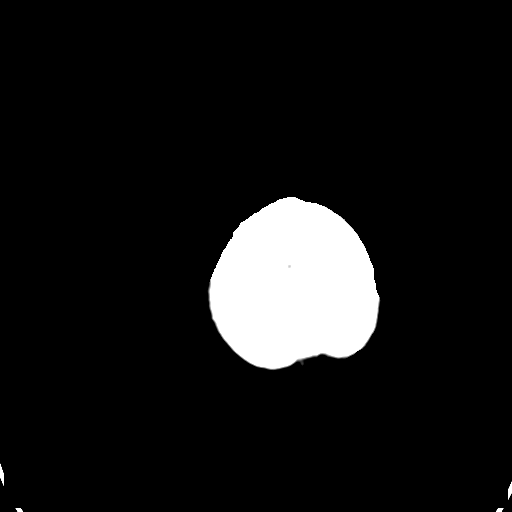
[im 29/32  bone]
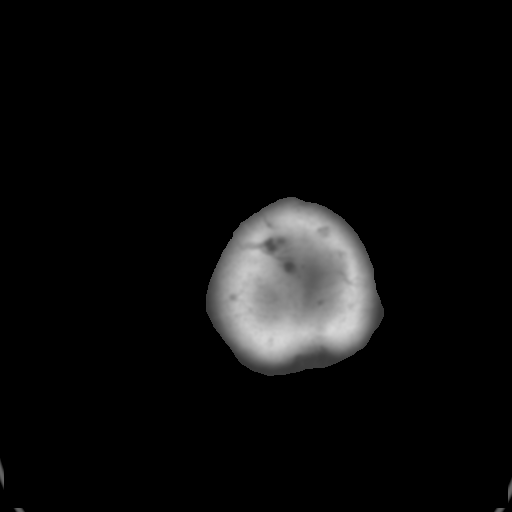

[Series 5: head 3.0 mpr cor · coronal · 0.32mm/px · 3 of 70 slices shown]
[im 24/70  brain]
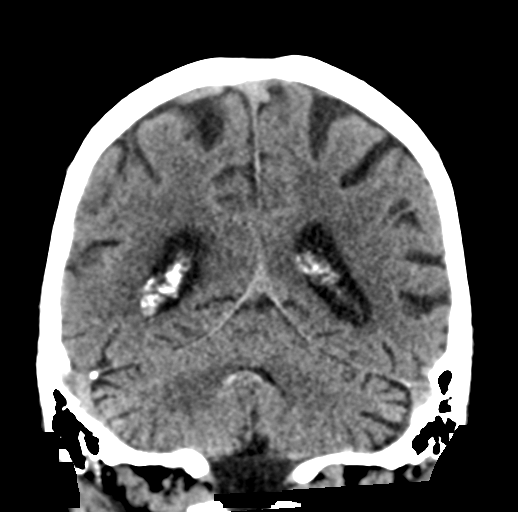
[im 31/70  brain]
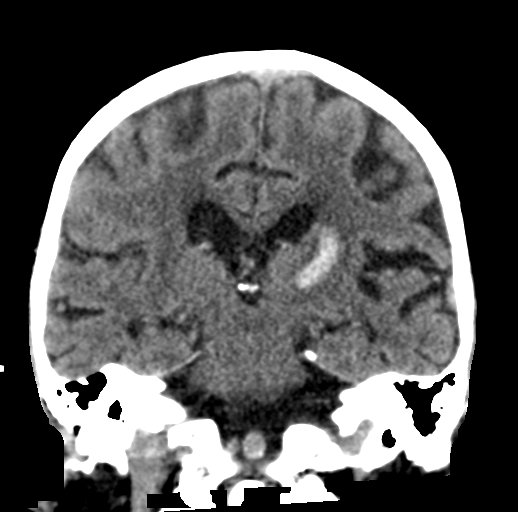
[im 39/70  brain]
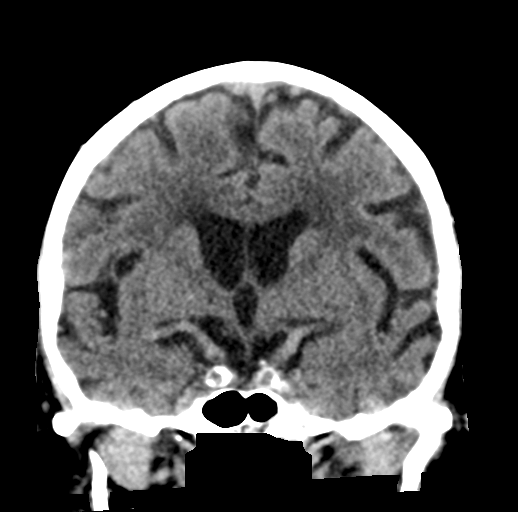

[Series 6: head 3.0 mpr sag · sagittal · 0.30mm/px · 3 of 55 slices shown]
[im 19/55  brain]
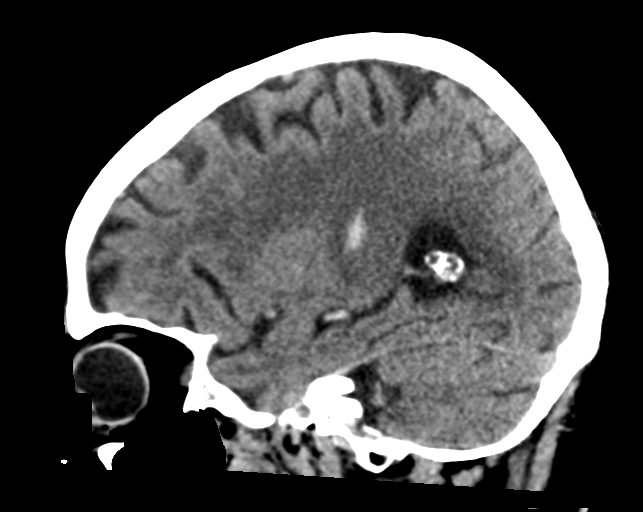
[im 28/55  brain]
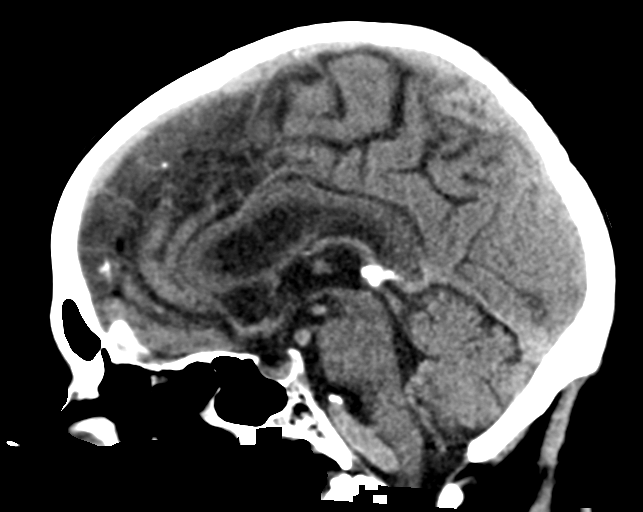
[im 37/55  brain]
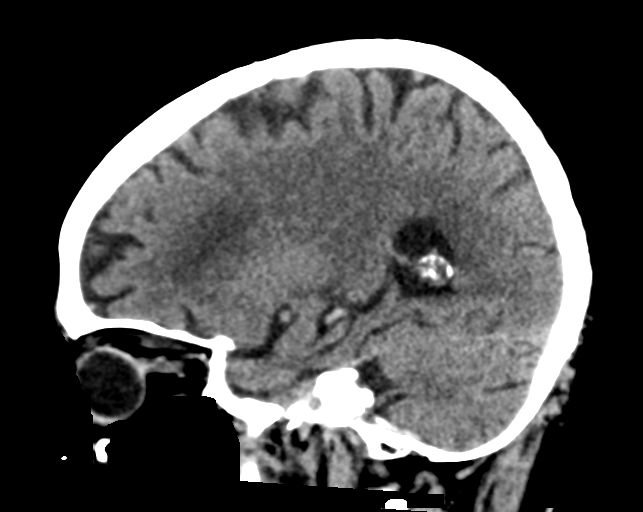

[15 of 47 positions shown; findings below may reference images not displayed]

FINDINGS: Brain:

An acute parenchymal hemorrhage within the left thalamocapsular
junction has slightly increased in size as compared to head CT
09/04/2019, measuring 6 x 12 x 22 mm on the current exam (previously
6 x 12 x 18 mm). Persistent mild surrounding edema. No new
intracranial hemorrhage elsewhere. No midline shift or significant
effacement of the ventricular system. No hydrocephalus.

No demarcated cortical infarct. Redemonstrated moderate generalized
parenchymal atrophy and chronic small vessel ischemic disease.

Vascular: No hyperdense vessel.  Atherosclerotic calcifications.

Skull: Negative

Sinuses/Orbits: Visualized orbits demonstrate no acute abnormality.
No significant paranasal sinus disease or mastoid effusion at the
imaged levels.
IMPRESSION: 1. Slight interval increase in size of an acute left thalamocapsular
junction parenchymal hemorrhage, now measuring 6 x 12 x 22 mm
(previously 6 x 12 x 18 mm).
2. Generalized parenchymal atrophy and chronic small vessel ischemic
disease.

## 2021-02-17 ENCOUNTER — Ambulatory Visit (INDEPENDENT_AMBULATORY_CARE_PROVIDER_SITE_OTHER): Payer: Medicare HMO | Admitting: Pharmacist

## 2021-02-17 ENCOUNTER — Other Ambulatory Visit: Payer: Self-pay

## 2021-02-17 DIAGNOSIS — Z952 Presence of prosthetic heart valve: Secondary | ICD-10-CM

## 2021-02-17 DIAGNOSIS — Z7901 Long term (current) use of anticoagulants: Secondary | ICD-10-CM | POA: Diagnosis not present

## 2021-02-17 DIAGNOSIS — Z5181 Encounter for therapeutic drug level monitoring: Secondary | ICD-10-CM

## 2021-02-17 LAB — POCT INR: INR: 2.6 (ref 2.0–3.0)

## 2021-02-17 NOTE — Patient Instructions (Signed)
Description   Continue same dosage of warfarin 1/2 tablet daily except for 1 tablet on Mondays, Wednesdays and Fridays. Recheck in 4 weeks. Call Coumadin clinic with questions [phone]. Jared fills pt's pill box, please call [phone].

## 2021-03-15 ENCOUNTER — Other Ambulatory Visit: Payer: Self-pay

## 2021-03-15 ENCOUNTER — Ambulatory Visit (INDEPENDENT_AMBULATORY_CARE_PROVIDER_SITE_OTHER): Payer: Medicare HMO

## 2021-03-15 DIAGNOSIS — Z7901 Long term (current) use of anticoagulants: Secondary | ICD-10-CM | POA: Diagnosis not present

## 2021-03-15 DIAGNOSIS — Z5181 Encounter for therapeutic drug level monitoring: Secondary | ICD-10-CM

## 2021-03-15 DIAGNOSIS — Z952 Presence of prosthetic heart valve: Secondary | ICD-10-CM | POA: Diagnosis not present

## 2021-03-15 LAB — POCT INR: INR: 2.1 (ref 2.0–3.0)

## 2021-03-15 NOTE — Patient Instructions (Signed)
Description   Take 1 tablet tomorrow, then resume same dosage of warfarin 1/2 tablet daily except for 1 tablet on Mondays, Wednesdays and Fridays. Recheck in 4 weeks. Call Coumadin clinic with questions [phone]. Jared fills pt's pill box, please call [phone].

## 2021-03-20 ENCOUNTER — Other Ambulatory Visit: Payer: Self-pay | Admitting: Family Medicine

## 2021-03-20 ENCOUNTER — Other Ambulatory Visit: Payer: Self-pay | Admitting: Interventional Cardiology

## 2021-04-12 ENCOUNTER — Other Ambulatory Visit: Payer: Self-pay

## 2021-04-12 ENCOUNTER — Ambulatory Visit (INDEPENDENT_AMBULATORY_CARE_PROVIDER_SITE_OTHER): Payer: Medicare HMO

## 2021-04-12 DIAGNOSIS — Z952 Presence of prosthetic heart valve: Secondary | ICD-10-CM

## 2021-04-12 DIAGNOSIS — Z7901 Long term (current) use of anticoagulants: Secondary | ICD-10-CM

## 2021-04-12 DIAGNOSIS — Z5181 Encounter for therapeutic drug level monitoring: Secondary | ICD-10-CM

## 2021-04-12 LAB — POCT INR: INR: 3.7 — AB (ref 2.0–3.0)

## 2021-04-12 NOTE — Patient Instructions (Signed)
Description   Skip tomorrow's dosage of Warfarin, then resume same dosage of warfarin 1/2 tablet daily except for 1 tablet on Mondays, Wednesdays and Fridays. Recheck in 2 weeks. Call Coumadin clinic with questions [phone]. Jared fills pt's pill box, please call [phone].

## 2021-04-25 ENCOUNTER — Other Ambulatory Visit: Payer: Self-pay

## 2021-04-25 ENCOUNTER — Ambulatory Visit (INDEPENDENT_AMBULATORY_CARE_PROVIDER_SITE_OTHER): Payer: Medicare HMO | Admitting: *Deleted

## 2021-04-25 DIAGNOSIS — Z5181 Encounter for therapeutic drug level monitoring: Secondary | ICD-10-CM | POA: Diagnosis not present

## 2021-04-25 DIAGNOSIS — Z952 Presence of prosthetic heart valve: Secondary | ICD-10-CM

## 2021-04-25 DIAGNOSIS — Z7901 Long term (current) use of anticoagulants: Secondary | ICD-10-CM | POA: Diagnosis not present

## 2021-04-25 LAB — POCT INR: INR: 1.6 — AB (ref 2.0–3.0)

## 2021-04-25 NOTE — Patient Instructions (Signed)
Description   Today take an extra 1/2 tablet and tomorrow take 1.5 tablets then continue taking 1/2 tablet  daily except for 1 tablet on Mondays, Wednesdays and Fridays. Recheck in 2 weeks. Call Coumadin clinic with questions [phone]. Jared fills pt's pill box, please call [phone].

## 2021-05-03 ENCOUNTER — Other Ambulatory Visit: Payer: Self-pay | Admitting: Family Medicine

## 2021-05-09 ENCOUNTER — Ambulatory Visit (INDEPENDENT_AMBULATORY_CARE_PROVIDER_SITE_OTHER): Payer: Medicare HMO | Admitting: *Deleted

## 2021-05-09 ENCOUNTER — Other Ambulatory Visit: Payer: Self-pay

## 2021-05-09 DIAGNOSIS — Z952 Presence of prosthetic heart valve: Secondary | ICD-10-CM | POA: Diagnosis not present

## 2021-05-09 DIAGNOSIS — Z5181 Encounter for therapeutic drug level monitoring: Secondary | ICD-10-CM

## 2021-05-09 DIAGNOSIS — Z7901 Long term (current) use of anticoagulants: Secondary | ICD-10-CM

## 2021-05-09 LAB — POCT INR: INR: 2.1 (ref 2.0–3.0)

## 2021-05-09 NOTE — Patient Instructions (Addendum)
Description   Take 1 tablet of Warfarin today and then START taking warfarin 1 tablet daily except for 1/2 a tablet Tuesday, Thursday and Saturday.  Recheck INR in 2 weeks. Coumadin Clinic [phone]. Call Jared with Warfarin instructions. [phone]

## 2021-05-24 ENCOUNTER — Other Ambulatory Visit: Payer: Self-pay

## 2021-05-24 ENCOUNTER — Ambulatory Visit (INDEPENDENT_AMBULATORY_CARE_PROVIDER_SITE_OTHER): Payer: Medicare HMO | Admitting: *Deleted

## 2021-05-24 DIAGNOSIS — Z5181 Encounter for therapeutic drug level monitoring: Secondary | ICD-10-CM

## 2021-05-24 DIAGNOSIS — Z7901 Long term (current) use of anticoagulants: Secondary | ICD-10-CM

## 2021-05-24 DIAGNOSIS — Z952 Presence of prosthetic heart valve: Secondary | ICD-10-CM | POA: Diagnosis not present

## 2021-05-24 LAB — POCT INR: INR: 2.4 (ref 2.0–3.0)

## 2021-05-24 NOTE — Patient Instructions (Addendum)
Description   Instructed for pt to take warfarin 1 tablet tomorrow and then continue to take warfarin 1 tablet daily except for 1/2 a tablet on Tuesday, Thursday and Saturday. Recheck INR in 3 weeks. Coumadin Clinic [phone]. Please call jared with dosing instructions [phone]

## 2021-05-30 NOTE — Progress Notes (Addendum)
Assessment/Plan:    Mixed Alzheimer's and Vascular Dementia  This is a very pleasant 87 year old right-handed woman with  a history of aortic valve replacement on anticoagulation with Coumadin, hypertension, cervical cancer, left thalamic hemorrhage, and a diagnosis of mixed Alzheimer's and vascular dementia, with last MMSE at 19/30. There is continued progression, with report of fogginess since recent fall with head trauma on 05/29/21. In view of her prior history of the stroke, and long-term anticoagulation, the recommendations are as follows:  STAT CT head without contrast to rule out bleeding and other structural abnormalities Continue Memantine 10 mg twice daily. Side effects were discussed  Discussed safety both in and out of the home. Continue using her walker. May need PT for strength.  Discussed the importance of regular daily schedule  Continue to monitor mood  Follow up in 6  months.   Case discussed with Dr. Delice Lesch who agrees with the plan    Subjective:   ED visits since last seen: none  Hospital admissions: none Procedures: She had debridement of the left foot due to acute osteomyelitis, cellulitis and abscess 07/14/2020  Virginia Franklin is a 87 y.o. right-handed female with mixed Alzheimer's and vascular dementia seen today in follow up.  She was last seen at our office on 04/06/2020. this patient is accompanied in the office by her daughter Virginia Franklin who supplements the history.  Previous records as well as any outside records available were reviewed prior to todays visit.  Patient is currently on memantine 10 mg twice daily.  History is mostly obtained by the daughter.  The patient is not aware of any memory changes.  Virginia Franklin states that her memory has worsened, she "forgets everything within a few minutes ". "Some days are more clear than others ". The patient lives in [facility], and has a Actuary every morning, who also monitors the medications.  The patient  prepares her breakfast and lunch, although she does not use the stove.  Her daughter provides her with food as well.  Dinner is given by the facility.  She also likes to go out with her boyfriend to restaurants, "because the food is better ".  Of note, her daughter reports that she has been hoarding packages of saltines, and massive amounts.  She has not been drinking enough water. Denies trouble swallowing.   Her mood overall is good, without depression or irritability.  She sleeps about 8 or 9 hours a night, without vivid dreams or sleepwalking. She does not like to use the walker, so she has to be forced to do so.  Her daughter monitors her from a digital video, concerned that she may wander off, like the day she left to [employer] by foot, without her walker, made it to halfway when a nurse who works at [employer], spotted her and brought her back to the facility. She had 1 episode of fall 2 days ago, when she "felt funny, had "fogginess in the head"  losing her balance and hitting and right temporal maxillary area and R lip.  No loss of consciousness or seizure activity.  No unilateral weakness or tremors, focal numbness or tingling, or vision changes.  She denies any headaches other than the soreness sustained after the fall.  EMS came, and she did not want to go to the hospital.  Denies urine incontinence or retention, constipation or diarrhea.She no longer drives. Her daughter is in charge of the finances and the driving .  History on Initial Assessment 01/28/2018: This is an 87 year old right-handed woman with a history of aortic valve replacement on anticoagulation with Coumadin, hypertension, cervical cancer, presenting after hospitalization on 12/16/17 for altered mental status.  Records were reviewed. Patient had reported that a few days prior to admission, she had an episode where she was unable to recall different things in the house. This lasted for a few hours then resolved. On 12/16/17,  she was at the Coumadin clinic having her INR checked, when she had an episode where she was reported to zone out and was non-verbal. She did not recall the episode. She recalls having her finger pricked, she denies feeling dizzy or any other warning symptoms. Per notes it lasted 45 seconds, but her daughter reports she was still not back to baseline when she arrived in the hospital. She could not say what year it was or who the president is. Her daughter had reported that over the past 2 years, she has had around 5 episodes of confusion and inability to recall events. She saw a neurologist in [place], per daughter workup showed electrolyte abnormalities and prior strokes. These usually occur upon awakening, it takes her 20-30 minutes to get her bearings. Her daughter has not witnessed them, but her son had reported that she was not quite herself. She drank her electrolyte drink and within 20 minutes was back to normal. At [facility], she had an MRI brain without contrast which I personally reviewed, no acute changes, there was moderate chronic microvascular disease and volume loss. Her wake and sleep EEG was within normal limits. It was felt that she had some underlying dementia and Aricept was recommended, she has not started this.   She moved to [facility] in October 2018 and has been living in a retirement community at [facility] [facility]. Her daughter reported progressive memory problems over the past 5-7 years, worse in the past couple of years. She has gotten lost driving a couple of times and has stopped driving since moving to [place]. She feels her memory is not good, she is forgetting quite a bit. Her daughter took over her finances in November 2018. She manages her own medications, although her daughter reports that one time she checked and a couple of things were off with her medication, she missed an entire day of her pillbox. Her daughter comes twice a week to visit.   She has occasional vertigo from inner ear issues.  She has occasional trouble swallowing, neck and back pain, urinary incontinence, constipation. She has occasional tremor on her right hand when painting. She denies any headaches, dizziness, diplopia, dysarthria, focal numbness/tingling/weakness, anosmia. She still feels that her thinking is not straight. She is still forgetting things and repeating conversations. Se denies any olfactory/gustatory hallucinations, deja vu, rising epigastric sensation, focal numbness/tingling/weakness, myoclonic jerks. She had a normal birth and early development.  There is no history of febrile convulsions, CNS infections such as meningitis/encephalitis, significant traumatic brain injury, neurosurgical procedures, or family history of seizures.   Diagnostic Data: Neuropsychological evaluation in 04/2018 showed mild dementia, most likely due to Alzheimer's disease, but cannot rule out mixed dementia (Alzheimer's and vascular dementia). MRI brain without contrast done 12/2017 no acute changes, there was moderate diffuse volume loss and moderate chronic microvascular disease.  PREVIOUS MEDICATIONS:   CURRENT MEDICATIONS:  Outpatient Encounter Medications as of 05/31/2021  Medication Sig   escitalopram (LEXAPRO) 5 MG tablet Take 1 tablet (5 mg total) by mouth daily.   ezetimibe-simvastatin (VYTORIN) 10-40  MG tablet Take 1 tablet by mouth daily.   furosemide (LASIX) 20 MG tablet Take 1 tablet (20 mg total) by mouth daily.   lisinopril (ZESTRIL) 10 MG tablet TAKE 1 TABLET DAILY (NEED AN APPOINTMENT FOR FURTHER REFILLS)   memantine (NAMENDA) 10 MG tablet Take 1 tablet (10 mg total) by mouth 2 (two) times daily.   solifenacin (VESICARE) 5 MG tablet Take 1 tablet (5 mg total) by mouth daily.   traMADol (ULTRAM) 50 MG tablet Take 2 tablets (100 mg total) by mouth every 6 (six) hours as needed.   warfarin (COUMADIN) 7.5 MG tablet TAKE ONE-HALF (1/2) TABLET TO 1 TABLET DAILY AS DIRECTED BY THE COUMADIN CLINIC   No  facility-administered encounter medications on file as of 05/31/2021.     Objective:     PHYSICAL EXAMINATION:    VITALS:   Vitals:   05/31/21 1030  BP: (!) 156/88  Pulse: 61  SpO2: 94%  Weight: 127 lb 9.6 oz (57.9 kg)    GEN:  The patient appears stated age and is in NAD. HEENT:  Normocephalic, atraumatic.   Neurological examination:  General: NAD, well-groomed, appears stated age. Orientation: The patient is alert. Oriented to person not to place or date, does not know year.  Cranial nerves: There is good facial symmetry. Big bruise in her right lower face near the lip and near the right eye, The speech is not fluent but clear  No aphasia or dysarthria. Fund of knowledge is reduced. Recent and remote memory are impaired. Attention and concentration are reduced.  Able to name objects unable to repeat phrases.  Hearing is intact to conversational tone.    Sensation: Sensation is intact to light touch throughout Motor: Strength is at least antigravity x4.     Montreal Cognitive Assessment  01/28/2018  Visuospatial/ Executive (0/5) 5  Naming (0/3) 3  Attention: Read list of digits (0/2) 1  Attention: Read list of letters (0/1) 1  Attention: Serial 7 subtraction starting at 100 (0/3) 3  Language: Repeat phrase (0/2) 0  Language : Fluency (0/1) 0  Abstraction (0/2) 1  Delayed Recall (0/5) 2  Orientation (0/6) 4  Total 20     MMSE - Mini Mental State Exam 04/06/2020  Orientation to time 1  Orientation to Place 1  Registration 3  Attention/ Calculation 5  Recall 0  Language- name 2 objects 2  Language- repeat 1  Language- follow 3 step command 3  Language- read & follow direction 1  Write a sentence 1  Copy design 1  Total score 19      Movement examination: Tone: There is normal tone in the UE/LE Abnormal movements:  no tremor.  No myoclonus.  No asterixis.   Coordination:  There is no decremation with RAM's. Normal finger to nose  Gait and Station: The  patient has no difficulty arising out of a deep-seated chair without the use of the hands. The patient's stride length is good.  Gait is cautious and narrow.    CBC CBC Latest Ref Rng & Units 07/29/2020 04/06/2020 01/29/2020  WBC 4.0 - 10.5 K/uL - 5.6 6.3  Hemoglobin 12.0 - 15.0 g/dL 13.6 13.0 13.4  Hematocrit 36.0 - 46.0 % 40.0 39.2 40.6  Platelets 150.0 - 400.0 K/uL - 236.0 287     CMP Latest Ref Rng & Units 07/29/2020 04/06/2020 01/29/2020  Glucose 70 - 99 mg/dL 96 100(H) 102(H)  BUN 8 - 23 mg/dL 20 14 13   Creatinine 0.44 - 1.00  mg/dL 0.70 0.75 0.76  Sodium 135 - 145 mmol/L 142 137 137  Potassium 3.5 - 5.1 mmol/L 4.0 4.2 3.7  Chloride 98 - 111 mmol/L 104 102 103  CO2 19 - 32 mEq/L - 30 23  Calcium 8.4 - 10.5 mg/dL - 9.3 9.4  Total Protein 6.0 - 8.3 g/dL - 7.1 -  Total Bilirubin 0.2 - 1.2 mg/dL - 0.5 -  Alkaline Phos 39 - 117 U/L - 85 -  AST 0 - 37 U/L - 22 -  ALT 0 - 35 U/L - 17 -       Total time spent on today's visit was 45 minutes, including both face-to-face time and nonface-to-face time. Time included that spent on review of records (prior notes available to me/labs/imaging if pertinent), discussing treatment and goals, answering patient's questions and coordinating care.  Cc:  [name], MD Sharene Butters, PA-C

## 2021-05-31 ENCOUNTER — Ambulatory Visit (INDEPENDENT_AMBULATORY_CARE_PROVIDER_SITE_OTHER): Payer: Medicare HMO | Admitting: Physician Assistant

## 2021-05-31 ENCOUNTER — Other Ambulatory Visit: Payer: Self-pay

## 2021-05-31 ENCOUNTER — Encounter: Payer: Self-pay | Admitting: Physician Assistant

## 2021-05-31 ENCOUNTER — Ambulatory Visit
Admission: RE | Admit: 2021-05-31 | Discharge: 2021-05-31 | Disposition: A | Discharge status: Home / Self Care | Payer: Medicare HMO | Source: Ambulatory Visit | Attending: Physician Assistant | Admitting: Physician Assistant

## 2021-05-31 VITALS — BP 156/88 | HR 61 | Wt 127.6 lb

## 2021-05-31 DIAGNOSIS — F0150 Vascular dementia without behavioral disturbance: Secondary | ICD-10-CM | POA: Diagnosis not present

## 2021-05-31 DIAGNOSIS — G309 Alzheimer's disease, unspecified: Secondary | ICD-10-CM

## 2021-05-31 DIAGNOSIS — F0280 Dementia in other diseases classified elsewhere without behavioral disturbance: Secondary | ICD-10-CM

## 2021-05-31 NOTE — Patient Instructions (Signed)
1. CT head to rule out any bleeding after the fall   2. Continue all your medications, continue to closely monitor medications  3. Follow-up in 6 months, call for any changes  FALL PRECAUTIONS: Be cautious when walking. Scan the area for obstacles that may increase the risk of trips and falls. When getting up in the mornings, sit up at the edge of the bed for a few minutes before getting out of bed. Consider elevating the bed at the head end to avoid drop of blood pressure when getting up. Walk always in a well-lit room (use night lights in the walls). Avoid area rugs or power cords from appliances in the middle of the walkways. Use a walker or a cane if necessary and consider physical therapy for balance exercise. Get your eyesight checked regularly.  HOME SAFETY: Consider the safety of the kitchen when operating appliances like stoves, microwave oven, and blender. Consider having supervision and share cooking responsibilities until no longer able to participate in those. Accidents with firearms and other hazards in the house should be identified and addressed as well.  ABILITY TO BE LEFT ALONE: If patient is unable to contact 911 operator, consider using LifeLine, or when the need is there, arrange for someone to stay with patients. Smoking is a fire hazard, consider supervision or cessation. Risk of wandering should be assessed by caregiver and if detected at any point, supervision and safe proof recommendations should be instituted.   RECOMMENDATIONS FOR ALL PATIENTS WITH MEMORY PROBLEMS: 1. Continue to exercise (Recommend 30 minutes of walking everyday, or 3 hours every week) 2. Increase social interactions - continue going to [place] and enjoy social gatherings with friends and family 3. Eat healthy, avoid fried foods and eat more fruits and vegetables 4. Maintain adequate blood pressure, blood sugar, and blood cholesterol level. Reducing the risk of stroke and cardiovascular disease also helps  promoting better memory. 5. Avoid stressful situations. Live a simple life and avoid aggravations. Organize your time and prepare for the next day in anticipation. 6. Sleep well, avoid any interruptions of sleep and avoid any distractions in the bedroom that may interfere with adequate sleep quality 7. Avoid sugar, avoid sweets as there is a strong link between excessive sugar intake, diabetes, and cognitive impairment The Mediterranean diet has been shown to help patients reduce the risk of progressive memory disorders and reduces cardiovascular risk. This includes eating fish, eat fruits and green leafy vegetables, nuts like almonds and hazelnuts, walnuts, and also use olive oil. Avoid fast foods and fried foods as much as possible. Avoid sweets and sugar as sugar use has been linked to worsening of memory function.  There is always a concern of gradual progression of memory problems. If this is the case, then we may need to adjust level of care according to patient needs. Support, both to the patient and caregiver, should then be put into place.

## 2021-06-02 ENCOUNTER — Telehealth: Payer: Self-pay

## 2021-06-02 NOTE — Telephone Encounter (Signed)
Spoke with Virginia Franklin daughter Virginia Franklin that the results of the CT scan are normal, do notshow any new findings especially, no bleeding or fluid collection or stroke

## 2021-06-02 NOTE — Telephone Encounter (Signed)
-----   Message from Rondel Jumbo, PA-C sent at 05/31/2021  5:19 PM EDT ----- PLs inform Ms Rentfrow daughter Virginia Franklin that the results of the CT scan are normal, do not show any new findings especially, no bleeding or fluid collection or stroke. THank you

## 2021-06-05 ENCOUNTER — Other Ambulatory Visit: Payer: Self-pay | Admitting: Family Medicine

## 2021-06-16 NOTE — Progress Notes (Signed)
Cardiology Office Note:    Date:  06/19/2021   ID:  Virginia Franklin, DOB 1929-05-11, MRN [id number]  PCP:  [name], MD  Cardiologist:  Sinclair Grooms, MD   Referring MD: [name], MD   Chief Complaint  Patient presents with   Cardiac Valve Problem    Mechanical aortic valve     History of Present Illness:    Virginia Franklin is a 87 y.o. female with a hx of dementia, essential hypertension, embolic CVA, mechanical St. Jude aortic valve for rheumatic fever 1990, and chronic coumadin therapy seen for follow up.   She had at least 1 fall since I seen her.    She denies chest pain, palpitations, dyspnea, edema, fever, and chills.  Past Medical History:  Diagnosis Date   Alzheimer's dementia ([place]) 2017   Arthritis    OA   Body mass index (BMI) of 22.0-22.9 in adult    Cervical cancer ([facility])    had hysterctomy for   H/O osteoporosis    H/O: CVA (cerebrovascular accident) 2012   , HEMORRHAGIC 09-04-2019 ALSO   H/O: osteoarthritis    Hammertoe of left foot    History of urinary frequency    Hx of diverticulitis of colon    Hypertension    Lumbar stress fracture lL3, 01-2020    Past Surgical History:  Procedure Laterality Date   ABDOMINAL HYSTERECTOMY     CATARACT EXTRACTION Bilateral    HAMMER TOE SURGERY Left 07/29/2020   Procedure: HAMMER TOE REPAIR 2ND TOE LEFT;  Surgeon: [name], DPM;  Location: [place];  Service: Podiatry;  Laterality: Left;   INCISION AND DRAINAGE Bilateral 07/29/2020   Procedure: EXCISION OF WOUND OF LEFT SECOND TOE; RELEASE OF TENDONS LEFT THIRD TOE, RIGHT THIRD TOE;  Surgeon: [name], DPM;  Location: [place];  Service: Podiatry;  Laterality: Bilateral;   mechanical heart valve  1990   st jude done in [place]      Current Medications: Current Meds  Medication Sig   escitalopram (LEXAPRO) 5 MG tablet TAKE 1 TABLET DAILY    ezetimibe-simvastatin (VYTORIN) 10-40 MG tablet Take 1 tablet by mouth daily.   furosemide (LASIX) 20 MG tablet Take 1 tablet (20 mg total) by mouth daily.   lisinopril (ZESTRIL) 10 MG tablet TAKE 1 TABLET DAILY (NEED AN APPOINTMENT FOR FURTHER REFILLS)   memantine (NAMENDA) 10 MG tablet Take 1 tablet (10 mg total) by mouth 2 (two) times daily.   solifenacin (VESICARE) 5 MG tablet Take 1 tablet (5 mg total) by mouth daily.   traMADol (ULTRAM) 50 MG tablet Take 2 tablets (100 mg total) by mouth every 6 (six) hours as needed.   warfarin (COUMADIN) 7.5 MG tablet TAKE ONE-HALF (1/2) TABLET TO 1 TABLET DAILY AS DIRECTED BY THE COUMADIN CLINIC     Allergies:   Penicillins   Social History   Socioeconomic History   Marital status: Widowed    Spouse name: Not on file   Number of children: 2   Years of education: 14   Highest education level: Not on file  Occupational History   Not on file  Tobacco Use   Smoking status: Never   Smokeless tobacco: Never  Vaping Use   Vaping Use: Never used  Substance and Sexual Activity   Alcohol use: Yes    Comment: SOCIALLY   Drug use: No   Sexual activity: Not  on file    Comment: WIDOW  Other Topics Concern   Not on file  Social History Narrative   Lives alone in a retirement home.  Has 2 children.  Retired Haematologist.  Education: Probation officer school.    Right handed   Social Determinants of Health   Financial Resource Strain: Low Risk    Difficulty of Paying Living Expenses: Not hard at all  Food Insecurity: No Food Insecurity   Worried About Charity fundraiser in the Last Year: Never true   Ran Out of Food in the Last Year: Never true  Transportation Needs: No Transportation Needs   Lack of Transportation (Medical): No   Lack of Transportation (Non-Medical): No  Physical Activity: Inactive   Days of Exercise per Week: 0 days   Minutes of Exercise per Session: 0 min  Stress: No Stress Concern Present   Feeling of Stress : Not at all   Social Connections: Socially Isolated   Frequency of Communication with Friends and Family: Three times a week   Frequency of Social Gatherings with Friends and Family: More than three times a week   Attends Religious Services: Never   Marine scientist or Organizations: No   Attends Archivist Meetings: Never   Marital Status: Widowed     Family History: The patient's family history includes Arthritis in her father; Heart Problems in her father; Kidney disease in her mother; Lung disease in her brother. There is no history of Heart disease.  ROS:   Please see the history of present illness.    Decreased memory.  Also have blood work done.  No upcoming appointment with primary care.  All other systems reviewed and are negative.  EKGs/Labs/Other Studies Reviewed:    The following studies were reviewed today: 2D Doppler echocardiogram October 2020 IMPRESSIONS     1. Left ventricular ejection fraction, by visual estimation, is 60 to  65%. The left ventricle has normal function. Normal left ventricular size.  There is mildly increased left ventricular hypertrophy.   2. Abnormal septal motion consistent with post-operative status.   3. Left ventricular diastolic Doppler parameters are consistent with  impaired relaxation pattern of LV diastolic filling.   4. Global right ventricle has normal systolic function.The right  ventricular size is normal. No increase in right ventricular wall  thickness.   5. Left atrial size was normal.   6. Right atrial size was normal.   7. Mild mitral annular calcification.   8. The mitral valve is normal in structure. No evidence of mitral valve  regurgitation.   9. The tricuspid valve is grossly normal. Tricuspid valve regurgitation  was not visualized by color flow Doppler.  10. Bileaflet mechanical prosthesis in the aortic valve position.  11. Aortic valve regurgitation is trivial ("physiological" backwash) by  color flow Doppler.   12. Aortic prosthesis gradients are unchanged since 2019.  13. The pulmonic valve was not well visualized. Pulmonic valve  regurgitation is not visualized by color flow Doppler.  14. TR signal is inadequate for assessing pulmonary artery systolic  pressure.  15. The inferior vena cava is normal in size with greater than 50%  respiratory variability, suggesting right atrial pressure of 3 mmHg.   EKG:  EKG sinus rhythm, PACs, nonspecific T wave flattening.  Recent Labs: 07/29/2020: BUN 20; Creatinine, Ser 0.70; Hemoglobin 13.6; Potassium 4.0; Sodium 142  Recent Lipid Panel    Component Value Date/Time   CHOL 149 01/13/2018 1219   TRIG  63.0 01/13/2018 1219   HDL 72.40 01/13/2018 1219   CHOLHDL 2 01/13/2018 1219   VLDL 12.6 01/13/2018 1219   LDLCALC 64 01/13/2018 1219    Physical Exam:    VS:  BP 112/66   Pulse 71   Ht '5\' 7"'$  (1.702 m)   Wt 126 lb 12.8 oz (57.5 kg)   SpO2 95%   BMI 19.86 kg/m     Wt Readings from Last 3 Encounters:  06/19/21 126 lb 12.8 oz (57.5 kg)  05/31/21 127 lb 9.6 oz (57.9 kg)  04/25/21 128 lb 9.6 oz (58.3 kg)     GEN: Appears younger than stated age.  Accompanied by her granddaughter.. No acute distress HEENT: Normal NECK: No JVD. LYMPHATICS: No lymphadenopathy CARDIAC: 1/6 systolic murmur.  Mechanical valve closure sound.  RRR no gallop, or edema. VASCULAR:  Normal Pulses. No bruits. RESPIRATORY:  Clear to auscultation without rales, wheezing or rhonchi  ABDOMEN: Soft, non-tender, non-distended, No pulsatile mass, MUSCULOSKELETAL: No deformity  SKIN: Warm and dry NEUROLOGIC:  Alert and oriented x 3 PSYCHIATRIC:  Normal affect   ASSESSMENT:    1. H/O mechanical aortic valve replacement   2. Chronic anticoagulation   3. Essential hypertension   4. Altered mental status, unspecified altered mental status type   5. Hyperlipidemia LDL goal <70    PLAN:    In order of problems listed above:  Last echocardiogram performed in 2020.  No  change in auscultation.  Continue clinical follow-up.  Decided against echocardiogram. No bleeding on Coumadin anticoagulation.  CBC will be done today.  Reiterated the importance of notification if head trauma. Excellent blood pressure control.  Comprehensive metabolic panel will be obtained. Easy/pleasant personality.  Easy to have conversation with..  Needs to be prompted and conversation on occasion.   Continue Vytorin.  Liver panel when needs to be checked lipid panel will also be checked  Plan clinical follow-up in 1 year.   Medication Adjustments/Labs and Tests Ordered: Current medicines are reviewed at length with the patient today.  Concerns regarding medicines are outlined above.  Orders Placed This Encounter  Procedures   Lipid panel   Hepatic function panel   Basic metabolic panel   CBC   TSH   EKG 12-Lead   No orders of the defined types were placed in this encounter.   Patient Instructions  Medication Instructions:  Your physician recommends that you continue on your current medications as directed. Please refer to the Current Medication list given to you today.  *If you need a refill on your cardiac medications before your next appointment, please call your pharmacy*   Lab Work: BMET, Lipid, Liver, TSH and CBC today  If you have labs (blood work) drawn today and your tests are completely normal, you will receive your results only by: [place] (if you have MyChart) OR A paper copy in the mail If you have any lab test that is abnormal or we need to change your treatment, we will call you to review the results.   Testing/Procedures: None   Follow-Up: At [facility], you and your health needs are our priority.  As part of our continuing mission to provide you with exceptional heart care, we have created designated Provider Care Teams.  These Care Teams include your primary Cardiologist (physician) and Advanced Practice Providers (APPs -  Physician  Assistants and Nurse Practitioners) who all work together to provide you with the care you need, when you need it.  We recommend signing  up for the patient portal called "MyChart".  Sign up information is provided on this After Visit Summary.  MyChart is used to connect with patients for Virtual Visits (Telemedicine).  Patients are able to view lab/test results, encounter notes, upcoming appointments, etc.  Non-urgent messages can be sent to your provider as well.   To learn more about what you can do with MyChart, go to [email or web address].    Your next appointment:   1 year(s)  The format for your next appointment:   In Person  Provider:   You may see Sinclair Grooms, MD or one of the following Advanced Practice Providers on your designated Care Team:   Cecilie Kicks, NP   Other Instructions     Signed, Sinclair Grooms, MD  06/19/2021 4:03 PM    [place]

## 2021-06-19 ENCOUNTER — Ambulatory Visit (INDEPENDENT_AMBULATORY_CARE_PROVIDER_SITE_OTHER): Payer: Medicare HMO | Admitting: *Deleted

## 2021-06-19 ENCOUNTER — Ambulatory Visit (INDEPENDENT_AMBULATORY_CARE_PROVIDER_SITE_OTHER): Payer: Medicare HMO | Admitting: Interventional Cardiology

## 2021-06-19 ENCOUNTER — Other Ambulatory Visit: Payer: Self-pay

## 2021-06-19 ENCOUNTER — Encounter: Payer: Self-pay | Admitting: Interventional Cardiology

## 2021-06-19 VITALS — BP 112/66 | HR 71 | Ht 67.0 in | Wt 126.8 lb

## 2021-06-19 DIAGNOSIS — Z7901 Long term (current) use of anticoagulants: Secondary | ICD-10-CM

## 2021-06-19 DIAGNOSIS — Z952 Presence of prosthetic heart valve: Secondary | ICD-10-CM

## 2021-06-19 DIAGNOSIS — I10 Essential (primary) hypertension: Secondary | ICD-10-CM | POA: Diagnosis not present

## 2021-06-19 DIAGNOSIS — E785 Hyperlipidemia, unspecified: Secondary | ICD-10-CM

## 2021-06-19 DIAGNOSIS — Z5181 Encounter for therapeutic drug level monitoring: Secondary | ICD-10-CM

## 2021-06-19 DIAGNOSIS — R4182 Altered mental status, unspecified: Secondary | ICD-10-CM

## 2021-06-19 LAB — POCT INR: INR: 5.3 — AB (ref 2.0–3.0)

## 2021-06-19 NOTE — Patient Instructions (Signed)
Medication Instructions:  Your physician recommends that you continue on your current medications as directed. Please refer to the Current Medication list given to you today.  *If you need a refill on your cardiac medications before your next appointment, please call your pharmacy*   Lab Work: BMET, Lipid, Liver, TSH and CBC today  If you have labs (blood work) drawn today and your tests are completely normal, you will receive your results only by: [place] (if you have MyChart) OR A paper copy in the mail If you have any lab test that is abnormal or we need to change your treatment, we will call you to review the results.   Testing/Procedures: None   Follow-Up: At [facility], you and your health needs are our priority.  As part of our continuing mission to provide you with exceptional heart care, we have created designated Provider Care Teams.  These Care Teams include your primary Cardiologist (physician) and Advanced Practice Providers (APPs -  Physician Assistants and Nurse Practitioners) who all work together to provide you with the care you need, when you need it.  We recommend signing up for the patient portal called "MyChart".  Sign up information is provided on this After Visit Summary.  MyChart is used to connect with patients for Virtual Visits (Telemedicine).  Patients are able to view lab/test results, encounter notes, upcoming appointments, etc.  Non-urgent messages can be sent to your provider as well.   To learn more about what you can do with MyChart, go to [email or web address].    Your next appointment:   1 year(s)  The format for your next appointment:   In Person  Provider:   You may see Sinclair Grooms, MD or one of the following Advanced Practice Providers on your designated Care Team:   Cecilie Kicks, NP   Other Instructions

## 2021-06-19 NOTE — Patient Instructions (Addendum)
Description   Spoke with [name] & gave instructions. Do not take any warfarin tomorrow and no warfarin Wednesday then continue warfarin 1 tablet tomorrow and then continue taking warfarin 1 tablet daily except for 1/2 tablet on Tuesday, Thursday and Saturday. Recheck INR in 1 week. Coumadin Clinic [phone]. Please call [name] with dosing instructions [phone]

## 2021-06-20 LAB — BASIC METABOLIC PANEL
BUN/Creatinine Ratio: 21 (ref 12–28)
BUN: 20 mg/dL (ref 10–36)
CO2: 22 mmol/L (ref 20–29)
Calcium: 9.5 mg/dL (ref 8.7–10.3)
Chloride: 104 mmol/L (ref 96–106)
Creatinine, Ser: 0.94 mg/dL (ref 0.57–1.00)
Glucose: 101 mg/dL — ABNORMAL HIGH (ref 65–99)
Potassium: 4.4 mmol/L (ref 3.5–5.2)
Sodium: 144 mmol/L (ref 134–144)
eGFR: 58 mL/min/{1.73_m2} — ABNORMAL LOW (ref >59)

## 2021-06-20 LAB — HEPATIC FUNCTION PANEL
ALT: 10 IU/L (ref 0–32)
AST: 19 IU/L (ref 0–40)
Albumin: 4.3 g/dL (ref 3.5–4.6)
Alkaline Phosphatase: 90 IU/L (ref 44–121)
Bilirubin Total: 0.3 mg/dL (ref 0.0–1.2)
Bilirubin, Direct: <0.1 mg/dL (ref 0.00–0.40)
Total Protein: 7 g/dL (ref 6.0–8.5)

## 2021-06-20 LAB — CBC
Hematocrit: 39.7 % (ref 34.0–46.6)
Hemoglobin: 13.5 g/dL (ref 11.1–15.9)
MCH: 30.2 pg (ref 26.6–33.0)
MCHC: 34 g/dL (ref 31.5–35.7)
MCV: 89 fL (ref 79–97)
Platelets: 246 10*3/uL (ref 150–450)
RBC: 4.47 x10E6/uL (ref 3.77–5.28)
RDW: 12.5 % (ref 11.7–15.4)
WBC: 6 10*3/uL (ref 3.4–10.8)

## 2021-06-20 LAB — LIPID PANEL
Chol/HDL Ratio: 2.6 ratio (ref 0.0–4.4)
Cholesterol, Total: 160 mg/dL (ref 100–199)
HDL: 62 mg/dL (ref >39)
LDL Chol Calc (NIH): 78 mg/dL (ref 0–99)
Triglycerides: 111 mg/dL (ref 0–149)
VLDL Cholesterol Cal: 20 mg/dL (ref 5–40)

## 2021-06-20 LAB — TSH: TSH: 1.89 u[IU]/mL (ref 0.450–4.500)

## 2021-06-28 ENCOUNTER — Other Ambulatory Visit: Payer: Self-pay

## 2021-06-28 ENCOUNTER — Ambulatory Visit (INDEPENDENT_AMBULATORY_CARE_PROVIDER_SITE_OTHER): Payer: Medicare HMO

## 2021-06-28 DIAGNOSIS — Z952 Presence of prosthetic heart valve: Secondary | ICD-10-CM

## 2021-06-28 DIAGNOSIS — Z5181 Encounter for therapeutic drug level monitoring: Secondary | ICD-10-CM | POA: Diagnosis not present

## 2021-06-28 DIAGNOSIS — Z7901 Long term (current) use of anticoagulants: Secondary | ICD-10-CM | POA: Diagnosis not present

## 2021-06-28 LAB — POCT INR: INR: 3.6 — AB (ref 2.0–3.0)

## 2021-06-28 NOTE — Patient Instructions (Signed)
Description   Hold tomorrow's dose (already taken today's dose) and then continue taking warfarin 1 tablet daily except for 1/2 tablet on Tuesday, Thursday and Saturday. Recheck INR in 2 week. Coumadin Clinic [phone]. Please call [name] with dosing instructions [phone]

## 2021-07-07 ENCOUNTER — Encounter: Payer: Self-pay | Admitting: Family Medicine

## 2021-07-07 ENCOUNTER — Other Ambulatory Visit: Payer: Self-pay

## 2021-07-07 ENCOUNTER — Ambulatory Visit (INDEPENDENT_AMBULATORY_CARE_PROVIDER_SITE_OTHER): Payer: Medicare HMO

## 2021-07-07 DIAGNOSIS — Z5181 Encounter for therapeutic drug level monitoring: Secondary | ICD-10-CM

## 2021-07-07 DIAGNOSIS — Z952 Presence of prosthetic heart valve: Secondary | ICD-10-CM | POA: Diagnosis not present

## 2021-07-07 DIAGNOSIS — Z7901 Long term (current) use of anticoagulants: Secondary | ICD-10-CM | POA: Diagnosis not present

## 2021-07-07 LAB — POCT INR: INR: 3.5 — AB (ref 2.0–3.0)

## 2021-07-07 NOTE — Telephone Encounter (Signed)
I agree with her. She is on a low dose, so we can stop it without a taper. Have them stop the Lexapro immediately

## 2021-07-07 NOTE — Patient Instructions (Signed)
Description   Hold tomorrow's dose (already taken today's dose) and then start taking warfarin 1/2 tablet daily except for 1 tablet on Mondays, Wednesdays, and Fridays. Recheck INR in 2 weeks. Coumadin Clinic [phone]. Please call [name] with dosing instructions [phone]

## 2021-07-18 ENCOUNTER — Ambulatory Visit: Payer: Medicare HMO | Admitting: Neurology

## 2021-07-19 ENCOUNTER — Other Ambulatory Visit: Payer: Self-pay

## 2021-07-19 ENCOUNTER — Ambulatory Visit (INDEPENDENT_AMBULATORY_CARE_PROVIDER_SITE_OTHER): Payer: Medicare HMO | Admitting: *Deleted

## 2021-07-19 DIAGNOSIS — Z7901 Long term (current) use of anticoagulants: Secondary | ICD-10-CM | POA: Diagnosis not present

## 2021-07-19 DIAGNOSIS — Z5181 Encounter for therapeutic drug level monitoring: Secondary | ICD-10-CM | POA: Diagnosis not present

## 2021-07-19 DIAGNOSIS — Z952 Presence of prosthetic heart valve: Secondary | ICD-10-CM

## 2021-07-19 LAB — POCT INR: INR: 2.8 (ref 2.0–3.0)

## 2021-07-19 NOTE — Telephone Encounter (Signed)
Please set her up for a 30 minute OV to check her out

## 2021-07-19 NOTE — Patient Instructions (Signed)
Description   Continue taking warfarin 1/2 tablet daily except for 1 tablet on Mondays, Wednesdays, and Fridays. Recheck INR in 3 weeks. Coumadin Clinic [phone]. Please call [name] with dosing instructions [phone]

## 2021-08-09 ENCOUNTER — Other Ambulatory Visit: Payer: Self-pay

## 2021-08-09 ENCOUNTER — Ambulatory Visit (INDEPENDENT_AMBULATORY_CARE_PROVIDER_SITE_OTHER): Payer: Medicare HMO

## 2021-08-09 DIAGNOSIS — Z952 Presence of prosthetic heart valve: Secondary | ICD-10-CM | POA: Diagnosis not present

## 2021-08-09 DIAGNOSIS — Z7901 Long term (current) use of anticoagulants: Secondary | ICD-10-CM | POA: Diagnosis not present

## 2021-08-09 DIAGNOSIS — Z5181 Encounter for therapeutic drug level monitoring: Secondary | ICD-10-CM

## 2021-08-09 LAB — POCT INR: INR: 2.6 (ref 2.0–3.0)

## 2021-08-09 NOTE — Patient Instructions (Signed)
Description   Continue taking warfarin 1/2 tablet daily except for 1 tablet on Mondays, Wednesdays, and Fridays. Recheck INR in 4 weeks. Coumadin Clinic [phone]. Please call [name] with dosing instructions [phone]

## 2021-09-05 ENCOUNTER — Ambulatory Visit (INDEPENDENT_AMBULATORY_CARE_PROVIDER_SITE_OTHER): Payer: Medicare HMO | Admitting: Podiatry

## 2021-09-05 ENCOUNTER — Other Ambulatory Visit: Payer: Self-pay

## 2021-09-05 DIAGNOSIS — M2041 Other hammer toe(s) (acquired), right foot: Secondary | ICD-10-CM

## 2021-09-05 DIAGNOSIS — L84 Corns and callosities: Secondary | ICD-10-CM

## 2021-09-06 ENCOUNTER — Telehealth: Payer: Self-pay | Admitting: *Deleted

## 2021-09-06 ENCOUNTER — Ambulatory Visit (INDEPENDENT_AMBULATORY_CARE_PROVIDER_SITE_OTHER): Payer: Medicare HMO

## 2021-09-06 DIAGNOSIS — Z7901 Long term (current) use of anticoagulants: Secondary | ICD-10-CM

## 2021-09-06 DIAGNOSIS — Z952 Presence of prosthetic heart valve: Secondary | ICD-10-CM | POA: Diagnosis not present

## 2021-09-06 DIAGNOSIS — Z5181 Encounter for therapeutic drug level monitoring: Secondary | ICD-10-CM

## 2021-09-06 LAB — POCT INR: INR: 2.4 (ref 2.0–3.0)

## 2021-09-06 MED ORDER — CLINDAMYCIN HCL 300 MG PO CAPS: 300.0000 mg | ORAL_CAPSULE | Freq: Three times a day (TID) | ORAL | 0 refills | Status: AC

## 2021-09-06 NOTE — Telephone Encounter (Signed)
Virginia Franklin(daughter) is calling to request the antibiotic ,not at pharmacy,something that doesn't interfere w/ coumadin. please advise.

## 2021-09-06 NOTE — Patient Instructions (Signed)
Description   Take extra 0.5 tablet today and then continue taking warfarin 1/2 tablet daily except for 1 tablet on Mondays, Wednesdays, and Fridays. Recheck INR in 5 weeks. Coumadin Clinic [phone]. Please call [name] with dosing instructions [phone]

## 2021-09-08 NOTE — Telephone Encounter (Signed)
Returned call and spoke with daughter, giving information, said that she probably won't get to pick up until Sunday.

## 2021-09-11 NOTE — Progress Notes (Signed)
  Subjective:  Patient ID: Virginia Franklin, female    DOB: [birth year],  MRN: [id number]  Chief Complaint  Patient presents with   Hammer Toe   Nail Problem    87 y.o. female returns for follow-up with the above complaint. History confirmed with patient.  Here with h she is now having the same issue and pain on the right second toe no pain.  Objective:  Physical Exam: tenderness at the surgical site, local edema noted, calf supple, nontender and reduced HAV deformity. Onychomycosis x10 with incurvated nail borders.  Hammertoe deformity right third toe with hyperkeratosis medially and swelling and edema and redness of the toe.  No recurrence of ulceration left second toe    Radiographs left foot: good surgical correction maintained, some regrowth of bone at arthroplasty site Assessment:   1. Hammertoe of right foot   2. Callus of foot      Plan:  Patient was evaluated and treated and all questions answered.  Debride the overlying callus and recommended treatment with antibiotics for possibility of cellulitis considering her previous ulceration history here I prescribed her clindamycin.  I will see her back in 3 weeks and recommended offloading with a combined toe spacer.  Hopefully can avoid surgery for the side but may need arthroplasty like we did on the other side  Return in about 3 weeks (around 09/26/2021).

## 2021-09-20 ENCOUNTER — Other Ambulatory Visit: Payer: Self-pay | Admitting: Family Medicine

## 2021-09-28 ENCOUNTER — Other Ambulatory Visit: Payer: Self-pay

## 2021-09-28 ENCOUNTER — Ambulatory Visit (INDEPENDENT_AMBULATORY_CARE_PROVIDER_SITE_OTHER): Payer: Medicare HMO | Admitting: Podiatry

## 2021-09-28 DIAGNOSIS — L84 Corns and callosities: Secondary | ICD-10-CM | POA: Diagnosis not present

## 2021-09-28 DIAGNOSIS — M2041 Other hammer toe(s) (acquired), right foot: Secondary | ICD-10-CM | POA: Diagnosis not present

## 2021-10-02 NOTE — Progress Notes (Signed)
  Subjective:  Patient ID: Virginia Franklin, female    DOB: 04-11-1929,  MRN: [id number]  Chief Complaint  Patient presents with   Hammer Toe      right foot pain    87 y.o. female returns for follow-up with the above complaint. History confirmed with patient.  Unfortunately put the toe separators on and left him there for a whole week  Objective:  Physical Exam: tenderness at the surgical site, local edema noted, calf supple, nontender and reduced HAV deformity. Onychomycosis x10 with incurvated nail borders.  Hammertoe deformity right third toe with hyperkeratosis medially, cellulitis is resolved.  Blistering of toes resolving     Radiographs left foot: good surgical correction maintained, some regrowth of bone at arthroplasty site Assessment:   1. Hammertoe of right foot   2. Callus of foot      Plan:  Patient was evaluated and treated and all questions answered.  Today we discussed that she should avoid toe spacers at this point.  We discussed different shoe gear changes.  I am concerned with her cognitive defects that putting anything between the toes is going to be difficult.  She can put Vaseline on it on a significant amount.  Think the best thing for her to be to wear wide accommodative shoes that have stretching material that will not press the toes together.  Return in about 3 weeks (around 10/19/2021).

## 2021-10-13 ENCOUNTER — Other Ambulatory Visit: Payer: Self-pay

## 2021-10-13 ENCOUNTER — Ambulatory Visit (INDEPENDENT_AMBULATORY_CARE_PROVIDER_SITE_OTHER): Payer: Medicare HMO | Admitting: *Deleted

## 2021-10-13 DIAGNOSIS — Z5181 Encounter for therapeutic drug level monitoring: Secondary | ICD-10-CM

## 2021-10-13 DIAGNOSIS — Z952 Presence of prosthetic heart valve: Secondary | ICD-10-CM

## 2021-10-13 DIAGNOSIS — Z7901 Long term (current) use of anticoagulants: Secondary | ICD-10-CM

## 2021-10-13 LAB — POCT INR: INR: 2.4 (ref 2.0–3.0)

## 2021-10-13 NOTE — Patient Instructions (Signed)
Description    Take 1 tablet of warfarin tomorrow, then continue taking 1/2 tablet daily except for 1 tablet on Mondays, Wednesdays, and Fridays. Recheck INR in 4 weeks. Coumadin Clinic [phone]. Please call [name] with dosing instructions [phone]

## 2021-10-26 ENCOUNTER — Ambulatory Visit (INDEPENDENT_AMBULATORY_CARE_PROVIDER_SITE_OTHER): Payer: Medicare HMO | Admitting: Podiatry

## 2021-10-26 ENCOUNTER — Other Ambulatory Visit: Payer: Self-pay

## 2021-10-26 DIAGNOSIS — M2041 Other hammer toe(s) (acquired), right foot: Secondary | ICD-10-CM

## 2021-10-26 DIAGNOSIS — M79675 Pain in left toe(s): Secondary | ICD-10-CM | POA: Diagnosis not present

## 2021-10-26 DIAGNOSIS — M79674 Pain in right toe(s): Secondary | ICD-10-CM

## 2021-10-26 DIAGNOSIS — B351 Tinea unguium: Secondary | ICD-10-CM

## 2021-10-26 DIAGNOSIS — L84 Corns and callosities: Secondary | ICD-10-CM | POA: Diagnosis not present

## 2021-10-26 NOTE — Progress Notes (Signed)
°  Subjective:  Patient ID: Virginia Franklin, female    DOB: [birth year],  MRN: [id number]  Chief Complaint  Patient presents with   Hammer Toe    Right foot pain    87 y.o. female returns for follow-up with the above complaint. History confirmed with patient.  Seems to be doing a bit better  Objective:  Physical Exam: tenderness at the surgical site, local edema noted, calf supple, nontender and reduced HAV deformity. Onychomycosis x10 with incurvated nail borders.  Hammertoe deformity right third toe with minimal hyperkeratosis medially, blisters have healed   Radiographs left foot: good surgical correction maintained, some regrowth of bone at arthroplasty site Assessment:   1. Hammertoe of right foot   2. Callus of foot   3. Pain due to onychomycosis of toenails of both feet       Plan:  Patient was evaluated and treated and all questions answered.  Exogen much better today.  He was transition to a regularly scheduled care every 9 to 10 weeks for the thickened elongated nails and any calluses or corns that may arise.  Return to see me as needed before then if any new issues develop.  Today nails were trimmed as a courtesy so that we can transition to the regular schedule.  No follow-ups on file.

## 2021-11-08 ENCOUNTER — Ambulatory Visit (INDEPENDENT_AMBULATORY_CARE_PROVIDER_SITE_OTHER): Payer: Medicare HMO | Admitting: *Deleted

## 2021-11-08 ENCOUNTER — Other Ambulatory Visit: Payer: Self-pay

## 2021-11-08 DIAGNOSIS — Z7901 Long term (current) use of anticoagulants: Secondary | ICD-10-CM

## 2021-11-08 DIAGNOSIS — Z5181 Encounter for therapeutic drug level monitoring: Secondary | ICD-10-CM

## 2021-11-08 DIAGNOSIS — Z952 Presence of prosthetic heart valve: Secondary | ICD-10-CM | POA: Diagnosis not present

## 2021-11-08 LAB — POCT INR: INR: 2.7 (ref 2.0–3.0)

## 2021-11-08 NOTE — Patient Instructions (Signed)
Description   Continue taking 1/2 tablet daily except for 1 tablet on Mondays, Wednesdays, and Fridays. Recheck INR in 4 weeks. Coumadin Clinic [phone]. Please call [name] with dosing instructions [phone]

## 2021-11-27 ENCOUNTER — Other Ambulatory Visit: Payer: Self-pay | Admitting: Family Medicine

## 2021-11-28 NOTE — Telephone Encounter (Signed)
Spoke with pt daughter scheduled f/u appointment for BP check and medication refill, Dr stated that pt has enough to get her through her appointment with Dr Sarajane Jews

## 2021-12-06 ENCOUNTER — Encounter: Payer: Self-pay | Admitting: Family Medicine

## 2021-12-06 ENCOUNTER — Ambulatory Visit (INDEPENDENT_AMBULATORY_CARE_PROVIDER_SITE_OTHER): Payer: Medicare HMO | Admitting: Family Medicine

## 2021-12-06 ENCOUNTER — Ambulatory Visit (INDEPENDENT_AMBULATORY_CARE_PROVIDER_SITE_OTHER): Payer: Medicare HMO

## 2021-12-06 ENCOUNTER — Other Ambulatory Visit: Payer: Self-pay

## 2021-12-06 VITALS — BP 110/60 | HR 66 | Temp 97.9°F | Ht 67.0 in | Wt 133.4 lb

## 2021-12-06 DIAGNOSIS — I10 Essential (primary) hypertension: Secondary | ICD-10-CM | POA: Diagnosis not present

## 2021-12-06 DIAGNOSIS — Z952 Presence of prosthetic heart valve: Secondary | ICD-10-CM | POA: Diagnosis not present

## 2021-12-06 DIAGNOSIS — K5900 Constipation, unspecified: Secondary | ICD-10-CM

## 2021-12-06 DIAGNOSIS — Z7901 Long term (current) use of anticoagulants: Secondary | ICD-10-CM

## 2021-12-06 DIAGNOSIS — Z5181 Encounter for therapeutic drug level monitoring: Secondary | ICD-10-CM

## 2021-12-06 DIAGNOSIS — G300 Alzheimer's disease with early onset: Secondary | ICD-10-CM

## 2021-12-06 DIAGNOSIS — R5383 Other fatigue: Secondary | ICD-10-CM

## 2021-12-06 DIAGNOSIS — F02B0 Dementia in other diseases classified elsewhere, moderate, without behavioral disturbance, psychotic disturbance, mood disturbance, and anxiety: Secondary | ICD-10-CM

## 2021-12-06 LAB — CBC WITH DIFFERENTIAL/PLATELET
Basophils Absolute: 0 10*3/uL (ref 0.0–0.1)
Basophils Relative: 0.5 % (ref 0.0–3.0)
Eosinophils Absolute: 0.1 10*3/uL (ref 0.0–0.7)
Eosinophils Relative: 1.1 % (ref 0.0–5.0)
HCT: 41.5 % (ref 36.0–46.0)
Hemoglobin: 13.5 g/dL (ref 12.0–15.0)
Lymphocytes Relative: 16.2 % (ref 12.0–46.0)
Lymphs Abs: 1.1 10*3/uL (ref 0.7–4.0)
MCHC: 32.5 g/dL (ref 30.0–36.0)
MCV: 91.4 fl (ref 78.0–100.0)
Monocytes Absolute: 0.7 10*3/uL (ref 0.1–1.0)
Monocytes Relative: 10 % (ref 3.0–12.0)
Neutro Abs: 4.8 10*3/uL (ref 1.4–7.7)
Neutrophils Relative %: 72.2 % (ref 43.0–77.0)
Platelets: 211 10*3/uL (ref 150.0–400.0)
RBC: 4.54 Mil/uL (ref 3.87–5.11)
RDW: 14.2 % (ref 11.5–15.5)
WBC: 6.7 10*3/uL (ref 4.0–10.5)

## 2021-12-06 LAB — BASIC METABOLIC PANEL
BUN: 16 mg/dL (ref 6–23)
CO2: 32 mEq/L (ref 19–32)
Calcium: 9.6 mg/dL (ref 8.4–10.5)
Chloride: 100 mEq/L (ref 96–112)
Creatinine, Ser: 0.83 mg/dL (ref 0.40–1.20)
GFR: 62 mL/min (ref >60.00)
Glucose, Bld: 97 mg/dL (ref 70–99)
Potassium: 4 mEq/L (ref 3.5–5.1)
Sodium: 137 mEq/L (ref 135–145)

## 2021-12-06 LAB — HEPATIC FUNCTION PANEL
ALT: 10 U/L (ref 0–35)
AST: 18 U/L (ref 0–37)
Albumin: 4.3 g/dL (ref 3.5–5.2)
Alkaline Phosphatase: 84 U/L (ref 39–117)
Bilirubin, Direct: 0.1 mg/dL (ref 0.0–0.3)
Total Bilirubin: 0.6 mg/dL (ref 0.2–1.2)
Total Protein: 7.9 g/dL (ref 6.0–8.3)

## 2021-12-06 LAB — TSH: TSH: 3.26 u[IU]/mL (ref 0.35–5.50)

## 2021-12-06 LAB — POCT INR: INR: 3 (ref 2.0–3.0)

## 2021-12-06 LAB — VITAMIN B12: Vitamin B-12: 475 pg/mL (ref 211–911)

## 2021-12-06 NOTE — Patient Instructions (Signed)
Description   Continue taking 1/2 tablet daily except for 1 tablet on Mondays, Wednesdays, and Fridays. Recheck INR in 5 weeks. Coumadin Clinic [phone]. Please call [name] with dosing instructions [phone]

## 2021-12-06 NOTE — Progress Notes (Signed)
° °  Subjective:    Patient ID: Virginia Franklin, female    DOB: 1929/08/11, 88 y.o.   MRN: [id number]  HPI Here with her daughter to follow up on issues. First she wants to check her BP. Also she tends to be constipated, and her daughter says she drinks very little water. She has tried [employer] and other flavored waters, but Virginia Franklin refuses to drink most of them. Her daughter says she only eats one god meal a day, but she does drink at least one Ensure every day. I see from her chart she has gained 7 lbs since her last visit here. The daughter says Virginia Franklin's memory continues to worsen, but she realizes this is inevitable. She still takes [place]. Lastly Virginia Franklin has complained about excessive tiredness for the past 2 months. She sleeps well at night.    Review of Systems  Constitutional:  Positive for fatigue.  Respiratory: Negative.    Cardiovascular: Negative.   Gastrointestinal:  Positive for constipation. Negative for abdominal distention, abdominal pain, blood in stool and nausea.      Objective:   Physical Exam Constitutional:      Appearance: Normal appearance. She is not ill-appearing.  Cardiovascular:     Rate and Rhythm: Normal rate and regular rhythm.     Pulses: Normal pulses.     Heart sounds: Normal heart sounds.     Comments: Occasional ectopy  Pulmonary:     Effort: Pulmonary effort is normal.     Breath sounds: Normal breath sounds.  Neurological:     Mental Status: She is alert. Mental status is at baseline.          Assessment & Plan:  Her HTN is well controlled. For the constipation, I suggested she add Pedialyte to the water. There are several flavors to choose from. The dementia will continue to progress and they both know this. I advised her to keep her brain active by reading, do jigsaw puzzles, etc. As for the tiredness, we will get labs to check for anemia, B12 level, etc. We spent a total of (  32 ) minutes reviewing records and discussing these  issues.  Alysia Penna, MD  .

## 2021-12-19 ENCOUNTER — Other Ambulatory Visit: Payer: Self-pay

## 2021-12-19 ENCOUNTER — Encounter: Payer: Self-pay | Admitting: Neurology

## 2021-12-19 ENCOUNTER — Ambulatory Visit (INDEPENDENT_AMBULATORY_CARE_PROVIDER_SITE_OTHER): Payer: Medicare HMO | Admitting: Neurology

## 2021-12-19 VITALS — BP 150/61 | HR 72 | Resp 18 | Ht 67.0 in | Wt 128.0 lb

## 2021-12-19 DIAGNOSIS — F0280 Dementia in other diseases classified elsewhere without behavioral disturbance: Secondary | ICD-10-CM | POA: Diagnosis not present

## 2021-12-19 DIAGNOSIS — G309 Alzheimer's disease, unspecified: Secondary | ICD-10-CM | POA: Diagnosis not present

## 2021-12-19 DIAGNOSIS — F0150 Vascular dementia without behavioral disturbance: Secondary | ICD-10-CM

## 2021-12-19 NOTE — Patient Instructions (Signed)
Good to see you. Continue Memantine 10mg  twice a day. Recommend having increased supervision at home. Increase hydration. Follow-up in 6 months, call for any changes  FALL PRECAUTIONS: Be cautious when walking. Scan the area for obstacles that may increase the risk of trips and falls. When getting up in the mornings, sit up at the edge of the bed for a few minutes before getting out of bed. Consider elevating the bed at the head end to avoid drop of blood pressure when getting up. Walk always in a well-lit room (use night lights in the walls). Avoid area rugs or power cords from appliances in the middle of the walkways. Use a walker or a cane if necessary and consider physical therapy for balance exercise. Get your eyesight checked regularly.  HOME SAFETY: Consider the safety of the kitchen when operating appliances like stoves, microwave oven, and blender. Consider having supervision and share cooking responsibilities until no longer able to participate in those. Accidents with firearms and other hazards in the house should be identified and addressed as well.   ABILITY TO BE LEFT ALONE: If patient is unable to contact 911 operator, consider using LifeLine, or when the need is there, arrange for someone to stay with patients. Smoking is a fire hazard, consider supervision or cessation. Risk of wandering should be assessed by caregiver and if detected at any point, supervision and safe proof recommendations should be instituted.  RECOMMENDATIONS FOR ALL PATIENTS WITH MEMORY PROBLEMS: 1. Continue to exercise (Recommend 30 minutes of walking everyday, or 3 hours every week) 2. Increase social interactions - continue going to [place] and enjoy social gatherings with friends and family 3. Eat healthy, avoid fried foods and eat more fruits and vegetables 4. Maintain adequate blood pressure, blood sugar, and blood cholesterol level. Reducing the risk of stroke and cardiovascular disease also helps promoting  better memory. 5. Avoid stressful situations. Live a simple life and avoid aggravations. Organize your time and prepare for the next day in anticipation. 6. Sleep well, avoid any interruptions of sleep and avoid any distractions in the bedroom that may interfere with adequate sleep quality 7. Avoid sugar, avoid sweets as there is a strong link between excessive sugar intake, diabetes, and cognitive impairment The Mediterranean diet, which has been shown to help patients reduce the risk of progressive memory disorders and reduces cardiovascular risk. This includes eating fish, eat fruits and green leafy vegetables, nuts like almonds and hazelnuts, walnuts, and also use olive oil. Avoid fast foods and fried foods as much as possible. Avoid sweets and sugar as sugar use has been linked to worsening of memory function.  There is always a concern of gradual progression of memory problems. If this is the case, then we may need to adjust level of care according to patient needs. Support, both to the patient and caregiver, should then be put into place.

## 2021-12-19 NOTE — Progress Notes (Signed)
NEUROLOGY FOLLOW UP OFFICE NOTE  Virginia Franklin [id number] 08/19/1929  HISTORY OF PRESENT ILLNESS: I had the pleasure of seeing Virginia Franklin in follow-up in the neurology clinic on 12/19/2021.  The patient was last seen 6 months ago for mixed dementia. She is again accompanied by her daughter Virginia Franklin who helps supplement the history today.  Records and images were personally reviewed where available.  On her last visit, she had a fall hitting her face, she had a head CT without contrast in 05/2021 with no acute changes, moderate chronic microvascular disease. She is on Memantine 10mg  BID. She had side effects on Donepezil. She continues to stay in [place] at [facility], however needs increasing supervision. Her daughter will be looking into a daily aide. Staff comes to give her medications daily, she says she does not take any morning medication. There are days she does not bathe or change clothes for 3 days, and would get upset when family/friends mention it. She has stopped wearing makeup. She is needing more help with bathing, unable to remember if she already took a bath. She does not remember to eat or drink, sometimes her mouth is so dry and Virginia Franklin notices she is more confused, improving after hydration. She was previously on Lexapro, stopped due to concern it was making her dehydrated. There have been 3 instances where she did not know where her apartment was, she refused to go up with staff at 11pm and they had to call her daughter to convince her. She said she did not live there, she did not recognize her place. Last summer she walked across the street to [employer], staff was able to see her and are now more vigilant. Her daughter would like her to stay in [place] with increased supervision instead of move to ALF/memory care for now. She does get upset more easily, there may have been an instance of hallucination where she mentioned seeing someone at dinner from  [place] which upset/agitated her. She has a boyfriend who brings her to the dining area for dinner She is tired all the time, staying in bed more. There were 2 days where she had more energy, but otherwise she does not want to do much.     History on Initial Assessment 01/28/2018: This is an 88 year old right-handed woman with a history of aortic valve replacement on anticoagulation with Coumadin, hypertension, cervical cancer, presenting after hospitalization on 12/16/17 for altered mental status.  Records were reviewed. Patient had reported that a few days prior to admission, she had an episode where she was unable to recall different things in the house. This lasted for a few hours then resolved. On 12/16/17, she was at the Coumadin clinic having her INR checked, when she had an episode where she was reported to zone out and was non-verbal. She did not recall the episode. She recalls having her finger pricked, she denies feeling dizzy or any other warning symptoms. Per notes it lasted 45 seconds, but her daughter reports she was still not back to baseline when she arrived in the hospital. She could not say what year it was or who the president is. Her daughter had reported that over the past 2 years, she has had around 5 episodes of confusion and inability to recall events. She saw a neurologist in [place], per daughter workup showed electrolyte abnormalities and prior strokes. These usually occur upon awakening, it takes her 20-30 minutes to get her bearings. Her daughter has not  witnessed them, but her son had reported that she was not quite herself. She drank her electrolyte drink and within 20 minutes was back to normal. At [facility], she had an MRI brain without contrast which I personally reviewed, no acute changes, there was moderate chronic microvascular disease and volume loss. Her wake and sleep EEG was within normal limits. It was felt that she had some underlying dementia and Aricept was recommended,  she has not started this.   She moved to [facility] in October 2018 and has been living in a retirement community at [facility]. Her daughter reported progressive memory problems over the past 5-7 years, worse in the past couple of years. She has gotten lost driving a couple of times and has stopped driving since moving to [place]. She feels her memory is not good, she is forgetting quite a bit. Her daughter took over her finances in November 2018. She manages her own medications, although her daughter reports that one time she checked and a couple of things were off with her medication, she missed an entire day of her pillbox. Her daughter comes twice a week to visit.    She has occasional vertigo from inner ear issues. She has occasional trouble swallowing, neck and back pain, urinary incontinence, constipation. She has occasional tremor on her right hand when painting. She denies any headaches, dizziness, diplopia, dysarthria, focal numbness/tingling/weakness, anosmia. She still feels that her thinking is not straight. She is still forgetting things and repeating conversations. Se denies any olfactory/gustatory hallucinations, deja vu, rising epigastric sensation, focal numbness/tingling/weakness, myoclonic jerks. She had a normal birth and early development.  There is no history of febrile convulsions, CNS infections such as meningitis/encephalitis, significant traumatic brain injury, neurosurgical procedures, or family history of seizures.  Diagnostic Data: Neuropsychological evaluation in 04/2018 showed mild dementia, most likely due to Alzheimer's disease, but cannot rule out mixed dementia (Alzheimer's and vascular dementia). MRI brain without contrast done 12/2017 no acute changes, there was moderate diffuse volume loss and moderate chronic microvascular disease.  PAST MEDICAL HISTORY: Past Medical History:  Diagnosis Date   Alzheimer's dementia ([place]) 2017   Arthritis    OA   Body mass index (BMI) of  22.0-22.9 in adult    Cervical cancer (HCC)    had hysterctomy for   H/O osteoporosis    H/O: CVA (cerebrovascular accident) 2012   , HEMORRHAGIC 09-04-2019 ALSO   H/O: osteoarthritis    Hammertoe of left foot    History of urinary frequency    Hx of diverticulitis of colon    Hypertension    Lumbar stress fracture lL3, 01-2020    MEDICATIONS: Current Outpatient Medications on File Prior to Visit  Medication Sig Dispense Refill   ezetimibe-simvastatin (VYTORIN) 10-40 MG tablet Take 1 tablet by mouth daily. 90 tablet 3   furosemide (LASIX) 20 MG tablet Take 1 tablet (20 mg total) by mouth daily. 90 tablet 3   lisinopril (ZESTRIL) 10 MG tablet TAKE 1 TABLET DAILY (NEED AN APPOINTMENT FOR FURTHER REFILLS) 90 tablet 3   memantine (NAMENDA) 10 MG tablet Take 1 tablet (10 mg total) by mouth 2 (two) times daily. 180 tablet 3   solifenacin (VESICARE) 5 MG tablet Take 1 tablet (5 mg total) by mouth daily. 90 tablet 3   traMADol (ULTRAM) 50 MG tablet Take 2 tablets (100 mg total) by mouth every 6 (six) hours as needed. 60 tablet 5   warfarin (COUMADIN) 7.5 MG tablet TAKE ONE-HALF (1/2) TABLET TO 1  TABLET DAILY AS DIRECTED BY THE COUMADIN CLINIC 80 tablet 3   escitalopram (LEXAPRO) 5 MG tablet TAKE 1 TABLET DAILY (Patient not taking: Reported on 12/19/2021) 90 tablet 3   No current facility-administered medications on file prior to visit.    ALLERGIES: Allergies  Allergen Reactions   Penicillins Anaphylaxis and Swelling    Has patient had a PCN reaction causing immediate rash, facial/tongue/throat swelling, SOB or lightheadedness with hypotension: Yes Has patient had a PCN reaction causing severe rash involving mucus membranes or skin necrosis: No Has patient had a PCN reaction that required hospitalization: No Has patient had a PCN reaction occurring within the last 10 years: No If all of the above answers are "NO", then may proceed with Cephalosporin use.    FAMILY HISTORY: Family  History  Problem Relation Age of Onset   Kidney disease Mother        MALIGNANT NEOPLASM   Arthritis Father    Heart Problems Father    Lung disease Brother    Heart disease Neg Hx     SOCIAL HISTORY: Social History   Socioeconomic History   Marital status: Widowed    Spouse name: Not on file   Number of children: 2   Years of education: 14   Highest education level: Not on file  Occupational History   Not on file  Tobacco Use   Smoking status: Never   Smokeless tobacco: Never  Vaping Use   Vaping Use: Never used  Substance and Sexual Activity   Alcohol use: Yes    Comment: SOCIALLY   Drug use: No   Sexual activity: Not on file    Comment: WIDOW  Other Topics Concern   Not on file  Social History Narrative   Lives alone in a retirement home.  Has 2 children.  Retired Haematologist.  Education: Probation officer school.    Right handed   Social Determinants of Health   Financial Resource Strain: Low Risk    Difficulty of Paying Living Expenses: Not hard at all  Food Insecurity: No Food Insecurity   Worried About Charity fundraiser in the Last Year: Never true   Ran Out of Food in the Last Year: Never true  Transportation Needs: No Transportation Needs   Lack of Transportation (Medical): No   Lack of Transportation (Non-Medical): No  Physical Activity: Inactive   Days of Exercise per Week: 0 days   Minutes of Exercise per Session: 0 min  Stress: No Stress Concern Present   Feeling of Stress : Not at all  Social Connections: Socially Isolated   Frequency of Communication with Friends and Family: Three times a week   Frequency of Social Gatherings with Friends and Family: More than three times a week   Attends Religious Services: Never   Marine scientist or Organizations: No   Attends Archivist Meetings: Never   Marital Status: Widowed  Human resources officer Violence: Not At Risk   Fear of Current or Ex-Partner: No   Emotionally Abused: No   Physically  Abused: No   Sexually Abused: No     PHYSICAL EXAM: Vitals:   12/19/21 1514  BP: (!) 150/61  Pulse: 72  Resp: 18  SpO2: 97%   General: No acute distress Head:  Normocephalic/atraumatic Skin/Extremities: No rash, no edema Neurological Exam: alert and oriented to person, knows she is in a doctor's office. Does not know date/city/state. No aphasia or dysarthria. Fund of knowledge is reduced.  Recent and  remote memory are impaired, 0/3 delayed recall. Attention and concentration are reduced.   Cranial nerves: Pupils equal, round. Extraocular movements intact with no nystagmus. Visual fields full.  No facial asymmetry.  Motor: Bulk and tone normal, muscle strength 5/5 throughout with no pronator drift.   Finger to nose testing intact.  Gait slow and cautious with walker.    IMPRESSION: This is a 88 yo RH woman with a history of aortic valve replacement on anticoagulation with Coumadin, hypertension, cervical cancer, left thalamic hemorrhage, with moderate dementia, likely mixed dementia (Alzheimer's and vascular dementia). There is continued progression, she has wandered and has had times she does not recognize her independent living apartment and refused to go home. She forgets to eat/hydrate, bathe/change clothes. We discussed safety precautions and need for increased supervision/higher level of care. Her daughter would like her to stay in [place] with daily aides coming for now. Continue Memantine 10mg  BID. May consider a different SSRI if needed (ie Sertraline), continue to monitor mood. Follow-up with Memory Disorder PA Sharene Butters in 6 months, call for any changes.     Thank you for allowing me to participate in her care.  Please do not hesitate to call for any questions or concerns.   Ellouise Newer, M.D.   CC: Dr. Sarajane Jews

## 2021-12-23 ENCOUNTER — Other Ambulatory Visit: Payer: Self-pay | Admitting: Family Medicine

## 2021-12-25 NOTE — Telephone Encounter (Signed)
Patient's insurance no longer pays for Solifenacin 5 mg.   Please advise if okay to send Ditropan XL 5mg  in place.

## 2021-12-28 ENCOUNTER — Ambulatory Visit (INDEPENDENT_AMBULATORY_CARE_PROVIDER_SITE_OTHER): Payer: Medicare HMO

## 2021-12-28 VITALS — Ht 67.0 in | Wt 127.0 lb

## 2021-12-28 DIAGNOSIS — Z0000 Encounter for general adult medical examination without abnormal findings: Secondary | ICD-10-CM

## 2021-12-28 NOTE — Progress Notes (Addendum)
Subjective:   Virginia Franklin is a 88 y.o. female who presents for Medicare Annual (Subsequent) preventive examination.  Review of Systems    Virtual Visit via Telephone Note  I connected with  Virginia Franklin on 12/28/21 at 10:30 AM EST by telephone and verified that I am speaking with the correct person using two identifiers.  Location: Patient: Home Provider: Office Persons participating in the virtual visit: patient/Nurse Health Advisor   I discussed the limitations, risks, security and privacy concerns of performing an evaluation and management service by telephone and the availability of in person appointments. The patient expressed understanding and agreed to proceed.  Interactive audio and video telecommunications were attempted between this nurse and patient, however failed, due to patient having technical difficulties OR patient did not have access to video capability.  We continued and completed visit with audio only.  Some vital signs may be absent or patient reported.   Virginia Peaches, LPN  Cardiac Risk Factors include: advanced age (>83men, >24 women);hypertension     Objective:    Today's Vitals   12/28/21 1017  Weight: 127 lb (57.6 kg)  Height: 5\' 7"  (1.702 m)   Body mass index is 19.89 kg/m.  Advanced Directives 12/28/2021 12/19/2021 05/31/2021 01/18/2021 04/06/2020 01/17/2020 01/17/2020  Does Patient Have a Medical Advance Directive? Yes Yes Yes Yes Yes No No  Type of Paramedic of [place];Living will - Living will;Out of facility DNR (pink MOST or yellow form) Out of facility DNR (pink MOST or yellow form);Healthcare Power of Attorney Living will;Out of facility DNR (pink MOST or yellow form);Healthcare Power of Attorney - -  Does patient want to make changes to medical advance directive? No - Patient declined - - No - Patient declined - - -  Copy of [place] in Chart? No - copy requested - - No - copy  requested - - -  Would patient like information on creating a medical advance directive? - - - - - No - Patient declined No - Patient declined    Current Medications (verified) Outpatient Encounter Medications as of 12/28/2021  Medication Sig   escitalopram (LEXAPRO) 5 MG tablet TAKE 1 TABLET DAILY (Patient not taking: Reported on 12/19/2021)   ezetimibe-simvastatin (VYTORIN) 10-40 MG tablet Take 1 tablet by mouth daily.   furosemide (LASIX) 20 MG tablet Take 1 tablet (20 mg total) by mouth daily.   lisinopril (ZESTRIL) 10 MG tablet TAKE 1 TABLET DAILY (NEED AN APPOINTMENT FOR FURTHER REFILLS)   memantine (NAMENDA) 10 MG tablet Take 1 tablet (10 mg total) by mouth 2 (two) times daily.   oxybutynin (DITROPAN-XL) 5 MG 24 hr tablet Take 1 tablet (5 mg total) by mouth at bedtime.   traMADol (ULTRAM) 50 MG tablet Take 2 tablets (100 mg total) by mouth every 6 (six) hours as needed.   warfarin (COUMADIN) 7.5 MG tablet TAKE ONE-HALF (1/2) TABLET TO 1 TABLET DAILY AS DIRECTED BY THE COUMADIN CLINIC   No facility-administered encounter medications on file as of 12/28/2021.    Allergies (verified) Penicillins   History: Past Medical History:  Diagnosis Date   Alzheimer's dementia ([place]) 2017   Arthritis    OA   Body mass index (BMI) of 22.0-22.9 in adult    Cervical cancer [facility])    had hysterctomy for   H/O osteoporosis    H/O: CVA (cerebrovascular accident) 2012   , HEMORRHAGIC 09-04-2019 ALSO   H/O: osteoarthritis    Hammertoe of  left foot    History of urinary frequency    Hx of diverticulitis of colon    Hypertension    Lumbar stress fracture lL3, 01-2020   Past Surgical History:  Procedure Laterality Date   ABDOMINAL HYSTERECTOMY     CATARACT EXTRACTION Bilateral    HAMMER TOE SURGERY Left 07/29/2020   Procedure: HAMMER TOE REPAIR 2ND TOE LEFT;  Surgeon: Virginia Franklin, DPM;  Location: [facility];  Service: Podiatry;  Laterality: Left;   INCISION AND DRAINAGE  Bilateral 07/29/2020   Procedure: EXCISION OF WOUND OF LEFT SECOND TOE; RELEASE OF TENDONS LEFT THIRD TOE, RIGHT THIRD TOE;  Surgeon: Virginia Franklin, DPM;  Location: [place];  Service: Podiatry;  Laterality: Bilateral;   mechanical heart valve  1990   st jude done in [place]     Family History  Problem Relation Age of Onset   Kidney disease Mother        MALIGNANT NEOPLASM   Arthritis Father    Heart Problems Father    Lung disease Brother    Heart disease Neg Hx    Social History   Socioeconomic History   Marital status: Widowed    Spouse name: Not on file   Number of children: 2   Years of education: 14   Highest education level: Not on file  Occupational History   Not on file  Tobacco Use   Smoking status: Never   Smokeless tobacco: Never  Vaping Use   Vaping Use: Never used  Substance and Sexual Activity   Alcohol use: Yes    Comment: SOCIALLY   Drug use: No   Sexual activity: Not on file    Comment: WIDOW  Other Topics Concern   Not on file  Social History Narrative   Lives alone in a retirement home.  Has 2 children.  Retired Haematologist.  Education: Probation officer school.    Right handed   Social Determinants of Health   Financial Resource Strain: Low Risk    Difficulty of Paying Living Expenses: Not very hard  Food Insecurity: No Food Insecurity   Worried About Running Out of Food in the Last Year: Never true   Ran Out of Food in the Last Year: Never true  Transportation Needs: No Transportation Needs   Lack of Transportation (Medical): No   Lack of Transportation (Non-Medical): No  Physical Activity: Inactive   Days of Exercise per Week: 0 days   Minutes of Exercise per Session: 0 min  Stress: No Stress Concern Present   Feeling of Stress : Not at all  Social Connections: Socially Isolated   Frequency of Communication with Friends and Family: More than three times a week   Frequency of Social Gatherings  with Friends and Family: More than three times a week   Attends Religious Services: Never   Marine scientist or Organizations: No   Attends Archivist Meetings: Never   Marital Status: Widowed    Clinical Intake: How often do you need to have someone help you when you read instructions, pamphlets, or other written materials from your doctor or pharmacy?: 1 - Never  Diabetic? No  Interpreter Needed?: No Activities of Daily Living In your present state of health, do you have any difficulty performing the following activities: 12/28/2021 01/18/2021  Hearing? N Y  Vision? N N  Difficulty concentrating or making decisions? N Y  Walking or climbing stairs? Aggie Moats  Dressing or bathing? N N  Doing errands, shopping? N Y  Conservation officer, nature and eating ? N N  Using the Toilet? N N  In the past six months, have you accidently leaked urine? Y Y  Comment Wears pads -  Do you have problems with loss of bowel control? N N  Managing your Medications? N Y  Managing your Finances? N Y  Housekeeping or managing your Housekeeping? N N  Some recent data might be hidden    Patient Care Team: [name], MD as PCP - General (Family Medicine) Belva Crome, MD as PCP - Cardiology (Cardiology) Cameron Sprang, MD as Consulting Physician (Neurology) Elease Hashimoto (Neurology)  Indicate any recent Medical Services you may have received from other than [facility] providers in the past year (date may be approximate).     Assessment:   This is a routine wellness examination for [place].  Hearing/Vision screen Hearing Screening - Comments:: No difficulty hearing Vision Screening - Comments:: No vision difficulty  Dietary issues and exercise activities discussed: Exercise limited by: None identified   Goals Addressed               This Visit's Progress     Patient Stated (pt-stated)        I would like to stay active and continuing living.        Depression Screen PHQ  2/9 Scores 12/28/2021 12/06/2021 01/18/2021 04/06/2020 10/13/2019 01/13/2018 10/15/2017  PHQ - 2 Score 0 0 0 0 0 0 0  PHQ- 9 Score 0 10 - - 5 - -    Fall Risk Fall Risk  12/28/2021 12/19/2021 12/06/2021 05/31/2021 01/18/2021  Falls in the past year? 0 0 1 1 -  Number falls in past yr: 0 0 0 1 0  Injury with Fall? 0 0 0 1 0  Risk Factor Category  - - - - -  Risk for fall due to : No Fall Risks - - - History of fall(s)  Follow up - - Falls evaluation completed - Falls evaluation completed;Falls prevention discussed    FALL RISK PREVENTION PERTAINING TO THE HOME:  Any stairs in or around the home? Yes  If so, are there any without handrails? No  Home free of loose throw rugs in walkways, pet beds, electrical cords, etc? Yes  Adequate lighting in your home to reduce risk of falls? Yes   ASSISTIVE DEVICES UTILIZED TO PREVENT FALLS:  Life alert? No Use of a cane, walker or w/c? Yes  Grab bars in the bathroom? Yes  Shower chair or bench in shower? Yes  Elevated toilet seat or a handicapped toilet? Yes   TIMED UP AND GO:  Was the test performed? No . Audio Visit  Cognitive Function: MMSE - Mini Mental State Exam 04/06/2020  Orientation to time 1  Orientation to Place 1  Registration 3  Attention/ Calculation 5  Recall 0  Language- name 2 objects 2  Language- repeat 1  Language- follow 3 step command 3  Language- read & follow direction 1  Write a sentence 1  Copy design 1  Total score 19   Montreal Cognitive Assessment  01/28/2018  Visuospatial/ Executive (0/5) 5  Naming (0/3) 3  Attention: Read list of digits (0/2) 1  Attention: Read list of letters (0/1) 1  Attention: Serial 7 subtraction starting at 100 (0/3) 3  Language: Repeat phrase (0/2) 0  Language : Fluency (0/1) 0  Abstraction (0/2) 1  Delayed Recall (  0/5) 2  Orientation (0/6) 4  Total 20   6CIT Screen 12/28/2021  What Year? 4 points  What month? 0 points  What time? 3 points  Count back from 20 2 points  Months in  reverse 4 points  Repeat phrase 4 points  Total Score 17    Immunizations Immunization History  Administered Date(s) Administered   Fluad Quad(high Dose 65+) 07/31/2019   Influenza, High Dose Seasonal PF 07/25/2018   Influenza-Unspecified 09/06/2017, 07/27/2020, 11/10/2021   Moderna Sars-Covid-2 Vaccination 11/30/2019, 12/28/2019   Tdap 04/28/2018    TDAP status: Up to date  Flu Vaccine status: Up to date  Pneumococcal vaccine status: Due, Education has been provided regarding the importance of this vaccine. Advised may receive this vaccine at local pharmacy or Health Dept. Aware to provide a copy of the vaccination record if obtained from local pharmacy or Health Dept. Verbalized acceptance and understanding.  Covid-19 vaccine status: Information provided on how to obtain vaccines.   Qualifies for Shingles Vaccine? Yes   Zostavax completed No   Shingrix Completed?: No.    Education has been provided regarding the importance of this vaccine. Patient has been advised to call insurance company to determine out of pocket expense if they have not yet received this vaccine. Advised may also receive vaccine at local pharmacy or Health Dept. Verbalized acceptance and understanding.  Screening Tests Health Maintenance  Topic Date Due   COVID-19 Vaccine (3 - Booster for Moderna series) 01/13/2022 (Originally 02/22/2020)   Zoster Vaccines- Shingrix (1 of 2) 03/27/2022 (Originally [birth year]/15/1981)   Pneumonia Vaccine 44+ Years old (1 - PCV) 12/28/2022 (Originally 05/15/1996)   TETANUS/TDAP  04/28/2028   INFLUENZA VACCINE  Completed   HPV VACCINES  Aged Out   DEXA SCAN  Discontinued    Health Maintenance  There are no preventive care reminders to display for this patient.   Colorectal cancer screening: No longer required.   Mammogram status: No longer required due to Age.    Lung Cancer Screening: (Low Dose CT Chest recommended if Age 68-80 years, 30 pack-year currently smoking OR have  quit w/in 15years.) does not qualify.    Additional Screening:  Hepatitis C Screening: does not qualify; Completed   Vision Screening: Recommended annual ophthalmology exams for early detection of glaucoma and other disorders of the eye. Is the patient up to date with their annual eye exam?  No Who is the provider or what is the name of the office in which the patient attends annual eye exams? Patient deferred If pt is not established with a provider, would they like to be referred to a provider to establish care? No .   Dental Screening: Recommended annual dental exams for proper oral hygiene  Community Resource Referral / Chronic Care Management:  CRR required this visit?  No   CCM required this visit?  No      Plan:     I have personally reviewed and noted the following in the patients chart:   Medical and social history Use of alcohol, tobacco or illicit drugs  Current medications and supplements including opioid prescriptions. Patient currently taking opioids Functional ability and status Nutritional status Physical activity Advanced directives List of other physicians Hospitalizations, surgeries, and ER visits in previous 12 months Vitals Screenings to include cognitive, depression, and falls Referrals and appointments  In addition, I have reviewed and discussed with patient certain preventive protocols, quality metrics, and best practice recommendations. A written personalized care plan for preventive services as  well as general preventive health recommendations were provided to patient.     Virginia Peaches, LPN   12/30/2881   Nurse Notes: None

## 2021-12-28 NOTE — Patient Instructions (Addendum)
Virginia Franklin , Thank you for taking time to come for your Medicare Wellness Visit. I appreciate your ongoing commitment to your health goals. Please review the following plan we discussed and let me know if I can assist you in the future.   These are the goals we discussed:  Goals       Patient Stated (pt-stated)      I would like to stay active and continuing living.         This is a list of the screening recommended for you and due dates:  Health Maintenance  Topic Date Due   COVID-19 Vaccine (3 - Booster for Moderna series) 01/13/2022*   Zoster (Shingles) Vaccine (1 of 2) 03/27/2022*   Pneumonia Vaccine (1 - PCV) 12/28/2022*   Tetanus Vaccine  04/28/2028   Flu Shot  Completed   HPV Vaccine  Aged Out   DEXA scan (bone density measurement)  Discontinued  *Topic was postponed. The date shown is not the original due date.   Opioid Pain Medicine Management Opioids are powerful medicines that are used to treat moderate to severe pain. When used for short periods of time, they can help you to: Sleep better. Do better in physical or occupational therapy. Feel better in the first few days after an injury. Recover from surgery. Opioids should be taken with the supervision of a trained health care provider. They should be taken for the shortest period of time possible. This is because opioids can be addictive, and the longer you take opioids, the greater your risk of addiction. This addiction can also be called opioid use disorder. What are the risks? Using opioid pain medicines for longer than 3 days increases your risk of side effects. Side effects include: Constipation. Nausea and vomiting. Breathing difficulties (respiratory depression). Drowsiness. Confusion. Opioid use disorder. Itching. Taking opioid pain medicine for a long period of time can affect your ability to do daily tasks. It also puts you at risk for: Motor vehicle crashes. Depression. Suicide. Heart  attack. Overdose, which can be life-threatening. What is a pain treatment plan? A pain treatment plan is an agreement between you and your health care provider. Pain is unique to each person, and treatments vary depending on your condition. To manage your pain, you and your health care provider need to work together. To help you do this: Discuss the goals of your treatment, including how much pain you might expect to have and how you will manage the pain. Review the risks and benefits of taking opioid medicines. Remember that a good treatment plan uses more than one approach and minimizes the chance of side effects. Be honest about the amount of medicines you take and about any drug or alcohol use. Get pain medicine prescriptions from only one health care provider. Pain can be managed with many types of alternative treatments. Ask your health care provider to refer you to one or more specialists who can help you manage pain through: Physical or occupational therapy. Counseling (cognitive behavioral therapy). Good nutrition. Biofeedback. Massage. Meditation. Non-opioid medicine. Following a gentle exercise program. How to use opioid pain medicine Taking medicine Take your pain medicine exactly as told by your health care provider. Take it only when you need it. If your pain gets less severe, you may take less than your prescribed dose if your health care provider approves. If you are not having pain, do nottake pain medicine unless your health care provider tells you to take it. If your pain is  severe, do nottry to treat it yourself by taking more pills than instructed on your prescription. Contact your health care provider for help. Write down the times when you take your pain medicine. It is easy to become confused while on pain medicine. Writing the time can help you avoid overdose. Take other over-the-counter or prescription medicines only as told by your health care provider. Keeping  yourself and others safe  While you are taking opioid pain medicine: Do not drive, use machinery, or power tools. Do not sign legal documents. Do not drink alcohol. Do not take sleeping pills. Do not supervise children by yourself. Do not do activities that require climbing or being in high places. Do not go to a lake, river, ocean, spa, or swimming pool. Do not share your pain medicine with anyone. Keep pain medicine in a locked cabinet or in a secure area where pets and children cannot reach it. Stopping your use of opioids If you have been taking opioid medicine for more than a few weeks, you may need to slowly decrease (taper) how much you take until you stop completely. Tapering your use of opioids can decrease your risk of symptoms of withdrawal, such as: Pain and cramping in the abdomen. Nausea. Sweating. Sleepiness. Restlessness. Uncontrollable shaking (tremors). Cravings for the medicine. Do not attempt to taper your use of opioids on your own. Talk with your health care provider about how to do this. Your health care provider may prescribe a step-down schedule based on how much medicine you are taking and how long you have been taking it. Getting rid of leftover pills Do not save any leftover pills. Get rid of leftover pills safely by: Taking the medicine to a prescription take-back program. This is usually offered by the county or law enforcement. Bringing them to a pharmacy that has a drug disposal container. Flushing them down the toilet. Check the label or package insert of your medicine to see whether this is safe to do. Throwing them out in the trash. Check the label or package insert of your medicine to see whether this is safe to do. If it is safe to throw it out, remove the medicine from the original container, put it into a sealable bag or container, and mix it with used coffee grounds, food scraps, dirt, or cat litter before putting it in the trash. Follow these  instructions at home: Activity Do exercises as told by your health care provider. Avoid activities that make your pain worse. Return to your normal activities as told by your health care provider. Ask your health care provider what activities are safe for you. General instructions You may need to take these actions to prevent or treat constipation: Drink enough fluid to keep your urine pale yellow. Take over-the-counter or prescription medicines. Eat foods that are high in fiber, such as beans, whole grains, and fresh fruits and vegetables. Limit foods that are high in fat and processed sugars, such as fried or sweet foods. Keep all follow-up visits. This is important. Where to find support If you have been taking opioids for a long time, you may benefit from receiving support for quitting from a local support group or counselor. Ask your health care provider for a referral to these resources in your area. Where to find more information Centers for Disease Control and Prevention (CDC): http://www.wolf.info/ [place] Food and Drug Administration (FDA): [place] Get help right away if: You may have taken too much of an opioid (overdosed). Common symptoms of  an overdose: Your breathing is slower or more shallow than normal. You have a very slow heartbeat (pulse). You have slurred speech. You have nausea and vomiting. Your pupils become very small. You have other potential symptoms: You are very confused. You faint or feel like you will faint. You have cold, clammy skin. You have blue lips or fingernails. You have thoughts of harming yourself or harming others. These symptoms may represent a serious problem that is an emergency. Do not wait to see if the symptoms will go away. Get medical help right away. Call your local emergency services (911 in the [place] Do not drive yourself to the hospital.  If you ever feel like you may hurt yourself or others, or have thoughts about taking your own life, get  help right away. Go to your nearest emergency department or: Call your local emergency services (911 in the [place] Call the [facility] 226-275-1533 in the [place] Call a suicide crisis helpline, such as the [facility] at 475 061 1819 or 988 in the [place]. This is open 24 hours a day in the [place] Text the Crisis Text Line at 850-371-3722 (in the [place] Summary Opioid medicines can help you manage moderate to severe pain for a short period of time. A pain treatment plan is an agreement between you and your health care provider. Discuss the goals of your treatment, including how much pain you might expect to have and how you will manage the pain. If you think that you or someone else may have taken too much of an opioid, get medical help right away. This information is not intended to replace advice given to you by your health care provider. Make sure you discuss any questions you have with your health care provider. Document Revised: 05/24/2021 Document Reviewed: 02/08/2021 Elsevier Patient Education  [place].  Advanced directives: Yes Patient will bring copy  Conditions/risks identified: None  Next appointment: Follow up in one year for your annual wellness visit    Preventive Care 65 Years and Older, Female Preventive care refers to lifestyle choices and visits with your health care provider that can promote health and wellness. What does preventive care include? A yearly physical exam. This is also called an annual well check. Dental exams once or twice a year. Routine eye exams. Ask your health care provider how often you should have your eyes checked. Personal lifestyle choices, including: Daily care of your teeth and gums. Regular physical activity. Eating a healthy diet. Avoiding tobacco and drug use. Limiting alcohol use. Practicing safe sex. Taking low-dose aspirin every day. Taking vitamin and mineral supplements as  recommended by your health care provider. What happens during an annual well check? The services and screenings done by your health care provider during your annual well check will depend on your age, overall health, lifestyle risk factors, and family history of disease. Counseling  Your health care provider may ask you questions about your: Alcohol use. Tobacco use. Drug use. Emotional well-being. Home and relationship well-being. Sexual activity. Eating habits. History of falls. Memory and ability to understand (cognition). Work and work Statistician. Reproductive health. Screening  You may have the following tests or measurements: Height, weight, and BMI. Blood pressure. Lipid and cholesterol levels. These may be checked every 5 years, or more frequently if you are over 71 years old. Skin check. Lung cancer screening. You may have this screening every year starting at age 29 if you have a 30-pack-year history of smoking  and currently smoke or have quit within the past 15 years. Fecal occult blood test (FOBT) of the stool. You may have this test every year starting at age 50. Flexible sigmoidoscopy or colonoscopy. You may have a sigmoidoscopy every 5 years or a colonoscopy every 10 years starting at age 79. Hepatitis C blood test. Hepatitis B blood test. Sexually transmitted disease (STD) testing. Diabetes screening. This is done by checking your blood sugar (glucose) after you have not eaten for a while (fasting). You may have this done every 1-3 years. Bone density scan. This is done to screen for osteoporosis. You may have this done starting at age 23. Mammogram. This may be done every 1-2 years. Talk to your health care provider about how often you should have regular mammograms. Talk with your health care provider about your test results, treatment options, and if necessary, the need for more tests. Vaccines  Your health care provider may recommend certain vaccines, such  as: Influenza vaccine. This is recommended every year. Tetanus, diphtheria, and acellular pertussis (Tdap, Td) vaccine. You may need a Td booster every 10 years. Zoster vaccine. You may need this after age 71. Pneumococcal 13-valent conjugate (PCV13) vaccine. One dose is recommended after age 11. Pneumococcal polysaccharide (PPSV23) vaccine. One dose is recommended after age 39. Talk to your health care provider about which screenings and vaccines you need and how often you need them. This information is not intended to replace advice given to you by your health care provider. Make sure you discuss any questions you have with your health care provider. Document Released: 11/25/2015 Document Revised: 07/18/2016 Document Reviewed: 08/30/2015 Elsevier Interactive Patient Education  2017 [place] Prevention in the Home Falls can cause injuries. They can happen to people of all ages. There are many things you can do to make your home safe and to help prevent falls. What can I do on the outside of my home? Regularly fix the edges of walkways and driveways and fix any cracks. Remove anything that might make you trip as you walk through a door, such as a raised step or threshold. Trim any bushes or trees on the path to your home. Use bright outdoor lighting. Clear any walking paths of anything that might make someone trip, such as rocks or tools. Regularly check to see if handrails are loose or broken. Make sure that both sides of any steps have handrails. Any raised decks and porches should have guardrails on the edges. Have any leaves, snow, or ice cleared regularly. Use sand or salt on walking paths during winter. Clean up any spills in your garage right away. This includes oil or grease spills. What can I do in the bathroom? Use night lights. Install grab bars by the toilet and in the tub and shower. Do not use towel bars as grab bars. Use non-skid mats or decals in the tub or  shower. If you need to sit down in the shower, use a plastic, non-slip stool. Keep the floor dry. Clean up any water that spills on the floor as soon as it happens. Remove soap buildup in the tub or shower regularly. Attach bath mats securely with double-sided non-slip rug tape. Do not have throw rugs and other things on the floor that can make you trip. What can I do in the bedroom? Use night lights. Make sure that you have a light by your bed that is easy to reach. Do not use any sheets or blankets that are  too big for your bed. They should not hang down onto the floor. Have a firm chair that has side arms. You can use this for support while you get dressed. Do not have throw rugs and other things on the floor that can make you trip. What can I do in the kitchen? Clean up any spills right away. Avoid walking on wet floors. Keep items that you use a lot in easy-to-reach places. If you need to reach something above you, use a strong step stool that has a grab bar. Keep electrical cords out of the way. Do not use floor polish or wax that makes floors slippery. If you must use wax, use non-skid floor wax. Do not have throw rugs and other things on the floor that can make you trip. What can I do with my stairs? Do not leave any items on the stairs. Make sure that there are handrails on both sides of the stairs and use them. Fix handrails that are broken or loose. Make sure that handrails are as long as the stairways. Check any carpeting to make sure that it is firmly attached to the stairs. Fix any carpet that is loose or worn. Avoid having throw rugs at the top or bottom of the stairs. If you do have throw rugs, attach them to the floor with carpet tape. Make sure that you have a light switch at the top of the stairs and the bottom of the stairs. If you do not have them, ask someone to add them for you. What else can I do to help prevent falls? Wear shoes that: Do not have high heels. Have  rubber bottoms. Are comfortable and fit you well. Are closed at the toe. Do not wear sandals. If you use a stepladder: Make sure that it is fully opened. Do not climb a closed stepladder. Make sure that both sides of the stepladder are locked into place. Ask someone to hold it for you, if possible. Clearly mark and make sure that you can see: Any grab bars or handrails. First and last steps. Where the edge of each step is. Use tools that help you move around (mobility aids) if they are needed. These include: Canes. Walkers. Scooters. Crutches. Turn on the lights when you go into a dark area. Replace any light bulbs as soon as they burn out. Set up your furniture so you have a clear path. Avoid moving your furniture around. If any of your floors are uneven, fix them. If there are any pets around you, be aware of where they are. Review your medicines with your doctor. Some medicines can make you feel dizzy. This can increase your chance of falling. Ask your doctor what other things that you can do to help prevent falls. This information is not intended to replace advice given to you by your health care provider. Make sure you discuss any questions you have with your health care provider. Document Released: 08/25/2009 Document Revised: 04/05/2016 Document Reviewed: 12/03/2014 Elsevier Interactive Patient Education  2017 [employer].

## 2022-01-03 ENCOUNTER — Encounter: Payer: Self-pay | Admitting: Family Medicine

## 2022-01-10 ENCOUNTER — Other Ambulatory Visit: Payer: Self-pay

## 2022-01-10 ENCOUNTER — Ambulatory Visit (INDEPENDENT_AMBULATORY_CARE_PROVIDER_SITE_OTHER): Payer: Medicare HMO

## 2022-01-10 DIAGNOSIS — Z952 Presence of prosthetic heart valve: Secondary | ICD-10-CM | POA: Diagnosis not present

## 2022-01-10 DIAGNOSIS — Z5181 Encounter for therapeutic drug level monitoring: Secondary | ICD-10-CM

## 2022-01-10 DIAGNOSIS — Z7901 Long term (current) use of anticoagulants: Secondary | ICD-10-CM

## 2022-01-10 LAB — POCT INR: INR: 4 — AB (ref 2.0–3.0)

## 2022-01-10 NOTE — Patient Instructions (Signed)
Description   ?Hold tomorrow's dose and only 0.5 tablet Friday and then continue taking 1/2 tablet daily except for 1 tablet on Mondays, Wednesdays, and Fridays. Recheck INR in 2 weeks. [facility] [phone]. Please call [name] with dosing instructions [phone] ?  ?   ?

## 2022-01-18 ENCOUNTER — Ambulatory Visit: Payer: Medicare HMO | Admitting: Podiatry

## 2022-01-24 ENCOUNTER — Other Ambulatory Visit: Payer: Self-pay

## 2022-01-24 ENCOUNTER — Ambulatory Visit (INDEPENDENT_AMBULATORY_CARE_PROVIDER_SITE_OTHER): Payer: Medicare HMO

## 2022-01-24 DIAGNOSIS — Z952 Presence of prosthetic heart valve: Secondary | ICD-10-CM | POA: Diagnosis not present

## 2022-01-24 DIAGNOSIS — Z5181 Encounter for therapeutic drug level monitoring: Secondary | ICD-10-CM | POA: Diagnosis not present

## 2022-01-24 DIAGNOSIS — Z7901 Long term (current) use of anticoagulants: Secondary | ICD-10-CM

## 2022-01-24 LAB — POCT INR: INR: 5.2 — AB (ref 2.0–3.0)

## 2022-01-24 NOTE — Patient Instructions (Addendum)
Description   ?Since you have already taken today's dose of warfarin, do not take any warfarin on Thursday and no warfarin on Friday then START taking 1/2 tablet daily except for 1 tablet on Mondays. Recheck INR in 1 week. [name] with instructions.  ?[facility] [phone].  ?Please call [name] with dosing instructions [phone] ?  ?  ? ?

## 2022-01-31 ENCOUNTER — Other Ambulatory Visit: Payer: Self-pay

## 2022-01-31 ENCOUNTER — Ambulatory Visit (INDEPENDENT_AMBULATORY_CARE_PROVIDER_SITE_OTHER): Payer: Medicare HMO

## 2022-01-31 DIAGNOSIS — Z5181 Encounter for therapeutic drug level monitoring: Secondary | ICD-10-CM

## 2022-01-31 DIAGNOSIS — Z7901 Long term (current) use of anticoagulants: Secondary | ICD-10-CM

## 2022-01-31 DIAGNOSIS — Z952 Presence of prosthetic heart valve: Secondary | ICD-10-CM | POA: Diagnosis not present

## 2022-01-31 LAB — POCT INR: INR: 3.5 — AB (ref 2.0–3.0)

## 2022-01-31 NOTE — Patient Instructions (Signed)
Description   ?Since you have already taken today's dose of warfarin, do not take any warfarin on Thursday and then continue taking 1/2 tablet daily except for 1 tablet on Mondays. Recheck INR in 2 weeks. [name] with instructions.  ?[facility] [phone].  ?Please call [name] with dosing instructions [phone] ?  ?   ?

## 2022-02-01 ENCOUNTER — Other Ambulatory Visit: Payer: Self-pay | Admitting: Interventional Cardiology

## 2022-02-01 ENCOUNTER — Ambulatory Visit (INDEPENDENT_AMBULATORY_CARE_PROVIDER_SITE_OTHER): Payer: Medicare HMO | Admitting: Podiatry

## 2022-02-01 DIAGNOSIS — Z952 Presence of prosthetic heart valve: Secondary | ICD-10-CM

## 2022-02-01 DIAGNOSIS — Z7901 Long term (current) use of anticoagulants: Secondary | ICD-10-CM | POA: Diagnosis not present

## 2022-02-01 DIAGNOSIS — B351 Tinea unguium: Secondary | ICD-10-CM | POA: Diagnosis not present

## 2022-02-01 DIAGNOSIS — M79674 Pain in right toe(s): Secondary | ICD-10-CM | POA: Diagnosis not present

## 2022-02-01 DIAGNOSIS — M79675 Pain in left toe(s): Secondary | ICD-10-CM | POA: Diagnosis not present

## 2022-02-01 DIAGNOSIS — Z5181 Encounter for therapeutic drug level monitoring: Secondary | ICD-10-CM

## 2022-02-01 DIAGNOSIS — M2041 Other hammer toe(s) (acquired), right foot: Secondary | ICD-10-CM

## 2022-02-01 DIAGNOSIS — L84 Corns and callosities: Secondary | ICD-10-CM

## 2022-02-05 NOTE — Progress Notes (Signed)
?  Subjective:  ?Patient ID: Virginia Franklin [facility], female    DOB: November 13, 1928,  MRN: [id number] ? ?Chief Complaint  ?Patient presents with  ? Hammer Toe  ?  R foot pain 10 wk f/u  ? ? ?88 y.o. female returns for follow-up with the above complaint. History confirmed with patient.  Doing okay still getting the callus on the inside of the second toe and the nails are thickened and elongated again ? ?Objective:  ?Physical Exam: tenderness at the surgical site, local edema noted, calf supple, nontender and reduced HAV deformity. Onychomycosis x10 with incurvated nail borders.  Hyperkeratotic painful lesion medial second toe ? ? ?Radiographs left foot: good surgical correction maintained, some regrowth of bone at arthroplasty site ?Assessment:  ? ?1. Hammertoe of right foot   ?2. Callus of foot   ?3. Pain due to onychomycosis of toenails of both feet   ?4. Chronic anticoagulation   ? ? ? ? ? ?Plan:  ?Patient was evaluated and treated and all questions answered. ? ?Discussed the etiology and treatment options for the condition in detail with the patient. Educated patient on the topical and oral treatment options for mycotic nails. Recommended debridement of the nails today. Sharp and mechanical debridement performed of all painful and mycotic nails today. Nails debrided in length and thickness using a nail nipper to level of comfort. Discussed treatment options including appropriate shoe gear. Follow up as needed for painful nails. ? ? ?All symptomatic hyperkeratoses were safely debrided with a sterile #15 blade to patient's level of comfort without incident. We discussed preventative and palliative care of these lesions including supportive and accommodative shoegear, padding, prefabricated and custom molded accommodative orthoses, use of a pumice stone and lotions/creams daily. ? ? ? ?Return in about 10 years (around 02/02/2032) for at risk foot care.  ?

## 2022-02-09 ENCOUNTER — Other Ambulatory Visit: Payer: Self-pay | Admitting: Family Medicine

## 2022-02-10 ENCOUNTER — Other Ambulatory Visit: Payer: Self-pay | Admitting: Family Medicine

## 2022-02-14 ENCOUNTER — Ambulatory Visit (INDEPENDENT_AMBULATORY_CARE_PROVIDER_SITE_OTHER): Payer: Medicare HMO

## 2022-02-14 DIAGNOSIS — Z5181 Encounter for therapeutic drug level monitoring: Secondary | ICD-10-CM

## 2022-02-14 DIAGNOSIS — Z952 Presence of prosthetic heart valve: Secondary | ICD-10-CM | POA: Diagnosis not present

## 2022-02-14 DIAGNOSIS — Z7901 Long term (current) use of anticoagulants: Secondary | ICD-10-CM | POA: Diagnosis not present

## 2022-02-14 LAB — POCT INR: INR: 2.8 (ref 2.0–3.0)

## 2022-02-14 NOTE — Patient Instructions (Signed)
Description   ?Continue taking 1/2 tablet daily except for 1 tablet on Mondays. Recheck INR in 3 weeks. ?[facility] [phone].  ?Please call [name] with dosing instructions [phone] ?  ?   ?

## 2022-02-23 ENCOUNTER — Other Ambulatory Visit: Payer: Self-pay | Admitting: Family Medicine

## 2022-02-24 ENCOUNTER — Other Ambulatory Visit: Payer: Self-pay | Admitting: Family Medicine

## 2022-03-07 ENCOUNTER — Ambulatory Visit (INDEPENDENT_AMBULATORY_CARE_PROVIDER_SITE_OTHER): Payer: Medicare HMO

## 2022-03-07 DIAGNOSIS — Z7901 Long term (current) use of anticoagulants: Secondary | ICD-10-CM

## 2022-03-07 DIAGNOSIS — Z952 Presence of prosthetic heart valve: Secondary | ICD-10-CM | POA: Diagnosis not present

## 2022-03-07 DIAGNOSIS — Z5181 Encounter for therapeutic drug level monitoring: Secondary | ICD-10-CM

## 2022-03-07 LAB — POCT INR: INR: 2.4 (ref 2.0–3.0)

## 2022-03-07 NOTE — Patient Instructions (Addendum)
Description   ?Take an extra 0.5 tablet today and then continue taking 1/2 tablet daily except for 1 tablet on Mondays.  ?Recheck INR in 4 weeks.  ?[facility] [phone].  ?  ?   ?

## 2022-04-04 ENCOUNTER — Ambulatory Visit (INDEPENDENT_AMBULATORY_CARE_PROVIDER_SITE_OTHER): Payer: Medicare HMO | Admitting: *Deleted

## 2022-04-04 DIAGNOSIS — Z5181 Encounter for therapeutic drug level monitoring: Secondary | ICD-10-CM | POA: Diagnosis not present

## 2022-04-04 DIAGNOSIS — Z952 Presence of prosthetic heart valve: Secondary | ICD-10-CM | POA: Diagnosis not present

## 2022-04-04 DIAGNOSIS — Z7901 Long term (current) use of anticoagulants: Secondary | ICD-10-CM

## 2022-04-04 LAB — POCT INR: INR: 2.6 (ref 2.0–3.0)

## 2022-04-04 NOTE — Patient Instructions (Signed)
Description   Continue taking 1/2 tablet daily except for 1 tablet on Mondays. Recheck INR in 5 weeks. [facility] [phone].

## 2022-04-12 ENCOUNTER — Ambulatory Visit: Payer: Medicare HMO | Admitting: Podiatry

## 2022-04-18 ENCOUNTER — Other Ambulatory Visit: Payer: Self-pay | Admitting: Family Medicine

## 2022-04-18 MED ORDER — LISINOPRIL 10 MG PO TABS: ORAL_TABLET | ORAL | 1 refills | Status: DC

## 2022-04-18 MED ORDER — FUROSEMIDE 20 MG PO TABS: 20.0000 mg | ORAL_TABLET | Freq: Every day | ORAL | 1 refills | Status: DC

## 2022-04-19 ENCOUNTER — Ambulatory Visit (INDEPENDENT_AMBULATORY_CARE_PROVIDER_SITE_OTHER): Payer: Medicare HMO | Admitting: Podiatry

## 2022-04-19 DIAGNOSIS — M79675 Pain in left toe(s): Secondary | ICD-10-CM | POA: Diagnosis not present

## 2022-04-19 DIAGNOSIS — B351 Tinea unguium: Secondary | ICD-10-CM

## 2022-04-19 DIAGNOSIS — M79674 Pain in right toe(s): Secondary | ICD-10-CM

## 2022-04-23 NOTE — Progress Notes (Signed)
  Subjective:  Patient ID: Virginia Franklin, female    DOB: 02-Jan-1929,  MRN: [id number]  Chief Complaint  Patient presents with   Nail Problem    Thick painful toenails, 10 week follow up    Callouses    88 y.o. female returns for follow-up with the above complaint. History confirmed with patient.  The nails are bothersome but the callus is no longer a problem  Objective:  Physical Exam: tenderness at the surgical site, local edema noted, calf supple, nontender and reduced HAV deformity. Onychomycosis x10 with incurvated nail borders.  Hyperkeratotic painful lesion medial second toe has resolved now   Radiographs left foot: good surgical correction maintained, some regrowth of bone at arthroplasty site Assessment:   1. Pain due to onychomycosis of toenails of both feet        Plan:  Patient was evaluated and treated and all questions answered.  Discussed the etiology and treatment options for the condition in detail with the patient. Educated patient on the topical and oral treatment options for mycotic nails. Recommended debridement of the nails today. Sharp and mechanical debridement performed of all painful and mycotic nails today. Nails debrided in length and thickness using a nail nipper to level of comfort. Discussed treatment options including appropriate shoe gear. Follow up as needed for painful nails.    Return if symptoms worsen or fail to improve.

## 2022-04-27 ENCOUNTER — Telehealth: Payer: Self-pay | Admitting: Family Medicine

## 2022-04-27 ENCOUNTER — Ambulatory Visit (INDEPENDENT_AMBULATORY_CARE_PROVIDER_SITE_OTHER): Payer: Medicare HMO | Admitting: Family

## 2022-04-27 ENCOUNTER — Encounter: Payer: Self-pay | Admitting: Family

## 2022-04-27 VITALS — BP 104/60 | HR 70 | Temp 99.0°F | Ht 67.0 in | Wt 125.5 lb

## 2022-04-27 DIAGNOSIS — L259 Unspecified contact dermatitis, unspecified cause: Secondary | ICD-10-CM

## 2022-04-27 MED ORDER — CLOTRIMAZOLE-BETAMETHASONE 1-0.05 % EX CREA: 1.0000 | TOPICAL_CREAM | Freq: Every day | CUTANEOUS | 0 refills | Status: DC

## 2022-05-01 NOTE — Progress Notes (Signed)
   Acute Office Visit  Subjective:     Patient ID: Virginia Franklin, female    DOB: 08-23-1929, 88 y.o.   MRN: [id number]  Chief Complaint  Patient presents with  . Rash    Patient states the nurse at her home noticed red spots bilateral cheeks today, denies pain or itching    HPI Patient is in today with concerns of a rash on her face that is red and itchy x 2 days. She has a history of shingles. The rash on her face is located  on the left primarily and a small patch on the right as well. Has not applied any creams. No pain or burning.   Review of Systems  Skin:  Positive for itching and rash.  All other systems reviewed and are negative.      Objective:    BP 104/60 (BP Location: Left Arm, Patient Position: Sitting, Cuff Size: Normal)   Pulse 70   Temp 99 F (37.2 C) (Oral)   Ht '5\' 7"'$  (1.702 m)   Wt 125 lb 8 oz (56.9 kg)   SpO2 97%   BMI 19.66 kg/m    Physical Exam Vitals and nursing note reviewed.  HENT:     Right Ear: Tympanic membrane and ear canal normal.     Left Ear: Tympanic membrane and ear canal normal.  Cardiovascular:     Rate and Rhythm: Normal rate and regular rhythm.  Pulmonary:     Effort: Pulmonary effort is normal.     Breath sounds: Normal breath sounds.  Musculoskeletal:     Cervical back: Normal range of motion and neck supple.  Skin:    Findings: Erythema and rash present.     Comments: Papular, erythematous rash noted to the left check that is approx 3 cm and a similar rash approx 1 cm   Neurological:     General: No focal deficit present.     Mental Status: She is oriented to person, place, and time.  Psychiatric:        Mood and Affect: Mood normal.        Behavior: Behavior normal.   No results found for any visits on 04/27/22.      Assessment & Plan:   Problem List Items Addressed This Visit   None Visit Diagnoses     Contact dermatitis, unspecified contact dermatitis type, unspecified trigger    -  Primary        Meds ordered this encounter  Medications  . clotrimazole-betamethasone (LOTRISONE) cream    Sig: Apply 1 Application topically daily.    Dispense:  30 g    Refill:  0    No follow-ups on file.  Kennyth Arnold, FNP

## 2022-05-02 NOTE — Telephone Encounter (Signed)
error 

## 2022-05-09 ENCOUNTER — Ambulatory Visit (INDEPENDENT_AMBULATORY_CARE_PROVIDER_SITE_OTHER): Payer: Medicare HMO

## 2022-05-09 DIAGNOSIS — Z5181 Encounter for therapeutic drug level monitoring: Secondary | ICD-10-CM

## 2022-05-09 DIAGNOSIS — Z952 Presence of prosthetic heart valve: Secondary | ICD-10-CM

## 2022-05-09 DIAGNOSIS — Z7901 Long term (current) use of anticoagulants: Secondary | ICD-10-CM | POA: Diagnosis not present

## 2022-05-09 LAB — POCT INR: INR: 2 (ref 2.0–3.0)

## 2022-05-09 NOTE — Patient Instructions (Signed)
Description   Take 1 tablet today and then continue taking 1/2 tablet daily except for 1 tablet on Mondays. Recheck INR in 3 weeks. [facility] [phone].

## 2022-05-30 ENCOUNTER — Ambulatory Visit (INDEPENDENT_AMBULATORY_CARE_PROVIDER_SITE_OTHER): Payer: Medicare HMO

## 2022-05-30 DIAGNOSIS — Z952 Presence of prosthetic heart valve: Secondary | ICD-10-CM | POA: Diagnosis not present

## 2022-05-30 DIAGNOSIS — Z5181 Encounter for therapeutic drug level monitoring: Secondary | ICD-10-CM

## 2022-05-30 DIAGNOSIS — Z7901 Long term (current) use of anticoagulants: Secondary | ICD-10-CM

## 2022-05-30 LAB — POCT INR: INR: 2.4 (ref 2.0–3.0)

## 2022-05-30 NOTE — Patient Instructions (Signed)
Description   START taking 1/2 tablet daily except for 1 tablet on Mondays and Wednesdays. Recheck INR in 3 weeks. [facility] [phone].

## 2022-06-01 ENCOUNTER — Other Ambulatory Visit: Payer: Self-pay | Admitting: Family Medicine

## 2022-06-18 ENCOUNTER — Ambulatory Visit: Payer: Medicare HMO | Admitting: Physician Assistant

## 2022-06-21 ENCOUNTER — Ambulatory Visit (INDEPENDENT_AMBULATORY_CARE_PROVIDER_SITE_OTHER): Payer: Medicare HMO

## 2022-06-21 DIAGNOSIS — Z952 Presence of prosthetic heart valve: Secondary | ICD-10-CM

## 2022-06-21 DIAGNOSIS — Z7901 Long term (current) use of anticoagulants: Secondary | ICD-10-CM

## 2022-06-21 DIAGNOSIS — Z5181 Encounter for therapeutic drug level monitoring: Secondary | ICD-10-CM

## 2022-06-21 LAB — POCT INR: INR: 3 (ref 2.0–3.0)

## 2022-06-21 NOTE — Patient Instructions (Signed)
Continue 1/2 tablet daily except for 1 tablet on Mondays and Wednesdays. Recheck INR in 4 weeks (per daughter request). [facility] [phone].

## 2022-06-25 ENCOUNTER — Ambulatory Visit: Payer: Medicare HMO | Admitting: Physician Assistant

## 2022-07-03 ENCOUNTER — Ambulatory Visit (INDEPENDENT_AMBULATORY_CARE_PROVIDER_SITE_OTHER): Payer: Medicare HMO | Admitting: Physician Assistant

## 2022-07-03 ENCOUNTER — Encounter: Payer: Self-pay | Admitting: Physician Assistant

## 2022-07-03 VITALS — BP 158/61 | HR 64 | Wt 125.0 lb

## 2022-07-03 DIAGNOSIS — F0280 Dementia in other diseases classified elsewhere without behavioral disturbance: Secondary | ICD-10-CM

## 2022-07-03 DIAGNOSIS — G309 Alzheimer's disease, unspecified: Secondary | ICD-10-CM

## 2022-07-03 DIAGNOSIS — F0150 Vascular dementia without behavioral disturbance: Secondary | ICD-10-CM | POA: Diagnosis not present

## 2022-07-03 NOTE — Progress Notes (Signed)
Assessment/Plan:   Dementia likely due to  Tropic is a very pleasant 88 y.o. 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 Patient is currently on memantine 10 mg bid, tolerating well.  Her memory has significantly declined from prior visit.  She was unable to have her MMSE performed.  Immediate recall was 0.  For mood, the patient has discontinued Lexapro because of increased sleepiness.  She has not been very active, discussed with her daughter the importance of increasing activity to decrease the level of somnolence during the day and for mental stimulation.   Follow up in  6 months. Continue memantine 10 mg bid Patient's daughter to discuss with Dr. Sarajane Jews regarding adding donepezil 5 mg daily to the regimen, as it appears to be a cognitive decline. Continue to control her cardiovascular risk factors Control mood as per PCP Recommend increasing activity, physical and creative. Consider moving from independent living to adult living facility for safety issues.  Patient has a tendency to wander off, so her HHN help has been increased as well, however, she has become more comfortable in being at home and less inclined to participate in different activities that the facility has to offer.   Subjective:    This patient is accompanied in the office by her daughter who supplements the history.  Previous records as well as any outside records available were reviewed prior to todays visit.    Any changes in memory since last visit? Memory is worse, short-term and long-term are affected. She does not like participate much in activities.  She has a friend, Mikki Santee, who has been a good support at night, and she enjoys his company, they had dinner together.  During the day, the patient has home health nursing,  to help her with ADLs. Patient lives with:  [facility] . HHN health has been increased for safety, and now they are to higher more help, to stay with her and make her lunch because at times she does not remember if she ate or not. repeats oneself? Endorsed.  Disoriented when walking into a room?  Patient denies   Leaving objects in unusual places? Lost her partials . Collects napkins, plastic cutlery, potato chips in the fridge Ambulates  with difficulty? Her knees hurt due to arthritis and has increased her level of activity.  She uses a walker to go places, but she forgets to use it at home. Any recent falls or head injuries?  Denies History of seizures?   Patient denies   Wandering behavior?  Patient denies  She tried to walk to [employer] several times by herself.  Patient drives?   Patient no longer drives  Any mood changes since last visit?  Patient denies   Any worsening depression?:  Patient denies   Hallucinations?  Patient denies   Paranoia?  Patient denies   Patient reports that sleeps well without vivid dreams, REM behavior or sleepwalking  .  "She is tired a lot, sometimes she would rather be in bed and sleep ".  History of sleep apnea?  Patient denies   Any hygiene concerns?  Patient denies   Independent of bathing and dressing?  Endorsed  Does the patient needs help with medications?  Denies Who is in charge of the finances?   is in charge    Any changes in appetite? Decreased.  Her daughter is not sure if she is drinking enough water. Patient have trouble swallowing? Patient denies   Does the patient cook?  Patient denies   Any kitchen accidents such as leaving the stove on? Patient denies   Any headaches?  Patient denies   Double vision? Patient denies   Any focal numbness or tingling?  Patient denies   Chronic back pain Patient denies   Unilateral weakness?  Patient denies   Any tremors?  Patient denies   Any history of anosmia?  Patient denies   Any incontinence  of urine?  Endorsed, uses pads; controlled with Ditropan  Any bowel dysfunction?   Patient denies         History on Initial Assessment 01/28/2018: This is an 88 year old right-handed woman with a history of aortic valve replacement on anticoagulation with Coumadin, hypertension, cervical cancer, presenting after hospitalization on 12/16/17 for altered mental status.  Records were reviewed. Patient had reported that a few days prior to admission, she had an episode where she was unable to recall different things in the house. This lasted for a few hours then resolved. On 12/16/17, she was at the Coumadin clinic having her INR checked, when she had an episode where she was reported to zone out and was non-verbal. She did not recall the episode. She recalls having her finger pricked, she denies feeling dizzy or any other warning symptoms. Per notes it lasted 45 seconds, but her daughter reports she was still not back to baseline when she arrived in the hospital. She could not say what year it was or who the president is. Her daughter had reported that over the past 2 years, she has had around 5 episodes of confusion and inability to recall events. She saw a neurologist in [place], per daughter workup showed electrolyte abnormalities and prior strokes. These usually occur upon awakening, it takes her 20-30 minutes to get her bearings. Her daughter has not witnessed them, but her son had reported that she was not quite herself. She drank her electrolyte drink and within 20 minutes was back to normal. At [facility], she had an MRI brain without contrast which I personally reviewed, no acute changes, there was moderate chronic microvascular disease and volume loss. Her wake and sleep EEG was within normal limits. It was felt that she had some underlying dementia and Aricept was recommended, she has not started this.   She moved to [facility] in October 2018 and has been living in a retirement community at [facility]. Her  daughter reported progressive memory problems over the past 5-7 years, worse in the past couple of years. She has gotten lost driving a couple of times and has stopped driving since moving to [place]. She feels her memory is not good, she is forgetting quite a bit. Her daughter took over her finances in November 2018. She manages her own medications, although her daughter reports that one time she checked and a couple of things were off with her medication, she missed an entire day of her pillbox. Her daughter comes twice a week to visit.    She has occasional vertigo from inner ear issues. She has occasional trouble swallowing, neck and back pain, urinary incontinence, constipation. She has occasional tremor on her right hand when painting. She denies any headaches, dizziness, diplopia, dysarthria, focal numbness/tingling/weakness, anosmia. She still feels that her thinking is not straight. She is still forgetting things and repeating conversations. Se denies any olfactory/gustatory hallucinations, deja vu, rising epigastric  sensation, focal numbness/tingling/weakness, myoclonic jerks. She had a normal birth and early development.  There is no history of febrile convulsions, CNS infections such as meningitis/encephalitis, significant traumatic brain injury, neurosurgical procedures, or family history of seizures.   Diagnostic Data: Neuropsychological evaluation in 04/2018 showed mild dementia, most likely due to Alzheimer's disease, but cannot rule out mixed dementia (Alzheimer's and vascular dementia). MRI brain without contrast done 12/2017 no acute changes, there was moderate diffuse volume loss and moderate chronic microvascular disease.  PREVIOUS MEDICATIONS:   CURRENT MEDICATIONS:  Outpatient Encounter Medications as of 07/03/2022  Medication Sig   clotrimazole-betamethasone (LOTRISONE) cream Apply 1 Application topically daily.   ezetimibe-simvastatin (VYTORIN) 10-40 MG tablet TAKE 1 TABLET BY MOUTH  EVERY DAY   furosemide (LASIX) 20 MG tablet Take 1 tablet (20 mg total) by mouth daily.   lisinopril (ZESTRIL) 10 MG tablet TAKE 1 TABLET BY MOUTH DAILY   memantine (NAMENDA) 10 MG tablet TAKE 1 TABLET BY MOUTH TWICE A DAY   oxybutynin (DITROPAN-XL) 5 MG 24 hr tablet Take 1 tablet (5 mg total) by mouth at bedtime.   traMADol (ULTRAM) 50 MG tablet Take 2 tablets (100 mg total) by mouth every 6 (six) hours as needed.   warfarin (COUMADIN) 7.5 MG tablet TAKE ONE-HALF (1/2) TABLET TO 1 TABLET DAILY AS DIRECTED BY THE COUMADIN CLINIC   escitalopram (LEXAPRO) 5 MG tablet TAKE 1 TABLET DAILY (Patient not taking: Reported on 07/03/2022)   No facility-administered encounter medications on file as of 07/03/2022.       04/06/2020   10:00 AM  MMSE - Mini Mental State Exam  Orientation to time 1  Orientation to Place 1  Registration 3  Attention/ Calculation 5  Recall 0  Language- name 2 objects 2  Language- repeat 1  Language- follow 3 step command 3  Language- read & follow direction 1  Write a sentence 1  Copy design 1  Total score 19      01/28/2018    1:00 PM  Montreal Cognitive Assessment   Visuospatial/ Executive (0/5) 5  Naming (0/3) 3  Attention: Read list of digits (0/2) 1  Attention: Read list of letters (0/1) 1  Attention: Serial 7 subtraction starting at 100 (0/3) 3  Language: Repeat phrase (0/2) 0  Language : Fluency (0/1) 0  Abstraction (0/2) 1  Delayed Recall (0/5) 2  Orientation (0/6) 4  Total 20    Objective:     PHYSICAL EXAMINATION:    VITALS:   Vitals:   07/03/22 1353  BP: (!) 158/61  Pulse: 64  SpO2: 96%  Weight: 125 lb (56.7 kg)    GEN:  The patient appears stated age and is in NAD. HEENT:  Normocephalic, atraumatic.   Neurological examination:  General: NAD, well-groomed, appears stated age. Orientation: The patient is alert. Oriented to person, not to place or date. Cranial nerves: There is good facial symmetry.The speech is fluent and clear.  No aphasia or dysarthria. Fund of knowledge is reduced.  Recent and remote memory are impaired. Attention and concentration are reduced.  Unable to name objects and repeat phrases.  Hearing is intact to conversational tone but has difficulty with other accents. Sensation: Sensation is intact to light touch throughout Motor: Strength is at least antigravity x4. Tremors: none  DTR's 2/4 in UE/LE     Movement examination: Tone: There is normal tone in the UE/LE Abnormal movements:  no tremor.  No myoclonus.  No asterixis.   Coordination:  There is  no decremation with RAM's. Normal finger to nose  Gait and Station: The patient has no difficulty arising out of a deep-seated chair without the use of the hands. The patient's stride length is short, shuffling sometimes.  Gait is cautious and narrow.    Thank you for allowing [place] the opportunity to participate in the care of this nice patient. Please do not hesitate to contact us for any questions or concerns.   Total time spent on today's visit was 45 minutes dedicated to this patient today, preparing to see patient, examining the patient, ordering tests and/or medications and counseling the patient, documenting clinical information in the EHR or other health record, independently interpreting results and communicating results to the patient/family, discussing treatment and goals, answering patient's questions and coordinating care.  Cc:  [name], MD  Sharene Butters 07/03/2022 2:24 PM

## 2022-07-03 NOTE — Patient Instructions (Addendum)
It was a pleasure to see you today at our office.   Recommendations:  Follow up in 6  months Continue Memantine 10 mg twice daily.Side effects were discussed  Consider adding donepezil 5 daily if ok with Dr. Sarajane Jews  Mood control as per primary doctor   Whom to call:  Memory  decline, memory medications: Call our office [phone]   For psychiatric meds, mood meds: Please have your primary care physician manage these medications.   Counseling regarding caregiver distress, including caregiver depression, anxiety and issues regarding community resources, adult day care programs, adult living facilities, or memory care questions:   Feel free to contact [place], Social Worker at [phone]   For assessment of decision of mental capacity and competency:  Call Dr. Anthoney Harada, geriatric psychiatrist at [phone]  For guidance in geriatric dementia issues please call [facility] [phone]  For guidance regarding [facility] and if placement were needed at the facility, contact Arnell Asal, Social Worker tel: [phone]  If you have any severe symptoms of a stroke, or other severe issues such as confusion,severe chills or fever, etc call 911 or go to the ER as you may need to be evaluated further   Feel free to visit Facebook page " [email or web address]" for tips of how to care for people with memory problems.      RECOMMENDATIONS FOR ALL PATIENTS WITH MEMORY PROBLEMS: 1. Continue to exercise (Recommend 30 minutes of walking everyday, or 3 hours every week) 2. Increase social interactions - continue going to [place] and enjoy social gatherings with friends and family 3. Eat healthy, avoid fried foods and eat more fruits and vegetables 4. Maintain adequate blood pressure, blood sugar, and blood cholesterol level. Reducing the risk of stroke and cardiovascular disease also helps promoting better memory. 5. Avoid stressful situations. Live a simple  life and avoid aggravations. Organize your time and prepare for the next day in anticipation. 6. Sleep well, avoid any interruptions of sleep and avoid any distractions in the bedroom that may interfere with adequate sleep quality 7. Avoid sugar, avoid sweets as there is a strong link between excessive sugar intake, diabetes, and cognitive impairment We discussed the Mediterranean diet, which has been shown to help patients reduce the risk of progressive memory disorders and reduces cardiovascular risk. This includes eating fish, eat fruits and green leafy vegetables, nuts like almonds and hazelnuts, walnuts, and also use olive oil. Avoid fast foods and fried foods as much as possible. Avoid sweets and sugar as sugar use has been linked to worsening of memory function.  There is always a concern of gradual progression of memory problems. If this is the case, then we may need to adjust level of care according to patient needs. Support, both to the patient and caregiver, should then be put into place.    The Alzheimer's Association is here all day, every day for people facing Alzheimer's disease through our free 24/7 Helpline: 916-804-7919. The Helpline provides reliable information and support to all those who need assistance, such as individuals living with memory loss, Alzheimer's or other dementia, caregivers, health care professionals and the public.  Our highly trained and knowledgeable staff can help you with: Understanding memory loss, dementia and Alzheimer's  Medications and other treatment options  General information about aging and brain health  Skills to provide quality care and to find the best care from professionals  Legal, financial and living-arrangement decisions Our Helpline also features: Confidential care consultation  provided by master's level clinicians who can help with decision-making support, crisis assistance and education on issues families face every day  Help in a  caller's preferred language using our translation service that features more than 200 languages and dialects  Referrals to local community programs, services and ongoing support     FALL PRECAUTIONS: Be cautious when walking. Scan the area for obstacles that may increase the risk of trips and falls. When getting up in the mornings, sit up at the edge of the bed for a few minutes before getting out of bed. Consider elevating the bed at the head end to avoid drop of blood pressure when getting up. Walk always in a well-lit room (use night lights in the walls). Avoid area rugs or power cords from appliances in the middle of the walkways. Use a walker or a cane if necessary and consider physical therapy for balance exercise. Get your eyesight checked regularly.  FINANCIAL OVERSIGHT: Supervision, especially oversight when making financial decisions or transactions is also recommended.  HOME SAFETY: Consider the safety of the kitchen when operating appliances like stoves, microwave oven, and blender. Consider having supervision and share cooking responsibilities until no longer able to participate in those. Accidents with firearms and other hazards in the house should be identified and addressed as well.   ABILITY TO BE LEFT ALONE: If patient is unable to contact 911 operator, consider using LifeLine, or when the need is there, arrange for someone to stay with patients. Smoking is a fire hazard, consider supervision or cessation. Risk of wandering should be assessed by caregiver and if detected at any point, supervision and safe proof recommendations should be instituted.  MEDICATION SUPERVISION: Inability to self-administer medication needs to be constantly addressed. Implement a mechanism to ensure safe administration of the medications.   DRIVING: Regarding driving, in patients with progressive memory problems, driving will be impaired. We advise to have someone else do the driving if trouble finding  directions or if minor accidents are reported. Independent driving assessment is available to determine safety of driving.   If you are interested in the driving assessment, you can contact the following:  [employer] in [place]  [place] [phone] or [phone]      [place] refers to food and lifestyle choices that are based on the traditions of countries located on the [employer]. This way of eating has been shown to help prevent certain conditions and improve outcomes for people who have chronic diseases, like kidney disease and heart disease. What are tips for following this plan? Lifestyle  Cook and eat meals together with your family, when possible. Drink enough fluid to keep your urine clear or pale yellow. Be physically active every day. This includes: Aerobic exercise like running or swimming. Leisure activities like gardening, walking, or housework. Get 7-8 hours of sleep each night. If recommended by your health care provider, drink red wine in moderation. This means 1 glass a day for nonpregnant women and 2 glasses a day for men. A glass of wine equals 5 oz (150 mL). Reading food labels  Check the serving size of packaged foods. For foods such as rice and pasta, the serving size refers to the amount of cooked product, not dry. Check the total fat in packaged foods. Avoid foods that have saturated fat or trans fats. Check the ingredients list for added sugars, such as corn syrup.  Shopping  At the grocery store, buy most of your food from the areas near the walls of the store. This includes: Fresh fruits and vegetables (produce). Grains, beans, nuts, and seeds. Some of these may be available in unpackaged forms or large amounts (in bulk). Fresh seafood. Poultry and eggs. Low-fat dairy products. Buy whole  ingredients instead of prepackaged foods. Buy fresh fruits and vegetables in-season from local farmers markets. Buy frozen fruits and vegetables in resealable bags. If you do not have access to quality fresh seafood, buy precooked frozen shrimp or canned fish, such as tuna, salmon, or sardines. Buy small amounts of raw or cooked vegetables, salads, or olives from the deli or salad bar at your store. Stock your pantry so you always have certain foods on hand, such as olive oil, canned tuna, canned tomatoes, rice, pasta, and beans. Cooking  Cook foods with extra-virgin olive oil instead of using butter or other vegetable oils. Have meat as a side dish, and have vegetables or grains as your main dish. This means having meat in small portions or adding small amounts of meat to foods like pasta or stew. Use beans or vegetables instead of meat in common dishes like chili or lasagna. Experiment with different cooking methods. Try roasting or broiling vegetables instead of steaming or sauteing them. Add frozen vegetables to soups, stews, pasta, or rice. Add nuts or seeds for added healthy fat at each meal. You can add these to yogurt, salads, or vegetable dishes. Marinate fish or vegetables using olive oil, lemon juice, garlic, and fresh herbs. Meal planning  Plan to eat 1 vegetarian meal one day each week. Try to work up to 2 vegetarian meals, if possible. Eat seafood 2 or more times a week. Have healthy snacks readily available, such as: Vegetable sticks with hummus. Greek yogurt. Fruit and nut trail mix. Eat balanced meals throughout the week. This includes: Fruit: 2-3 servings a day Vegetables: 4-5 servings a day Low-fat dairy: 2 servings a day Fish, poultry, or lean meat: 1 serving a day Beans and legumes: 2 or more servings a week Nuts and seeds: 1-2 servings a day Whole grains: 6-8 servings a day Extra-virgin olive oil: 3-4 servings a day Limit red meat and sweets to only a few servings  a month What are my food choices? Mediterranean diet Recommended Grains: Whole-grain pasta. Brown rice. Bulgar wheat. Polenta. Couscous. Whole-wheat bread. [place]. Vegetables: Artichokes. Beets. Broccoli. Cabbage. Carrots. Eggplant. Green beans. Chard. Kale. Spinach. Onions. Leeks. Peas. Squash. Tomatoes. Peppers. Radishes. Fruits: Apples. Apricots. Avocado. Berries. Bananas. Cherries. Dates. Figs. Grapes. Lemons. Melon. Oranges. Peaches. Plums. Pomegranate. Meats and other protein foods: Beans. Almonds. Sunflower seeds. Pine nuts. Peanuts. [place]. Salmon. Scallops. Shrimp. [place]. Tilapia. Clams. Oysters. Eggs. Dairy: Low-fat milk. Cheese. Greek yogurt. Beverages: Water. Red wine. Herbal tea. Fats and oils: Extra virgin olive oil. Avocado oil. Grape seed oil. Sweets and desserts: [place] yogurt with honey. Baked apples. Poached pears. Trail mix. Seasoning and other foods: Basil. Cilantro. Coriander. Cumin. Mint. Parsley. Sage. Rosemary. Tarragon. Garlic. Oregano. Thyme. Pepper. Balsalmic vinegar. Tahini. Hummus. Tomato sauce. Olives. Mushrooms. Limit these Grains: Prepackaged pasta or rice dishes. Prepackaged cereal with added sugar. Vegetables: Deep fried potatoes (french fries). Fruits: Fruit canned in syrup. Meats and other protein foods: Beef. Pork. Lamb. Poultry with skin. Hot dogs. [place] Salines. Dairy: Ice cream. Sour cream. Whole milk. Beverages: Juice. Sugar-sweetened soft drinks. Beer. Liquor and spirits. Fats and oils: Butter. Canola oil. Vegetable oil. Beef fat (tallow). Lard. Sweets and  desserts: Cookies. Cakes. Pies. Candy. Seasoning and other foods: Mayonnaise. Premade sauces and marinades. The items listed may not be a complete list. Talk with your dietitian about what dietary choices are right for you. Summary The Mediterranean diet includes both food and lifestyle choices. Eat a variety of fresh fruits and vegetables, beans, nuts, seeds, and whole grains. Limit the amount of  red meat and sweets that you eat. Talk with your health care provider about whether it is safe for you to drink red wine in moderation. This means 1 glass a day for nonpregnant women and 2 glasses a day for men. A glass of wine equals 5 oz (150 mL). This information is not intended to replace advice given to you by your health care provider. Make sure you discuss any questions you have with your health care provider. Document Released: 06/21/2016 Document Revised: 07/24/2016 Document Reviewed: 06/21/2016 Elsevier Interactive Patient Education  2017 [employer].

## 2022-07-05 ENCOUNTER — Encounter: Payer: Self-pay | Admitting: Family Medicine

## 2022-07-05 MED ORDER — OXYBUTYNIN CHLORIDE ER 5 MG PO TB24: 5.0000 mg | ORAL_TABLET | Freq: Every day | ORAL | 3 refills | Status: DC

## 2022-07-05 MED ORDER — DONEPEZIL HCL 5 MG PO TABS: 5.0000 mg | ORAL_TABLET | Freq: Every day | ORAL | 3 refills | Status: DC

## 2022-07-05 NOTE — Telephone Encounter (Signed)
[facility]

## 2022-07-06 NOTE — Telephone Encounter (Signed)
I checked and Pennsaid cream is not covered by her insurance. I can prescribe it if they wish to pay cash for it

## 2022-07-09 NOTE — Telephone Encounter (Signed)
Patient requesting for prescription for Pennsaid Cream sent to the pharmacy.

## 2022-07-17 ENCOUNTER — Ambulatory Visit: Payer: Medicare HMO | Attending: Interventional Cardiology

## 2022-07-17 DIAGNOSIS — Z5181 Encounter for therapeutic drug level monitoring: Secondary | ICD-10-CM | POA: Diagnosis not present

## 2022-07-17 DIAGNOSIS — Z7901 Long term (current) use of anticoagulants: Secondary | ICD-10-CM

## 2022-07-17 DIAGNOSIS — Z952 Presence of prosthetic heart valve: Secondary | ICD-10-CM

## 2022-07-17 LAB — POCT INR: INR: 2 (ref 2.0–3.0)

## 2022-07-17 NOTE — Patient Instructions (Signed)
Description   Take an extra 1/2 tablet today, then resume same dosage 1/2 tablet daily except for 1 tablet on Mondays and Wednesdays. Recheck INR in 4 weeks (per daughter request). [facility] [phone].

## 2022-07-31 ENCOUNTER — Other Ambulatory Visit: Payer: Self-pay | Admitting: Interventional Cardiology

## 2022-07-31 DIAGNOSIS — Z952 Presence of prosthetic heart valve: Secondary | ICD-10-CM

## 2022-07-31 DIAGNOSIS — Z5181 Encounter for therapeutic drug level monitoring: Secondary | ICD-10-CM

## 2022-08-15 ENCOUNTER — Ambulatory Visit: Payer: Medicare HMO | Attending: Cardiology | Admitting: *Deleted

## 2022-08-15 DIAGNOSIS — Z952 Presence of prosthetic heart valve: Secondary | ICD-10-CM | POA: Diagnosis not present

## 2022-08-15 DIAGNOSIS — Z7901 Long term (current) use of anticoagulants: Secondary | ICD-10-CM

## 2022-08-15 DIAGNOSIS — Z5181 Encounter for therapeutic drug level monitoring: Secondary | ICD-10-CM

## 2022-08-15 LAB — POCT INR: INR: 2.2 (ref 2.0–3.0)

## 2022-08-15 NOTE — Patient Instructions (Signed)
Description   Today take 1.5 tablets then start 1/2 tablet daily except for 1 tablet on Mondays, Wednesdays and Fridays. Recheck INR in 4 weeks (per daughter request). [facility] [phone].

## 2022-08-21 ENCOUNTER — Emergency Department (HOSPITAL_COMMUNITY)
Admission: EM | Admit: 2022-08-21 | Discharge: 2022-08-21 | Disposition: A | Discharge status: Skilled Nursing Facility | Payer: Medicare HMO | Attending: Emergency Medicine | Admitting: Emergency Medicine

## 2022-08-21 ENCOUNTER — Other Ambulatory Visit: Payer: Self-pay

## 2022-08-21 ENCOUNTER — Encounter (HOSPITAL_COMMUNITY): Payer: Self-pay

## 2022-08-21 DIAGNOSIS — R209 Unspecified disturbances of skin sensation: Secondary | ICD-10-CM | POA: Diagnosis not present

## 2022-08-21 DIAGNOSIS — F0390 Unspecified dementia without behavioral disturbance: Secondary | ICD-10-CM | POA: Diagnosis not present

## 2022-08-21 DIAGNOSIS — I4891 Unspecified atrial fibrillation: Secondary | ICD-10-CM | POA: Diagnosis present

## 2022-08-21 DIAGNOSIS — I10 Essential (primary) hypertension: Secondary | ICD-10-CM | POA: Insufficient documentation

## 2022-08-21 DIAGNOSIS — Z7902 Long term (current) use of antithrombotics/antiplatelets: Secondary | ICD-10-CM | POA: Insufficient documentation

## 2022-08-21 DIAGNOSIS — R202 Paresthesia of skin: Secondary | ICD-10-CM

## 2022-08-21 DIAGNOSIS — R791 Abnormal coagulation profile: Secondary | ICD-10-CM | POA: Diagnosis not present

## 2022-08-21 DIAGNOSIS — Z79899 Other long term (current) drug therapy: Secondary | ICD-10-CM | POA: Insufficient documentation

## 2022-08-21 LAB — PROTIME-INR
INR: 2.5 — ABNORMAL HIGH (ref 0.8–1.2)
Prothrombin Time: 26.8 seconds — ABNORMAL HIGH (ref 11.4–15.2)

## 2022-08-21 LAB — BASIC METABOLIC PANEL
Anion gap: 6 (ref 5–15)
BUN: 16 mg/dL (ref 8–23)
CO2: 24 mmol/L (ref 22–32)
Calcium: 8.8 mg/dL — ABNORMAL LOW (ref 8.9–10.3)
Chloride: 108 mmol/L (ref 98–111)
Creatinine, Ser: 0.8 mg/dL (ref 0.44–1.00)
GFR, Estimated: >60 mL/min (ref >60)
Glucose, Bld: 95 mg/dL (ref 70–99)
Potassium: 3.6 mmol/L (ref 3.5–5.1)
Sodium: 138 mmol/L (ref 135–145)

## 2022-08-21 LAB — CBC WITH DIFFERENTIAL/PLATELET
Abs Immature Granulocytes: 0.01 10*3/uL (ref 0.00–0.07)
Basophils Absolute: 0 10*3/uL (ref 0.0–0.1)
Basophils Relative: 1 %
Eosinophils Absolute: 0.1 10*3/uL (ref 0.0–0.5)
Eosinophils Relative: 2 %
HCT: 38 % (ref 36.0–46.0)
Hemoglobin: 12.5 g/dL (ref 12.0–15.0)
Immature Granulocytes: 0 %
Lymphocytes Relative: 27 %
Lymphs Abs: 1.1 10*3/uL (ref 0.7–4.0)
MCH: 30.7 pg (ref 26.0–34.0)
MCHC: 32.9 g/dL (ref 30.0–36.0)
MCV: 93.4 fL (ref 80.0–100.0)
Monocytes Absolute: 0.4 10*3/uL (ref 0.1–1.0)
Monocytes Relative: 11 %
Neutro Abs: 2.5 10*3/uL (ref 1.7–7.7)
Neutrophils Relative %: 59 %
Platelets: 177 10*3/uL (ref 150–400)
RBC: 4.07 MIL/uL (ref 3.87–5.11)
RDW: 13.5 % (ref 11.5–15.5)
WBC: 4.2 10*3/uL (ref 4.0–10.5)
nRBC: 0 % (ref 0.0–0.2)

## 2022-08-21 LAB — MAGNESIUM: Magnesium: 1.9 mg/dL (ref 1.7–2.4)

## 2022-08-21 NOTE — ED Provider Notes (Signed)
  Physical Exam  BP (!) 142/66   Pulse (!) 51   Temp 97.7 F (36.5 C) (Oral)   Resp 14   Ht '5\' 7"'$  (1.702 m)   Wt 56.5 kg   SpO2 98%   BMI 19.51 kg/m   Physical Exam Vitals and nursing note reviewed.  Constitutional:      General: She is not in acute distress.    Appearance: She is well-developed.  HENT:     Head: Normocephalic and atraumatic.  Eyes:     Conjunctiva/sclera: Conjunctivae normal.  Cardiovascular:     Rate and Rhythm: Normal rate and regular rhythm.     Heart sounds: No murmur heard. Pulmonary:     Effort: Pulmonary effort is normal. No respiratory distress.     Breath sounds: Normal breath sounds.  Abdominal:     Palpations: Abdomen is soft.     Tenderness: There is no abdominal tenderness.  Musculoskeletal:        General: No swelling.     Cervical back: Neck supple.  Skin:    General: Skin is warm and dry.     Capillary Refill: Capillary refill takes less than 2 seconds.  Neurological:     Mental Status: She is alert.  Psychiatric:        Mood and Affect: Mood normal.     Procedures  Procedures  ED Course / MDM   Clinical Course as of 08/21/22 0904  Tue Aug 21, 2022  0705 Pending labs, likely dc [MK]    Clinical Course User Index [MK] Lucile Didonato, MD   Medical Decision Making Amount and/or Complexity of Data Reviewed Labs: ordered.   Patient received in handoff.  Woke up with full body numbness that lasted a few minutes but quickly resolved and is currently entirely resolved in the emergency department.  Patient was reportedly sent for concern for new onset A-fib.  Her ECG does not show atrial fibrillation today and is likely sinus rhythm with PACs or sinus arrhythmia.  Pending labs at time of signout with plan for discharge if labs unremarkable.  Laboratory evaluation with a therapeutic INR on her warfarin but is otherwise unremarkable and she was discharged back to her facility.  It is certainly possible that the patient had an  orthostatic event today or even an extended pause given her bradycardia.  Her family was encouraged to reach out to her cardiologist today to discuss possibility of a Zio patch but will defer to the patient's cardiologist about utility in this scenario.  Cardiology referral placed and patient discharged       Teressa Lower, MD 08/21/22 [phone]

## 2022-08-21 NOTE — ED Notes (Signed)
Pt ambulated to the restroom with assistance.  

## 2022-08-21 NOTE — ED Triage Notes (Addendum)
Pt from [place]. Pt woke  up with tingling in legs and arms. When EMS arrived pt was afib on monitor rate from 50-62. EMS assuming new onset a fib - no hx listed and pt unable to tell EMS. Hx of Alzheimer's/Dementia Pt able to say she takes medicine for her heart.  VS with EMS  150/82 96%RA  CBG 110 20 LAC

## 2022-08-21 NOTE — ED Provider Notes (Signed)
[facility] EMERGENCY DEPARTMENT Provider Note   CSN: [id number] Arrival date & time: 08/21/22  9147     History  Chief Complaint  Patient presents with   Atrial Fibrillation    Virginia Franklin is a 88 y.o. female.  HPI     This is a 88 year old female with reported history of dementia, CVA, hypertension who presents with concerns for new onset atrial fibrillation.  Per EMS, she felt tingly all over this morning.  They hooked her up to the monitor and she was apparently in atrial for with a rate 50-62.  Patient can only tell me that her feet tingle this morning.  She denies chest pain, shortness of breath, abdominal pain.  She is oriented to herself and place but not time.  This is her baseline per her report.  I reviewed EMS rhythm strips.  It appears that she is in an ectopic atrial rhythm.  Does not appear consistent with atrial fibrillation.  Repeat EKG here also confirms likely ectopic atrial rhythm versus sinus with PACs.  Patient is relatively asymptomatic.  We will check some basic lab work.  Home Medications Prior to Admission medications   Medication Sig Start Date End Date Taking? Authorizing Provider  clotrimazole-betamethasone (LOTRISONE) cream Apply 1 Application topically daily. 04/27/22   Kennyth Arnold, FNP  donepezil (ARICEPT) 5 MG tablet Take 1 tablet (5 mg total) by mouth at bedtime. 07/05/22   [name], MD  escitalopram (LEXAPRO) 5 MG tablet TAKE 1 TABLET DAILY Patient not taking: Reported on 07/03/2022 06/05/21   [name], MD  ezetimibe-simvastatin (VYTORIN) 10-40 MG tablet TAKE 1 TABLET BY MOUTH EVERY DAY 02/12/22   [name], MD  furosemide (LASIX) 20 MG tablet Take 1 tablet (20 mg total) by mouth daily. 04/18/22   [name], MD  lisinopril (ZESTRIL) 10 MG tablet TAKE 1 TABLET BY MOUTH DAILY 04/18/22   [name], MD  memantine (NAMENDA) 10 MG tablet TAKE 1 TABLET BY MOUTH TWICE A DAY 02/09/22   [name], MD   oxybutynin (DITROPAN-XL) 5 MG 24 hr tablet Take 1 tablet (5 mg total) by mouth at bedtime. 07/05/22   [name], MD  traMADol (ULTRAM) 50 MG tablet Take 2 tablets (100 mg total) by mouth every 6 (six) hours as needed. 12/19/20   [name], MD  warfarin (COUMADIN) 7.5 MG tablet TAKE ONE-HALF (1/2) TO ONE TABLET DAILY AS DIRECTED BY THE COUMADIN CLINIC 07/31/22   Belva Crome, MD      Allergies    Penicillins    Review of Systems   Review of Systems  Unable to perform ROS: Dementia    Physical Exam Updated Vital Signs BP (!) 164/78   Pulse (!) 55   Temp 98.1 F (36.7 C) (Oral)   Resp 16   SpO2 94%  Physical Exam Vitals and nursing note reviewed.  Constitutional:      Appearance: She is well-developed. She is not ill-appearing.  HENT:     Head: Normocephalic and atraumatic.  Eyes:     Pupils: Pupils are equal, round, and reactive to light.  Cardiovascular:     Rate and Rhythm: Normal rate. Rhythm irregular.     Heart sounds: Normal heart sounds.  Pulmonary:     Effort: Pulmonary effort is normal. No respiratory distress.     Breath sounds: No wheezing.  Abdominal:     Palpations: Abdomen is soft.  Musculoskeletal:  Cervical back: Neck supple.  Skin:    General: Skin is warm and dry.  Neurological:     Mental Status: She is alert.     Comments: Oriented x2  Psychiatric:        Mood and Affect: Mood normal.     ED Results / Procedures / Treatments   Labs (all labs ordered are listed, but only abnormal results are displayed) Labs Reviewed - No data to display  EKG EKG Interpretation  Date/Time:  Tuesday August 21 2022 06:18:13 EDT Ventricular Rate:  52 PR Interval:  240 QRS Duration: 98 QT Interval:  459 QTC Calculation: 427 R Axis:   51 Text Interpretation: Sinus or ectopic atrial rhythm Atrial premature complexes Prolonged PR interval Confirmed by Thayer Jew [phone]) on 08/21/2022 6:21:22 AM  Radiology No results  found.  Procedures Procedures    Medications Ordered in ED Medications - No data to display  ED Course/ Medical Decision Making/ A&P Clinical Course as of 08/21/22 2256  Tue Aug 21, 2022  0705 Pending labs, likely dc [MK]    Clinical Course User Index [MK] Kommor, [place], MD                           Medical Decision Making Amount and/or Complexity of Data Reviewed Labs: ordered.   This patient presents to the ED for concern of irregular heart rhythm, this involves an extensive number of treatment options, and is a complaint that carries with it a high risk of complications and morbidity.  I considered the following differential and admission for this acute, potentially life threatening condition.  The differential diagnosis includes arrhythmia, atrial fibrillation  MDM:    This is a 88 year old female who presented with concerns for new onset atrial fibrillation.  She woke up this morning with tingling in her feet.  She has no other complaint.  She does have a history of dementia.  I reviewed EMS strips and EKG here in the emergency department.  She does not appear to be in new onset atrial fibrillation.  It appears to be sinus arrhythmia and/or PACs.  He is otherwise asymptomatic.  We will obtain some basic metabolic lab work given tingling.  She is nonfocal.  Daughter is at the bedside and reports that she is at her baseline  (Labs, imaging, consults)  Labs: I Ordered, and personally interpreted labs.  The pertinent results include: Pending  Imaging Studies ordered: I ordered imaging studies including none I independently visualized and interpreted imaging. I agree with the radiologist interpretation  Additional history obtained from EMS, daughter.  External records from outside source obtained and reviewed including prior evaluations  Cardiac Monitoring: The patient was maintained on a cardiac monitor.  I personally viewed and interpreted the cardiac monitored which  showed an underlying rhythm of: Sinus arrhythmia  Reevaluation: After the interventions noted above, I reevaluated the patient and found that they have :stayed the same  Social Determinants of Health: Alzheimer's dementia  Disposition: Discharge likely  Co morbidities that complicate the patient evaluation  Past Medical History:  Diagnosis Date   Alzheimer's dementia ([place]) 2017   Arthritis    OA   Body mass index (BMI) of 22.0-22.9 in adult    Cervical cancer ([place])    had hysterctomy for   H/O osteoporosis    H/O: CVA (cerebrovascular accident) 2012   , HEMORRHAGIC 09-04-2019 ALSO   H/O: osteoarthritis    Hammertoe of left foot  History of urinary frequency    Hx of diverticulitis of colon    Hypertension    Lumbar stress fracture lL3, 01-2020     Medicines No orders of the defined types were placed in this encounter.   I have reviewed the patients home medicines and have made adjustments as needed  Problem List / ED Course: Problem List Items Addressed This Visit   None Visit Diagnoses     Tingling    -  Primary   Relevant Orders   Ambulatory referral to Cardiology                   Final Clinical Impression(s) / ED Diagnoses Final diagnoses:  None    Rx / DC Orders ED Discharge Orders     None         Sukhman Kocher, Barbette Hair, MD 08/21/22 2300

## 2022-08-22 ENCOUNTER — Other Ambulatory Visit: Payer: Self-pay | Admitting: Family Medicine

## 2022-08-28 NOTE — Progress Notes (Unsigned)
Office Visit    Patient Name: Virginia Franklin Date of Encounter: 08/28/2022  Primary Care Provider:  [name], MD Primary Cardiologist:  Sinclair Grooms, MD Primary Electrophysiologist: None  Chief Complaint    Virginia Franklin is a 89 y.o. female with PMH of HTN, HLD embolic CVA, AVR with mechanical valve 1990 due to rheumatic fever (on chronic Coumadin), Alzheimer's dementia who presents today for post ED follow-up.  Past Medical History    Past Medical History:  Diagnosis Date   Alzheimer's dementia ([place]) 2017   Arthritis    OA   Body mass index (BMI) of 22.0-22.9 in adult    Cervical cancer (HCC)    had hysterctomy for   H/O osteoporosis    H/O: CVA (cerebrovascular accident) 2012   , HEMORRHAGIC 09-04-2019 ALSO   H/O: osteoarthritis    Hammertoe of left foot    History of urinary frequency    Hx of diverticulitis of colon    Hypertension    Lumbar stress fracture lL3, 01-2020   Past Surgical History:  Procedure Laterality Date   ABDOMINAL HYSTERECTOMY     CATARACT EXTRACTION Bilateral    HAMMER TOE SURGERY Left 07/29/2020   Procedure: HAMMER TOE REPAIR 2ND TOE LEFT;  Surgeon: [name], DPM;  Location: [facility];  Service: Podiatry;  Laterality: Left;   INCISION AND DRAINAGE Bilateral 07/29/2020   Procedure: EXCISION OF WOUND OF LEFT SECOND TOE; RELEASE OF TENDONS LEFT THIRD TOE, RIGHT THIRD TOE;  Surgeon: [name], DPM;  Location: [facility];  Service: Podiatry;  Laterality: Bilateral;   mechanical heart valve  1990   st jude done in [place]      Allergies  Allergies  Allergen Reactions   Penicillins Anaphylaxis and Swelling    Has patient had a PCN reaction causing immediate rash, facial/tongue/throat swelling, SOB or lightheadedness with hypotension: Yes Has patient had a PCN reaction causing severe rash involving mucus membranes or skin necrosis: No Has  patient had a PCN reaction that required hospitalization: No Has patient had a PCN reaction occurring within the last 10 years: No If all of the above answers are "NO", then may proceed with Cephalosporin use.    History of Present Illness    Virginia Franklin  is a 88 year old female with the above mention past medical history who presents today for post ED follow-up of palpitations.  She was originally seen by Dr. Tamala Julian in 2018 to establish cardiac care.  She has a history of mechanical AVR due to rheumatic fever as a child.  She was seen in 12/2017 for complaint of syncopal episode.  She had both a CT and MRI with no evidence of acute brain injury.  Carotid Doppler was also performed along with 2D echo that showed mild plaquing in both carotids.  2D echo revealed EF of 60-65%, with normal active aortic valve and no RWMA.  She was admitted in 09/2019 for acute intracranial hemorrhage in the setting of hypertension and Coumadin.  Patient's ASA was discontinued following bridge to therapeutic INR.  She was last seen by Dr. Tamala Julian on 06/2021 and had reported 1 falls since being seen previous.  She was started on Namenda due to difficulty with memory and confusion.  She was seen in the ED on 08/21/2022 with report of atrial fibrillation.  Patient also endorsed feeling of tingling over her body EMS was contacted  and rhythm strip showed rate of 50 to 62 bpm.  Repeat EKG confirmed ectopic atrial rhythm versus sinus with PACs.  She was asymptomatic follow-up was that patient had possible orthostatic event or extended pauses. Visit note Since last being seen in the office patient reports***.  Patient denies chest pain, palpitations, dyspnea, PND, orthopnea, nausea, vomiting, dizziness, syncope, edema, weight gain, or early satiety.  ***Notes: SBE prophylaxis -Patient may require ZIO monitor Home Medications    Current Outpatient Medications  Medication Sig Dispense Refill   clotrimazole-betamethasone  (LOTRISONE) cream Apply 1 Application topically daily. 30 g 0   donepezil (ARICEPT) 5 MG tablet Take 1 tablet (5 mg total) by mouth at bedtime. 90 tablet 3   escitalopram (LEXAPRO) 5 MG tablet Take 1 tablet (5 mg total) by mouth daily. 90 tablet 0   ezetimibe-simvastatin (VYTORIN) 10-40 MG tablet TAKE 1 TABLET BY MOUTH EVERY DAY 90 tablet 3   furosemide (LASIX) 20 MG tablet Take 1 tablet (20 mg total) by mouth daily. 90 tablet 1   lisinopril (ZESTRIL) 10 MG tablet TAKE 1 TABLET BY MOUTH DAILY 90 tablet 1   memantine (NAMENDA) 10 MG tablet TAKE 1 TABLET BY MOUTH TWICE A DAY 180 tablet 3   oxybutynin (DITROPAN-XL) 5 MG 24 hr tablet Take 1 tablet (5 mg total) by mouth at bedtime. 90 tablet 3   traMADol (ULTRAM) 50 MG tablet Take 2 tablets (100 mg total) by mouth every 6 (six) hours as needed. 60 tablet 5   warfarin (COUMADIN) 7.5 MG tablet TAKE ONE-HALF (1/2) TO ONE TABLET DAILY AS DIRECTED BY THE COUMADIN CLINIC 70 tablet 3   No current facility-administered medications for this visit.     Review of Systems  Please see the history of present illness.    (+)*** (+)***  All other systems reviewed and are otherwise negative except as noted above.  Physical Exam    Wt Readings from Last 3 Encounters:  08/21/22 124 lb 9 oz (56.5 kg)  07/03/22 125 lb (56.7 kg)  04/27/22 125 lb 8 oz (56.9 kg)   QJ:JHERD were no vitals filed for this visit.,There is no height or weight on file to calculate BMI.  Constitutional:      Appearance: Healthy appearance. Not in distress.  Neck:     Vascular: JVD normal.  Pulmonary:     Effort: Pulmonary effort is normal.     Breath sounds: No wheezing. No rales. Diminished in the bases Cardiovascular:     Normal rate. Regular rhythm. Normal S1. Normal S2.      Murmurs: There is no murmur.  Edema:    Peripheral edema absent.  Abdominal:     Palpations: Abdomen is soft non tender. There is no hepatomegaly.  Skin:    General: Skin is warm and dry.   Neurological:     General: No focal deficit present.     Mental Status: Alert and oriented to person, place and time.     Cranial Nerves: Cranial nerves are intact.  EKG/LABS/Other Studies Reviewed    ECG personally reviewed by me today - ***  Risk Assessment/Calculations:   {Does this patient have ATRIAL FIBRILLATION?:[phone]}        Lab Results  Component Value Date   WBC 4.2 08/21/2022   HGB 12.5 08/21/2022   HCT 38.0 08/21/2022   MCV 93.4 08/21/2022   PLT 177 08/21/2022   Lab Results  Component Value Date   CREATININE 0.80 08/21/2022   BUN 16 08/21/2022  NA 138 08/21/2022   K 3.6 08/21/2022   CL 108 08/21/2022   CO2 24 08/21/2022   Lab Results  Component Value Date   ALT 10 12/06/2021   AST 18 12/06/2021   ALKPHOS 84 12/06/2021   BILITOT 0.6 12/06/2021   Lab Results  Component Value Date   CHOL 160 06/19/2021   HDL 62 06/19/2021   LDLCALC 78 06/19/2021   TRIG 111 06/19/2021   CHOLHDL 2.6 06/19/2021    Lab Results  Component Value Date   HGBA1C 5.5 12/17/2017    Assessment & Plan    1.  Atrial arrhythmia/palpitations: -Patient presented in the ED with complaint of new onset atrial fibrillation with subsequent EKG showing PACs and sinus rhythm -Today patient is**** -She is on Coumadin chronically for mechanical valve -  2.  Hypertension:  3.  History of mechanical AVR: -History of AVR with mechanical valve 1990 due to rheumatic fever -SBE prophylaxis discussed -Continue Coumadin as prescribed   4.  Hyperlipidemia: -Patient's most recent LDL was 78 slightly above goal of less than 70 -Continue Vytorin 10-40 mg      Disposition: Follow-up with Belva Crome III, MD or APP in *** months {Are you ordering a CV Procedure (e.g. stress test, cath, DCCV, TEE, etc)?   Press F2        :[id number]}   Medication Adjustments/Labs and Tests Ordered: Current medicines are reviewed at length with the patient today.  Concerns regarding medicines are  outlined above.   Signed, Mable Fill, Marissa Nestle, NP 08/28/2022, 8:43 PM [facility]  Note:  This document was prepared using Dragon voice recognition software and may include unintentional dictation errors.

## 2022-08-30 ENCOUNTER — Ambulatory Visit (INDEPENDENT_AMBULATORY_CARE_PROVIDER_SITE_OTHER): Payer: Medicare HMO

## 2022-08-30 ENCOUNTER — Encounter: Payer: Self-pay | Admitting: Nurse Practitioner

## 2022-08-30 ENCOUNTER — Ambulatory Visit: Payer: Medicare HMO | Attending: Nurse Practitioner | Admitting: Nurse Practitioner

## 2022-08-30 VITALS — BP 126/52 | HR 62 | Ht 66.5 in | Wt 126.6 lb

## 2022-08-30 DIAGNOSIS — I498 Other specified cardiac arrhythmias: Secondary | ICD-10-CM

## 2022-08-30 DIAGNOSIS — R002 Palpitations: Secondary | ICD-10-CM

## 2022-08-30 DIAGNOSIS — I10 Essential (primary) hypertension: Secondary | ICD-10-CM | POA: Diagnosis not present

## 2022-08-30 DIAGNOSIS — E785 Hyperlipidemia, unspecified: Secondary | ICD-10-CM

## 2022-08-30 DIAGNOSIS — Z952 Presence of prosthetic heart valve: Secondary | ICD-10-CM

## 2022-08-30 NOTE — Progress Notes (Unsigned)
ZIO XT [id number] from office inventory applied to patient.  Dr. Tamala Julian to read.

## 2022-08-30 NOTE — Patient Instructions (Signed)
Medication Instructions:  Your physician recommends that you continue on your current medications as directed. Please refer to the Current Medication list given to you today.  *If you need a refill on your cardiac medications before your next appointment, please call your pharmacy*   Lab Work: None If you have labs (blood work) drawn today and your tests are completely normal, you will receive your results only by: [place] (if you have MyChart) OR A paper copy in the mail If you have any lab test that is abnormal or we need to change your treatment, we will call you to review the results.   Follow-Up: At [facility], you and your health needs are our priority.  As part of our continuing mission to provide you with exceptional heart care, we have created designated Provider Care Teams.  These Care Teams include your primary Cardiologist (physician) and Advanced Practice Providers (APPs -  Physician Assistants and Nurse Practitioners) who all work together to provide you with the care you need, when you need it.   Your next appointment:   1 month(s)  The format for your next appointment:   In Person  Provider:   Sinclair Grooms, MD  or APP

## 2022-09-09 ENCOUNTER — Emergency Department (HOSPITAL_COMMUNITY)
Admission: EM | Admit: 2022-09-09 | Discharge: 2022-09-09 | Disposition: A | Discharge status: Home / Self Care | Payer: Medicare HMO | Attending: Emergency Medicine | Admitting: Emergency Medicine

## 2022-09-09 ENCOUNTER — Encounter (HOSPITAL_COMMUNITY): Payer: Self-pay

## 2022-09-09 DIAGNOSIS — F0390 Unspecified dementia without behavioral disturbance: Secondary | ICD-10-CM | POA: Insufficient documentation

## 2022-09-09 DIAGNOSIS — M25562 Pain in left knee: Secondary | ICD-10-CM | POA: Insufficient documentation

## 2022-09-09 DIAGNOSIS — Z79899 Other long term (current) drug therapy: Secondary | ICD-10-CM | POA: Diagnosis not present

## 2022-09-09 DIAGNOSIS — M79671 Pain in right foot: Secondary | ICD-10-CM | POA: Diagnosis present

## 2022-09-09 DIAGNOSIS — M79672 Pain in left foot: Secondary | ICD-10-CM | POA: Diagnosis not present

## 2022-09-09 DIAGNOSIS — Z8659 Personal history of other mental and behavioral disorders: Secondary | ICD-10-CM

## 2022-09-09 DIAGNOSIS — I10 Essential (primary) hypertension: Secondary | ICD-10-CM | POA: Insufficient documentation

## 2022-09-09 DIAGNOSIS — Z7901 Long term (current) use of anticoagulants: Secondary | ICD-10-CM | POA: Insufficient documentation

## 2022-09-09 DIAGNOSIS — M25561 Pain in right knee: Secondary | ICD-10-CM | POA: Insufficient documentation

## 2022-09-09 DIAGNOSIS — M25511 Pain in right shoulder: Secondary | ICD-10-CM | POA: Insufficient documentation

## 2022-09-09 DIAGNOSIS — M79604 Pain in right leg: Secondary | ICD-10-CM

## 2022-09-09 DIAGNOSIS — M25512 Pain in left shoulder: Secondary | ICD-10-CM | POA: Insufficient documentation

## 2022-09-09 NOTE — Discharge Instructions (Signed)
Continue medications as previously prescribed.  Follow-up with primary doctor if you experience additional issues.

## 2022-09-09 NOTE — ED Triage Notes (Signed)
Patient arrived via [facility] with complaints of bilateral foot, knee, and shoulder pain. Declines any recent falls or injury.

## 2022-09-09 NOTE — ED Notes (Signed)
This RN called [facility] to return pt to [facility] facility

## 2022-09-09 NOTE — ED Provider Notes (Signed)
[facility] DEPT Provider Note   CSN: [id number] Arrival date & time: 09/09/22  0034     History  No chief complaint on file.   Virginia Franklin is a 88 y.o. female.  Patient is a 88 year old female with past medical history of aortic valve replacement with anticoagulation, hypertension,, dementia.  Patient sent from her extended care facility for evaluation of bilateral foot, knee, and shoulder pain.  There is no history of falls or trauma.  Patient has very little history secondary to dementia, but denies to me she is experiencing any discomfort.  The history is provided by the patient.       Home Medications Prior to Admission medications   Medication Sig Start Date End Date Taking? Authorizing Provider  clotrimazole-betamethasone (LOTRISONE) cream Apply 1 Application topically daily. 04/27/22   Kennyth Arnold, FNP  donepezil (ARICEPT) 5 MG tablet Take 1 tablet (5 mg total) by mouth at bedtime. 07/05/22   [name], MD  escitalopram (LEXAPRO) 5 MG tablet Take 1 tablet (5 mg total) by mouth daily. 08/22/22   [name], MD  ezetimibe-simvastatin (VYTORIN) 10-40 MG tablet TAKE 1 TABLET BY MOUTH EVERY DAY 02/12/22   [name], MD  furosemide (LASIX) 20 MG tablet Take 1 tablet (20 mg total) by mouth daily. 04/18/22   [name], MD  lisinopril (ZESTRIL) 10 MG tablet TAKE 1 TABLET BY MOUTH DAILY 04/18/22   [name], MD  memantine (NAMENDA) 10 MG tablet TAKE 1 TABLET BY MOUTH TWICE A DAY 02/09/22   [name], MD  oxybutynin (DITROPAN-XL) 5 MG 24 hr tablet Take 1 tablet (5 mg total) by mouth at bedtime. 07/05/22   [name], MD  traMADol (ULTRAM) 50 MG tablet Take 2 tablets (100 mg total) by mouth every 6 (six) hours as needed. Patient not taking: Reported on 08/30/2022 12/19/20   [name], MD  warfarin (COUMADIN) 7.5 MG tablet TAKE ONE-HALF (1/2) TO ONE TABLET DAILY AS DIRECTED BY THE COUMADIN CLINIC 07/31/22   Belva Crome, MD      Allergies    Penicillins    Review of Systems   Review of Systems  All other systems reviewed and are negative.   Physical Exam Updated Vital Signs BP (!) 152/73 (BP Location: Left Arm)   Pulse 64   Temp 98.7 F (37.1 C) (Oral)   Resp 16   SpO2 96%  Physical Exam Vitals and nursing note reviewed.  Constitutional:      General: She is not in acute distress.    Appearance: She is well-developed. She is not diaphoretic.  HENT:     Head: Normocephalic and atraumatic.  Cardiovascular:     Rate and Rhythm: Normal rate and regular rhythm.     Heart sounds: No murmur heard.    No friction rub. No gallop.  Pulmonary:     Effort: Pulmonary effort is normal. No respiratory distress.     Breath sounds: Normal breath sounds. No wheezing.  Abdominal:     General: Bowel sounds are normal. There is no distension.     Palpations: Abdomen is soft.     Tenderness: There is no abdominal tenderness.  Musculoskeletal:        General: Normal range of motion.     Cervical back: Normal range of motion and neck supple.  Skin:    General: Skin is warm and dry.  Neurological:     General:  No focal deficit present.     Mental Status: She is alert.     Comments: Patient is awake and alert, but appears disoriented and confused.     ED Results / Procedures / Treatments   Labs (all labs ordered are listed, but only abnormal results are displayed) Labs Reviewed - No data to display  EKG None  Radiology No results found.  Procedures Procedures    Medications Ordered in ED Medications - No data to display  ED Course/ Medical Decision Making/ A&P  Patient sent from her extended care facility for evaluation of bilateral foot, knee, and shoulder pain.  Patient has history of dementia and adds extremely little useful information.  She denies to me that she is having any discomfort and has no complaints.  Patient arrives here with stable vital signs and is afebrile.   Her physical examination is unremarkable with the exception of baseline confusion.  She is ambulatory without difficulty and has no complaints.  I do not see indication for any work-up at this point.  Patient denies any discomfort or other issues.  She is ambulatory without difficulty.  Her feet, knees, and shoulders appear normal.  I see no swelling or edema in extremities appear well-perfused.  I feel as though patient can safely be discharged back to her extended care facility.  Final Clinical Impression(s) / ED Diagnoses Final diagnoses:  None    Rx / DC Orders ED Discharge Orders     None         Veryl Speak, MD 09/09/22 [phone]

## 2022-09-10 ENCOUNTER — Telehealth: Payer: Self-pay

## 2022-09-10 NOTE — Telephone Encounter (Signed)
Transition Care Management Follow-up Telephone Call Date of discharge and from where: 09/09/22 from [facility] for Pain in right leg How have you been since you were released from the hospital? Daughter says pt states her foot was hurting. Daughter was out of town when she received call that pt had been transported to ED. Pt is fine & doesn't recall why she called EMS.  Any questions or concerns? No  Items Reviewed: Did the pt receive and understand the discharge instructions provided? Yes  Medications obtained and verified?  N/a Other? No     Follow up appointments reviewed:  PCP Hospital f/u appt confirmed? No  Scheduled to see Dr Sarajane Jews on 09/11/22 @ 2pm. Are transportation arrangements needed? No  If their condition worsens, is the pt aware to call PCP or go to the Emergency Dept.? Yes Was the patient provided with contact information for the PCP's office or ED? Yes Was to pt encouraged to call back with questions or concerns? Yes

## 2022-09-11 ENCOUNTER — Encounter: Payer: Self-pay | Admitting: Family Medicine

## 2022-09-11 ENCOUNTER — Ambulatory Visit (INDEPENDENT_AMBULATORY_CARE_PROVIDER_SITE_OTHER): Payer: Medicare HMO | Admitting: Family Medicine

## 2022-09-11 ENCOUNTER — Ambulatory Visit: Payer: Medicare HMO | Attending: Interventional Cardiology | Admitting: *Deleted

## 2022-09-11 VITALS — BP 98/60 | HR 53 | Temp 98.0°F | Ht 66.5 in | Wt 125.0 lb

## 2022-09-11 DIAGNOSIS — E785 Hyperlipidemia, unspecified: Secondary | ICD-10-CM | POA: Diagnosis not present

## 2022-09-11 DIAGNOSIS — G300 Alzheimer's disease with early onset: Secondary | ICD-10-CM

## 2022-09-11 DIAGNOSIS — E559 Vitamin D deficiency, unspecified: Secondary | ICD-10-CM

## 2022-09-11 DIAGNOSIS — F418 Other specified anxiety disorders: Secondary | ICD-10-CM

## 2022-09-11 DIAGNOSIS — Z7901 Long term (current) use of anticoagulants: Secondary | ICD-10-CM

## 2022-09-11 DIAGNOSIS — Z5181 Encounter for therapeutic drug level monitoring: Secondary | ICD-10-CM | POA: Diagnosis not present

## 2022-09-11 DIAGNOSIS — I10 Essential (primary) hypertension: Secondary | ICD-10-CM

## 2022-09-11 DIAGNOSIS — Z8673 Personal history of transient ischemic attack (TIA), and cerebral infarction without residual deficits: Secondary | ICD-10-CM

## 2022-09-11 DIAGNOSIS — R739 Hyperglycemia, unspecified: Secondary | ICD-10-CM | POA: Diagnosis not present

## 2022-09-11 DIAGNOSIS — F02B0 Dementia in other diseases classified elsewhere, moderate, without behavioral disturbance, psychotic disturbance, mood disturbance, and anxiety: Secondary | ICD-10-CM

## 2022-09-11 DIAGNOSIS — Z952 Presence of prosthetic heart valve: Secondary | ICD-10-CM

## 2022-09-11 DIAGNOSIS — Z23 Encounter for immunization: Secondary | ICD-10-CM | POA: Diagnosis not present

## 2022-09-11 DIAGNOSIS — E538 Deficiency of other specified B group vitamins: Secondary | ICD-10-CM

## 2022-09-11 DIAGNOSIS — M159 Polyosteoarthritis, unspecified: Secondary | ICD-10-CM

## 2022-09-11 LAB — POCT INR: POC INR: 2.5

## 2022-09-11 MED ORDER — LISINOPRIL 5 MG PO TABS: 5.0000 mg | ORAL_TABLET | Freq: Every day | ORAL | 3 refills | Status: DC

## 2022-09-11 MED ORDER — LISINOPRIL 5 MG PO TABS: 5.0000 mg | ORAL_TABLET | Freq: Every day | ORAL | 0 refills | Status: DC

## 2022-09-11 NOTE — Progress Notes (Signed)
Subjective:    Patient ID: Virginia Franklin, female    DOB: [birth year], 88 y.o.   MRN: [id number]  HPI Here with her daughter to follow up on issues. Her BP has been running a little low for the past 2 months, often in the 90's over 60's. Virginia Franklin has "fallen out of bed" 3 times in the past few weeks, but apparently there were no injuries. She cannot tell us if she has felt lightheaded or not. She is not an accurate historian, so most of our history today is obtained from her daughter. Her moods have been stable. Her bowels move regularly. She has occasional urinary Incontinence, and she wears protective underpants in bed. Her appetite goes up and down, but her weight has been stable.   Review of Systems  Constitutional: Negative.   HENT: Negative.    Eyes: Negative.   Respiratory: Negative.    Cardiovascular: Negative.   Gastrointestinal: Negative.   Genitourinary:  Negative for decreased urine volume, difficulty urinating, dyspareunia, dysuria, enuresis, flank pain, frequency, hematuria, pelvic pain and urgency.  Musculoskeletal: Negative.   Skin: Negative.   Neurological: Negative.  Negative for headaches.  Psychiatric/Behavioral:  Positive for confusion. Negative for agitation.        Objective:   Physical Exam Constitutional:      General: She is not in acute distress.    Appearance: Normal appearance. She is well-developed.     Comments: Walks with a cane   HENT:     Head: Normocephalic and atraumatic.     Right Ear: External ear normal.     Left Ear: External ear normal.     Nose: Nose normal.     Mouth/Throat:     Pharynx: No oropharyngeal exudate.  Eyes:     General: No scleral icterus.    Conjunctiva/sclera: Conjunctivae normal.     Pupils: Pupils are equal, round, and reactive to light.  Neck:     Thyroid: No thyromegaly.     Vascular: No JVD.  Cardiovascular:     Rate and Rhythm: Normal rate and regular rhythm.     Heart sounds: Normal heart sounds. No  murmur heard.    No friction rub. No gallop.  Pulmonary:     Effort: Pulmonary effort is normal. No respiratory distress.     Breath sounds: Normal breath sounds. No wheezing or rales.  Chest:     Chest wall: No tenderness.  Abdominal:     General: Bowel sounds are normal. There is no distension.     Palpations: Abdomen is soft. There is no mass.     Tenderness: There is no abdominal tenderness. There is no guarding or rebound.  Musculoskeletal:        General: No tenderness. Normal range of motion.     Cervical back: Normal range of motion and neck supple.  Lymphadenopathy:     Cervical: No cervical adenopathy.  Skin:    General: Skin is warm and dry.     Findings: No erythema or rash.  Neurological:     Mental Status: She is alert and oriented to person, place, and time.     Cranial Nerves: No cranial nerve deficit.     Motor: No abnormal muscle tone.     Coordination: Coordination normal.     Deep Tendon Reflexes: Reflexes are normal and symmetric. Reflexes normal.  Psychiatric:        Behavior: Behavior normal.        Thought Content: Thought content  normal.        Judgment: Judgment normal.           Assessment & Plan:  Her HTN is stable. Her depression and anxiety are stable. Her dementia seems to have stabilized ans she will continue on Aricept and Namenda. Her recent falls are likely due to orthostasis, and since her BP is low we will decrease the Lisinopril to 5 mg daily. Get fasting labs to check lipids, etc. She is given a Prevnar 20 vaccine and a flu shot. We spent a total of ( 33  ) minutes reviewing records and discussing these issues.  Alysia Penna, MD

## 2022-09-11 NOTE — Patient Instructions (Signed)
Description   Continue taking warfarin 1/2 tablet daily except for 1 tablet on Mondays, Wednesdays and Fridays. Recheck INR in 4 weeks (per daughter request). [facility] [phone].

## 2022-09-12 LAB — BASIC METABOLIC PANEL
BUN: 29 mg/dL — ABNORMAL HIGH (ref 6–23)
CO2: 29 mEq/L (ref 19–32)
Calcium: 9.3 mg/dL (ref 8.4–10.5)
Chloride: 99 mEq/L (ref 96–112)
Creatinine, Ser: 0.71 mg/dL (ref 0.40–1.20)
GFR: 74.38 mL/min (ref >60.00)
Glucose, Bld: 90 mg/dL (ref 70–99)
Potassium: 4 mEq/L (ref 3.5–5.1)
Sodium: 135 mEq/L (ref 135–145)

## 2022-09-12 LAB — CBC WITH DIFFERENTIAL/PLATELET
Basophils Absolute: 0 10*3/uL (ref 0.0–0.1)
Basophils Relative: 0.7 % (ref 0.0–3.0)
Eosinophils Absolute: 0.1 10*3/uL (ref 0.0–0.7)
Eosinophils Relative: 2.1 % (ref 0.0–5.0)
HCT: 38.9 % (ref 36.0–46.0)
Hemoglobin: 12.8 g/dL (ref 12.0–15.0)
Lymphocytes Relative: 28.3 % (ref 12.0–46.0)
Lymphs Abs: 1.8 10*3/uL (ref 0.7–4.0)
MCHC: 32.9 g/dL (ref 30.0–36.0)
MCV: 91.7 fl (ref 78.0–100.0)
Monocytes Absolute: 0.7 10*3/uL (ref 0.1–1.0)
Monocytes Relative: 10.4 % (ref 3.0–12.0)
Neutro Abs: 3.7 10*3/uL (ref 1.4–7.7)
Neutrophils Relative %: 58.5 % (ref 43.0–77.0)
Platelets: 242 10*3/uL (ref 150.0–400.0)
RBC: 4.24 Mil/uL (ref 3.87–5.11)
RDW: 14 % (ref 11.5–15.5)
WBC: 6.3 10*3/uL (ref 4.0–10.5)

## 2022-09-12 LAB — LIPID PANEL
Cholesterol: 154 mg/dL (ref 0–200)
HDL: 64 mg/dL (ref >39.00)
LDL Cholesterol: 62 mg/dL (ref 0–99)
NonHDL: 89.59
Total CHOL/HDL Ratio: 2
Triglycerides: 136 mg/dL (ref 0.0–149.0)
VLDL: 27.2 mg/dL (ref 0.0–40.0)

## 2022-09-12 LAB — HEPATIC FUNCTION PANEL
ALT: 11 U/L (ref 0–35)
AST: 20 U/L (ref 0–37)
Albumin: 4.2 g/dL (ref 3.5–5.2)
Alkaline Phosphatase: 78 U/L (ref 39–117)
Bilirubin, Direct: 0.1 mg/dL (ref 0.0–0.3)
Total Bilirubin: 0.4 mg/dL (ref 0.2–1.2)
Total Protein: 7.5 g/dL (ref 6.0–8.3)

## 2022-09-12 LAB — TSH: TSH: 1.23 u[IU]/mL (ref 0.35–5.50)

## 2022-09-12 LAB — VITAMIN B12: Vitamin B-12: 359 pg/mL (ref 211–911)

## 2022-09-12 LAB — HEMOGLOBIN A1C: Hgb A1c MFr Bld: 5.8 % (ref 4.6–6.5)

## 2022-09-12 LAB — VITAMIN D 25 HYDROXY (VIT D DEFICIENCY, FRACTURES): VITD: 27.61 ng/mL — ABNORMAL LOW (ref 30.00–100.00)

## 2022-09-13 ENCOUNTER — Other Ambulatory Visit: Payer: Self-pay

## 2022-09-13 MED ORDER — VITAMIN D (ERGOCALCIFEROL) 1.25 MG (50000 UNIT) PO CAPS: 50000.0000 [IU] | ORAL_CAPSULE | ORAL | 3 refills | Status: DC

## 2022-10-02 ENCOUNTER — Ambulatory Visit: Payer: Medicare HMO | Admitting: Nurse Practitioner

## 2022-10-03 ENCOUNTER — Other Ambulatory Visit: Payer: Self-pay | Admitting: Family Medicine

## 2022-10-09 ENCOUNTER — Ambulatory Visit: Payer: Medicare HMO | Attending: Cardiology

## 2022-10-09 DIAGNOSIS — Z7901 Long term (current) use of anticoagulants: Secondary | ICD-10-CM

## 2022-10-09 DIAGNOSIS — Z5181 Encounter for therapeutic drug level monitoring: Secondary | ICD-10-CM

## 2022-10-09 DIAGNOSIS — Z952 Presence of prosthetic heart valve: Secondary | ICD-10-CM | POA: Diagnosis not present

## 2022-10-09 LAB — POCT INR: INR: 3 (ref 2.0–3.0)

## 2022-10-09 NOTE — Patient Instructions (Signed)
INCREASE TO 1 TABLET DAILY EXCEPT 0.5 tablet Monday, Wednesday and Friday. Recheck INR in 3 weeks (per daughter request). [facility] [phone].

## 2022-10-30 ENCOUNTER — Other Ambulatory Visit: Payer: Self-pay | Admitting: Family Medicine

## 2022-10-30 ENCOUNTER — Ambulatory Visit: Payer: Medicare HMO | Attending: Cardiology

## 2022-10-30 DIAGNOSIS — Z5181 Encounter for therapeutic drug level monitoring: Secondary | ICD-10-CM | POA: Diagnosis not present

## 2022-10-30 DIAGNOSIS — Z7901 Long term (current) use of anticoagulants: Secondary | ICD-10-CM

## 2022-10-30 DIAGNOSIS — Z952 Presence of prosthetic heart valve: Secondary | ICD-10-CM | POA: Diagnosis not present

## 2022-10-30 LAB — POCT INR: INR: 3.4 — AB (ref 2.0–3.0)

## 2022-10-30 NOTE — Patient Instructions (Signed)
HOLD TOMORROW ONLY AND THEN DECREASE TO  0.5 TABLET DAILY EXCEPT 1 tablet Monday, Wednesday and Friday. Recheck INR in 3 weeks (per daughter request). [facility] [phone].

## 2022-11-20 ENCOUNTER — Other Ambulatory Visit: Payer: Self-pay | Admitting: Family Medicine

## 2022-11-22 ENCOUNTER — Ambulatory Visit: Payer: Medicare HMO | Attending: Interventional Cardiology | Admitting: Pharmacist

## 2022-11-22 DIAGNOSIS — Z5181 Encounter for therapeutic drug level monitoring: Secondary | ICD-10-CM | POA: Diagnosis not present

## 2022-11-22 DIAGNOSIS — Z7901 Long term (current) use of anticoagulants: Secondary | ICD-10-CM

## 2022-11-22 DIAGNOSIS — Z952 Presence of prosthetic heart valve: Secondary | ICD-10-CM | POA: Diagnosis not present

## 2022-11-22 LAB — POCT INR: INR: 2.3 (ref 2.0–3.0)

## 2022-11-22 NOTE — Patient Instructions (Signed)
Description   Take 1 and 1/2 tablets tomorrow and then continue 0.5 TABLET DAILY EXCEPT 1 tablet Monday, Wednesday and Friday. Recheck INR in 3 weeks (per daughter request). [facility] [phone].

## 2022-11-22 NOTE — Progress Notes (Signed)
Going to beach. Requests 2/6 recheck

## 2022-12-16 ENCOUNTER — Other Ambulatory Visit: Payer: Self-pay | Admitting: Family Medicine

## 2022-12-18 ENCOUNTER — Ambulatory Visit: Payer: Medicare HMO | Attending: Internal Medicine | Admitting: *Deleted

## 2022-12-18 DIAGNOSIS — Z952 Presence of prosthetic heart valve: Secondary | ICD-10-CM

## 2022-12-18 DIAGNOSIS — Z7901 Long term (current) use of anticoagulants: Secondary | ICD-10-CM

## 2022-12-18 DIAGNOSIS — Z5181 Encounter for therapeutic drug level monitoring: Secondary | ICD-10-CM | POA: Diagnosis not present

## 2022-12-18 LAB — POCT INR: INR: 1.6 — AB (ref 2.0–3.0)

## 2022-12-18 MED ORDER — LISINOPRIL 5 MG PO TABS: 5.0000 mg | ORAL_TABLET | Freq: Every day | ORAL | 0 refills | Status: DC

## 2022-12-18 NOTE — Patient Instructions (Addendum)
Description   Take another 1/2 tablet of warfarin today and then continue 1/2 TABLET DAILY EXCEPT 1 tablet Monday, Wednesday and Friday. Recheck INR in 3 weeks (per daughter request). [facility] [phone].

## 2022-12-31 ENCOUNTER — Ambulatory Visit (INDEPENDENT_AMBULATORY_CARE_PROVIDER_SITE_OTHER): Payer: Medicare HMO

## 2022-12-31 ENCOUNTER — Ambulatory Visit (INDEPENDENT_AMBULATORY_CARE_PROVIDER_SITE_OTHER): Payer: Medicare HMO | Admitting: Physician Assistant

## 2022-12-31 ENCOUNTER — Encounter: Payer: Self-pay | Admitting: Physician Assistant

## 2022-12-31 ENCOUNTER — Ambulatory Visit: Payer: Medicare HMO | Attending: Cardiovascular Disease | Admitting: *Deleted

## 2022-12-31 VITALS — BP 120/58 | HR 88 | Resp 18 | Ht 66.5 in | Wt 128.0 lb

## 2022-12-31 VITALS — Ht 66.5 in | Wt 125.0 lb

## 2022-12-31 DIAGNOSIS — F0150 Vascular dementia without behavioral disturbance: Secondary | ICD-10-CM

## 2022-12-31 DIAGNOSIS — Z5181 Encounter for therapeutic drug level monitoring: Secondary | ICD-10-CM

## 2022-12-31 DIAGNOSIS — Z952 Presence of prosthetic heart valve: Secondary | ICD-10-CM | POA: Diagnosis not present

## 2022-12-31 DIAGNOSIS — F0280 Dementia in other diseases classified elsewhere without behavioral disturbance: Secondary | ICD-10-CM

## 2022-12-31 DIAGNOSIS — Z0000 Encounter for general adult medical examination without abnormal findings: Secondary | ICD-10-CM | POA: Diagnosis not present

## 2022-12-31 DIAGNOSIS — G309 Alzheimer's disease, unspecified: Secondary | ICD-10-CM | POA: Diagnosis not present

## 2022-12-31 LAB — POCT INR: INR: 1.8 — AB (ref 2.0–3.0)

## 2022-12-31 NOTE — Patient Instructions (Addendum)
Virginia Franklin , Thank you for taking time to come for your Medicare Wellness Visit. I appreciate your ongoing commitment to your health goals. Please review the following plan we discussed and let me know if I can assist you in the future.   These are the goals we discussed:  Goals       Patient Stated (pt-stated)      I would like to stay active and continuing living.         This is a list of the screening recommended for you and due dates:  Health Maintenance  Topic Date Due   COVID-19 Vaccine (5 - 2023-24 season) 01/16/2023*   Zoster (Shingles) Vaccine (1 of 2) 10/12/2023*   Medicare Annual Wellness Visit  01/01/2024   DTaP/Tdap/Td vaccine (2 - Td or Tdap) 04/28/2028   Pneumonia Vaccine  Completed   Flu Shot  Completed   HPV Vaccine  Aged Out   DEXA scan (bone density measurement)  Discontinued  *Topic was postponed. The date shown is not the original due date.    Advanced directives: Please bring a copy of your health care power of attorney and living will to the office to be added to your chart at your convenience.   Conditions/risks identified: None  Next appointment: Follow up in one year for your annual wellness visit     Preventive Care 65 Years and Older, Female Preventive care refers to lifestyle choices and visits with your health care provider that can promote health and wellness. What does preventive care include? A yearly physical exam. This is also called an annual well check. Dental exams once or twice a year. Routine eye exams. Ask your health care provider how often you should have your eyes checked. Personal lifestyle choices, including: Daily care of your teeth and gums. Regular physical activity. Eating a healthy diet. Avoiding tobacco and drug use. Limiting alcohol use. Practicing safe sex. Taking low-dose aspirin every day. Taking vitamin and mineral supplements as recommended by your health care provider. What happens during an annual well  check? The services and screenings done by your health care provider during your annual well check will depend on your age, overall health, lifestyle risk factors, and family history of disease. Counseling  Your health care provider may ask you questions about your: Alcohol use. Tobacco use. Drug use. Emotional well-being. Home and relationship well-being. Sexual activity. Eating habits. History of falls. Memory and ability to understand (cognition). Work and work Statistician. Reproductive health. Screening  You may have the following tests or measurements: Height, weight, and BMI. Blood pressure. Lipid and cholesterol levels. These may be checked every 5 years, or more frequently if you are over 40 years old. Skin check. Lung cancer screening. You may have this screening every year starting at age 59 if you have a 30-pack-year history of smoking and currently smoke or have quit within the past 15 years. Fecal occult blood test (FOBT) of the stool. You may have this test every year starting at age 57. Flexible sigmoidoscopy or colonoscopy. You may have a sigmoidoscopy every 5 years or a colonoscopy every 10 years starting at age 72. Hepatitis C blood test. Hepatitis B blood test. Sexually transmitted disease (STD) testing. Diabetes screening. This is done by checking your blood sugar (glucose) after you have not eaten for a while (fasting). You may have this done every 1-3 years. Bone density scan. This is done to screen for osteoporosis. You may have this done starting at age 52.  Mammogram. This may be done every 1-2 years. Talk to your health care provider about how often you should have regular mammograms. Talk with your health care provider about your test results, treatment options, and if necessary, the need for more tests. Vaccines  Your health care provider may recommend certain vaccines, such as: Influenza vaccine. This is recommended every year. Tetanus, diphtheria, and  acellular pertussis (Tdap, Td) vaccine. You may need a Td booster every 10 years. Zoster vaccine. You may need this after age 52. Pneumococcal 13-valent conjugate (PCV13) vaccine. One dose is recommended after age 19. Pneumococcal polysaccharide (PPSV23) vaccine. One dose is recommended after age 19. Talk to your health care provider about which screenings and vaccines you need and how often you need them. This information is not intended to replace advice given to you by your health care provider. Make sure you discuss any questions you have with your health care provider. Document Released: 11/25/2015 Document Revised: 07/18/2016 Document Reviewed: 08/30/2015 Elsevier Interactive Patient Education  2017 [place] Prevention in the Home Falls can cause injuries. They can happen to people of all ages. There are many things you can do to make your home safe and to help prevent falls. What can I do on the outside of my home? Regularly fix the edges of walkways and driveways and fix any cracks. Remove anything that might make you trip as you walk through a door, such as a raised step or threshold. Trim any bushes or trees on the path to your home. Use bright outdoor lighting. Clear any walking paths of anything that might make someone trip, such as rocks or tools. Regularly check to see if handrails are loose or broken. Make sure that both sides of any steps have handrails. Any raised decks and porches should have guardrails on the edges. Have any leaves, snow, or ice cleared regularly. Use sand or salt on walking paths during winter. Clean up any spills in your garage right away. This includes oil or grease spills. What can I do in the bathroom? Use night lights. Install grab bars by the toilet and in the tub and shower. Do not use towel bars as grab bars. Use non-skid mats or decals in the tub or shower. If you need to sit down in the shower, use a plastic, non-slip stool. Keep  the floor dry. Clean up any water that spills on the floor as soon as it happens. Remove soap buildup in the tub or shower regularly. Attach bath mats securely with double-sided non-slip rug tape. Do not have throw rugs and other things on the floor that can make you trip. What can I do in the bedroom? Use night lights. Make sure that you have a light by your bed that is easy to reach. Do not use any sheets or blankets that are too big for your bed. They should not hang down onto the floor. Have a firm chair that has side arms. You can use this for support while you get dressed. Do not have throw rugs and other things on the floor that can make you trip. What can I do in the kitchen? Clean up any spills right away. Avoid walking on wet floors. Keep items that you use a lot in easy-to-reach places. If you need to reach something above you, use a strong step stool that has a grab bar. Keep electrical cords out of the way. Do not use floor polish or wax that makes floors slippery. If  you must use wax, use non-skid floor wax. Do not have throw rugs and other things on the floor that can make you trip. What can I do with my stairs? Do not leave any items on the stairs. Make sure that there are handrails on both sides of the stairs and use them. Fix handrails that are broken or loose. Make sure that handrails are as long as the stairways. Check any carpeting to make sure that it is firmly attached to the stairs. Fix any carpet that is loose or worn. Avoid having throw rugs at the top or bottom of the stairs. If you do have throw rugs, attach them to the floor with carpet tape. Make sure that you have a light switch at the top of the stairs and the bottom of the stairs. If you do not have them, ask someone to add them for you. What else can I do to help prevent falls? Wear shoes that: Do not have high heels. Have rubber bottoms. Are comfortable and fit you well. Are closed at the toe. Do not  wear sandals. If you use a stepladder: Make sure that it is fully opened. Do not climb a closed stepladder. Make sure that both sides of the stepladder are locked into place. Ask someone to hold it for you, if possible. Clearly mark and make sure that you can see: Any grab bars or handrails. First and last steps. Where the edge of each step is. Use tools that help you move around (mobility aids) if they are needed. These include: Canes. Walkers. Scooters. Crutches. Turn on the lights when you go into a dark area. Replace any light bulbs as soon as they burn out. Set up your furniture so you have a clear path. Avoid moving your furniture around. If any of your floors are uneven, fix them. If there are any pets around you, be aware of where they are. Review your medicines with your doctor. Some medicines can make you feel dizzy. This can increase your chance of falling. Ask your doctor what other things that you can do to help prevent falls. This information is not intended to replace advice given to you by your health care provider. Make sure you discuss any questions you have with your health care provider. Document Released: 08/25/2009 Document Revised: 04/05/2016 Document Reviewed: 12/03/2014 Elsevier Interactive Patient Education  2017 [employer].

## 2022-12-31 NOTE — Progress Notes (Signed)
Assessment/Plan:   Dementia likely due to Alzheimer's Disease and vascular etiology   Virginia Franklin is a very pleasant 89 y.o. RH female seen today in follow up for memory loss. Patient is currently on memantine 10 mg bid and donepezil 5 mg daily (per PCP) . Memory is slightly worse, but daughter agrees that increasing donepezil dose's risks may outweigh the benefits. She needs more assistance with her ADL'S than prior, but she has good family support and HHN 3 times a week to help her with basic needs, meals and medications. Mood is well controlled.     Follow up in 6  months. Continue Memantine 10 mg twice daily. Side effects were discussed  Continue donepezil 5 mg daily. Side effects were discussed  Continue OT and ST   Agree with HHN and close supervision for safety.  Continue to control mood as per PCP   Subjective:    This patient is accompanied in the office by her daughter  who supplements the history.  Previous records as well as any outside records available were reviewed prior to todays visit. Patient was last seen on 07/03/22 .   Any changes in memory since last visit?  "Doing well today"-daughter says.  Short-term memory and long-term memory are affected.  She does not like to participate much in activities except spending time with Mikki Santee, who has been a good support at night, and she enjoys his company.  During the day, the patient has HHN/sitters, to help her with lunch and medicines. She still forgets the sitter's name (has been there for 3 years)  repeats oneself?  Endorsed by her daughter ( she forgets within mins and asks again). She does ST at independent living  Disoriented when walking into a room?  She thinks her home is temporary, because she is with Mikki Santee at his place sometimes  Leaving objects in unusual places?   She likes to collect napkins, plastic cutlery, potato chips in the fridge.  She has lost her partials and have not been replaced "because she is  going to lose them again".  Wandering behavior? Recently  she wondered off to [employer] again. Any personality changes since last visit?  Sometimes she gets frustrated (gets mad that her STM is not as before)  Any worsening depression?:  denies   Hallucinations or paranoia?  denies   Seizures?    denies    Any sleep changes?  Denies vivid dreams, REM behavior or sleepwalking   Sleep apnea?   denies   Any hygiene concerns?  She spends a long time in the shower because she washes her hair and washes it again (she forgets that she did)     Independent of bathing and dressing?  Endorsed  Does the patient needs help with medications? Daughter and sitter are in charge   Who is in charge of the finances? Daughter is in charge     Any changes in appetite?  Denies. she does not remember if she ate or not. HHN makes her lunch, Mikki Santee and her eat together. She pushes more water  Patient have trouble swallowing?she chokes on a pickle today, first time  Does the patient cook?  Any kitchen accidents such as leaving the stove on? Patient denies   Any headaches?   denies   Chronic back pain  denies   Ambulates with difficulty?    She has bilateral knee arthritis, which limits her mobility.  She uses a walker to go places, but she forgets  to use it at home.She does OT with [facility] Recent falls or head injuries? denies     Unilateral weakness, numbness or tingling? denies   Any tremors?  denies   Any anosmia?  Patient denies   Any incontinence of urine?  Endorsed, controlled with Ditropan Any bowel dysfunction?   denies      Patient lives  in independent living.  Sitter  has been attending her for safety 3 times a week  Does the patient drive?no longer drives     History on Initial Assessment 01/28/2018: This is an 89 year old right-handed woman with a history of aortic valve replacement on anticoagulation with Coumadin, hypertension, cervical cancer, presenting after hospitalization on 12/16/17 for altered mental  status.  Records were reviewed. Patient had reported that a few days prior to admission, she had an episode where she was unable to recall different things in the house. This lasted for a few hours then resolved. On 12/16/17, she was at the Coumadin clinic having her INR checked, when she had an episode where she was reported to zone out and was non-verbal. She did not recall the episode. She recalls having her finger pricked, she denies feeling dizzy or any other warning symptoms. Per notes it lasted 45 seconds, but her daughter reports she was still not back to baseline when she arrived in the hospital. She could not say what year it was or who the president is. Her daughter had reported that over the past 2 years, she has had around 5 episodes of confusion and inability to recall events. She saw a neurologist in [place], per daughter workup showed electrolyte abnormalities and prior strokes. These usually occur upon awakening, it takes her 20-30 minutes to get her bearings. Her daughter has not witnessed them, but her son had reported that she was not quite herself. She drank her electrolyte drink and within 20 minutes was back to normal. At [facility], she had an MRI brain without contrast which I personally reviewed, no acute changes, there was moderate chronic microvascular disease and volume loss. Her wake and sleep EEG was within normal limits. It was felt that she had some underlying dementia and Aricept was recommended, she has not started this.   She moved to [facility] in October 2018 and has been living in a retirement community at [facility]. Her daughter reported progressive memory problems over the past 5-7 years, worse in the past couple of years. She has gotten lost driving a couple of times and has stopped driving since moving to [place]. She feels her memory is not good, she is forgetting quite a bit. Her daughter took over her finances in November 2018. She manages her own medications, although her daughter  reports that one time she checked and a couple of things were off with her medication, she missed an entire day of her pillbox. Her daughter comes twice a week to visit.    She has occasional vertigo from inner ear issues. She has occasional trouble swallowing, neck and back pain, urinary incontinence, constipation. She has occasional tremor on her right hand when painting. She denies any headaches, dizziness, diplopia, dysarthria, focal numbness/tingling/weakness, anosmia. She still feels that her thinking is not straight. She is still forgetting things and repeating conversations. Se denies any olfactory/gustatory hallucinations, deja vu, rising epigastric sensation, focal numbness/tingling/weakness, myoclonic jerks. She had a normal birth and early development.  There is no history of febrile convulsions, CNS infections such as meningitis/encephalitis, significant traumatic brain injury, neurosurgical  procedures, or family history of seizures.   Diagnostic Data: Neuropsychological evaluation in 04/2018 showed mild dementia, most likely due to Alzheimer's disease, but cannot rule out mixed dementia (Alzheimer's and vascular dementia). MRI brain without contrast done 12/2017 no acute changes, there was moderate diffuse volume loss and moderate chronic microvascular disease.    PREVIOUS MEDICATIONS:   CURRENT MEDICATIONS:  Outpatient Encounter Medications as of 12/31/2022  Medication Sig   clotrimazole-betamethasone (LOTRISONE) cream Apply 1 Application topically daily.   donepezil (ARICEPT) 5 MG tablet Take 1 tablet (5 mg total) by mouth at bedtime.   escitalopram (LEXAPRO) 5 MG tablet TAKE 1 TABLET DAILY   ezetimibe-simvastatin (VYTORIN) 10-40 MG tablet TAKE 1 TABLET BY MOUTH EVERY DAY   furosemide (LASIX) 20 MG tablet TAKE 1 TABLET BY MOUTH EVERY DAY   lisinopril (ZESTRIL) 5 MG tablet Take 1 tablet (5 mg total) by mouth daily.   memantine (NAMENDA) 10 MG tablet TAKE 1 TABLET BY MOUTH TWICE A DAY    oxybutynin (DITROPAN-XL) 5 MG 24 hr tablet Take 1 tablet (5 mg total) by mouth at bedtime.   Vitamin D, Ergocalciferol, (DRISDOL) 1.25 MG (50000 UNIT) CAPS capsule Take 1 capsule (50,000 Units total) by mouth every 7 (seven) days.   warfarin (COUMADIN) 7.5 MG tablet TAKE ONE-HALF (1/2) TO ONE TABLET DAILY AS DIRECTED BY THE COUMADIN CLINIC   No facility-administered encounter medications on file as of 12/31/2022.       04/06/2020   10:00 AM  MMSE - Mini Mental State Exam  Orientation to time 1  Orientation to Place 1  Registration 3  Attention/ Calculation 5  Recall 0  Language- name 2 objects 2  Language- repeat 1  Language- follow 3 step command 3  Language- read & follow direction 1  Write a sentence 1  Copy design 1  Total score 19      01/28/2018    1:00 PM  Montreal Cognitive Assessment   Visuospatial/ Executive (0/5) 5  Naming (0/3) 3  Attention: Read list of digits (0/2) 1  Attention: Read list of letters (0/1) 1  Attention: Serial 7 subtraction starting at 100 (0/3) 3  Language: Repeat phrase (0/2) 0  Language : Fluency (0/1) 0  Abstraction (0/2) 1  Delayed Recall (0/5) 2  Orientation (0/6) 4  Total 20    Objective:     PHYSICAL EXAMINATION:    VITALS:   Vitals:   12/31/22 1510  BP: (!) 120/58  Pulse: 88  Resp: 18  SpO2: 100%  Weight: 128 lb (58.1 kg)  Height: 5' 6.5" (1.689 m)    GEN:  The patient appears stated age and is in NAD. HEENT:  Normocephalic, atraumatic.   Neurological examination:  General: NAD, well-groomed, appears stated age. Orientation: The patient is alert. Oriented to person, not to place or date Cranial nerves: There is good facial symmetry.The speech is fluent and clear, repetitive. No aphasia or dysarthria. Fund of knowledge is reduced. Recent and remote memory are impaired. Attention and concentration are reduced.  Able to name objects and repeat phrases.  Hearing is intact to conversational tone.    Sensation:  Sensation is intact to light touch throughout Motor: Strength is at least antigravity x4. DTR's 2/4 in UE/LE     Movement examination: Tone: There is normal tone in the UE/LE Abnormal movements:  no tremor.  No myoclonus.  No asterixis.   Coordination:  There is mild decremation with RAM's. Normal finger to nose  Gait and Station:  The patient has some difficulty arising out of a deep-seated chair without the use of the hands. The patient's stride length is short, occasionally she shuffles.Uses the walker to ambulate  Gait is cautious and narrow.    Thank you for allowing [place] the opportunity to participate in the care of this nice patient. Please do not hesitate to contact us for any questions or concerns.   Total time spent on today's visit was 40 minutes dedicated to this patient today, preparing to see patient, examining the patient, ordering tests and/or medications and counseling the patient, documenting clinical information in the EHR or other health record, independently interpreting results and communicating results to the patient/family, discussing treatment and goals, answering patient's questions and coordinating care.  Cc:  [name], MD  Sharene Butters 12/31/2022 5:49 PM

## 2022-12-31 NOTE — Patient Instructions (Signed)
It was a pleasure to see you today at our office.   Recommendations:  Follow up in 6  months Continue Memantine 10 mg twice daily.Side effects were discussed  Continue Donepezil 5 mg daily  Mood control as per primary doctor   Whom to call:  Memory  decline, memory medications: Call our office [phone]   For psychiatric meds, mood meds: Please have your primary care physician manage these medications.   Counseling regarding caregiver distress, including caregiver depression, anxiety and issues regarding community resources, adult day care programs, adult living facilities, or memory care questions:   Feel free to contact Gratz, Social Worker at [phone]   For assessment of decision of mental capacity and competency:  Call Dr. Anthoney Harada, geriatric psychiatrist at [phone]  For guidance in geriatric dementia issues please call [facility] [phone]  For guidance regarding [facility] and if placement were needed at the facility, contact Arnell Asal, Social Worker tel: [phone]  If you have any severe symptoms of a stroke, or other severe issues such as confusion,severe chills or fever, etc call 911 or go to the ER as you may need to be evaluated further   Feel free to visit Facebook page " [email or web address]" for tips of how to care for people with memory problems.      RECOMMENDATIONS FOR ALL PATIENTS WITH MEMORY PROBLEMS: 1. Continue to exercise (Recommend 30 minutes of walking everyday, or 3 hours every week) 2. Increase social interactions - continue going to [place] and enjoy social gatherings with friends and family 3. Eat healthy, avoid fried foods and eat more fruits and vegetables 4. Maintain adequate blood pressure, blood sugar, and blood cholesterol level. Reducing the risk of stroke and cardiovascular disease also helps promoting better memory. 5. Avoid stressful situations. Live a simple life and avoid  aggravations. Organize your time and prepare for the next day in anticipation. 6. Sleep well, avoid any interruptions of sleep and avoid any distractions in the bedroom that may interfere with adequate sleep quality 7. Avoid sugar, avoid sweets as there is a strong link between excessive sugar intake, diabetes, and cognitive impairment We discussed the Mediterranean diet, which has been shown to help patients reduce the risk of progressive memory disorders and reduces cardiovascular risk. This includes eating fish, eat fruits and green leafy vegetables, nuts like almonds and hazelnuts, walnuts, and also use olive oil. Avoid fast foods and fried foods as much as possible. Avoid sweets and sugar as sugar use has been linked to worsening of memory function.  There is always a concern of gradual progression of memory problems. If this is the case, then we may need to adjust level of care according to patient needs. Support, both to the patient and caregiver, should then be put into place.    The [employer] Association is here all day, every day for people facing Alzheimer's disease through our free 24/7 Helpline: [phone]. The Helpline provides reliable information and support to all those who need assistance, such as individuals living with memory loss, Alzheimer's or other dementia, caregivers, health care professionals and the public.  Our highly trained and knowledgeable staff can help you with: Understanding memory loss, dementia and Alzheimer's  Medications and other treatment options  General information about aging and brain health  Skills to provide quality care and to find the best care from professionals  Legal, financial and living-arrangement decisions Our Helpline also features: Confidential care consultation provided by [facility] level clinicians  who can help with decision-making support, crisis assistance and education on issues families face every day  Help in a caller's preferred  language using our translation service that features more than 200 languages and dialects  Referrals to local community programs, services and ongoing support     FALL PRECAUTIONS: Be cautious when walking. Scan the area for obstacles that may increase the risk of trips and falls. When getting up in the mornings, sit up at the edge of the bed for a few minutes before getting out of bed. Consider elevating the bed at the head end to avoid drop of blood pressure when getting up. Walk always in a well-lit room (use night lights in the walls). Avoid area rugs or power cords from appliances in the middle of the walkways. Use a walker or a cane if necessary and consider physical therapy for balance exercise. Get your eyesight checked regularly.  FINANCIAL OVERSIGHT: Supervision, especially oversight when making financial decisions or transactions is also recommended.  HOME SAFETY: Consider the safety of the kitchen when operating appliances like stoves, microwave oven, and blender. Consider having supervision and share cooking responsibilities until no longer able to participate in those. Accidents with firearms and other hazards in the house should be identified and addressed as well.   ABILITY TO BE LEFT ALONE: If patient is unable to contact 911 operator, consider using LifeLine, or when the need is there, arrange for someone to stay with patients. Smoking is a fire hazard, consider supervision or cessation. Risk of wandering should be assessed by caregiver and if detected at any point, supervision and safe proof recommendations should be instituted.  MEDICATION SUPERVISION: Inability to self-administer medication needs to be constantly addressed. Implement a mechanism to ensure safe administration of the medications.   DRIVING: Regarding driving, in patients with progressive memory problems, driving will be impaired. We advise to have someone else do the driving if trouble finding directions or if  minor accidents are reported. Independent driving assessment is available to determine safety of driving.   If you are interested in the driving assessment, you can contact the following:  [employer] in [place]  [place] [phone] or [phone]      [place] refers to food and lifestyle choices that are based on the traditions of countries located on the [employer]. This way of eating has been shown to help prevent certain conditions and improve outcomes for people who have chronic diseases, like kidney disease and heart disease. What are tips for following this plan? Lifestyle  Cook and eat meals together with your family, when possible. Drink enough fluid to keep your urine clear or pale yellow. Be physically active every day. This includes: Aerobic exercise like running or swimming. Leisure activities like gardening, walking, or housework. Get 7-8 hours of sleep each night. If recommended by your health care provider, drink red wine in moderation. This means 1 glass a day for nonpregnant women and 2 glasses a day for men. A glass of wine equals 5 oz (150 mL). Reading food labels  Check the serving size of packaged foods. For foods such as rice and pasta, the serving size refers to the amount of cooked product, not dry. Check the total fat in packaged foods. Avoid foods that have saturated fat or trans fats. Check the ingredients list for added sugars, such as corn syrup. Shopping  At the grocery  store, buy most of your food from the areas near the walls of the store. This includes: Fresh fruits and vegetables (produce). Grains, beans, nuts, and seeds. Some of these may be available in unpackaged forms or large amounts (in bulk). Fresh seafood. Poultry and eggs. Low-fat dairy products. Buy whole ingredients instead of  prepackaged foods. Buy fresh fruits and vegetables in-season from local farmers markets. Buy frozen fruits and vegetables in resealable bags. If you do not have access to quality fresh seafood, buy precooked frozen shrimp or canned fish, such as tuna, salmon, or sardines. Buy small amounts of raw or cooked vegetables, salads, or olives from the deli or salad bar at your store. Stock your pantry so you always have certain foods on hand, such as olive oil, canned tuna, canned tomatoes, rice, pasta, and beans. Cooking  Cook foods with extra-virgin olive oil instead of using butter or other vegetable oils. Have meat as a side dish, and have vegetables or grains as your main dish. This means having meat in small portions or adding small amounts of meat to foods like pasta or stew. Use beans or vegetables instead of meat in common dishes like chili or lasagna. Experiment with different cooking methods. Try roasting or broiling vegetables instead of steaming or sauteing them. Add frozen vegetables to soups, stews, pasta, or rice. Add nuts or seeds for added healthy fat at each meal. You can add these to yogurt, salads, or vegetable dishes. Marinate fish or vegetables using olive oil, lemon juice, garlic, and fresh herbs. Meal planning  Plan to eat 1 vegetarian meal one day each week. Try to work up to 2 vegetarian meals, if possible. Eat seafood 2 or more times a week. Have healthy snacks readily available, such as: Vegetable sticks with hummus. Greek yogurt. Fruit and nut trail mix. Eat balanced meals throughout the week. This includes: Fruit: 2-3 servings a day Vegetables: 4-5 servings a day Low-fat dairy: 2 servings a day Fish, poultry, or lean meat: 1 serving a day Beans and legumes: 2 or more servings a week Nuts and seeds: 1-2 servings a day Whole grains: 6-8 servings a day Extra-virgin olive oil: 3-4 servings a day Limit red meat and sweets to only a few servings a month What are my  food choices? Mediterranean diet Recommended Grains: Whole-grain pasta. Brown rice. Bulgar wheat. Polenta. Couscous. Whole-wheat bread. [place]. Vegetables: Artichokes. Beets. Broccoli. Cabbage. Carrots. Eggplant. Green beans. Chard. Kale. Spinach. Onions. Leeks. Peas. Squash. Tomatoes. Peppers. Radishes. Fruits: Apples. Apricots. Avocado. Berries. Bananas. Cherries. Dates. Figs. Grapes. Lemons. Melon. Oranges. Peaches. Plums. Pomegranate. Meats and other protein foods: Beans. Almonds. Sunflower seeds. Pine nuts. Peanuts. [place]. Salmon. Scallops. Shrimp. [place]. Tilapia. Clams. Oysters. Eggs. Dairy: Low-fat milk. Cheese. Greek yogurt. Beverages: Water. Red wine. Herbal tea. Fats and oils: Extra virgin olive oil. Avocado oil. Grape seed oil. Sweets and desserts: [place] yogurt with honey. Baked apples. Poached pears. Trail mix. Seasoning and other foods: Basil. Cilantro. Coriander. Cumin. Mint. Parsley. Sage. Rosemary. Tarragon. Garlic. Oregano. Thyme. Pepper. Balsalmic vinegar. Tahini. Hummus. Tomato sauce. Olives. Mushrooms. Limit these Grains: Prepackaged pasta or rice dishes. Prepackaged cereal with added sugar. Vegetables: Deep fried potatoes (french fries). Fruits: Fruit canned in syrup. Meats and other protein foods: Beef. Pork. Lamb. Poultry with skin. Hot dogs. [place] Salines. Dairy: Ice cream. Sour cream. Whole milk. Beverages: Juice. Sugar-sweetened soft drinks. Beer. Liquor and spirits. Fats and oils: Butter. [place] oil. Vegetable oil. Beef fat (tallow). Lard. Sweets and desserts: Cookies. Cakes. Pies. Candy.  Seasoning and other foods: Mayonnaise. Premade sauces and marinades. The items listed may not be a complete list. Talk with your dietitian about what dietary choices are right for you. Summary The Mediterranean diet includes both food and lifestyle choices. Eat a variety of fresh fruits and vegetables, beans, nuts, seeds, and whole grains. Limit the amount of red meat and sweets  that you eat. Talk with your health care provider about whether it is safe for you to drink red wine in moderation. This means 1 glass a day for nonpregnant women and 2 glasses a day for men. A glass of wine equals 5 oz (150 mL). This information is not intended to replace advice given to you by your health care provider. Make sure you discuss any questions you have with your health care provider. Document Released: 06/21/2016 Document Revised: 07/24/2016 Document Reviewed: 06/21/2016 Elsevier Interactive Patient Education  2017 [employer].

## 2022-12-31 NOTE — Patient Instructions (Signed)
Take another 1/2 tablet of warfarin today and then increase dose to 1 tablet daily except 1/2 tablet on Sundays, Tuesdays and Thursdays. Recheck INR in 2 weeks. [facility] [phone].

## 2022-12-31 NOTE — Progress Notes (Signed)
Subjective:   Virginia Franklin is a 89 y.o. female who presents for Medicare Annual (Subsequent) preventive examination.  Review of Systems    Virtual Visit via Telephone Note  I connected with  Lacreshia Moradi Gonterman on 12/31/22 at 10:30 AM EST by telephone and verified that I am speaking with the correct person using two identifiers.  Location: Patient: Home Provider: Office Persons participating in the virtual visit: patient/Nurse Health Advisor   I discussed the limitations, risks, security and privacy concerns of performing an evaluation and management service by telephone and the availability of in person appointments. The patient expressed understanding and agreed to proceed.  Interactive audio and video telecommunications were attempted between this nurse and patient, however failed, due to patient having technical difficulties OR patient did not have access to video capability.  We continued and completed visit with audio only.  Some vital signs may be absent or patient reported.   [name], LPN  Cardiac Risk Factors include: advanced age (>51mn, >>66women);dyslipidemia;hypertension     Objective:    Today's Vitals   12/31/22 1011  Weight: 125 lb (56.7 kg)  Height: 5' 6.5" (1.689 m)   Body mass index is 19.87 kg/m.     12/31/2022   10:27 AM 09/09/2022   12:41 AM 08/21/2022    8:34 AM 07/03/2022    2:03 PM 12/28/2021   10:31 AM 12/19/2021    3:15 PM 05/31/2021   10:36 AM  Advanced Directives  Does Patient Have a Medical Advance Directive? Yes No No Yes Yes Yes Yes  Type of AParamedicof ANesbittLiving will   Healthcare Power of AVinaLiving will  Living will;Out of facility DNR (pink MOST or yellow form)  Does patient want to make changes to medical advance directive?     No - Patient declined    Copy of [place]? No - copy requested    No - copy requested    Would  patient like information on creating a medical advance directive?  No - Patient declined         Current Medications (verified) Outpatient Encounter Medications as of 12/31/2022  Medication Sig   Vitamin D, Ergocalciferol, (DRISDOL) 1.25 MG (50000 UNIT) CAPS capsule Take 1 capsule (50,000 Units total) by mouth every 7 (seven) days.   clotrimazole-betamethasone (LOTRISONE) cream Apply 1 Application topically daily.   donepezil (ARICEPT) 5 MG tablet Take 1 tablet (5 mg total) by mouth at bedtime.   escitalopram (LEXAPRO) 5 MG tablet TAKE 1 TABLET DAILY   ezetimibe-simvastatin (VYTORIN) 10-40 MG tablet TAKE 1 TABLET BY MOUTH EVERY DAY   furosemide (LASIX) 20 MG tablet TAKE 1 TABLET BY MOUTH EVERY DAY   lisinopril (ZESTRIL) 5 MG tablet Take 1 tablet (5 mg total) by mouth daily.   memantine (NAMENDA) 10 MG tablet TAKE 1 TABLET BY MOUTH TWICE A DAY   oxybutynin (DITROPAN-XL) 5 MG 24 hr tablet Take 1 tablet (5 mg total) by mouth at bedtime.   warfarin (COUMADIN) 7.5 MG tablet TAKE ONE-HALF (1/2) TO ONE TABLET DAILY AS DIRECTED BY THE COUMADIN CLINIC   No facility-administered encounter medications on file as of 12/31/2022.    Allergies (verified) Penicillins   History: Past Medical History:  Diagnosis Date   [place]'s [place]   Arthritis    OA   Body mass index (BMI) of 22.0-22.9 in adult    [place]ervical cancer ([place]  had hysterctomy for   H/O osteoporosis    H/O: CVA (cerebrovascular accident) 2012   , HEMORRHAGIC 09-04-2019 ALSO   H/O: osteoarthritis    Hammertoe of left foot    History of urinary frequency    Hx of diverticulitis of colon    Hypertension    Lumbar stress fracture lL3, 01-2020   Past Surgical History:  Procedure Laterality Date   ABDOMINAL HYSTERECTOMY     CATARACT EXTRACTION Bilateral    HAMMER TOE SURGERY Left 07/29/2020   Procedure: HAMMER TOE REPAIR 2ND TOE LEFT;  Surgeon: [name], DPM;  Location: [facility];  Service:  Podiatry;  Laterality: Left;   INCISION AND DRAINAGE Bilateral 07/29/2020   Procedure: EXCISION OF WOUND OF LEFT SECOND TOE; RELEASE OF TENDONS LEFT THIRD TOE, RIGHT THIRD TOE;  Surgeon: [name], DPM;  Location: [facility];  Service: Podiatry;  Laterality: Bilateral;   mechanical heart valve  1990   st jude done in [place]     Family History  Problem Relation Age of Onset   Kidney disease Mother        MALIGNANT NEOPLASM   Arthritis Father    Heart Problems Father    Lung disease Brother    Heart disease Neg Hx    Social History   Socioeconomic History   Marital status: Widowed    Spouse name: Not on file   Number of children: 2   Years of education: 14   Highest education level: Not on file  Occupational History   Not on file  Tobacco Use   Smoking status: Never   Smokeless tobacco: Never  Vaping Use   Vaping Use: Never used  Substance and Sexual Activity   Alcohol use: Yes    Comment: SOCIALLY   Drug use: No   Sexual activity: Not on file    Comment: WIDOW  Other Topics Concern   Not on file  Social History Narrative   Lives alone in a retirement home.  Has 2 children.  Retired Haematologist.  Education: Probation officer school.    Right handed   Caffeine rarely   Social Determinants of Health   Financial Resource Strain: Low Risk  (12/31/2022)   Overall Financial Resource Strain (CARDIA)    Difficulty of Paying Living Expenses: Not hard at all  Food Insecurity: No Food Insecurity (12/31/2022)   Hunger Vital Sign    Worried About Running Out of Food in the Last Year: Never true    Ran Out of Food in the Last Year: Never true  Transportation Needs: No Transportation Needs (12/31/2022)   PRAPARE - Hydrologist (Medical): No    Lack of Transportation (Non-Medical): No  Physical Activity: Inactive (12/31/2022)   Exercise Vital Sign    Days of Exercise per Week: 0 days    Minutes of  Exercise per Session: 0 min  Stress: No Stress Concern Present (12/31/2022)   [place]    Feeling of Stress : Not at all  Social Connections: Moderately Isolated (12/31/2022)   Social Connection and Isolation Panel [NHANES]    Frequency of Communication with Friends and Family: More than three times a week    Frequency of Social Gatherings with Friends and Family: More than three times a week    Attends Religious Services: Never    Marine scientist or Organizations: No  Attends Archivist Meetings: More than 4 times per year    Marital Status: Widowed    Tobacco Counseling Counseling given: Not Answered   Clinical Intake:  Pre-visit preparation completed: Yes  Pain : No/denies pain     BMI - recorded: 19.87 Nutritional Status: BMI of 19-24  Normal Nutritional Risks: None Diabetes: No  How often do you need to have someone help you when you read instructions, pamphlets, or other written materials from your doctor or pharmacy?: 1 - Never  Diabetic?  No  Interpreter Needed?: No  Information entered by :: Rolene Arbour LPN   Activities of Daily Living    12/31/2022   10:20 AM  In your present state of health, do you have any difficulty performing the following activities:  Hearing? 0  Vision? 0  Difficulty concentrating or making decisions? 0  Walking or climbing stairs? 1  Comment Uses wallker  Dressing or bathing? 0  Doing errands, shopping? 1  Comment [place] and eating ? N  Using the Toilet? N  In the past six months, have you accidently leaked urine? Y  Comment Wears pads at night  Do you have problems with loss of bowel control? N  Managing your Medications? Y  Comment Nurse and daughter assist  Managing your Finances? Y  Comment Daughter assist  Housekeeping or managing your Housekeeping? Y  Comment Aide assist    Patient Care Team: [name], MD as PCP - General (Family Medicine) Belva Crome, MD (Inactive) as PCP - Cardiology (Cardiology) Cameron Sprang, MD as Consulting Physician (Neurology) Elease Hashimoto (Neurology)  Indicate any recent Medical Services you may have received from other than [facility] providers in the past year (date may be approximate).     Assessment:   This is a routine wellness examination for [place].  Hearing/Vision screen Hearing Screening - Comments:: Denies hearing difficulties   Vision Screening - Comments:: No vision difficulty  Dietary issues and exercise activities discussed: Exercise limited by: None identified   Goals Addressed               This Visit's Progress     Patient Stated (pt-stated)        I would like to stay active and continuing living.        Depression Screen    12/31/2022   10:18 AM 09/11/2022    2:10 PM 04/27/2022    1:49 PM 12/28/2021   10:25 AM 12/06/2021   12:02 PM 01/18/2021    1:25 PM 04/06/2020   10:14 AM  PHQ 2/9 Scores  PHQ - 2 Score 0 0 1 0 0 0 0  PHQ- 9 Score  6 9 0 10      Fall Risk    12/31/2022   10:26 AM 09/11/2022    2:10 PM 07/03/2022    2:03 PM 12/28/2021   10:30 AM 12/19/2021    3:15 PM  Fall Risk   Falls in the past year? 0 1 0 0 0  Number falls in past yr: 0 1 0 0 0  Injury with Fall? 0 1 0 0 0  Risk for fall due to : No Fall Risks History of fall(s)  No Fall Risks   Follow up Falls prevention discussed Falls evaluation completed       FALL RISK PREVENTION PERTAINING TO THE HOME:  Any stairs in or around the home? Yes  If so, are there any without  handrails? No  Home free of loose throw rugs in walkways, pet beds, electrical cords, etc? Yes  Adequate lighting in your home to reduce risk of falls? Yes   ASSISTIVE DEVICES UTILIZED TO PREVENT FALLS:  Life alert? No  Use of a cane, walker or w/c? Yes  Grab bars in the bathroom? Yes  Shower chair or bench in shower? Yes  Elevated toilet seat or a handicapped  toilet? Yes   TIMED UP AND GO:  Was the test performed? No . Audio Visit   Cognitive Function:    04/06/2020   10:00 AM  MMSE - Mini Mental State Exam  Orientation to time 1  Orientation to Place 1  Registration 3  Attention/ Calculation 5  Recall 0  Language- name 2 objects 2  Language- repeat 1  Language- follow 3 step command 3  Language- read & follow direction 1  Write a sentence 1  Copy design 1  Total score 19      01/28/2018    1:00 PM  Montreal Cognitive Assessment   Visuospatial/ Executive (0/5) 5  Naming (0/3) 3  Attention: Read list of digits (0/2) 1  Attention: Read list of letters (0/1) 1  Attention: Serial 7 subtraction starting at 100 (0/3) 3  Language: Repeat phrase (0/2) 0  Language : Fluency (0/1) 0  Abstraction (0/2) 1  Delayed Recall (0/5) 2  Orientation (0/6) 4  Total 20      12/31/2022   10:27 AM 12/28/2021   10:31 AM  6CIT Screen  What Year? 0 points 4 points  What month? 0 points 0 points  What time? 0 points 3 points  Count back from 20 0 points 2 points  Months in reverse 4 points 4 points  Repeat phrase 10 points 4 points  Total Score 14 points 17 points    Immunizations Immunization History  Administered Date(s) Administered   Fluad Quad(high Dose 65+) 07/31/2019, 09/11/2022   Influenza, High Dose Seasonal PF 07/25/2018   Influenza-Unspecified 09/06/2017, 07/27/2020, 11/10/2021   Moderna Covid-19 Vaccine Bivalent Booster 62yr & up 02/21/2021, 09/19/2021   Moderna Sars-Covid-2 Vaccination 11/30/2019, 12/28/2019   PNEUMOCOCCAL CONJUGATE-20 09/11/2022   Tdap 04/28/2018   Zoster, Unspecified 09/11/2012    TDAP status: Up to date  Flu Vaccine status: Up to date  Pneumococcal vaccine status: Up to date  Covid-19 vaccine status: Completed vaccines  Qualifies for Shingles Vaccine? Yes   Zostavax completed No   Shingrix Completed?: No.    Education has been provided regarding the importance of this vaccine. Patient has  been advised to call insurance company to determine out of pocket expense if they have not yet received this vaccine. Advised may also receive vaccine at local pharmacy or Health Dept. Verbalized acceptance and understanding.  Screening Tests Health Maintenance  Topic Date Due   COVID-19 Vaccine (5 - 2023-24 season) 01/16/2023 (Originally 07/13/2022)   Zoster Vaccines- Shingrix (1 of 2) 10/12/2023 (Originally [birth year]/1981)   Medicare Annual Wellness (AWV)  01/01/2024   DTaP/Tdap/Td (2 - Td or Tdap) 04/28/2028   Pneumonia Vaccine 89 Years old  Completed   INFLUENZA VACCINE  Completed   HPV VACCINES  Aged Out   DEXA SCAN  Discontinued    Health Maintenance  There are no preventive care reminders to display for this patient.   Colorectal cancer screening: No longer required.   Mammogram status: No longer required due to Age.    Lung Cancer Screening: (Low Dose CT Chest recommended if Age 87-80  years, 30 pack-year currently smoking OR have quit w/in 15years.) does not qualify.     Additional Screening:  Hepatitis C Screening: does not qualify; Completed   Vision Screening: Recommended annual ophthalmology exams for early detection of glaucoma and other disorders of the eye. Is the patient up to date with their annual eye exam?  No Who is the provider or what is the name of the office in which the patient attends annual eye exams? Patient deferred If pt is not established with a provider, would they like to be referred to a provider to establish care? No .   Dental Screening: Recommended annual dental exams for proper oral hygiene  Community Resource Referral / Chronic Care Management:  CRR required this visit?  No   CCM required this visit?  No      Plan:     I have personally reviewed and noted the following in the patient's chart:   Medical and social history Use of alcohol, tobacco or illicit drugs  Current medications and supplements including opioid prescriptions.  Patient is not currently taking opioid prescriptions. Functional ability and status Nutritional status Physical activity Advanced directives List of other physicians Hospitalizations, surgeries, and ER visits in previous 12 months Vitals Screenings to include cognitive, depression, and falls Referrals and appointments  In addition, I have reviewed and discussed with patient certain preventive protocols, quality metrics, and best practice recommendations. A written personalized care plan for preventive services as well as general preventive health recommendations were provided to patient.     [name], LPN   [id number]   Nurse Notes: Patient request f/u with concerns of blood in stool on occasions. Patient currently taking medication Coumadin daily.

## 2023-01-22 ENCOUNTER — Ambulatory Visit: Payer: Medicare HMO | Attending: Nurse Practitioner

## 2023-01-22 DIAGNOSIS — Z5181 Encounter for therapeutic drug level monitoring: Secondary | ICD-10-CM | POA: Diagnosis not present

## 2023-01-22 DIAGNOSIS — Z952 Presence of prosthetic heart valve: Secondary | ICD-10-CM | POA: Diagnosis not present

## 2023-01-22 DIAGNOSIS — Z7901 Long term (current) use of anticoagulants: Secondary | ICD-10-CM | POA: Diagnosis not present

## 2023-01-22 LAB — POCT INR: INR: 2.6 (ref 2.0–3.0)

## 2023-01-22 NOTE — Patient Instructions (Signed)
Description   Continue on same dosage 1 tablet daily except 1/2 tablet on Sundays, Tuesdays and Thursdays. Recheck INR in 3 weeks. Coumadin Clinic [phone].

## 2023-01-24 ENCOUNTER — Other Ambulatory Visit: Payer: Self-pay

## 2023-01-24 MED ORDER — FUROSEMIDE 20 MG PO TABS: 20.0000 mg | ORAL_TABLET | Freq: Every day | ORAL | 1 refills | Status: DC

## 2023-01-24 MED ORDER — VITAMIN D (ERGOCALCIFEROL) 1.25 MG (50000 UNIT) PO CAPS: 50000.0000 [IU] | ORAL_CAPSULE | ORAL | 3 refills | Status: DC

## 2023-01-24 MED ORDER — MEMANTINE HCL 10 MG PO TABS: 10.0000 mg | ORAL_TABLET | Freq: Two times a day (BID) | ORAL | 3 refills | Status: DC

## 2023-01-24 MED ORDER — EZETIMIBE-SIMVASTATIN 10-40 MG PO TABS: 1.0000 | ORAL_TABLET | Freq: Every day | ORAL | 3 refills | Status: DC

## 2023-01-25 ENCOUNTER — Other Ambulatory Visit: Payer: Self-pay | Admitting: Family Medicine

## 2023-02-07 ENCOUNTER — Other Ambulatory Visit: Payer: Self-pay | Admitting: Family Medicine

## 2023-02-07 ENCOUNTER — Other Ambulatory Visit: Payer: Self-pay

## 2023-02-07 MED ORDER — LISINOPRIL 5 MG PO TABS
5.0000 mg | ORAL_TABLET | Freq: Every day | ORAL | 0 refills | Status: DC
  Filled 2023-02-07: qty 90, 90d supply, fill #0

## 2023-02-08 ENCOUNTER — Other Ambulatory Visit (HOSPITAL_COMMUNITY): Payer: Self-pay

## 2023-02-18 ENCOUNTER — Ambulatory Visit: Payer: Medicare HMO | Attending: Internal Medicine | Admitting: *Deleted

## 2023-02-18 DIAGNOSIS — Z5181 Encounter for therapeutic drug level monitoring: Secondary | ICD-10-CM

## 2023-02-18 DIAGNOSIS — Z952 Presence of prosthetic heart valve: Secondary | ICD-10-CM

## 2023-02-18 LAB — POCT INR: POC INR: 2.4

## 2023-02-18 NOTE — Patient Instructions (Signed)
Description   Take 1 tablet of warfarin tomorrow then continue on same dosage 1 tablet daily except 1/2 tablet on Sundays, Tuesdays and Thursdays. Recheck INR in 3 weeks. Coumadin Clinic [phone].

## 2023-02-19 ENCOUNTER — Encounter: Payer: Self-pay | Admitting: Family Medicine

## 2023-02-20 MED ORDER — LISINOPRIL 5 MG PO TABS: 5.0000 mg | ORAL_TABLET | Freq: Every day | ORAL | 0 refills | Status: DC

## 2023-02-25 NOTE — Progress Notes (Unsigned)
  Cardiology Office Note:   Date:  02/27/2023  ID:  Virginia Franklin, DOB 1929/05/19, MRN [id number]  History of Present Illness:   Virginia Franklin is a 89 y.o. female  who presents for follow up of AVR secondary to rheumatic heart disease.  This is my first visit with her.   She had previously been seen by Dr. Katrinka Blazing. She has persistent atrial fib.   She denies any new cardiovascular symptoms.  She has some memory problems but answers questions well today.  She lives in a retirement home and walks with a walker.  She is in independent living and walks down to the dining hall. The patient denies any new symptoms such as chest discomfort, neck or arm discomfort. There has been no new shortness of breath, PND or orthopnea. There have been no reported palpitations, presyncope or syncope.   ROS: As stated in the HPI and negative for all other systems.  Studies Reviewed:    EKG: 08/21/2022 sinus rhythm with premature atrial contractions, axis within normal limits, intervals within normal limits, no acute ST-T wave changes.   Risk Assessment/Calculations:    CHA2DS2-VASc Score = 4 {  Physical Exam:   VS:  BP 132/62 (BP Location: Left Arm, Patient Position: Sitting, Cuff Size: Normal)   Pulse 64   Ht 5\' 6"  (1.676 m)   Wt 124 lb 3.2 oz (56.3 kg)   SpO2 94%   BMI 20.05 kg/m    Wt Readings from Last 3 Encounters:  02/26/23 124 lb 3.2 oz (56.3 kg)  12/31/22 128 lb (58.1 kg)  12/31/22 125 lb (56.7 kg)     GEN: Well nourished, well developed in no acute distress NECK: No JVD at 90 degrees with patient seated; No carotid bruits CARDIAC: RRR, 2 out of 6 apical systolic murmur radiating at the aortic outflow tract, no diastolic murmurs, rubs, gallops, mechanical S2 RESPIRATORY:  Clear to auscultation without rales, wheezing or rhonchi  ABDOMEN: Soft, non-tender, non-distended EXTREMITIES:  No edema; No deformity   ASSESSMENT AND PLAN:   Mechanical AVR: She had stable function in 2020  on echo.  She tolerates anticoagulation.  She understands endocarditis prophylaxis.  No change in therapy.  Hemorrhagic infarct:  This was in 2020.  She is tolerating anticoagulation as above.  Atrial fib: She has had no symptomatic paroxysms.  No change in therapy.  HTN: Blood pressure is well-controlled.  No change in therapy.   Signed, Rollene Rotunda, MD

## 2023-02-26 ENCOUNTER — Encounter: Payer: Self-pay | Admitting: Cardiology

## 2023-02-26 ENCOUNTER — Ambulatory Visit: Payer: Medicare HMO | Attending: Cardiology | Admitting: Cardiology

## 2023-02-26 VITALS — BP 132/62 | HR 64 | Ht 66.0 in | Wt 124.2 lb

## 2023-02-26 DIAGNOSIS — Z952 Presence of prosthetic heart valve: Secondary | ICD-10-CM | POA: Diagnosis not present

## 2023-02-26 DIAGNOSIS — I4811 Longstanding persistent atrial fibrillation: Secondary | ICD-10-CM

## 2023-02-26 NOTE — Patient Instructions (Signed)
    Follow-Up: At [facility], you and your health needs are our priority.  As part of our continuing mission to provide you with exceptional heart care, we have created designated Provider Care Teams.  These Care Teams include your primary Cardiologist (physician) and Advanced Practice Providers (APPs -  Physician Assistants and Nurse Practitioners) who all work together to provide you with the care you need, when you need it.  We recommend signing up for the patient portal called "MyChart".  Sign up information is provided on this After Visit Summary.  MyChart is used to connect with patients for Virtual Visits (Telemedicine).  Patients are able to view lab/test results, encounter notes, upcoming appointments, etc.  Non-urgent messages can be sent to your provider as well.   To learn more about what you can do with MyChart, go to [email or web address].    Your next appointment:   12 month(s)  Provider:   James Hochrein MD   

## 2023-02-27 ENCOUNTER — Encounter: Payer: Self-pay | Admitting: Cardiology

## 2023-03-11 ENCOUNTER — Ambulatory Visit: Payer: Medicare HMO | Attending: Nurse Practitioner | Admitting: *Deleted

## 2023-03-11 DIAGNOSIS — Z5181 Encounter for therapeutic drug level monitoring: Secondary | ICD-10-CM | POA: Diagnosis not present

## 2023-03-11 DIAGNOSIS — Z952 Presence of prosthetic heart valve: Secondary | ICD-10-CM

## 2023-03-11 DIAGNOSIS — Z7901 Long term (current) use of anticoagulants: Secondary | ICD-10-CM | POA: Diagnosis not present

## 2023-03-11 LAB — POCT INR: INR: 2.6 (ref 2.0–3.0)

## 2023-03-11 NOTE — Patient Instructions (Signed)
Description   Continue taking warfarin 1 tablet daily except 1/2 tablet on Sundays, Tuesdays and Thursdays. Recheck INR in 4 weeks. Coumadin Clinic [phone].

## 2023-03-22 ENCOUNTER — Telehealth: Payer: Self-pay | Admitting: Family Medicine

## 2023-03-22 NOTE — Telephone Encounter (Signed)
It is 5 mg

## 2023-03-22 NOTE — Telephone Encounter (Signed)
[facility] needs clarification regarding the lisinopril (ZESTRIL):  They are asking if the dosage should be 5 mg tablets or 10?  Please call:  [phone]  Ref: [phone]

## 2023-03-22 NOTE — Telephone Encounter (Signed)
Pt pharmacy aware that pt is taking 5 mg per Dr Clent Ridges. Spoke with pt daughter to advise that she should be taking Lisinopril 5 mg, verbalized understanding

## 2023-04-10 ENCOUNTER — Ambulatory Visit: Payer: Medicare HMO | Attending: Nurse Practitioner

## 2023-04-10 DIAGNOSIS — Z7901 Long term (current) use of anticoagulants: Secondary | ICD-10-CM

## 2023-04-10 DIAGNOSIS — Z5181 Encounter for therapeutic drug level monitoring: Secondary | ICD-10-CM

## 2023-04-10 DIAGNOSIS — Z952 Presence of prosthetic heart valve: Secondary | ICD-10-CM

## 2023-04-10 LAB — POCT INR: INR: 2 (ref 2.0–3.0)

## 2023-04-10 NOTE — Patient Instructions (Signed)
Description   Take an extra 1/2 tablet today, then resume same dosage of Warfarin 1 tablet daily except 1/2 tablet on Sundays, Tuesdays and Thursdays. Recheck INR in 3 weeks. Coumadin Clinic [phone].

## 2023-05-01 ENCOUNTER — Ambulatory Visit: Payer: Medicare HMO | Attending: Nurse Practitioner

## 2023-05-01 DIAGNOSIS — Z952 Presence of prosthetic heart valve: Secondary | ICD-10-CM | POA: Diagnosis not present

## 2023-05-01 DIAGNOSIS — Z5181 Encounter for therapeutic drug level monitoring: Secondary | ICD-10-CM

## 2023-05-01 LAB — POCT INR: INR: 2.5 (ref 2.0–3.0)

## 2023-05-01 NOTE — Patient Instructions (Signed)
Description   Take an extra 1/2 tablet today, then resume same dosage of Warfarin 1 tablet daily except 1/2 tablet on Sundays, Tuesdays and Thursdays.  Recheck INR in 4 weeks.  Coumadin Clinic [phone].

## 2023-05-10 ENCOUNTER — Other Ambulatory Visit: Payer: Self-pay

## 2023-05-10 ENCOUNTER — Telehealth: Payer: Self-pay | Admitting: Family Medicine

## 2023-05-10 MED ORDER — MEMANTINE HCL 10 MG PO TABS: 10.0000 mg | ORAL_TABLET | Freq: Two times a day (BID) | ORAL | 0 refills | Status: DC

## 2023-05-10 MED ORDER — EZETIMIBE-SIMVASTATIN 10-40 MG PO TABS: 1.0000 | ORAL_TABLET | Freq: Every day | ORAL | 0 refills | Status: DC

## 2023-05-10 NOTE — Telephone Encounter (Signed)
Spoke with pt daughter stated that she received a message to pick up pt medication from the pharmacy

## 2023-05-10 NOTE — Telephone Encounter (Signed)
Rx sent 

## 2023-05-10 NOTE — Telephone Encounter (Signed)
Pt daughter is calling and these medication per daughter rebecca are missing memantine (NAMENDA) 10 MG tablet , vytorin #30 each [id number] [place], [place] - [address] Phone: [phone]  Fax: [phone]

## 2023-05-20 ENCOUNTER — Other Ambulatory Visit: Payer: Self-pay | Admitting: Family Medicine

## 2023-05-23 ENCOUNTER — Other Ambulatory Visit: Payer: Self-pay | Admitting: Family Medicine

## 2023-05-28 ENCOUNTER — Ambulatory Visit: Payer: Medicare HMO | Attending: Nurse Practitioner

## 2023-05-28 DIAGNOSIS — Z7901 Long term (current) use of anticoagulants: Secondary | ICD-10-CM

## 2023-05-28 DIAGNOSIS — Z952 Presence of prosthetic heart valve: Secondary | ICD-10-CM | POA: Diagnosis not present

## 2023-05-28 DIAGNOSIS — Z5181 Encounter for therapeutic drug level monitoring: Secondary | ICD-10-CM

## 2023-05-28 LAB — POCT INR: INR: 3.3 — AB (ref 2.0–3.0)

## 2023-05-28 NOTE — Patient Instructions (Signed)
TAKE ONLY 0.5 TABLET WEDNESDAY THEN CONTINUE 1 tablet daily except 1/2 tablet on Sundays, Tuesdays and Thursdays.  Recheck INR in 4 weeks.  Coumadin Clinic [phone].

## 2023-05-31 ENCOUNTER — Inpatient Hospital Stay (HOSPITAL_COMMUNITY)
Admission: EM | Admit: 2023-05-31 | Discharge: 2023-06-04 | DRG: 536 | Disposition: A | Discharge status: Skilled Nursing Facility | Payer: Medicare HMO | Attending: Internal Medicine | Admitting: Internal Medicine

## 2023-05-31 ENCOUNTER — Other Ambulatory Visit: Payer: Self-pay

## 2023-05-31 ENCOUNTER — Inpatient Hospital Stay (HOSPITAL_COMMUNITY): Payer: Medicare HMO

## 2023-05-31 ENCOUNTER — Emergency Department (HOSPITAL_COMMUNITY): Payer: Medicare HMO

## 2023-05-31 DIAGNOSIS — Z8052 Family history of malignant neoplasm of bladder: Secondary | ICD-10-CM

## 2023-05-31 DIAGNOSIS — I482 Chronic atrial fibrillation, unspecified: Secondary | ICD-10-CM | POA: Diagnosis not present

## 2023-05-31 DIAGNOSIS — W06XXXA Fall from bed, initial encounter: Secondary | ICD-10-CM | POA: Diagnosis present

## 2023-05-31 DIAGNOSIS — F0280 Dementia in other diseases classified elsewhere without behavioral disturbance: Secondary | ICD-10-CM | POA: Diagnosis present

## 2023-05-31 DIAGNOSIS — Z952 Presence of prosthetic heart valve: Secondary | ICD-10-CM | POA: Diagnosis not present

## 2023-05-31 DIAGNOSIS — S329XXA Fracture of unspecified parts of lumbosacral spine and pelvis, initial encounter for closed fracture: Secondary | ICD-10-CM | POA: Diagnosis not present

## 2023-05-31 DIAGNOSIS — Z8673 Personal history of transient ischemic attack (TIA), and cerebral infarction without residual deficits: Secondary | ICD-10-CM | POA: Diagnosis not present

## 2023-05-31 DIAGNOSIS — I10 Essential (primary) hypertension: Secondary | ICD-10-CM | POA: Diagnosis present

## 2023-05-31 DIAGNOSIS — Z79899 Other long term (current) drug therapy: Secondary | ICD-10-CM

## 2023-05-31 DIAGNOSIS — S3792XA Contusion of unspecified urinary and pelvic organ, initial encounter: Secondary | ICD-10-CM | POA: Diagnosis present

## 2023-05-31 DIAGNOSIS — I4819 Other persistent atrial fibrillation: Secondary | ICD-10-CM | POA: Diagnosis present

## 2023-05-31 DIAGNOSIS — D62 Acute posthemorrhagic anemia: Secondary | ICD-10-CM | POA: Diagnosis present

## 2023-05-31 DIAGNOSIS — Z825 Family history of asthma and other chronic lower respiratory diseases: Secondary | ICD-10-CM

## 2023-05-31 DIAGNOSIS — M5136 Other intervertebral disc degeneration, lumbar region: Secondary | ICD-10-CM | POA: Diagnosis present

## 2023-05-31 DIAGNOSIS — Z8249 Family history of ischemic heart disease and other diseases of the circulatory system: Secondary | ICD-10-CM

## 2023-05-31 DIAGNOSIS — W19XXXA Unspecified fall, initial encounter: Secondary | ICD-10-CM

## 2023-05-31 DIAGNOSIS — Z880 Allergy status to penicillin: Secondary | ICD-10-CM

## 2023-05-31 DIAGNOSIS — D6832 Hemorrhagic disorder due to extrinsic circulating anticoagulants: Secondary | ICD-10-CM | POA: Diagnosis present

## 2023-05-31 DIAGNOSIS — Z8541 Personal history of malignant neoplasm of cervix uteri: Secondary | ICD-10-CM | POA: Diagnosis not present

## 2023-05-31 DIAGNOSIS — S32591A Other specified fracture of right pubis, initial encounter for closed fracture: Secondary | ICD-10-CM | POA: Diagnosis not present

## 2023-05-31 DIAGNOSIS — S32501A Unspecified fracture of right pubis, initial encounter for closed fracture: Secondary | ICD-10-CM

## 2023-05-31 DIAGNOSIS — G309 Alzheimer's disease, unspecified: Secondary | ICD-10-CM | POA: Diagnosis present

## 2023-05-31 DIAGNOSIS — M810 Age-related osteoporosis without current pathological fracture: Secondary | ICD-10-CM | POA: Diagnosis present

## 2023-05-31 DIAGNOSIS — S32511A Fracture of superior rim of right pubis, initial encounter for closed fracture: Principal | ICD-10-CM

## 2023-05-31 DIAGNOSIS — Y92009 Unspecified place in unspecified non-institutional (private) residence as the place of occurrence of the external cause: Secondary | ICD-10-CM | POA: Diagnosis not present

## 2023-05-31 DIAGNOSIS — I4892 Unspecified atrial flutter: Secondary | ICD-10-CM | POA: Diagnosis present

## 2023-05-31 DIAGNOSIS — Z8261 Family history of arthritis: Secondary | ICD-10-CM | POA: Diagnosis not present

## 2023-05-31 DIAGNOSIS — R7989 Other specified abnormal findings of blood chemistry: Secondary | ICD-10-CM | POA: Diagnosis present

## 2023-05-31 DIAGNOSIS — Z7901 Long term (current) use of anticoagulants: Secondary | ICD-10-CM

## 2023-05-31 DIAGNOSIS — T45515A Adverse effect of anticoagulants, initial encounter: Secondary | ICD-10-CM | POA: Diagnosis present

## 2023-05-31 DIAGNOSIS — M25571 Pain in right ankle and joints of right foot: Secondary | ICD-10-CM | POA: Diagnosis present

## 2023-05-31 DIAGNOSIS — D61818 Other pancytopenia: Secondary | ICD-10-CM | POA: Diagnosis present

## 2023-05-31 DIAGNOSIS — E876 Hypokalemia: Secondary | ICD-10-CM | POA: Diagnosis present

## 2023-05-31 LAB — BASIC METABOLIC PANEL
Anion gap: 10 (ref 5–15)
BUN: 16 mg/dL (ref 8–23)
CO2: 22 mmol/L (ref 22–32)
Calcium: 9.2 mg/dL (ref 8.9–10.3)
Chloride: 104 mmol/L (ref 98–111)
Creatinine, Ser: 0.85 mg/dL (ref 0.44–1.00)
GFR, Estimated: >60 mL/min (ref >60)
Glucose, Bld: 145 mg/dL — ABNORMAL HIGH (ref 70–99)
Potassium: 3.5 mmol/L (ref 3.5–5.1)
Sodium: 136 mmol/L (ref 135–145)

## 2023-05-31 LAB — I-STAT CHEM 8, ED
BUN: 18 mg/dL (ref 8–23)
Calcium, Ion: 1.13 mmol/L — ABNORMAL LOW (ref 1.15–1.40)
Chloride: 102 mmol/L (ref 98–111)
Creatinine, Ser: 0.8 mg/dL (ref 0.44–1.00)
Glucose, Bld: 148 mg/dL — ABNORMAL HIGH (ref 70–99)
HCT: 38 % (ref 36.0–46.0)
Hemoglobin: 12.9 g/dL (ref 12.0–15.0)
Potassium: 3.6 mmol/L (ref 3.5–5.1)
Sodium: 138 mmol/L (ref 135–145)
TCO2: 22 mmol/L (ref 22–32)

## 2023-05-31 LAB — CBC
HCT: 38.5 % (ref 36.0–46.0)
Hemoglobin: 12.6 g/dL (ref 12.0–15.0)
MCH: 31.6 pg (ref 26.0–34.0)
MCHC: 32.7 g/dL (ref 30.0–36.0)
MCV: 96.5 fL (ref 80.0–100.0)
Platelets: 142 10*3/uL — ABNORMAL LOW (ref 150–400)
RBC: 3.99 MIL/uL (ref 3.87–5.11)
RDW: 13.9 % (ref 11.5–15.5)
WBC: 4.6 10*3/uL (ref 4.0–10.5)
nRBC: 0 % (ref 0.0–0.2)

## 2023-05-31 LAB — PROTIME-INR
INR: 2.2 — ABNORMAL HIGH (ref 0.8–1.2)
Prothrombin Time: 24.5 seconds — ABNORMAL HIGH (ref 11.4–15.2)

## 2023-05-31 MED ORDER — CALCIUM GLUCONATE-NACL 1-0.675 GM/50ML-% IV SOLN
1.0000 g | Freq: Once | INTRAVENOUS | Status: DC
  Filled 2023-05-31: qty 50

## 2023-05-31 MED ORDER — METHOCARBAMOL 1000 MG/10ML IJ SOLN: 500.0000 mg | Freq: Four times a day (QID) | INTRAVENOUS | Status: DC | PRN

## 2023-05-31 MED ORDER — METHOCARBAMOL 500 MG PO TABS
500.0000 mg | ORAL_TABLET | Freq: Three times a day (TID) | ORAL | Status: DC | PRN
  Administered 2023-05-31 – 2023-06-03 (×3): 500 mg via ORAL
  Filled 2023-05-31 (×5): qty 1

## 2023-05-31 MED ORDER — MORPHINE SULFATE (PF) 2 MG/ML IV SOLN
0.5000 mg | INTRAVENOUS | Status: DC | PRN
  Administered 2023-06-01 – 2023-06-02 (×2): 0.5 mg via INTRAVENOUS
  Filled 2023-05-31 (×2): qty 1

## 2023-05-31 MED ORDER — HYDROCODONE-ACETAMINOPHEN 5-325 MG PO TABS
1.0000 | ORAL_TABLET | Freq: Four times a day (QID) | ORAL | Status: DC | PRN
  Administered 2023-05-31 – 2023-06-02 (×2): 1 via ORAL
  Filled 2023-05-31 (×4): qty 1

## 2023-05-31 NOTE — ED Notes (Signed)
X-ray present

## 2023-05-31 NOTE — ED Notes (Signed)
Patient transported to CT with nurse

## 2023-05-31 NOTE — ED Provider Notes (Signed)
[facility] AT [facility] Provider Note   CSN: [id number] Arrival date & time: 05/31/23  1737     History  Chief Complaint  Patient presents with   Trauma   Fall    Virginia Franklin is a 89 y.o. female w/ hx of AVR from rhyumatic heart disease, persistent A Fib, on coumadin, presenting to ED with unwitnessed fall at her independent living at [facility].  According to EMS and patient's family member she does have some developing dementia, but has been living independently until down, typically walks with a walker but the family reports that she has had progressive weakening and difficulty using her walker for the past few months.  The patient denies any discomfort initially on exam but later reports pain in her right hip.  HPI     Home Medications Prior to Admission medications   Medication Sig Start Date End Date Taking? Authorizing Provider  clotrimazole-betamethasone (LOTRISONE) cream Apply 1 Application topically daily. Patient not taking: Reported on 02/26/2023 04/27/22   Worthy Rancher B, FNP  donepezil (ARICEPT) 5 MG tablet TAKE 1 TABLET AT BEDTIME 05/23/23   Nelwyn Salisbury, MD  escitalopram (LEXAPRO) 5 MG tablet TAKE 1 TABLET DAILY 05/20/23   Nelwyn Salisbury, MD  ezetimibe-simvastatin (VYTORIN) 10-40 MG tablet Take 1 tablet by mouth daily. 05/10/23   Nelwyn Salisbury, MD  furosemide (LASIX) 20 MG tablet Take 1 tablet (20 mg total) by mouth daily. 01/24/23   Nelwyn Salisbury, MD  lisinopril (ZESTRIL) 5 MG tablet Take 1 tablet (5 mg total) by mouth daily. 02/20/23   Nelwyn Salisbury, MD  memantine (NAMENDA) 10 MG tablet Take 1 tablet (10 mg total) by mouth 2 (two) times daily. 05/10/23   Nelwyn Salisbury, MD  oxybutynin (DITROPAN-XL) 5 MG 24 hr tablet Take 1 tablet (5 mg total) by mouth at bedtime. 07/05/22   Nelwyn Salisbury, MD  Vitamin D, Ergocalciferol, (DRISDOL) 1.25 MG (50000 UNIT) CAPS capsule Take 1 capsule (50,000 Units total) by mouth every 7 (seven)  days. 01/24/23   Nelwyn Salisbury, MD  warfarin (COUMADIN) 7.5 MG tablet TAKE ONE-HALF (1/2) TO ONE TABLET DAILY AS DIRECTED BY THE COUMADIN CLINIC 07/31/22   Lyn Records, MD      Allergies    Penicillins    Review of Systems   Review of Systems  Physical Exam Updated Vital Signs BP (!) 155/89   Pulse (!) 128   Temp 98.5 F (36.9 C)   Resp (!) 29   SpO2 96%  Physical Exam Constitutional:      General: She is not in acute distress. HENT:     Head: Normocephalic and atraumatic.  Eyes:     Conjunctiva/sclera: Conjunctivae normal.     Pupils: Pupils are equal, round, and reactive to light.  Neck:     Comments: C-spine collar in place Cardiovascular:     Rate and Rhythm: Normal rate. Rhythm irregular.  Pulmonary:     Effort: Pulmonary effort is normal. No respiratory distress.  Abdominal:     General: There is no distension.     Tenderness: There is no abdominal tenderness.  Musculoskeletal:     Comments: Tenderness with range of motion testing at the right hip  Skin:    General: Skin is warm and dry.  Neurological:     General: No focal deficit present.     Mental Status: She is alert and oriented to person, place, and time.  Mental status is at baseline.  Psychiatric:        Mood and Affect: Mood normal.        Behavior: Behavior normal.     ED Results / Procedures / Treatments   Labs (all labs ordered are listed, but only abnormal results are displayed) Labs Reviewed  BASIC METABOLIC PANEL - Abnormal; Notable for the following components:      Result Value   Glucose, Bld 145 (*)    All other components within normal limits  CBC - Abnormal; Notable for the following components:   Platelets 142 (*)    All other components within normal limits  PROTIME-INR - Abnormal; Notable for the following components:   Prothrombin Time 24.5 (*)    INR 2.2 (*)    All other components within normal limits  I-STAT CHEM 8, ED - Abnormal; Notable for the following components:    Glucose, Bld 148 (*)    Calcium, Ion 1.13 (*)    All other components within normal limits    EKG EKG Interpretation Date/Time:  Friday May 31 2023 18:01:02 EDT Ventricular Rate:  88 PR Interval:    QRS Duration:  92 QT Interval:  377 QTC Calculation: 449 R Axis:   70  Text Interpretation: Atrial flutter Posterior infarct, old Borderline repolarization abnormality Confirmed by Alvester Chou [phone]) on 05/31/2023 6:06:35 PM  Radiology CT Head Wo Contrast  Result Date: 05/31/2023 CLINICAL DATA:  Head trauma, minor (Age >= 65y); Polytrauma, blunt EXAM: CT HEAD WITHOUT CONTRAST CT CERVICAL SPINE WITHOUT CONTRAST TECHNIQUE: Multidetector CT imaging of the head and cervical spine was performed following the standard protocol without intravenous contrast. Multiplanar CT image reconstructions of the cervical spine were also generated. RADIATION DOSE REDUCTION: This exam was performed according to the departmental dose-optimization program which includes automated exposure control, adjustment of the mA and/or kV according to patient size and/or use of iterative reconstruction technique. COMPARISON:  CT head 05/31/2021, CT angio head and neck 09/04/2019 FINDINGS: CT HEAD FINDINGS Brain: Cerebral ventricle sizes are concordant with the degree of cerebral volume loss. Patchy and confluent areas of decreased attenuation are noted throughout the deep and periventricular white matter of the cerebral hemispheres bilaterally, compatible with chronic microvascular ischemic disease. No evidence of large-territorial acute infarction. No parenchymal hemorrhage. No mass lesion. No extra-axial collection. No mass effect or midline shift. No hydrocephalus. Basilar cisterns are patent. Vascular: No hyperdense vessel. Atherosclerotic calcifications are present within the cavernous internal carotid and vertebral arteries. Skull: No acute fracture or focal lesion. Sinuses/Orbits: Paranasal sinuses and mastoid air cells  are clear. Bilateral lens replacement. Otherwise the orbits are unremarkable. Other: None. CT CERVICAL SPINE FINDINGS Alignment: Grade 1 anterolisthesis of C2 on C3. Skull base and vertebrae: Multilevel at least moderate degenerative changes spine. No associated severe osseous neural foraminal or central canal stenosis. No acute fracture. No aggressive appearing focal osseous lesion or focal pathologic process. Soft tissues and spinal canal: No prevertebral fluid or swelling. No visible canal hematoma. Upper chest: Unremarkable. Other: Atherosclerotic plaque of the aortic arch and its main branches. IMPRESSION: 1. No acute intracranial abnormality. 2. No acute displaced fracture or traumatic listhesis of the cervical spine. 3.  Aortic Atherosclerosis (ICD10-I70.0). Electronically Signed   By: Tish Frederickson M.D.   On: 05/31/2023 18:34   CT Cervical Spine Wo Contrast  Result Date: 05/31/2023 CLINICAL DATA:  Head trauma, minor (Age >= 65y); Polytrauma, blunt EXAM: CT HEAD WITHOUT CONTRAST CT CERVICAL SPINE WITHOUT CONTRAST TECHNIQUE: Multidetector  CT imaging of the head and cervical spine was performed following the standard protocol without intravenous contrast. Multiplanar CT image reconstructions of the cervical spine were also generated. RADIATION DOSE REDUCTION: This exam was performed according to the departmental dose-optimization program which includes automated exposure control, adjustment of the mA and/or kV according to patient size and/or use of iterative reconstruction technique. COMPARISON:  CT head 05/31/2021, CT angio head and neck 09/04/2019 FINDINGS: CT HEAD FINDINGS Brain: Cerebral ventricle sizes are concordant with the degree of cerebral volume loss. Patchy and confluent areas of decreased attenuation are noted throughout the deep and periventricular white matter of the cerebral hemispheres bilaterally, compatible with chronic microvascular ischemic disease. No evidence of large-territorial  acute infarction. No parenchymal hemorrhage. No mass lesion. No extra-axial collection. No mass effect or midline shift. No hydrocephalus. Basilar cisterns are patent. Vascular: No hyperdense vessel. Atherosclerotic calcifications are present within the cavernous internal carotid and vertebral arteries. Skull: No acute fracture or focal lesion. Sinuses/Orbits: Paranasal sinuses and mastoid air cells are clear. Bilateral lens replacement. Otherwise the orbits are unremarkable. Other: None. CT CERVICAL SPINE FINDINGS Alignment: Grade 1 anterolisthesis of C2 on C3. Skull base and vertebrae: Multilevel at least moderate degenerative changes spine. No associated severe osseous neural foraminal or central canal stenosis. No acute fracture. No aggressive appearing focal osseous lesion or focal pathologic process. Soft tissues and spinal canal: No prevertebral fluid or swelling. No visible canal hematoma. Upper chest: Unremarkable. Other: Atherosclerotic plaque of the aortic arch and its main branches. IMPRESSION: 1. No acute intracranial abnormality. 2. No acute displaced fracture or traumatic listhesis of the cervical spine. 3.  Aortic Atherosclerosis (ICD10-I70.0). Electronically Signed   By: Tish Frederickson M.D.   On: 05/31/2023 18:34   DG Femur Min 2 Views Right  Result Date: 05/31/2023 CLINICAL DATA:  trauma EXAM: RIGHT FEMUR 2 VIEWS COMPARISON:  X-ray pelvis/right hip 05/31/2023 FINDINGS: Age-indeterminate, likely acute, superior right pubic rami fracture. There is no evidence of fracture or other focal bone lesions of the right acetabulum or femur. Soft tissues are unremarkable. Vascular calcifications. Severe degenerative changes of the tibiofemoral joint. IMPRESSION: Age indeterminate, likely acute, displaced superior right pubic rami fracture. Electronically Signed   By: Tish Frederickson M.D.   On: 05/31/2023 18:17   DG Pelvis Portable  Result Date: 05/31/2023 CLINICAL DATA:  Abdominal x-ray EXAM: PORTABLE  PELVIS 1-2 VIEWS COMPARISON:  CT abdomen pelvis 03/29/2019 FINDINGS: Limited evaluation due to overlapping osseous structures and overlying soft tissues. Mild degenerative changes of the right hip. Likely right superior pubic rami fracture. There is no evidence of pelvic fracture or diastasis. No pelvic bone lesions are seen. Degenerative changes of the visualized lower lumbar spine. Lucency along the left transverse process of L5 level. Calcifications along the midline lower abdomen at the level of the L4-L5 level correlates with findings on the CT abdomen pelvis 03/29/2019. IMPRESSION: 1. Likely right superior pubic rami fracture. Limited evaluation due to overlapping osseous structures and overlying soft tissues. Limited evaluation on single view radiograph. 2. Lucency along the left transverse process of L5 level. Possible nondisplaced fracture. Electronically Signed   By: Tish Frederickson M.D.   On: 05/31/2023 18:15   DG Chest Portable 1 View  Result Date: 05/31/2023 CLINICAL DATA:  trauma EXAM: PORTABLE CHEST 1 VIEW COMPARISON:  Chest x-ray 09/04/2019 FINDINGS: Loop catheter overlies the upper mediastinum. The heart and mediastinal contours are unchanged. Aortic calcification. No focal consolidation. No pulmonary edema. No pleural effusion. No pneumothorax. No acute  osseous abnormality. Severe degenerative changes of the left shoulder. IMPRESSION: 1. No active disease. 2.  Aortic Atherosclerosis (ICD10-I70.0). Electronically Signed   By: Tish Frederickson M.D.   On: 05/31/2023 18:10    Procedures Procedures    Medications Ordered in ED Medications - No data to display  ED Course/ Medical Decision Making/ A&P Clinical Course as of 05/31/23 2018  Fri May 31, 2023  1915 Patient's son-in-law and granddaughter in law are now present at the bedside and updated regarding her diagnosis.  Will plan for medical admission for SNF placement.   [MT]  1942 Dr Victorino Dike [facility] recommending hospitalist admit,  unlikely to require surgery, does not need to be NPO at midnight, can continue coumadin unless concern for developing bleeding. [MT]  1945 Admitted to hospitalist [MT]  2016 HR is currently 70 bpm, not 128 [MT]    Clinical Course User Index [MT] Janathan Bribiesca, Kermit Balo, MD                             Medical Decision Making Amount and/or Complexity of Data Reviewed Labs: ordered. Radiology: ordered. ECG/medicine tests: ordered.  Risk Decision regarding hospitalization.   Patient is presenting by EMS with an unwitnessed fall at home.  Trauma evaluation completed, CT imaging and x-rays reviewed personally, no acute intracranial injury.  Patient is noted to have a likely superior pubic rami fracture in the right pelvis which correlates to her pain on exam.  She does not have a large hematoma or evidence of ecchymoses.  There is also question about a potential small lumbar transverse process fracture, which would be stable at this time and nonsurgical.  Supplemental history is provided by the patient's family as well as EMS.  The orthopedic on-call surgeon was consulted by phone, please see ED course above, and will follow along the patient in the hospital but has no plans for emergent surgery.  At this time there is no evidence of active hemorrhage within the pelvis or indication for CT angiogram imaging.  The patient is hemodynamically stable, very pleasant, does not appear to be in any acute distress while laying in the bed.  Her vital signs have remained unremarkable, with heart rate and rate controlled A-fib.  I personally viewed her labs including her INR of 2.2.  Will plan for medical admission for PT evaluation, ortho consult, and likely needing SNF placement.        Final Clinical Impression(s) / ED Diagnoses Final diagnoses:  Closed fracture of superior ramus of right pubis, initial encounter (HCC)  Fall, initial encounter    Rx / DC Orders ED Discharge Orders     None          Allani Reber, Kermit Balo, MD 05/31/23 2020

## 2023-05-31 NOTE — H&P (Signed)
History and Physical    Virginia Franklin ZOX:[id number] DOB: 1929/01/10 DOA: 05/31/2023  PCP: Nelwyn Salisbury, MD  [facility]  [facility]  I have personally briefly reviewed patient's old medical records in [facility]  Chief Complaint:  fall   HPI: Virginia Franklin is a 89 y.o. female with medical history significant of  Alzheimer's dementia, Arthritis, OA, CVA ischemic, hemorrhagic CVA 09-04-19 Hypertension, DJD of lumbar spine( hx of stress fracture),Atrial fibrillation, mechanical AVR secondary to rheumatic heart disease on warfarin who presents to ED s/p fall out off bed today with resulting pelvic pain. Patient noted no fever/chills/ n/v/d/or dysuria. Per family she has fall after am med past and was on floor for a few hours. Patient noted continue pain in pelvis but worse with movement , she also note tenderness of right ankle. She denies , chest pain or sob.    ED Course:  Afeb, BP 140/70,  hr 84, RR 18 sat 98%   EKG: Atrial flutter  CTH/CT cervical spine: NAD Xray/pelvis: MPRESSION: Age indeterminate, likely acute, displaced superior right pubic rami fracture. [name] along the left transverse process of L5 level. Possible nondisplaced fracture.  cxr:NAD  Labs: NA 138, K 3.6, CL102, cr 0.8, glu 148,  calcium 1.13 Wbc 4.6, hgb 12.6, lt 142 INR 2.2  Review of Systems: As per HPI otherwise 10 point review of systems negative.   Past Medical History:  Diagnosis Date   Alzheimer's dementia (HCC) 2017   Arthritis    OA   Body mass index (BMI) of 22.0-22.9 in adult    Cervical cancer (HCC)    had hysterctomy for   H/O osteoporosis    H/O: CVA (cerebrovascular accident) 2012   , HEMORRHAGIC 09-04-2019 ALSO   H/O: osteoarthritis    Hammertoe of left foot    History of urinary frequency    Hx of diverticulitis of colon    Hypertension    Lumbar stress fracture lL3, 01-2020    Past Surgical History:  Procedure  Laterality Date   ABDOMINAL HYSTERECTOMY     CATARACT EXTRACTION Bilateral    HAMMER TOE SURGERY Left 07/29/2020   Procedure: HAMMER TOE REPAIR 2ND TOE LEFT;  Surgeon: Edwin Cap, DPM;  Location: [facility];  Service: Podiatry;  Laterality: Left;   INCISION AND DRAINAGE Bilateral 07/29/2020   Procedure: EXCISION OF WOUND OF LEFT SECOND TOE; RELEASE OF TENDONS LEFT THIRD TOE, RIGHT THIRD TOE;  Surgeon: Edwin Cap, DPM;  Location: [facility];  Service: Podiatry;  Laterality: Bilateral;   mechanical heart valve  1990   st jude done in [place]   SMALL INTESTINE SURGERY       reports that she has never smoked. She has never used smokeless tobacco. She reports current alcohol use. She reports that she does not use drugs.  Allergies  Allergen Reactions   Penicillins Anaphylaxis and Swelling    Has patient had a PCN reaction causing immediate rash, facial/tongue/throat swelling, SOB or lightheadedness with hypotension: Yes Has patient had a PCN reaction causing severe rash involving mucus membranes or skin necrosis: No Has patient had a PCN reaction that required hospitalization: No Has patient had a PCN reaction occurring within the last 10 years: No If all of the above answers are "NO", then may proceed with Cephalosporin use.    Family History  Problem Relation Age of Onset   Kidney disease Mother  MALIGNANT NEOPLASM   Arthritis Father    Heart Problems Father    Lung disease Brother    Heart disease Neg Hx     Prior to Admission medications   Medication Sig Start Date End Date Taking? Authorizing Provider  clotrimazole-betamethasone (LOTRISONE) cream Apply 1 Application topically daily. Patient not taking: Reported on 02/26/2023 04/27/22   Worthy Rancher B, FNP  donepezil (ARICEPT) 5 MG tablet TAKE 1 TABLET AT BEDTIME 05/23/23   Nelwyn Salisbury, MD  escitalopram (LEXAPRO) 5 MG tablet TAKE 1 TABLET DAILY 05/20/23   Nelwyn Salisbury, MD   ezetimibe-simvastatin (VYTORIN) 10-40 MG tablet Take 1 tablet by mouth daily. 05/10/23   Nelwyn Salisbury, MD  furosemide (LASIX) 20 MG tablet Take 1 tablet (20 mg total) by mouth daily. 01/24/23   Nelwyn Salisbury, MD  lisinopril (ZESTRIL) 5 MG tablet Take 1 tablet (5 mg total) by mouth daily. 02/20/23   Nelwyn Salisbury, MD  memantine (NAMENDA) 10 MG tablet Take 1 tablet (10 mg total) by mouth 2 (two) times daily. 05/10/23   Nelwyn Salisbury, MD  oxybutynin (DITROPAN-XL) 5 MG 24 hr tablet Take 1 tablet (5 mg total) by mouth at bedtime. 07/05/22   Nelwyn Salisbury, MD  Vitamin D, Ergocalciferol, (DRISDOL) 1.25 MG (50000 UNIT) CAPS capsule Take 1 capsule (50,000 Units total) by mouth every 7 (seven) days. 01/24/23   Nelwyn Salisbury, MD  warfarin (COUMADIN) 7.5 MG tablet TAKE ONE-HALF (1/2) TO ONE TABLET DAILY AS DIRECTED BY THE COUMADIN CLINIC 07/31/22   Lyn Records, MD    Physical Exam: Vitals:   05/31/23 1900 05/31/23 1915 05/31/23 1923 05/31/23 1930  BP: (!) 159/129 (!) 150/132  (!) 155/89  Pulse: 72 86  (!) 128  Resp:    (!) 29  Temp:      SpO2: 95% 98% 95% 96%    Constitutional: NAD, calm, comfortable Vitals:   05/31/23 1900 05/31/23 1915 05/31/23 1923 05/31/23 1930  BP: (!) 159/129 (!) 150/132  (!) 155/89  Pulse: 72 86  (!) 128  Resp:    (!) 29  Temp:      SpO2: 95% 98% 95% 96%   Eyes: PERRL, lids and conjunctivae normal ENMT: Mucous membranes are moist. Posterior pharynx clear of any exudate or lesions.Normal dentition.  Neck: normal, supple, no masses, no thyromegaly Respiratory: rare rhonchi anterior right,  no wheezing, no crackles. Normal respiratory effort. No accessory muscle use.  Cardiovascular: Regular rate and rhythm, no murmurs / rubs / gallops. No extremity edema. 2+ pedal pulses.  Abdomen: no tenderness, no masses palpated. No hepatosplenomegaly. Bowel sounds positive.  Musculoskeletal: no clubbing / cyanosis. No joint deformity upper and lower extremities. Pain with ROM  right leg and right ankle, no swelling noted. no contractures. Normal muscle tone.  Skin: no rashes Neurologic: CN 2-12 grossly intact. Sensation intact, MAE except right lower ext pain with ROM esp at ankle, Psychiatric: Normal judgment and insight. Alert and oriented x 3. Normal mood.    Labs on Admission: I have personally reviewed following labs and imaging studies  CBC: Recent Labs  Lab 05/31/23 1813  WBC 4.6  HGB 12.6  12.9  HCT 38.5  38.0  MCV 96.5  PLT 142*   Basic Metabolic Panel: Recent Labs  Lab 05/31/23 1813  NA 136  138  K 3.5  3.6  CL 104  102  CO2 22  GLUCOSE 145*  148*  BUN 16  18  CREATININE 0.85  0.80  CALCIUM 9.2   GFR: CrCl cannot be calculated (Unknown ideal weight.). Liver Function Tests: No results for input(s): "AST", "ALT", "ALKPHOS", "BILITOT", "PROT", "ALBUMIN" in the last 168 hours. No results for input(s): "LIPASE", "AMYLASE" in the last 168 hours. No results for input(s): "AMMONIA" in the last 168 hours. Coagulation Profile: Recent Labs  Lab 05/28/23 1408 05/31/23 1813  INR 3.3* 2.2*   Cardiac Enzymes: No results for input(s): "CKTOTAL", "CKMB", "CKMBINDEX", "TROPONINI" in the last 168 hours. BNP (last 3 results) No results for input(s): "PROBNP" in the last 8760 hours. HbA1C: No results for input(s): "HGBA1C" in the last 72 hours. CBG: No results for input(s): "GLUCAP" in the last 168 hours. Lipid Profile: No results for input(s): "CHOL", "HDL", "LDLCALC", "TRIG", "CHOLHDL", "LDLDIRECT" in the last 72 hours. Thyroid Function Tests: No results for input(s): "TSH", "T4TOTAL", "FREET4", "T3FREE", "THYROIDAB" in the last 72 hours. Anemia Panel: No results for input(s): "VITAMINB12", "FOLATE", "FERRITIN", "TIBC", "IRON", "RETICCTPCT" in the last 72 hours. Urine analysis:    Component Value Date/Time   COLORURINE YELLOW 01/17/2020 1256   APPEARANCEUR CLEAR 01/17/2020 1256   LABSPEC 1.023 01/17/2020 1256   PHURINE 5.0  01/17/2020 1256   GLUCOSEU NEGATIVE 01/17/2020 1256   HGBUR SMALL (A) 01/17/2020 1256   BILIRUBINUR NEGATIVE 01/17/2020 1256   BILIRUBINUR neg 10/02/2019 1557   KETONESUR 20 (A) 01/17/2020 1256   PROTEINUR NEGATIVE 01/17/2020 1256   UROBILINOGEN 0.2 10/02/2019 1557   NITRITE NEGATIVE 01/17/2020 1256   LEUKOCYTESUR NEGATIVE 01/17/2020 1256    Radiological Exams on Admission: CT Head Wo Contrast  Result Date: 05/31/2023 CLINICAL DATA:  Head trauma, minor (Age >= 65y); Polytrauma, blunt EXAM: CT HEAD WITHOUT CONTRAST CT CERVICAL SPINE WITHOUT CONTRAST TECHNIQUE: Multidetector CT imaging of the head and cervical spine was performed following the standard protocol without intravenous contrast. Multiplanar CT image reconstructions of the cervical spine were also generated. RADIATION DOSE REDUCTION: This exam was performed according to the departmental dose-optimization program which includes automated exposure control, adjustment of the mA and/or kV according to patient size and/or use of iterative reconstruction technique. COMPARISON:  CT head 05/31/2021, CT angio head and neck 09/04/2019 FINDINGS: CT HEAD FINDINGS Brain: Cerebral ventricle sizes are concordant with the degree of cerebral volume loss. Patchy and confluent areas of decreased attenuation are noted throughout the deep and periventricular white matter of the cerebral hemispheres bilaterally, compatible with chronic microvascular ischemic disease. No evidence of large-territorial acute infarction. No parenchymal hemorrhage. No mass lesion. No extra-axial collection. No mass effect or midline shift. No hydrocephalus. Basilar cisterns are patent. Vascular: No hyperdense vessel. Atherosclerotic calcifications are present within the cavernous internal carotid and vertebral arteries. Skull: No acute fracture or focal lesion. Sinuses/Orbits: Paranasal sinuses and mastoid air cells are clear. Bilateral lens replacement. Otherwise the orbits are  unremarkable. Other: None. CT CERVICAL SPINE FINDINGS Alignment: Grade 1 anterolisthesis of C2 on C3. Skull base and vertebrae: Multilevel at least moderate degenerative changes spine. No associated severe osseous neural foraminal or central canal stenosis. No acute fracture. No aggressive appearing focal osseous lesion or focal pathologic process. Soft tissues and spinal canal: No prevertebral fluid or swelling. No visible canal hematoma. Upper chest: Unremarkable. Other: Atherosclerotic plaque of the aortic arch and its main branches. IMPRESSION: 1. No acute intracranial abnormality. 2. No acute displaced fracture or traumatic listhesis of the cervical spine. 3.  Aortic Atherosclerosis (ICD10-I70.0). Electronically Signed   By: Tish Frederickson M.D.   On: 05/31/2023 18:34   CT Cervical  Spine Wo Contrast  Result Date: 05/31/2023 CLINICAL DATA:  Head trauma, minor (Age >= 65y); Polytrauma, blunt EXAM: CT HEAD WITHOUT CONTRAST CT CERVICAL SPINE WITHOUT CONTRAST TECHNIQUE: Multidetector CT imaging of the head and cervical spine was performed following the standard protocol without intravenous contrast. Multiplanar CT image reconstructions of the cervical spine were also generated. RADIATION DOSE REDUCTION: This exam was performed according to the departmental dose-optimization program which includes automated exposure control, adjustment of the mA and/or kV according to patient size and/or use of iterative reconstruction technique. COMPARISON:  CT head 05/31/2021, CT angio head and neck 09/04/2019 FINDINGS: CT HEAD FINDINGS Brain: Cerebral ventricle sizes are concordant with the degree of cerebral volume loss. Patchy and confluent areas of decreased attenuation are noted throughout the deep and periventricular white matter of the cerebral hemispheres bilaterally, compatible with chronic microvascular ischemic disease. No evidence of large-territorial acute infarction. No parenchymal hemorrhage. No mass lesion. No  extra-axial collection. No mass effect or midline shift. No hydrocephalus. Basilar cisterns are patent. Vascular: No hyperdense vessel. Atherosclerotic calcifications are present within the cavernous internal carotid and vertebral arteries. Skull: No acute fracture or focal lesion. Sinuses/Orbits: Paranasal sinuses and mastoid air cells are clear. Bilateral lens replacement. Otherwise the orbits are unremarkable. Other: None. CT CERVICAL SPINE FINDINGS Alignment: Grade 1 anterolisthesis of C2 on C3. Skull base and vertebrae: Multilevel at least moderate degenerative changes spine. No associated severe osseous neural foraminal or central canal stenosis. No acute fracture. No aggressive appearing focal osseous lesion or focal pathologic process. Soft tissues and spinal canal: No prevertebral fluid or swelling. No visible canal hematoma. Upper chest: Unremarkable. Other: Atherosclerotic plaque of the aortic arch and its main branches. IMPRESSION: 1. No acute intracranial abnormality. 2. No acute displaced fracture or traumatic listhesis of the cervical spine. 3.  Aortic Atherosclerosis (ICD10-I70.0). Electronically Signed   By: Tish Frederickson M.D.   On: 05/31/2023 18:34   DG Femur Min 2 Views Right  Result Date: 05/31/2023 CLINICAL DATA:  trauma EXAM: RIGHT FEMUR 2 VIEWS COMPARISON:  X-ray pelvis/right hip 05/31/2023 FINDINGS: Age-indeterminate, likely acute, superior right pubic rami fracture. There is no evidence of fracture or other focal bone lesions of the right acetabulum or femur. Soft tissues are unremarkable. Vascular calcifications. Severe degenerative changes of the tibiofemoral joint. IMPRESSION: Age indeterminate, likely acute, displaced superior right pubic rami fracture. Electronically Signed   By: Tish Frederickson M.D.   On: 05/31/2023 18:17   DG Pelvis Portable  Result Date: 05/31/2023 CLINICAL DATA:  Abdominal x-ray EXAM: PORTABLE PELVIS 1-2 VIEWS COMPARISON:  CT abdomen pelvis 03/29/2019  FINDINGS: Limited evaluation due to overlapping osseous structures and overlying soft tissues. Mild degenerative changes of the right hip. Likely right superior pubic rami fracture. There is no evidence of pelvic fracture or diastasis. No pelvic bone lesions are seen. Degenerative changes of the visualized lower lumbar spine. Lucency along the left transverse process of L5 level. Calcifications along the midline lower abdomen at the level of the L4-L5 level correlates with findings on the CT abdomen pelvis 03/29/2019. IMPRESSION: 1. Likely right superior pubic rami fracture. Limited evaluation due to overlapping osseous structures and overlying soft tissues. Limited evaluation on single view radiograph. 2. Lucency along the left transverse process of L5 level. Possible nondisplaced fracture. Electronically Signed   By: Tish Frederickson M.D.   On: 05/31/2023 18:15   DG Chest Portable 1 View  Result Date: 05/31/2023 CLINICAL DATA:  trauma EXAM: PORTABLE CHEST 1 VIEW COMPARISON:  Chest x-ray  09/04/2019 FINDINGS: Loop catheter overlies the upper mediastinum. The heart and mediastinal contours are unchanged. Aortic calcification. No focal consolidation. No pulmonary edema. No pleural effusion. No pneumothorax. No acute osseous abnormality. Severe degenerative changes of the left shoulder. IMPRESSION: 1. No active disease. 2.  Aortic Atherosclerosis (ICD10-I70.0). Electronically Signed   By: Tish Frederickson M.D.   On: 05/31/2023 18:10    EKG: Independently reviewed. See above  Assessment/Plan  Displaced superior right pubic rami fracture -s/p mechanical fall -mild pelvic hematoma -admit for pain control and physical therapy  -per ortho non-op ( Dr Victorino Dike, will see in am )   Mild pelvic hematoma -monitor h/h  - repeat inr  -start heparin w/o bolus once inr less than 2   Dementia, Alzheimer's Progressive debility -family requesting discharge to higher level of care  Due to progressive cognitive  decline as well as progressive debility  -resume aricept  Mechanical Aortic valve  - on warfarin , inr 2.2 -hold for now due to hematoma -start heparin drip w/o bolus once inr less than 2 -repeat pending for am   Atrial fibrillation  -continue on warfarin    Hx of CVA   Hx of hemorrhagic CVA 10/20 -stable on warfarin   DJD lumbar spine -hx of stress fracture - noted on xray today Lucency along the left transverse process of L5 level.  -supportive care   Hypocalcemia -check vit D -replete prn   DVT prophylaxis: on warfarin Code Status: full/ as discussed per patient wishes in event of cardiac arrest  Family Communication: at bedside Disposition Plan: patient  expected to be admitted greater than 2 midnights  Consults called:Ortho dr Victorino Dike Admission status: med/tele   Lurline Del MD [facility]   If 7PM-7AM, please contact night-coverage [email or web address] Password [facility]  05/31/2023, 8:43 PM

## 2023-05-31 NOTE — ED Notes (Signed)
[facility] BIB [facility], unwitnessed fall x5hr down time. Tender right femur, stable pelvis. Neg stroke. A&Ox1. 140/80, CBG 144, 90, 98.1.

## 2023-05-31 NOTE — ED Notes (Signed)
ED TO INPATIENT HANDOFF REPORT  ED Nurse Name and Phone #:   S Name/Age/Gender Virginia Franklin 89 y.o. female Room/Bed: 006C/006C  Code Status   Code Status: Full Code  Home/SNF/Other Nursing Home Patient oriented to: self, place, time, and situation Is this baseline? Yes   Triage Complete: Triage complete  Chief Complaint Pelvic fracture (HCC) [S32.9XXA]  Triage Note [facility], unwitnessed fall x5hr down time. Tender right femur, stable pelvis. Neg stroke. A&Ox1. 140/80, CBG 144, 90, 98.1.    Allergies Allergies  Allergen Reactions   Penicillins Anaphylaxis and Swelling    Has patient had a PCN reaction causing immediate rash, facial/tongue/throat swelling, SOB or lightheadedness with hypotension: Yes Has patient had a PCN reaction causing severe rash involving mucus membranes or skin necrosis: No Has patient had a PCN reaction that required hospitalization: No Has patient had a PCN reaction occurring within the last 10 years: No If all of the above answers are "NO", then may proceed with Cephalosporin use.    Level of Care/Admitting Diagnosis ED Disposition     ED Disposition  Admit   Condition  --   Comment  Hospital Area: [facility] [[id number]]  Level of Care: Telemetry Medical [104]  May admit patient to [facility] or [facility] if equivalent level of care is available:: No  Covid Evaluation: Asymptomatic - no recent exposure (last 10 days) testing not required  Diagnosis: Pelvic fracture [facility]) [[id number]]  Admitting Physician: Lurline Del [[id number]]  Attending Physician: Lurline Del [[id number]]  Certification:: I certify this patient will need inpatient services for at least 2 midnights  Estimated Length of Stay: 3          B Medical/Surgery History Past Medical History:  Diagnosis Date   Alzheimer's dementia (HCC) 2017   Arthritis    OA   Body mass index (BMI) of 22.0-22.9 in adult     Cervical cancer (HCC)    had hysterctomy for   H/O osteoporosis    H/O: CVA (cerebrovascular accident) 2012   , HEMORRHAGIC 09-04-2019 ALSO   H/O: osteoarthritis    Hammertoe of left foot    History of urinary frequency    Hx of diverticulitis of colon    Hypertension    Lumbar stress fracture lL3, 01-2020   Past Surgical History:  Procedure Laterality Date   ABDOMINAL HYSTERECTOMY     CATARACT EXTRACTION Bilateral    HAMMER TOE SURGERY Left 07/29/2020   Procedure: HAMMER TOE REPAIR 2ND TOE LEFT;  Surgeon: Edwin Cap, DPM;  Location: [facility];  Service: Podiatry;  Laterality: Left;   INCISION AND DRAINAGE Bilateral 07/29/2020   Procedure: EXCISION OF WOUND OF LEFT SECOND TOE; RELEASE OF TENDONS LEFT THIRD TOE, RIGHT THIRD TOE;  Surgeon: Edwin Cap, DPM;  Location: [facility];  Service: Podiatry;  Laterality: Bilateral;   mechanical heart valve  1990   [facility] done [place]   SMALL INTESTINE SURGERY       A IV Location/Drains/Wounds Patient Lines/Drains/Airways Status     Active Line/Drains/Airways     Name Placement date Placement time Site Days   External Urinary Catheter 05/31/23  1932  --  less than 1   Incision (Closed) 07/29/20 Foot Left 07/29/20  1009  -- 1036   Incision (Closed) 07/29/20 Foot Right 07/29/20  1010  -- 1036            Intake/Output Last 24 hours  No intake or output data in the 24 hours ending 05/31/23 2124  Labs/Imaging Results for orders placed or performed during the hospital encounter of 05/31/23 (from the past 48 hour(s))  Basic metabolic panel     Status: Abnormal   Collection Time: 05/31/23  6:13 PM  Result Value Ref Range   Sodium 136 135 - 145 mmol/L   Potassium 3.5 3.5 - 5.1 mmol/L   Chloride 104 98 - 111 mmol/L   CO2 22 22 - 32 mmol/L   Glucose, Bld 145 (H) 70 - 99 mg/dL    Comment: Glucose reference range applies only to samples taken after fasting for at least 8 hours.   BUN 16 8  - 23 mg/dL   Creatinine, Ser 6.21 0.44 - 1.00 mg/dL   Calcium 9.2 8.9 - 30.8 mg/dL   GFR, Estimated >65 >78 mL/min    Comment: (NOTE) Calculated using the CKD-EPI Creatinine Equation (2021)    Anion gap 10 5 - 15    Comment: Performed at [facility], [address], [place], [place] [address]  CBC     Status: Abnormal   Collection Time: 05/31/23  6:13 PM  Result Value Ref Range   WBC 4.6 4.0 - 10.5 K/uL   RBC 3.99 3.87 - 5.11 MIL/uL   Hemoglobin 12.6 12.0 - 15.0 g/dL   HCT 95.2 84.1 - 32.4 %   MCV 96.5 80.0 - 100.0 fL   MCH 31.6 26.0 - 34.0 pg   MCHC 32.7 30.0 - 36.0 g/dL   RDW 40.1 02.7 - 25.3 %   Platelets 142 (L) 150 - 400 K/uL    Comment: REPEATED TO VERIFY   nRBC 0.0 0.0 - 0.2 %    Comment: Performed at [facility], [address], [place], [place] [address]  Protime-INR     Status: Abnormal   Collection Time: 05/31/23  6:13 PM  Result Value Ref Range   Prothrombin Time 24.5 (H) 11.4 - 15.2 seconds   INR 2.2 (H) 0.8 - 1.2    Comment: (NOTE) INR goal varies based on device and disease states. Performed at [facility], [address], [place], [place] [address]   I-stat chem 8, ed     Status: Abnormal   Collection Time: 05/31/23  6:13 PM  Result Value Ref Range   Sodium 138 135 - 145 mmol/L   Potassium 3.6 3.5 - 5.1 mmol/L   Chloride 102 98 - 111 mmol/L   BUN 18 8 - 23 mg/dL   Creatinine, Ser 5.95 0.44 - 1.00 mg/dL   Glucose, Bld 638 (H) 70 - 99 mg/dL    Comment: Glucose reference range applies only to samples taken after fasting for at least 8 hours.   Calcium, Ion 1.13 (L) 1.15 - 1.40 mmol/L   TCO2 22 22 - 32 mmol/L   Hemoglobin 12.9 12.0 - 15.0 g/dL   HCT 75.6 43.3 - 29.5 %   CT Head Wo Contrast  Result Date: 05/31/2023 CLINICAL DATA:  Head trauma, minor (Age >= 65y); Polytrauma, blunt EXAM: CT HEAD WITHOUT CONTRAST CT CERVICAL SPINE WITHOUT CONTRAST TECHNIQUE: Multidetector CT imaging of the head and cervical spine was performed following  the standard protocol without intravenous contrast. Multiplanar CT image reconstructions of the cervical spine were also generated. RADIATION DOSE REDUCTION: This exam was performed according to the departmental dose-optimization program which includes automated exposure control, adjustment of the mA and/or kV according to patient size and/or use of  iterative reconstruction technique. COMPARISON:  CT head 05/31/2021, CT angio head and neck 09/04/2019 FINDINGS: CT HEAD FINDINGS Brain: Cerebral ventricle sizes are concordant with the degree of cerebral volume loss. Patchy and confluent areas of decreased attenuation are noted throughout the deep and periventricular white matter of the cerebral hemispheres bilaterally, compatible with chronic microvascular ischemic disease. No evidence of large-territorial acute infarction. No parenchymal hemorrhage. No mass lesion. No extra-axial collection. No mass effect or midline shift. No hydrocephalus. Basilar cisterns are patent. Vascular: No hyperdense vessel. Atherosclerotic calcifications are present within the cavernous internal carotid and vertebral arteries. Skull: No acute fracture or focal lesion. Sinuses/Orbits: Paranasal sinuses and mastoid air cells are clear. Bilateral lens replacement. Otherwise the orbits are unremarkable. Other: None. CT CERVICAL SPINE FINDINGS Alignment: Grade 1 anterolisthesis of C2 on C3. Skull base and vertebrae: Multilevel at least moderate degenerative changes spine. No associated severe osseous neural foraminal or central canal stenosis. No acute fracture. No aggressive appearing focal osseous lesion or focal pathologic process. Soft tissues and spinal canal: No prevertebral fluid or swelling. No visible canal hematoma. Upper chest: Unremarkable. Other: Atherosclerotic plaque of the aortic arch and its main branches. IMPRESSION: 1. No acute intracranial abnormality. 2. No acute displaced fracture or traumatic listhesis of the cervical  spine. 3.  Aortic Atherosclerosis (ICD10-I70.0). Electronically Signed   By: Tish Frederickson M.D.   On: 05/31/2023 18:34   CT Cervical Spine Wo Contrast  Result Date: 05/31/2023 CLINICAL DATA:  Head trauma, minor (Age >= 65y); Polytrauma, blunt EXAM: CT HEAD WITHOUT CONTRAST CT CERVICAL SPINE WITHOUT CONTRAST TECHNIQUE: Multidetector CT imaging of the head and cervical spine was performed following the standard protocol without intravenous contrast. Multiplanar CT image reconstructions of the cervical spine were also generated. RADIATION DOSE REDUCTION: This exam was performed according to the departmental dose-optimization program which includes automated exposure control, adjustment of the mA and/or kV according to patient size and/or use of iterative reconstruction technique. COMPARISON:  CT head 05/31/2021, CT angio head and neck 09/04/2019 FINDINGS: CT HEAD FINDINGS Brain: Cerebral ventricle sizes are concordant with the degree of cerebral volume loss. Patchy and confluent areas of decreased attenuation are noted throughout the deep and periventricular white matter of the cerebral hemispheres bilaterally, compatible with chronic microvascular ischemic disease. No evidence of large-territorial acute infarction. No parenchymal hemorrhage. No mass lesion. No extra-axial collection. No mass effect or midline shift. No hydrocephalus. Basilar cisterns are patent. Vascular: No hyperdense vessel. Atherosclerotic calcifications are present within the cavernous internal carotid and vertebral arteries. Skull: No acute fracture or focal lesion. Sinuses/Orbits: Paranasal sinuses and mastoid air cells are clear. Bilateral lens replacement. Otherwise the orbits are unremarkable. Other: None. CT CERVICAL SPINE FINDINGS Alignment: Grade 1 anterolisthesis of C2 on C3. Skull base and vertebrae: Multilevel at least moderate degenerative changes spine. No associated severe osseous neural foraminal or central canal stenosis. No  acute fracture. No aggressive appearing focal osseous lesion or focal pathologic process. Soft tissues and spinal canal: No prevertebral fluid or swelling. No visible canal hematoma. Upper chest: Unremarkable. Other: Atherosclerotic plaque of the aortic arch and its main branches. IMPRESSION: 1. No acute intracranial abnormality. 2. No acute displaced fracture or traumatic listhesis of the cervical spine. 3.  Aortic Atherosclerosis (ICD10-I70.0). Electronically Signed   By: Tish Frederickson M.D.   On: 05/31/2023 18:34   DG Femur Min 2 Views Right  Result Date: 05/31/2023 CLINICAL DATA:  trauma EXAM: RIGHT FEMUR 2 VIEWS COMPARISON:  X-ray pelvis/right hip 05/31/2023 FINDINGS: Age-indeterminate,  likely acute, superior right pubic rami fracture. There is no evidence of fracture or other focal bone lesions of the right acetabulum or femur. Soft tissues are unremarkable. Vascular calcifications. Severe degenerative changes of the tibiofemoral joint. IMPRESSION: Age indeterminate, likely acute, displaced superior right pubic rami fracture. Electronically Signed   By: Tish Frederickson M.D.   On: 05/31/2023 18:17   DG Pelvis Portable  Result Date: 05/31/2023 CLINICAL DATA:  Abdominal x-ray EXAM: PORTABLE PELVIS 1-2 VIEWS COMPARISON:  CT abdomen pelvis 03/29/2019 FINDINGS: Limited evaluation due to overlapping osseous structures and overlying soft tissues. Mild degenerative changes of the right hip. Likely right superior pubic rami fracture. There is no evidence of pelvic fracture or diastasis. No pelvic bone lesions are seen. Degenerative changes of the visualized lower lumbar spine. Lucency along the left transverse process of L5 level. Calcifications along the midline lower abdomen at the level of the L4-L5 level correlates with findings on the CT abdomen pelvis 03/29/2019. IMPRESSION: 1. Likely right superior pubic rami fracture. Limited evaluation due to overlapping osseous structures and overlying soft tissues.  Limited evaluation on single view radiograph. 2. Lucency along the left transverse process of L5 level. Possible nondisplaced fracture. Electronically Signed   By: Tish Frederickson M.D.   On: 05/31/2023 18:15   DG Chest Portable 1 View  Result Date: 05/31/2023 CLINICAL DATA:  trauma EXAM: PORTABLE CHEST 1 VIEW COMPARISON:  Chest x-ray 09/04/2019 FINDINGS: Loop catheter overlies the upper mediastinum. The heart and mediastinal contours are unchanged. Aortic calcification. No focal consolidation. No pulmonary edema. No pleural effusion. No pneumothorax. No acute osseous abnormality. Severe degenerative changes of the left shoulder. IMPRESSION: 1. No active disease. 2.  Aortic Atherosclerosis (ICD10-I70.0). Electronically Signed   By: Tish Frederickson M.D.   On: 05/31/2023 18:10    Pending Labs Unresulted Labs (From admission, onward)     Start     Ordered   05/31/23 2116  Urinalysis, Complete w Microscopic -Urine, Clean Catch  Once,   R       Question:  Specimen Source  Answer:  Urine, Clean Catch   05/31/23 2117            Vitals/Pain Today's Vitals   05/31/23 1923 05/31/23 1930 05/31/23 1945 05/31/23 2000  BP:  (!) 155/89 (!) 160/126 92/64  Pulse:  (!) 128 (!) 47 76  Resp:  (!) 29 (!) 23 18  Temp:      SpO2: 95% 96%  90%  PainSc:        Isolation Precautions No active isolations  Medications Medications  HYDROcodone-acetaminophen (NORCO/VICODIN) 5-325 MG per tablet 1-2 tablet (has no administration in time range)  morphine (PF) 2 MG/ML injection 0.5 mg (has no administration in time range)  methocarbamol (ROBAXIN) tablet 500 mg (has no administration in time range)    Or  methocarbamol (ROBAXIN) 500 mg in dextrose 5 % 50 mL IVPB (has no administration in time range)    Mobility non-ambulatory     Focused Assessments    R Recommendations: See Admitting Provider Note  Report given to:   Additional Notes:

## 2023-05-31 NOTE — Progress Notes (Signed)
Orthopedic Tech Progress Note Patient Details:  Virginia Franklin Virginia Franklin [birth year] [id number]  Patient ID: Virginia Franklin, female   DOB: October 30, 1929, 89 y.o.   MRN: [id number] Level II; not currently needed. Darleen Crocker 05/31/2023, 6:11 PM

## 2023-05-31 NOTE — Progress Notes (Signed)
Patient ID: Virginia Franklin, female   DOB: 03-19-1929, 89 y.o.   MRN: [id number]  89 y/o female with PMH of dementia and a fib on coumadin fell earlier today sustaining a nondisplaced R superior pubic ramus fall.  SHe can bear weight as tolerated.  Pain control.  OK to continue anticoagulation.  Diet per admitting team.  No need to keep her NPO.  Full consult note to follow in AM.

## 2023-05-31 NOTE — ED Triage Notes (Signed)
atient BIB [facility], unwitnessed fall x5hr down time. Tender right femur, stable pelvis. Neg stroke. A&Ox1. 140/80, CBG 144, 90, 98.1.

## 2023-06-01 ENCOUNTER — Inpatient Hospital Stay (HOSPITAL_COMMUNITY): Payer: Medicare HMO

## 2023-06-01 DIAGNOSIS — F0280 Dementia in other diseases classified elsewhere without behavioral disturbance: Secondary | ICD-10-CM | POA: Diagnosis not present

## 2023-06-01 DIAGNOSIS — I482 Chronic atrial fibrillation, unspecified: Secondary | ICD-10-CM

## 2023-06-01 DIAGNOSIS — G309 Alzheimer's disease, unspecified: Secondary | ICD-10-CM | POA: Diagnosis not present

## 2023-06-01 DIAGNOSIS — S32501A Unspecified fracture of right pubis, initial encounter for closed fracture: Secondary | ICD-10-CM | POA: Diagnosis not present

## 2023-06-01 LAB — BASIC METABOLIC PANEL
Anion gap: 12 (ref 5–15)
BUN: 22 mg/dL (ref 8–23)
CO2: 23 mmol/L (ref 22–32)
Calcium: 8.8 mg/dL — ABNORMAL LOW (ref 8.9–10.3)
Chloride: 102 mmol/L (ref 98–111)
Creatinine, Ser: 0.98 mg/dL (ref 0.44–1.00)
GFR, Estimated: 54 mL/min — ABNORMAL LOW (ref >60)
Glucose, Bld: 139 mg/dL — ABNORMAL HIGH (ref 70–99)
Potassium: 3.6 mmol/L (ref 3.5–5.1)
Sodium: 137 mmol/L (ref 135–145)

## 2023-06-01 LAB — CBC
HCT: 34 % — ABNORMAL LOW (ref 36.0–46.0)
Hemoglobin: 11.3 g/dL — ABNORMAL LOW (ref 12.0–15.0)
MCH: 31.9 pg (ref 26.0–34.0)
MCHC: 33.2 g/dL (ref 30.0–36.0)
MCV: 96 fL (ref 80.0–100.0)
Platelets: 142 10*3/uL — ABNORMAL LOW (ref 150–400)
RBC: 3.54 MIL/uL — ABNORMAL LOW (ref 3.87–5.11)
RDW: 14.6 % (ref 11.5–15.5)
WBC: 3.5 10*3/uL — ABNORMAL LOW (ref 4.0–10.5)
nRBC: 0 % (ref 0.0–0.2)

## 2023-06-01 LAB — PROTIME-INR
INR: 2.2 — ABNORMAL HIGH (ref 0.8–1.2)
Prothrombin Time: 24.4 seconds — ABNORMAL HIGH (ref 11.4–15.2)

## 2023-06-01 LAB — MAGNESIUM: Magnesium: 1.9 mg/dL (ref 1.7–2.4)

## 2023-06-01 LAB — VITAMIN D 25 HYDROXY (VIT D DEFICIENCY, FRACTURES): Vit D, 25-Hydroxy: 60.85 ng/mL (ref 30–100)

## 2023-06-01 MED ORDER — OXYBUTYNIN CHLORIDE ER 5 MG PO TB24
5.0000 mg | ORAL_TABLET | Freq: Every day | ORAL | Status: DC
  Administered 2023-06-01 – 2023-06-03 (×2): 5 mg via ORAL
  Filled 2023-06-01 (×4): qty 1

## 2023-06-01 MED ORDER — FUROSEMIDE 20 MG PO TABS: 20.0000 mg | ORAL_TABLET | Freq: Every day | ORAL | Status: DC

## 2023-06-01 MED ORDER — ESCITALOPRAM OXALATE 10 MG PO TABS
5.0000 mg | ORAL_TABLET | Freq: Every day | ORAL | Status: DC
  Administered 2023-06-01 – 2023-06-04 (×4): 5 mg via ORAL
  Filled 2023-06-01 (×4): qty 1

## 2023-06-01 MED ORDER — DONEPEZIL HCL 5 MG PO TABS
5.0000 mg | ORAL_TABLET | Freq: Every day | ORAL | Status: DC
  Administered 2023-06-01 – 2023-06-03 (×2): 5 mg via ORAL
  Filled 2023-06-01 (×3): qty 1

## 2023-06-01 MED ORDER — SIMVASTATIN 20 MG PO TABS
40.0000 mg | ORAL_TABLET | Freq: Every day | ORAL | Status: DC
  Administered 2023-06-01 – 2023-06-04 (×3): 40 mg via ORAL
  Filled 2023-06-01 (×4): qty 2

## 2023-06-01 MED ORDER — MEMANTINE HCL 10 MG PO TABS
10.0000 mg | ORAL_TABLET | Freq: Two times a day (BID) | ORAL | Status: DC
  Administered 2023-06-01 – 2023-06-04 (×6): 10 mg via ORAL
  Filled 2023-06-01 (×7): qty 1

## 2023-06-01 MED ORDER — EZETIMIBE 10 MG PO TABS
10.0000 mg | ORAL_TABLET | Freq: Every day | ORAL | Status: DC
  Administered 2023-06-01 – 2023-06-04 (×4): 10 mg via ORAL
  Filled 2023-06-01 (×4): qty 1

## 2023-06-01 MED ORDER — EZETIMIBE-SIMVASTATIN 10-40 MG PO TABS
1.0000 | ORAL_TABLET | Freq: Every day | ORAL | Status: DC
  Filled 2023-06-01: qty 1

## 2023-06-01 MED ORDER — LISINOPRIL 5 MG PO TABS: 5.0000 mg | ORAL_TABLET | Freq: Every day | ORAL | Status: DC

## 2023-06-01 MED ORDER — VITAMIN D (ERGOCALCIFEROL) 1.25 MG (50000 UNIT) PO CAPS: 50000.0000 [IU] | ORAL_CAPSULE | ORAL | Status: DC

## 2023-06-01 MED ORDER — ACETAMINOPHEN 325 MG PO TABS
650.0000 mg | ORAL_TABLET | Freq: Four times a day (QID) | ORAL | Status: DC | PRN
  Administered 2023-06-01 – 2023-06-03 (×4): 650 mg via ORAL
  Filled 2023-06-01 (×4): qty 2

## 2023-06-01 NOTE — Progress Notes (Signed)
Physical Therapy Evaluation Patient Details Name: Virginia Franklin MRN: [id number] DOB: [birth year] Today's Date: 06/01/2023  History of Present Illness  89 y.o. female who presents to ED from [facility] 7/19 s/p fall out off bed today with resulting pelvic pain and R ankle tenderness. Per family she had fall after am med past and was on floor for a few hours.Imaging revealed displaced superior right pubic rami fracture,  IEP:[id number]'Y dementia, Arthritis, OA, CVA ischemic, hemorrhagic CVA 09-04-19 Hypertension, DJD of lumbar spine( hx of stress fracture),Atrial fibrillation, mechanical AVR secondary to rheumatic heart disease on warfarin  Clinical Impression  PTA pt living at [facility], where facility provided medication management and meals. Pt able to perform basic ADLs and ambulates household distances with Rollator, although granddaughter reports she often forgets to use the Rollator. Pt is limited in safe mobility by decreased cognition resultant from Alzheimer's disease which impacts her safety awareness and memory to use her AD, in presence of new R LE pain in hip from fx as well as knee and ankle (imaging unable to obtain due to inability to properly position pt due to pain), in presence of underlying decreased UE ROM and decreased strength. Pt requires maxAx2 for bed level activity with maximal optimization of bed functions and standing to Carmi mobility equipment from extremely elevated bed surface.  Patient will benefit from continued inpatient follow up therapy, <3 hours/day. PT will continue to follow acutely.             06/01/23 1000  PT Visit Information  Last PT Received On 06/01/23  Assistance Needed +2  PT/OT/SLP Co-Evaluation/Treatment Yes  Reason for Co-Treatment Complexity of the patient's impairments (multi-system involvement);Necessary to address cognition/behavior during functional activity;For patient/therapist safety  PT goals addressed during session  Mobility/safety with mobility  History of Present Illness 89 y.o. female who presents to ED from [facility] 7/19 s/p fall out off bed today with resulting pelvic pain and R ankle tenderness. Per family she had fall after am med past and was on floor for a few hours.Imaging revealed displaced superior right pubic rami fracture,  SAY:[id number]'A dementia, Arthritis, OA, CVA ischemic, hemorrhagic CVA 09-04-19 Hypertension, DJD of lumbar spine( hx of stress fracture),Atrial fibrillation, mechanical AVR secondary to rheumatic heart disease on warfarin  Precautions  Precautions Fall  Precaution Comments hospitalization result of fall  Restrictions  Weight Bearing Restrictions Yes  Home Living  Family/patient expects to be discharged to: Other (Comment)  Living Arrangements Non-relatives/Friends  Available Help at Discharge Family (Pt has a daughter that lives near by that would assist some and aide for lunch and meds.)  Type of Home Independent living facility  Home Access Elevator  Home Layout One level  Bathroom Shower/Tub Walk-in Pension scheme manager Yes  Home Equipment Rollator (4 wheels);Shower seat  Additional Comments ILF at [facility] with some caregiver assist for medication reminders. Meals are provided in dinning area but pt has to ambulate to retrieve. Aide was preparing lunch and friend was attempting to get pt go to dinner.  Prior Function  Prior Level of Function  Needs assist  Mobility Comments ambulates with Rollator but often forgets to use it  ADLs Comments facility manages her medications and her meals  Communication  Communication HOH  Pain Assessment  Pain Assessment Faces  Faces Pain Scale 6  Pain Location R hip and LE with movement and weightbearing  Pain Descriptors / Indicators Grimacing;Guarding;Discomfort  Pain Intervention(s) Limited activity within  patient's tolerance;Monitored during session;Repositioned  Cognition   Arousal/Alertness Awake/alert  Behavior During Therapy WFL for tasks assessed/performed  Overall Cognitive Status History of cognitive impairments - at baseline  General Comments no clear memory of what happened to result in fall, poor memory at baseline  Upper Extremity Assessment  Upper Extremity Assessment Defer to OT evaluation  Lower Extremity Assessment  Lower Extremity Assessment RLE deficits/detail;LLE deficits/detail  RLE Deficits / Details limited by pain but with AAROM able to facilitate movement at ankle hip and knee  LLE Deficits / Details AROM functional but not full range, strength grossly 3+/4  Cervical / Trunk Assessment  Cervical / Trunk Assessment Other exceptions;Kyphotic  Cervical / Trunk Exceptions chronic back pain  Bed Mobility  Overal bed mobility Needs Assistance  Bed Mobility Supine to Sit  Supine to sit Max assist;+2 for physical assistance;HOB elevated  General bed mobility comments with HoB maximally elevated and maximal multimodal cuing for reaching to bed rail to help pull toward EoB, able to manage LE to EoB but requires maxA and use of bed pad to pivot hips and lower LE to floor  Transfers  Overall transfer level Needs assistance  Equipment used Ambulation equipment used  Transfers Sit to/from Stand  Sit to Stand From elevated surface;Max assist;+2 physical assistance  General transfer comment from very elevated bed surface, pt requires maximal multimodal cuing and max Ax2 for assistance to manage LE to foot pad, UE reach to bar to pull up, and for powerup to standing for pad placement to transfer to chair, pt requires assist to stand from elevated pads and for slowed descent into chair  Ambulation/Gait  General Gait Details unable at this time due to pain  Balance  Overall balance assessment Needs assistance  Sitting-balance support Feet supported;Bilateral upper extremity supported  Sitting balance-Leahy Scale Poor  Sitting balance - Comments pt  requires posterior support initially able to progress to short bout of unsupported sitting before transferring to standing in Stedy  Standing balance support Bilateral upper extremity supported;During functional activity;Reliant on assistive device for balance  Standing balance-Leahy Scale Zero  Standing balance comment require UE and outside support to maintain standing balance with Stedy tranfer to recliner  General Comments  General comments (skin integrity, edema, etc.) VSS on RA, granddaughter present provided PLOF and home setup  PT - End of Session  Activity Tolerance Patient limited by pain  Patient left in chair;with call bell/phone within reach;with chair alarm set  Nurse Communication Mobility status;Other (comment) (protein shake and possible low level analgesic (Tylenol, Advil))  PT Assessment  PT Recommendation/Assessment Patient needs continued PT services  PT Visit Diagnosis Unsteadiness on feet (R26.81);Other abnormalities of gait and mobility (R26.89);Muscle weakness (generalized) (M62.81);History of falling (Z91.81);Difficulty in walking, not elsewhere classified (R26.2);Pain  Pain - Right/Left Right  Pain - part of body Hip;Knee;Ankle and joints of foot  PT Problem List Decreased strength;Decreased range of motion;Decreased activity tolerance;Decreased balance;Decreased mobility;Decreased coordination;Decreased cognition;Decreased safety awareness;Pain  PT Plan  PT Frequency (ACUTE ONLY) Min 3X/week  PT Treatment/Interventions (ACUTE ONLY) DME instruction;Gait training;Functional mobility training;Therapeutic activities;Therapeutic exercise;Balance training;Cognitive remediation;Patient/family education  AM-PAC PT "6 Clicks" Mobility Outcome Measure (Version 2)  Help needed turning from your back to your side while in a flat bed without using bedrails? 2  Help needed moving from lying on your back to sitting on the side of a flat bed without using bedrails? 1  Help needed  moving to and from a bed to a chair (including a wheelchair)?  1  Help needed standing up from a chair using your arms (e.g., wheelchair or bedside chair)? 1  Help needed to walk in hospital room? 1  Help needed climbing 3-5 steps with a railing?  1  6 Click Score 7  Consider Recommendation of Discharge To: CIR/SNF/LTACH  Progressive Mobility  What is the highest level of mobility based on the progressive mobility assessment? Level 3 (Stands with assist) - Balance while standing  and cannot march in place  Mobility Referral Yes  Activity Transferred from bed to chair  PT Recommendation  Follow Up Recommendations Skilled nursing-short term rehab (<3 hours/day)  Can patient physically be transported by private vehicle No  Assistance recommended at discharge Frequent or constant Supervision/Assistance  Patient can return home with the following Two people to help with walking and/or transfers;Two people to help with bathing/dressing/bathroom;Assistance with cooking/housework;Assistance with feeding;Direct supervision/assist for medications management;Assist for transportation;Direct supervision/assist for financial management;Help with stairs or ramp for entrance  Functional Status Assessment Patient has had a recent decline in their functional status and demonstrates the ability to make significant improvements in function in a reasonable and predictable amount of time.  PT equipment None recommended by PT  Individuals Consulted  Consulted and Agree with Results and Recommendations Patient;Family member/caregiver  Family Member Consulted granddaughter  Acute Rehab PT Goals  Patient Stated Goal go home  PT Goal Formulation With patient/family  Time For Goal Achievement 06/15/23  Potential to Achieve Goals Fair  PT Time Calculation  PT Start Time (ACUTE ONLY) 0907  PT Stop Time (ACUTE ONLY) 0951  PT Time Calculation (min) (ACUTE ONLY) 44 min  PT General Charges  $$ ACUTE PT VISIT 1 Visit   PT Evaluation  $PT Eval Moderate Complexity 1 Mod  PT Treatments  $Therapeutic Activity 8-22 mins  Written Expression  Dominant Hand Right     {Mackay Hanauer B. Beverely Risen PT, DPT Acute Rehabilitation Services Please use secure chat or  Call Office [phone]   Elon Alas [facility] 06/01/2023, 5:21 PM

## 2023-06-01 NOTE — Progress Notes (Signed)
Physical Therapy Treatment Patient Details Name: Virginia Franklin MRN: [id number] DOB: [birth year] Today's Date: 06/01/2023   History of Present Illness 89 y.o. female who presents to ED from [facility] 7/19 s/p fall out off bed today with resulting pelvic pain and R ankle tenderness. Per family she had fall after am med past and was on floor for a few hours.Imaging revealed displaced superior right pubic rami fracture,  WUJ:[id number]'N dementia, Arthritis, OA, CVA ischemic, hemorrhagic CVA 09-04-19 Hypertension, DJD of lumbar spine( hx of stress fracture),Atrial fibrillation, mechanical AVR secondary to rheumatic heart disease on warfarin    PT Comments   Pt sat up about 2 hours for lunch and requesting to get back to bed secondary to pain. in this session was in chair and was able to sit up for about 2 hours but now requiring assistance to go back to bed due to pain level. Pt required max - total x 2-3 person assist from coming from a lower position. Pt then at bed level required max-total x2 for bed mobility. Pt was unable to report pain levels but was crying with mobility at this time and nursing was made aware. Therapy made pt comfortable in the bed and by the time RN was in room pt had decreased memory of moving back to bed and associated pain. Pt daughter present throughout session and able to provide information about PLOF and recent decrease in memory and reduced eating. Even with the encouragement of her gentleman friend. The patient's daughter was educated about different rehabilitation options at this time. However, patient will benefit from continued inpatient follow up therapy, <3 hours/day. PT will continue to follow acutely.      Assistance Recommended at Discharge Frequent or constant Supervision/Assistance  If plan is discharge home, recommend the following:  Can travel by private vehicle    Two people to help with walking and/or transfers;Two people to help with  bathing/dressing/bathroom;Assistance with cooking/housework;Assistance with feeding;Direct supervision/assist for medications management;Assist for transportation;Direct supervision/assist for financial management;Help with stairs or ramp for entrance   No  Equipment Recommendations  None recommended by PT       Precautions / Restrictions Precautions Precautions: Fall Precaution Comments: hospitalization result of fall Restrictions Weight Bearing Restrictions: No     Mobility  Bed Mobility Overal bed mobility: Needs Assistance Bed Mobility: Sit to Supine     Supine to sit: Max assist, +2 for physical assistance, HOB elevated Sit to supine: Max assist, Total assist, +2 for physical assistance, +2 for safety/equipment, HOB elevated   General bed mobility comments: Pt presented in chair and was able to reamin for about 2 hours but now was in a lot of pain and needed relief. Pt required max-total for sitting to supine.    Transfers Overall transfer level: Needs assistance Equipment used: Ambulation equipment used Transfers: Sit to/from Stand Sit to Stand: Max assist, Total assist, +2 physical assistance, +2 safety/equipment (steady)           General transfer comment: Pt was in chair and required max-total 2-3 assist as due to increase in pain at this time to stand using Steady. Pt noted to have increase difficultiy with posture and needed increase in support at pelvis to stand to clear steady for transfer.    Ambulation/Gait               General Gait Details: unable at this time         Balance Overall balance assessment: Needs assistance Sitting-balance support: Feet  supported, Bilateral upper extremity supported Sitting balance-Leahy Scale: Poor Sitting balance - Comments: Pt required to go into supine as soon as EOB due to pain Postural control: Posterior lean, Right lateral lean, Left lateral lean Standing balance support: Bilateral upper extremity  supported, During functional activity, Reliant on assistive device for balance Standing balance-Leahy Scale: Poor Standing balance comment: steady and max-total x 2-3                            Cognition Arousal/Alertness: Awake/alert Behavior During Therapy: WFL for tasks assessed/performed Overall Cognitive Status: History of cognitive impairments - at baseline                                 General Comments: no clear memory of what happened to result in fall, poor memory at baseline           General Comments General comments (skin integrity, edema, etc.): VSS on RA,daughter present provided education on healing time, and need for mobilization to keep level of independence      Pertinent Vitals/Pain Pain Assessment Pain Assessment: Faces Faces Pain Scale: Hurts worst Breathing: normal Negative Vocalization: occasional moan/groan, low speech, negative/disapproving quality Facial Expression: sad, frightened, frown Body Language: tense, distressed pacing, fidgeting Consolability: distracted or reassured by voice/touch PAINAD Score: 4 Pain Location: R hip and LE with movement and weightbearing Pain Descriptors / Indicators: Grimacing, Guarding, Discomfort Pain Intervention(s): Limited activity within patient's tolerance     PT Goals (current goals can now be found in the care plan section) Acute Rehab PT Goals PT Goal Formulation: With patient/family Time For Goal Achievement: 06/15/23 Potential to Achieve Goals: Fair Progress towards PT goals: Not progressing toward goals - comment    Frequency    Min 3X/week      PT Plan  Current plan remains appropriate    Co-evaluation PT/OT/SLP Co-Evaluation/Treatment: Yes Reason for Co-Treatment: Complexity of the patient's impairments (multi-system involvement);Necessary to address cognition/behavior during functional activity;For patient/therapist safety   OT goals addressed during session:  ADL's and self-care      AM-PAC PT "6 Clicks" Mobility   Outcome Measure  Help needed turning from your back to your side while in a flat bed without using bedrails?: A Lot Help needed moving from lying on your back to sitting on the side of a flat bed without using bedrails?: Total Help needed moving to and from a bed to a chair (including a wheelchair)?: Total Help needed standing up from a chair using your arms (e.g., wheelchair or bedside chair)?: Total Help needed to walk in hospital room?: Total Help needed climbing 3-5 steps with a railing? : Total 6 Click Score: 7    End of Session   Activity Tolerance: Patient limited by pain Patient left: in bed;with call bell/phone within reach;with bed alarm set Nurse Communication: Mobility status PT Visit Diagnosis: Unsteadiness on feet (R26.81);Other abnormalities of gait and mobility (R26.89);Muscle weakness (generalized) (M62.81);History of falling (Z91.81);Difficulty in walking, not elsewhere classified (R26.2);Pain Pain - Right/Left: Right Pain - part of body: Hip;Knee;Ankle and joints of foot     Time: 4782-9562 PT Time Calculation (min) (ACUTE ONLY): 43 min  Charges:    $Therapeutic Activity: 8-22 mins PT General Charges $$ ACUTE PT VISIT: 1 Visit                     Jaxn Chiquito B.  Beverely Risen PT, DPT Acute Rehabilitation Services Please use secure chat or  Call Office [phone]    Elon Alas [facility] 06/01/2023, 5:33 PM

## 2023-06-01 NOTE — Progress Notes (Signed)
Occupational Therapy Treatment Patient Details Name: Virginia Franklin MRN: [id number] DOB: 05-Apr-1929 Today's Date: 06/01/2023   History of present illness 89 y.o. female who presents to ED from [facility] 7/19 s/p fall out off bed today with resulting pelvic pain and R ankle tenderness. Per family she had fall after am med past and was on floor for a few hours.Imaging revealed displaced superior right pubic rami fracture,  VHQ:[id number] dementia, Arthritis, OA, CVA ischemic, hemorrhagic CVA 09-04-19 Hypertension, DJD of lumbar spine( hx of stress fracture),Atrial fibrillation, mechanical AVR secondary to rheumatic heart disease on warfarin   OT comments  Pt had daughter Lurena Joiner) present in room at this time to be able to get more information on home support. Per daughter the patient was just needing to need increase in care as she was starting to not go to meals with her friend reminding her to go to dinner. Lurena Joiner also reported that when she was showering she would forget within the timeframe she was showering that she would wash her face until her eyes are red and needed verbal cues to stop washing her face. Pt in this session was in chair and was able to sit up for about 2 hours but now requiring assistance to go back to bed due to pain level. Pt required max - total x 2-3 person assist from coming from a lower position. Pt then at bed level required max-total x2 for bed mobility. Pt was unable to report pain levels but was crying with mobility at this time and nursing was made aware. However, when they stopped moving they said they were comfortable. The patient's daughter was educated about different rehabilitation options at this time. However, patient will benefit from continued inpatient follow up therapy, <3 hours/day    Recommendations for follow up therapy are one component of a multi-disciplinary discharge planning process, led by the attending physician.  Recommendations may be  updated based on patient status, additional functional criteria and insurance authorization.    Assistance Recommended at Discharge Frequent or constant Supervision/Assistance  Patient can return home with the following  Two people to help with walking and/or transfers;Two people to help with bathing/dressing/bathroom;Assistance with cooking/housework;Assistance with feeding;Direct supervision/assist for medications management;Direct supervision/assist for financial management;Assist for transportation   Equipment Recommendations   (TBD)    Recommendations for Other Services      Precautions / Restrictions Precautions Precautions: Fall Precaution Comments: hospitalization result of fall Restrictions Weight Bearing Restrictions: No       Mobility Bed Mobility Overal bed mobility: Needs Assistance Bed Mobility: Sit to Supine     Supine to sit: Max assist, +2 for physical assistance, HOB elevated Sit to supine: Max assist, Total assist, +2 for physical assistance, +2 for safety/equipment, HOB elevated   General bed mobility comments: Pt presented in chair and was able to reamin for about 2 hours but now was in a lot of pain and needed relief. Pt required max-total for sitting to supine.    Transfers Overall transfer level: Needs assistance Equipment used: Ambulation equipment used Transfers: Sit to/from Stand Sit to Stand: Max assist, Total assist, +2 physical assistance, +2 safety/equipment (steady)           General transfer comment: Pt was in chair and required max-total 2-3 assist as due to increase in pain at this time to stand using Steady. Pt noted to have increase difficultiy with posture and needed increase in support at pelvis to stand to clear steady for transfer.  Balance Overall balance assessment: Needs assistance Sitting-balance support: Feet supported, Bilateral upper extremity supported Sitting balance-Leahy Scale: Poor Sitting balance - Comments: Pt  required to go into supine as soon as EOB due to pain Postural control: Posterior lean, Right lateral lean, Left lateral lean Standing balance support: Bilateral upper extremity supported, During functional activity, Reliant on assistive device for balance Standing balance-Leahy Scale: Poor Standing balance comment: steady and max-total x 2-3                           ADL either performed or assessed with clinical judgement   ADL Overall ADL's : Needs assistance/impaired Eating/Feeding: Moderate assistance;Sitting   Grooming: Moderate assistance;Sitting Grooming Details (indicate cue type and reason): needs instruction and limited AROM in BUE Upper Body Bathing: Moderate assistance;Sitting;Maximal assistance   Lower Body Bathing: Total assistance;+2 for physical assistance;+2 for safety/equipment;Bed level   Upper Body Dressing : Moderate assistance;Sitting   Lower Body Dressing: Total assistance;+2 for physical assistance;+2 for safety/equipment;Bed level   Toilet Transfer:  (NT)   Toileting- Clothing Manipulation and Hygiene: Total assistance;+2 for physical assistance;+2 for safety/equipment;Bed level   Tub/ Shower Transfer:  (NT)          Extremity/Trunk Assessment Upper Extremity Assessment Upper Extremity Assessment: RUE deficits/detail;LUE deficits/detail RUE Deficits / Details: Pt noted to increase in AROM at this time but very limited due to pain and difficult to assess exact AROM but has chronic reported limitations per daughter. RUE: Unable to fully assess due to pain RUE Sensation: WNL RUE Coordination: WNL LUE Deficits / Details: noted limited AROM and strength but appears to be chronic in nature LUE: Unable to fully assess due to pain LUE Sensation: WNL LUE Coordination: WNL   Lower Extremity Assessment Lower Extremity Assessment: Defer to PT evaluation   Cervical / Trunk Assessment Cervical / Trunk Assessment: Kyphotic;Other  exceptions Cervical / Trunk Exceptions: chronic back pain    Vision       Perception     Praxis      Cognition Arousal/Alertness: Awake/alert Behavior During Therapy: WFL for tasks assessed/performed Overall Cognitive Status: History of cognitive impairments - at baseline                                 General Comments: no clear memory of what happened to result in fall, poor memory at baseline        Exercises      Shoulder Instructions       General Comments      Pertinent Vitals/ Pain       Pain Assessment Pain Assessment: Faces Faces Pain Scale: Hurts worst Breathing: normal Negative Vocalization: occasional moan/groan, low speech, negative/disapproving quality Facial Expression: sad, frightened, frown Body Language: tense, distressed pacing, fidgeting Consolability: distracted or reassured by voice/touch PAINAD Score: 4 Pain Location: R hip and LE with movement and weightbearing Pain Descriptors / Indicators: Grimacing, Guarding, Discomfort Pain Intervention(s): Limited activity within patient's tolerance, Monitored during session, Repositioned, RN gave pain meds during session, Patient requesting pain meds-RN notified  Home Living Family/patient expects to be discharged to:: Other (Comment) Living Arrangements: Non-relatives/Friends Available Help at Discharge: Family (Pt has a daughter that lives near by that would assist some and aide for lunch and meds.) Type of Home: Independent living facility Home Access: Elevator     Home Layout: One level     Bathroom Shower/Tub: Walk-in  shower   Bathroom Toilet: Standard Bathroom Accessibility: Yes How Accessible: Accessible via walker (however, very small to move walker in) Home Equipment: Rollator (4 wheels);Shower seat   Additional Comments: ILF at [facility] with some caregiver assist for medication reminders. Meals are provided in dinning area but pt has to ambulate to retrieve. Aide  was preparing lunch and friend was attempting to get pt go to dinner.      Prior Functioning/Environment              Frequency  Min 2X/week        Progress Toward Goals  OT Goals(current goals can now be found in the care plan section)  Progress towards OT goals: Progressing toward goals  Acute Rehab OT Goals Patient Stated Goal: to have less pain OT Goal Formulation: With family Time For Goal Achievement: 06/15/23 Potential to Achieve Goals: Fair ADL Goals Pt Will Perform Grooming: with set-up;sitting Pt Will Perform Upper Body Bathing: with set-up;sitting Pt Will Perform Lower Body Bathing: with max assist;sit to/from stand Pt Will Transfer to Toilet: stand pivot transfer;bedside commode Additional ADL Goal #1: Pt will tolerate sitting at EOB for 15 mins to complete ADLS  Plan Discharge plan remains appropriate    Co-evaluation    PT/OT/SLP Co-Evaluation/Treatment: Yes Reason for Co-Treatment: Complexity of the patient's impairments (multi-system involvement);Necessary to address cognition/behavior during functional activity;For patient/therapist safety   OT goals addressed during session: ADL's and self-care      AM-PAC OT "6 Clicks" Daily Activity     Outcome Measure   Help from another person eating meals?: A Lot Help from another person taking care of personal grooming?: A Little Help from another person toileting, which includes using toliet, bedpan, or urinal?: Total Help from another person bathing (including washing, rinsing, drying)?: Total Help from another person to put on and taking off regular upper body clothing?: A Lot Help from another person to put on and taking off regular lower body clothing?: Total 6 Click Score: 10    End of Session Equipment Utilized During Treatment: Gait belt  OT Visit Diagnosis: Unsteadiness on feet (R26.81);Other abnormalities of gait and mobility (R26.89);Repeated falls (R29.6);Muscle weakness (generalized)  (M62.81);Pain Pain - Right/Left: Right Pain - part of body: Hip   Activity Tolerance Patient limited by pain   Patient Left in bed;with call bell/phone within reach;with bed alarm set;with family/visitor present   Nurse Communication Mobility status        Time: 1200-1250 OT Time Calculation (min): 50 min  Charges: OT General Charges $OT Visit: 1 Visit OT Evaluation $OT Eval Moderate Complexity: 1 Mod OT Treatments $Self Care/Home Management : 23-37 mins  Presley Raddle OTR/L  Acute Rehab Services  [phone] office number   Alphia Moh 06/01/2023, 2:22 PM

## 2023-06-01 NOTE — Consult Note (Signed)
Reason for Consult:Pelvic fx Referring Physician: EDP  Virginia Franklin is an 89 y.o. female.  HPI: pt with a fall c/o pelvic pain found to have a pelvic fx. Also endorsing R ankle pain this AM. Admitted to medical service.  Past Medical History:  Diagnosis Date   Alzheimer's dementia (HCC) 2017   Arthritis    OA   Body mass index (BMI) of 22.0-22.9 in adult    Cervical cancer (HCC)    had hysterctomy for   H/O osteoporosis    H/O: CVA (cerebrovascular accident) 2012   , HEMORRHAGIC 09-04-2019 ALSO   H/O: osteoarthritis    Hammertoe of left foot    History of urinary frequency    Hx of diverticulitis of colon    Hypertension    Lumbar stress fracture lL3, 01-2020    Past Surgical History:  Procedure Laterality Date   ABDOMINAL HYSTERECTOMY     CATARACT EXTRACTION Bilateral    HAMMER TOE SURGERY Left 07/29/2020   Procedure: HAMMER TOE REPAIR 2ND TOE LEFT;  Surgeon: Edwin Cap, DPM;  Location: [facility];  Service: Podiatry;  Laterality: Left;   INCISION AND DRAINAGE Bilateral 07/29/2020   Procedure: EXCISION OF WOUND OF LEFT SECOND TOE; RELEASE OF TENDONS LEFT THIRD TOE, RIGHT THIRD TOE;  Surgeon: Edwin Cap, DPM;  Location: [facility] [place];  Service: Podiatry;  Laterality: Bilateral;   mechanical heart valve  1990   st [place] done in [place]   SMALL INTESTINE SURGERY      Family History  Problem Relation Age of Onset   Kidney disease Mother        MALIGNANT NEOPLASM   Arthritis Father    Heart Problems Father    Lung disease Brother    Heart disease Neg Hx     Social History:  reports that she has never smoked. She has never used smokeless tobacco. She reports current alcohol use. She reports that she does not use drugs.  Allergies:  Allergies  Allergen Reactions   Penicillins Anaphylaxis and Swelling    Has patient had a PCN reaction causing immediate rash, facial/tongue/throat swelling, SOB or lightheadedness with  hypotension: Yes Has patient had a PCN reaction causing severe rash involving mucus membranes or skin necrosis: No Has patient had a PCN reaction that required hospitalization: No Has patient had a PCN reaction occurring within the last 10 years: No If all of the above answers are "NO", then may proceed with Cephalosporin use.    Medications: I have reviewed the patient's current medications.  Results for orders placed or performed during the hospital encounter of 05/31/23 (from the past 48 hour(s))  Basic metabolic panel     Status: Abnormal   Collection Time: 05/31/23  6:13 PM  Result Value Ref Range   Sodium 136 135 - 145 mmol/L   Potassium 3.5 3.5 - 5.1 mmol/L   Chloride 104 98 - 111 mmol/L   CO2 22 22 - 32 mmol/L   Glucose, Bld 145 (H) 70 - 99 mg/dL    Comment: Glucose reference range applies only to samples taken after fasting for at least 8 hours.   BUN 16 8 - 23 mg/dL   Creatinine, Ser 8.29 0.44 - 1.00 mg/dL   Calcium 9.2 8.9 - 56.2 mg/dL   GFR, Estimated >13 >08 mL/min    Comment: (NOTE) Calculated using the CKD-EPI Creatinine Equation (2021)    Anion gap 10 5 - 15    Comment: Performed at  [facility], [address], [place], [place] [address]  CBC     Status: Abnormal   Collection Time: 05/31/23  6:13 PM  Result Value Ref Range   WBC 4.6 4.0 - 10.5 K/uL   RBC 3.99 3.87 - 5.11 MIL/uL   Hemoglobin 12.6 12.0 - 15.0 g/dL   HCT 13.2 44.0 - 10.2 %   MCV 96.5 80.0 - 100.0 fL   MCH 31.6 26.0 - 34.0 pg   MCHC 32.7 30.0 - 36.0 g/dL   RDW 72.5 36.6 - 44.0 %   Platelets 142 (L) 150 - 400 K/uL    Comment: REPEATED TO VERIFY   nRBC 0.0 0.0 - 0.2 %    Comment: Performed at [facility], [address] [place], [place] [address]  Protime-INR     Status: Abnormal   Collection Time: 05/31/23  6:13 PM  Result Value Ref Range   Prothrombin Time 24.5 (H) 11.4 - 15.2 seconds   INR 2.2 (H) 0.8 - 1.2    Comment: (NOTE) INR goal varies based on device and disease  states. Performed at [facility], [address], [place], [place] [address]   I-stat chem 8, ed     Status: Abnormal   Collection Time: 05/31/23  6:13 PM  Result Value Ref Range   Sodium 138 135 - 145 mmol/L   Potassium 3.6 3.5 - 5.1 mmol/L   Chloride 102 98 - 111 mmol/L   BUN 18 8 - 23 mg/dL   Creatinine, Ser 8.75 0.44 - 1.00 mg/dL   Glucose, Bld 643 (H) 70 - 99 mg/dL    Comment: Glucose reference range applies only to samples taken after fasting for at least 8 hours.   Calcium, Ion 1.13 (L) 1.15 - 1.40 mmol/L   TCO2 22 22 - 32 mmol/L   Hemoglobin 12.9 12.0 - 15.0 g/dL   HCT 32.9 51.8 - 84.1 %  Magnesium     Status: None   Collection Time: 05/31/23 11:45 PM  Result Value Ref Range   Magnesium 1.9 1.7 - 2.4 mg/dL    Comment: Performed at [facility], [address], [place], [place] [address]  VITAMIN D 25 Hydroxy (Vit-D Deficiency, Fractures)     Status: None   Collection Time: 05/31/23 11:45 PM  Result Value Ref Range   Vit D, 25-Hydroxy 60.85 30 - 100 ng/mL    Comment: (NOTE) Vitamin D deficiency has been defined by the Institute of Medicine  and an Endocrine Society practice guideline as a level of serum 25-OH  vitamin D less than 20 ng/mL (1,2). The Endocrine Society went on to  further define vitamin D insufficiency as a level between 21 and 29  ng/mL (2).  1. IOM (Institute of Medicine). 2010. Dietary reference intakes for  calcium and D. [place]: [employer]. 2. Holick MF, Binkley [place], Bischoff-Ferrari HA, et al. Evaluation,  treatment, and prevention of vitamin D deficiency: an Endocrine  Society clinical practice guideline, JCEM. 2011 Jul; 96(7): 1911-30.  Performed at [facility], [address] [place], [place] [address]   Protime-INR     Status: Abnormal   Collection Time: 06/01/23  2:44 AM  Result Value Ref Range   Prothrombin Time 24.4 (H) 11.4 - 15.2 seconds   INR 2.2 (H) 0.8 - 1.2    Comment: (NOTE) INR  goal varies based on device and disease states. Performed at [facility], [address] [place], [place] [address]   CBC     Status: Abnormal   Collection Time: 06/01/23  7:33 AM  Result Value Ref Range   WBC 3.5 (L) 4.0 - 10.5 K/uL   RBC 3.54 (L) 3.87 - 5.11 MIL/uL   Hemoglobin 11.3 (L) 12.0 - 15.0 g/dL   HCT 19.1 (L) 47.8 - 29.5 %   MCV 96.0 80.0 - 100.0 fL   MCH 31.9 26.0 - 34.0 pg   MCHC 33.2 30.0 - 36.0 g/dL   RDW 62.1 30.8 - 65.7 %   Platelets 142 (L) 150 - 400 K/uL   nRBC 0.0 0.0 - 0.2 %    Comment: Performed at [facility], [address], [place], [place] [address]  Basic metabolic panel     Status: Abnormal   Collection Time: 06/01/23  7:33 AM  Result Value Ref Range   Sodium 137 135 - 145 mmol/L   Potassium 3.6 3.5 - 5.1 mmol/L   Chloride 102 98 - 111 mmol/L   CO2 23 22 - 32 mmol/L   Glucose, Bld 139 (H) 70 - 99 mg/dL    Comment: Glucose reference range applies only to samples taken after fasting for at least 8 hours.   BUN 22 8 - 23 mg/dL   Creatinine, Ser 2.95 0.44 - 1.00 mg/dL   Calcium 8.8 (L) 8.9 - 10.3 mg/dL   GFR, Estimated 54 (L) >60 mL/min    Comment: (NOTE) Calculated using the CKD-EPI Creatinine Equation (2021)    Anion gap 12 5 - 15    Comment: Performed at [facility], [address] [place], [place] [address]    CT ABDOMEN PELVIS WO CONTRAST  Result Date: 05/31/2023 CLINICAL DATA:  Abdominal pain, acute, nonlocalized without contrast. Likely acute right superior pubic ramus fracture on earlier imaging EXAM: CT ABDOMEN AND PELVIS WITHOUT CONTRAST TECHNIQUE: Multidetector CT imaging of the abdomen and pelvis was performed following the standard protocol without IV contrast. RADIATION DOSE REDUCTION: This exam was performed according to the departmental dose-optimization program which includes automated exposure control, adjustment of the mA and/or kV according to patient size and/or use of iterative reconstruction technique.  COMPARISON:  Pelvis x-ray today.  CT 03/29/2019 FINDINGS: Lower chest: No acute abnormality. Hepatobiliary: No focal hepatic abnormality. Gallbladder unremarkable. Pancreas: No focal abnormality or ductal dilatation. Spleen: No focal abnormality.  Normal size. Adrenals/Urinary Tract: Adrenal glands unremarkable. Left adrenal cyst measures 3 cm compared to 1.2 cm previously. No hydronephrosis. Urinary bladder unremarkable. Stomach/Bowel: Left colonic diverticulosis. No active diverticulitis. Stomach and small bowel decompressed. No bowel obstruction. Vascular/Lymphatic: Diffuse aortoiliac atherosclerosis. No aneurysm calcified retroperitoneal lymph nodes are stable. Reproductive: Prior hysterectomy.  No adnexal masses. Other: No free fluid or free air. Musculoskeletal: Right superior and inferior pubic rami fractures. Surrounding hematoma in the right extraperitoneal space. Mild degenerative changes in the hips and lumbar spine. IMPRESSION: Right superior and inferior pubic rami fractures. Surrounding mild hematoma. Left colonic diverticulosis.  No active diverticulitis. 3 cm cystic lesion in the midpole of the left kidney. This likely reflects cyst, but has enlarged significantly since prior study. This could be further evaluated with elective renal ultrasound. Aortoiliac atherosclerosis. Electronically Signed   By: Charlett Nose M.D.   On: 05/31/2023 22:07   CT Head Wo Contrast  Result Date: 05/31/2023 CLINICAL DATA:  Head trauma, minor (Age >= 65y); Polytrauma, blunt EXAM: CT HEAD WITHOUT CONTRAST CT CERVICAL SPINE WITHOUT CONTRAST TECHNIQUE: Multidetector CT imaging of the  head and cervical spine was performed following the standard protocol without intravenous contrast. Multiplanar CT image reconstructions of the cervical spine were also generated. RADIATION DOSE REDUCTION: This exam was performed according to the departmental dose-optimization program which includes automated exposure control, adjustment of  the mA and/or kV according to patient size and/or use of iterative reconstruction technique. COMPARISON:  CT head 05/31/2021, CT angio head and neck 09/04/2019 FINDINGS: CT HEAD FINDINGS Brain: Cerebral ventricle sizes are concordant with the degree of cerebral volume loss. Patchy and confluent areas of decreased attenuation are noted throughout the deep and periventricular white matter of the cerebral hemispheres bilaterally, compatible with chronic microvascular ischemic disease. No evidence of large-territorial acute infarction. No parenchymal hemorrhage. No mass lesion. No extra-axial collection. No mass effect or midline shift. No hydrocephalus. Basilar cisterns are patent. Vascular: No hyperdense vessel. Atherosclerotic calcifications are present within the cavernous internal carotid and vertebral arteries. Skull: No acute fracture or focal lesion. Sinuses/Orbits: Paranasal sinuses and mastoid air cells are clear. Bilateral lens replacement. Otherwise the orbits are unremarkable. Other: None. CT CERVICAL SPINE FINDINGS Alignment: Grade 1 anterolisthesis of C2 on C3. Skull base and vertebrae: Multilevel at least moderate degenerative changes spine. No associated severe osseous neural foraminal or central canal stenosis. No acute fracture. No aggressive appearing focal osseous lesion or focal pathologic process. Soft tissues and spinal canal: No prevertebral fluid or swelling. No visible canal hematoma. Upper chest: Unremarkable. Other: Atherosclerotic plaque of the aortic arch and its main branches. IMPRESSION: 1. No acute intracranial abnormality. 2. No acute displaced fracture or traumatic listhesis of the cervical spine. 3.  Aortic Atherosclerosis (ICD10-I70.0). Electronically Signed   By: Tish Frederickson M.D.   On: 05/31/2023 18:34   CT Cervical Spine Wo Contrast  Result Date: 05/31/2023 CLINICAL DATA:  Head trauma, minor (Age >= 65y); Polytrauma, blunt EXAM: CT HEAD WITHOUT CONTRAST CT CERVICAL SPINE  WITHOUT CONTRAST TECHNIQUE: Multidetector CT imaging of the head and cervical spine was performed following the standard protocol without intravenous contrast. Multiplanar CT image reconstructions of the cervical spine were also generated. RADIATION DOSE REDUCTION: This exam was performed according to the departmental dose-optimization program which includes automated exposure control, adjustment of the mA and/or kV according to patient size and/or use of iterative reconstruction technique. COMPARISON:  CT head 05/31/2021, CT angio head and neck 09/04/2019 FINDINGS: CT HEAD FINDINGS Brain: Cerebral ventricle sizes are concordant with the degree of cerebral volume loss. Patchy and confluent areas of decreased attenuation are noted throughout the deep and periventricular white matter of the cerebral hemispheres bilaterally, compatible with chronic microvascular ischemic disease. No evidence of large-territorial acute infarction. No parenchymal hemorrhage. No mass lesion. No extra-axial collection. No mass effect or midline shift. No hydrocephalus. Basilar cisterns are patent. Vascular: No hyperdense vessel. Atherosclerotic calcifications are present within the cavernous internal carotid and vertebral arteries. Skull: No acute fracture or focal lesion. Sinuses/Orbits: Paranasal sinuses and mastoid air cells are clear. Bilateral lens replacement. Otherwise the orbits are unremarkable. Other: None. CT CERVICAL SPINE FINDINGS Alignment: Grade 1 anterolisthesis of C2 on C3. Skull base and vertebrae: Multilevel at least moderate degenerative changes spine. No associated severe osseous neural foraminal or central canal stenosis. No acute fracture. No aggressive appearing focal osseous lesion or focal pathologic process. Soft tissues and spinal canal: No prevertebral fluid or swelling. No visible canal hematoma. Upper chest: Unremarkable. Other: Atherosclerotic plaque of the aortic arch and its main branches. IMPRESSION: 1.  No acute intracranial abnormality. 2. No acute displaced  fracture or traumatic listhesis of the cervical spine. 3.  Aortic Atherosclerosis (ICD10-I70.0). Electronically Signed   By: Tish Frederickson M.D.   On: 05/31/2023 18:34   DG Femur Min 2 Views Right  Result Date: 05/31/2023 CLINICAL DATA:  trauma EXAM: RIGHT FEMUR 2 VIEWS COMPARISON:  X-ray pelvis/right hip 05/31/2023 FINDINGS: Age-indeterminate, likely acute, superior right pubic rami fracture. There is no evidence of fracture or other focal bone lesions of the right acetabulum or femur. Soft tissues are unremarkable. Vascular calcifications. Severe degenerative changes of the tibiofemoral joint. IMPRESSION: Age indeterminate, likely acute, displaced superior right pubic rami fracture. Electronically Signed   By: Tish Frederickson M.D.   On: 05/31/2023 18:17   DG Pelvis Portable  Result Date: 05/31/2023 CLINICAL DATA:  Abdominal x-ray EXAM: PORTABLE PELVIS 1-2 VIEWS COMPARISON:  CT abdomen pelvis 03/29/2019 FINDINGS: Limited evaluation due to overlapping osseous structures and overlying soft tissues. Mild degenerative changes of the right hip. Likely right superior pubic rami fracture. There is no evidence of pelvic fracture or diastasis. No pelvic bone lesions are seen. Degenerative changes of the visualized lower lumbar spine. Lucency along the left transverse process of L5 level. Calcifications along the midline lower abdomen at the level of the L4-L5 level correlates with findings on the CT abdomen pelvis 03/29/2019. IMPRESSION: 1. Likely right superior pubic rami fracture. Limited evaluation due to overlapping osseous structures and overlying soft tissues. Limited evaluation on single view radiograph. 2. Lucency along the left transverse process of L5 level. Possible nondisplaced fracture. Electronically Signed   By: Tish Frederickson M.D.   On: 05/31/2023 18:15   DG Chest Portable 1 View  Result Date: 05/31/2023 CLINICAL DATA:  trauma EXAM:  PORTABLE CHEST 1 VIEW COMPARISON:  Chest x-ray 09/04/2019 FINDINGS: Loop catheter overlies the upper mediastinum. The heart and mediastinal contours are unchanged. Aortic calcification. No focal consolidation. No pulmonary edema. No pleural effusion. No pneumothorax. No acute osseous abnormality. Severe degenerative changes of the left shoulder. IMPRESSION: 1. No active disease. 2.  Aortic Atherosclerosis (ICD10-I70.0). Electronically Signed   By: Tish Frederickson M.D.   On: 05/31/2023 18:10    Review of Systems  Constitutional: Negative.   HENT: Negative.    Eyes: Negative.   Respiratory: Negative.    Cardiovascular: Negative.   Gastrointestinal: Negative.   Endocrine: Negative.   Genitourinary: Negative.   Musculoskeletal:  Positive for arthralgias, gait problem, joint swelling and myalgias.  Skin: Negative.    Blood pressure 105/60, pulse 78, temperature 99 F (37.2 C), resp. rate 20, SpO2 95%. Physical Exam Constitutional:      Appearance: Normal appearance.  HENT:     Head: Normocephalic and atraumatic.     Right Ear: External ear normal.     Left Ear: External ear normal.     Nose: Nose normal.     Mouth/Throat:     Pharynx: Oropharynx is clear.  Eyes:     Conjunctiva/sclera: Conjunctivae normal.  Cardiovascular:     Rate and Rhythm: Normal rate and regular rhythm.     Pulses: Normal pulses.  Pulmonary:     Effort: Pulmonary effort is normal.  Abdominal:     General: Bowel sounds are normal.  Musculoskeletal:     Cervical back: Normal range of motion.     Comments: Pain with log rolling of the R hip Leg lengths even R ankle tender medial and lateral malleolus but normal ROM Sensation intact distally No calf pain or sign of DVT  Skin:    General:  Skin is warm and dry.  Neurological:     Mental Status: She is alert.     Assessment/Plan: Nondisplaced R superior pubic ramus fx R ankle pain  In regard to pelvic fx may WBAT, no surgical intervention required.  Follow up as outpt for xrays in 1 month. Will xray R ankle today  Dorothy Spark 06/01/2023, 8:25 AM

## 2023-06-01 NOTE — Progress Notes (Signed)
[facility] PROGRESS NOTE   Virginia Franklin ZOX:[id number] DOB: 1929/07/21 DOA: 05/31/2023  PCP: Nelwyn Salisbury, MD  Brief History/Interval Summary: 89 y.o. female with medical history significant of  Alzheimer's dementia, Arthritis, OA, CVA ischemic, hemorrhagic CVA 09-04-19 Hypertension, DJD of lumbar spine (hx of stress fracture), Atrial fibrillation, mechanical AVR secondary to rheumatic heart disease on warfarin who presents to ED s/p fall out off bed today with resulting pelvic pain.  She was found to have pubic rami fractures.  She was hospitalized for further management due to inability to ambulate and significant pain.  Consultants: Orthopedics  Procedures: None    Subjective/Interval History: Patient is pleasantly confused.  Does not appear to be in any discomfort.  Does complain of pain in the right lower extremity.     Assessment/Plan:  Pubic rami fracture secondary to mechanical fall/mild pelvic hematoma Seen by orthopedics.  Conservative management being pursued.  Weight-bear as tolerated.  Pain medications can be given as needed. Patient does complain of pain in the right lower extremity.  She points to her knee.  X-ray of the knee has been ordered.  X-ray of the ankle has also been ordered by orthopedics.  Normocytic anemia Slight drop in hemoglobin noted today.  Could be dilutional.  But she did have a mild hematoma.  She is on warfarin.  Will recheck labs tomorrow.  Continue to hold warfarin for now.  INR is therapeutic at 2.2.  No need to reverse at this time.  Alzheimer's dementia Has had progressive cognitive decline based on reports.  Patient has noted to be on Aricept and memantine.  Family requesting higher level of care.  PT and OT evaluation.  Mechanical aortic valve/persistent atrial fibrillation Heart rate is reasonably well-controlled.  Warfarin currently on hold.  Should be able to resume tomorrow if hemoglobin remains stable. Not noted  to be on any rate limiting drugs.  History of stroke Stable.  History of degenerative disc disease in the lumbar spine Stable.   DVT Prophylaxis: INR was therapeutic Code Status: Full code Family Communication: No family at bedside Disposition Plan: To be determined  Status is: Inpatient Remains inpatient appropriate because: Pubic rami fracture, ambulatory dysfunction, pain      Medications: Scheduled:  donepezil  5 mg Oral QHS   escitalopram  5 mg Oral Daily   ezetimibe  10 mg Oral Daily   memantine  10 mg Oral BID   oxybutynin  5 mg Oral QHS   simvastatin  40 mg Oral q1800   [START ON 06/07/2023] Vitamin D (Ergocalciferol)  50,000 Units Oral Q7 days   Continuous:  methocarbamol (ROBAXIN) IV     WJX:[id number], methocarbamol **OR** methocarbamol (ROBAXIN) IV, morphine injection  Antibiotics: Anti-infectives (From admission, onward)    None       Objective:  Vital Signs  Vitals:   05/31/23 2000 05/31/23 2230 06/01/23 0009 06/01/23 0427  BP: 92/64 (!) 173/72 (!) 99/52 105/60  Pulse: 76 63 (!) 58 78  Resp: 18 17 17 20   Temp:   98 F (36.7 C) 99 F (37.2 C)  SpO2: 90% 100% 94% 95%   No intake or output data in the 24 hours ending 06/01/23 0859 There were no vitals filed for this visit.  General appearance: Awake alert.  In no distress.  Pleasantly confused Resp: Clear to auscultation bilaterally.  Normal effort Cardio: S1-S2 is normal regular.  No S3-S4.  No rubs murmurs or bruit GI: Abdomen is soft.  Nontender nondistended.  Bowel sounds are present normal.  No masses organomegaly Extremities: Limited range of motion of both lower extremities but right more than left.  Mild swelling of the right knee is noted with limited range of motion. Neurologic: No focal neurological deficits   Lab Results:  Data Reviewed: I have personally reviewed following labs and reports of the imaging studies  CBC: Recent Labs  Lab 05/31/23 1813  06/01/23 0733  WBC 4.6 3.5*  HGB 12.6  12.9 11.3*  HCT 38.5  38.0 34.0*  MCV 96.5 96.0  PLT 142* 142*    Basic Metabolic Panel: Recent Labs  Lab 05/31/23 1813 05/31/23 2345 06/01/23 0733  NA 136  138  --  137  K 3.5  3.6  --  3.6  CL 104  102  --  102  CO2 22  --  23  GLUCOSE 145*  148*  --  139*  BUN 16  18  --  22  CREATININE 0.85  0.80  --  0.98  CALCIUM 9.2  --  8.8*  MG  --  1.9  --     GFR: CrCl cannot be calculated (Unknown ideal weight.).   Coagulation Profile: Recent Labs  Lab 05/28/23 1408 05/31/23 1813 06/01/23 0244  INR 3.3* 2.2* 2.2*     Radiology Studies: CT ABDOMEN PELVIS WO CONTRAST  Result Date: 05/31/2023 CLINICAL DATA:  Abdominal pain, acute, nonlocalized without contrast. Likely acute right superior pubic ramus fracture on earlier imaging EXAM: CT ABDOMEN AND PELVIS WITHOUT CONTRAST TECHNIQUE: Multidetector CT imaging of the abdomen and pelvis was performed following the standard protocol without IV contrast. RADIATION DOSE REDUCTION: This exam was performed according to the departmental dose-optimization program which includes automated exposure control, adjustment of the mA and/or kV according to patient size and/or use of iterative reconstruction technique. COMPARISON:  Pelvis x-ray today.  CT 03/29/2019 FINDINGS: Lower chest: No acute abnormality. Hepatobiliary: No focal hepatic abnormality. Gallbladder unremarkable. Pancreas: No focal abnormality or ductal dilatation. Spleen: No focal abnormality.  Normal size. Adrenals/Urinary Tract: Adrenal glands unremarkable. Left adrenal cyst measures 3 cm compared to 1.2 cm previously. No hydronephrosis. Urinary bladder unremarkable. Stomach/Bowel: Left colonic diverticulosis. No active diverticulitis. Stomach and small bowel decompressed. No bowel obstruction. Vascular/Lymphatic: Diffuse aortoiliac atherosclerosis. No aneurysm calcified retroperitoneal lymph nodes are stable. Reproductive: Prior  hysterectomy.  No adnexal masses. Other: No free fluid or free air. Musculoskeletal: Right superior and inferior pubic rami fractures. Surrounding hematoma in the right extraperitoneal space. Mild degenerative changes in the hips and lumbar spine. IMPRESSION: Right superior and inferior pubic rami fractures. Surrounding mild hematoma. Left colonic diverticulosis.  No active diverticulitis. 3 cm cystic lesion in the midpole of the left kidney. This likely reflects cyst, but has enlarged significantly since prior study. This could be further evaluated with elective renal ultrasound. Aortoiliac atherosclerosis. Electronically Signed   By: Charlett Nose M.D.   On: 05/31/2023 22:07   CT Head Wo Contrast  Result Date: 05/31/2023 CLINICAL DATA:  Head trauma, minor (Age >= 65y); Polytrauma, blunt EXAM: CT HEAD WITHOUT CONTRAST CT CERVICAL SPINE WITHOUT CONTRAST TECHNIQUE: Multidetector CT imaging of the head and cervical spine was performed following the standard protocol without intravenous contrast. Multiplanar CT image reconstructions of the cervical spine were also generated. RADIATION DOSE REDUCTION: This exam was performed according to the departmental dose-optimization program which includes automated exposure control, adjustment of the mA and/or kV according to patient size and/or use of iterative reconstruction technique. COMPARISON:  CT head 05/31/2021, CT  angio head and neck 09/04/2019 FINDINGS: CT HEAD FINDINGS Brain: Cerebral ventricle sizes are concordant with the degree of cerebral volume loss. Patchy and confluent areas of decreased attenuation are noted throughout the deep and periventricular white matter of the cerebral hemispheres bilaterally, compatible with chronic microvascular ischemic disease. No evidence of large-territorial acute infarction. No parenchymal hemorrhage. No mass lesion. No extra-axial collection. No mass effect or midline shift. No hydrocephalus. Basilar cisterns are patent.  Vascular: No hyperdense vessel. Atherosclerotic calcifications are present within the cavernous internal carotid and vertebral arteries. Skull: No acute fracture or focal lesion. Sinuses/Orbits: Paranasal sinuses and mastoid air cells are clear. Bilateral lens replacement. Otherwise the orbits are unremarkable. Other: None. CT CERVICAL SPINE FINDINGS Alignment: Grade 1 anterolisthesis of C2 on C3. Skull base and vertebrae: Multilevel at least moderate degenerative changes spine. No associated severe osseous neural foraminal or central canal stenosis. No acute fracture. No aggressive appearing focal osseous lesion or focal pathologic process. Soft tissues and spinal canal: No prevertebral fluid or swelling. No visible canal hematoma. Upper chest: Unremarkable. Other: Atherosclerotic plaque of the aortic arch and its main branches. IMPRESSION: 1. No acute intracranial abnormality. 2. No acute displaced fracture or traumatic listhesis of the cervical spine. 3.  Aortic Atherosclerosis (ICD10-I70.0). Electronically Signed   By: Tish Frederickson M.D.   On: 05/31/2023 18:34   CT Cervical Spine Wo Contrast  Result Date: 05/31/2023 CLINICAL DATA:  Head trauma, minor (Age >= 65y); Polytrauma, blunt EXAM: CT HEAD WITHOUT CONTRAST CT CERVICAL SPINE WITHOUT CONTRAST TECHNIQUE: Multidetector CT imaging of the head and cervical spine was performed following the standard protocol without intravenous contrast. Multiplanar CT image reconstructions of the cervical spine were also generated. RADIATION DOSE REDUCTION: This exam was performed according to the departmental dose-optimization program which includes automated exposure control, adjustment of the mA and/or kV according to patient size and/or use of iterative reconstruction technique. COMPARISON:  CT head 05/31/2021, CT angio head and neck 09/04/2019 FINDINGS: CT HEAD FINDINGS Brain: Cerebral ventricle sizes are concordant with the degree of cerebral volume loss. Patchy and  confluent areas of decreased attenuation are noted throughout the deep and periventricular white matter of the cerebral hemispheres bilaterally, compatible with chronic microvascular ischemic disease. No evidence of large-territorial acute infarction. No parenchymal hemorrhage. No mass lesion. No extra-axial collection. No mass effect or midline shift. No hydrocephalus. Basilar cisterns are patent. Vascular: No hyperdense vessel. Atherosclerotic calcifications are present within the cavernous internal carotid and vertebral arteries. Skull: No acute fracture or focal lesion. Sinuses/Orbits: Paranasal sinuses and mastoid air cells are clear. Bilateral lens replacement. Otherwise the orbits are unremarkable. Other: None. CT CERVICAL SPINE FINDINGS Alignment: Grade 1 anterolisthesis of C2 on C3. Skull base and vertebrae: Multilevel at least moderate degenerative changes spine. No associated severe osseous neural foraminal or central canal stenosis. No acute fracture. No aggressive appearing focal osseous lesion or focal pathologic process. Soft tissues and spinal canal: No prevertebral fluid or swelling. No visible canal hematoma. Upper chest: Unremarkable. Other: Atherosclerotic plaque of the aortic arch and its main branches. IMPRESSION: 1. No acute intracranial abnormality. 2. No acute displaced fracture or traumatic listhesis of the cervical spine. 3.  Aortic Atherosclerosis (ICD10-I70.0). Electronically Signed   By: Tish Frederickson M.D.   On: 05/31/2023 18:34   DG Femur Min 2 Views Right  Result Date: 05/31/2023 CLINICAL DATA:  trauma EXAM: RIGHT FEMUR 2 VIEWS COMPARISON:  X-ray pelvis/right hip 05/31/2023 FINDINGS: Age-indeterminate, likely acute, superior right pubic rami fracture. There is  no evidence of fracture or other focal bone lesions of the right acetabulum or femur. Soft tissues are unremarkable. Vascular calcifications. Severe degenerative changes of the tibiofemoral joint. IMPRESSION: Age  indeterminate, likely acute, displaced superior right pubic rami fracture. Electronically Signed   By: Tish Frederickson M.D.   On: 05/31/2023 18:17   DG Pelvis Portable  Result Date: 05/31/2023 CLINICAL DATA:  Abdominal x-ray EXAM: PORTABLE PELVIS 1-2 VIEWS COMPARISON:  CT abdomen pelvis 03/29/2019 FINDINGS: Limited evaluation due to overlapping osseous structures and overlying soft tissues. Mild degenerative changes of the right hip. Likely right superior pubic rami fracture. There is no evidence of pelvic fracture or diastasis. No pelvic bone lesions are seen. Degenerative changes of the visualized lower lumbar spine. Lucency along the left transverse process of L5 level. Calcifications along the midline lower abdomen at the level of the L4-L5 level correlates with findings on the CT abdomen pelvis 03/29/2019. IMPRESSION: 1. Likely right superior pubic rami fracture. Limited evaluation due to overlapping osseous structures and overlying soft tissues. Limited evaluation on single view radiograph. 2. Lucency along the left transverse process of L5 level. Possible nondisplaced fracture. Electronically Signed   By: Tish Frederickson M.D.   On: 05/31/2023 18:15   DG Chest Portable 1 View  Result Date: 05/31/2023 CLINICAL DATA:  trauma EXAM: PORTABLE CHEST 1 VIEW COMPARISON:  Chest x-ray 09/04/2019 FINDINGS: Loop catheter overlies the upper mediastinum. The heart and mediastinal contours are unchanged. Aortic calcification. No focal consolidation. No pulmonary edema. No pleural effusion. No pneumothorax. No acute osseous abnormality. Severe degenerative changes of the left shoulder. IMPRESSION: 1. No active disease. 2.  Aortic Atherosclerosis (ICD10-I70.0). Electronically Signed   By: Tish Frederickson M.D.   On: 05/31/2023 18:10       LOS: 1 day   [employer]  [facility] Pager on [email or web address]  06/01/2023, 8:59 AM

## 2023-06-01 NOTE — Evaluation (Deleted)
Physical Therapy Evaluation Patient Details Name: Virginia Franklin MRN: [id number] DOB: 03-25-1929 Today's Date: 06/01/2023  History of Present Illness  89 y.o. female who presents to ED from [facility] s/p fall out off bed today with resulting pelvic pain and R ankle tenderness. Per family she had fall after am med past and was on floor for a few hours.Imaging revealed displaced superior right pubic rami fracture,  WGN:[id number]'H dementia, Arthritis, OA, CVA ischemic, hemorrhagic CVA 09-04-19 Hypertension, DJD of lumbar spine( hx of stress fracture),Atrial fibrillation, mechanical AVR secondary to rheumatic heart disease on warfarin  Clinical Impression  PTA pt living at [facility], where facility provided medication management and meals. Pt able to perform basic ADLs and ambulates household distances with Rollator, although granddaughter reports she often forgets to use the Rollator. Pt is limited in safe mobility by decreased cognition resultant from Alzheimer's disease which impacts her safety awareness and memory to use her AD, in presence of new R LE pain in hip from fx as well as knee and ankle (imaging unable to obtain due to inability to properly position pt due to pain), in presence of underlying decreased UE ROM and decreased strength. Pt requires maxAx2 for bed level activity with maximal optimization of bed functions and standing to [place] mobility equipment from extremely elevated bed surface.  Patient will benefit from continued inpatient follow up therapy, <3 hours/day. PT will continue to follow acutely.       Assistance Recommended at Discharge Frequent or constant Supervision/Assistance  If plan is discharge home, recommend the following:  Can travel by private vehicle  Two people to help with walking and/or transfers;Two people to help with bathing/dressing/bathroom;Assistance with cooking/housework;Assistance with feeding;Direct supervision/assist for  medications management;Assist for transportation;Direct supervision/assist for financial management;Help with stairs or ramp for entrance   No    Equipment Recommendations None recommended by PT     Functional Status Assessment Patient has had a recent decline in their functional status and demonstrates the ability to make significant improvements in function in a reasonable and predictable amount of time.     Precautions / Restrictions Precautions Precautions: Fall Precaution Comments: hospitalization result of fall Restrictions Weight Bearing Restrictions: No      Mobility  Bed Mobility Overal bed mobility: Needs Assistance Bed Mobility: Supine to Sit     Supine to sit: Max assist, +2 for physical assistance, HOB elevated     General bed mobility comments: with HoB maximally elevated and maximal multimodal cuing for reaching to bed rail to help pull toward EoB, able to manage LE to EoB but requires maxA and use of bed pad to pivot hips and lower LE to floor    Transfers Overall transfer level: Needs assistance Equipment used: Ambulation equipment used Transfers: Sit to/from Stand Sit to Stand: From elevated surface, Max assist, +2 physical assistance           General transfer comment: from very elevated bed surface, pt requires maximal multimodal cuing and max Ax2 for assistance to manage LE to foot pad, UE reach to bar to pull up, and for powerup to standing for pad placement to transfer to chair, pt requires assist to stand from elevated pads and for slowed descent into chair    Ambulation/Gait               General Gait Details: unable at this time due to pain      Balance Overall balance assessment: Needs assistance Sitting-balance support: Feet supported,  Bilateral upper extremity supported Sitting balance-Leahy Scale: Poor Sitting balance - Comments: pt requires posterior support initially able to progress to short bout of unsupported sitting  before transferring to standing in [place]   Standing balance support: Bilateral upper extremity supported, During functional activity, Reliant on assistive device for balance Standing balance-Leahy Scale: Zero Standing balance comment: require UE and outside support to maintain standing balance with Stedy tranfer to recliner                             Pertinent Vitals/Pain Pain Assessment Pain Assessment: Faces Faces Pain Scale: Hurts even more Pain Location: R hip and LE with movement and weightbearing Pain Descriptors / Indicators: Grimacing, Guarding, Discomfort Pain Intervention(s): Limited activity within patient's tolerance, Monitored during session, Repositioned    Home Living Family/patient expects to be discharged to:: Assisted living                 Home Equipment: Shower seat Additional Comments: [name]    Prior Function Prior Level of Function : Needs assist             Mobility Comments: ambulates with Rollator but often forgets to use it ADLs Comments: facility manages her medications and her meals     Hand Dominance   Dominant Hand: Right    Extremity/Trunk Assessment   Upper Extremity Assessment Upper Extremity Assessment: Defer to OT evaluation    Lower Extremity Assessment Lower Extremity Assessment: RLE deficits/detail;LLE deficits/detail RLE Deficits / Details: limited by pain but with AAROM able to facilitate movement at ankle hip and knee LLE Deficits / Details: AROM functional but not full range, strength grossly 3+/4    Cervical / Trunk Assessment Cervical / Trunk Assessment: Other exceptions;Kyphotic Cervical / Trunk Exceptions: chronic back pain  Communication   Communication: No difficulties  Cognition Arousal/Alertness: Awake/alert Behavior During Therapy: WFL for tasks assessed/performed Overall Cognitive Status: History of cognitive impairments - at baseline                                  General Comments: no clear memory of what happened to result in fall, poor memory at baseline        General Comments General comments (skin integrity, edema, etc.): VSS on RA, granddaughter present provided PLOF and home setup        Assessment/Plan    PT Assessment Patient needs continued PT services  PT Problem List Decreased strength;Decreased range of motion;Decreased activity tolerance;Decreased balance;Decreased mobility;Decreased coordination;Decreased cognition;Decreased safety awareness;Pain       PT Treatment Interventions DME instruction;Gait training;Functional mobility training;Therapeutic activities;Therapeutic exercise;Balance training;Cognitive remediation;Patient/family education    PT Goals (Current goals can be found in the Care Plan section)  Acute Rehab PT Goals Patient Stated Goal: go home PT Goal Formulation: With patient/family Time For Goal Achievement: 06/15/23 Potential to Achieve Goals: Fair    Frequency Min 3X/week     Co-evaluation PT/OT/SLP Co-Evaluation/Treatment: Yes Reason for Co-Treatment: Complexity of the patient's impairments (multi-system involvement);Necessary to address cognition/behavior during functional activity;For patient/therapist safety           AM-PAC PT "6 Clicks" Mobility  Outcome Measure Help needed turning from your back to your side while in a flat bed without using bedrails?: A Lot Help needed moving from lying on your back to sitting on the side of a flat bed without using bedrails?: Total  Help needed moving to and from a bed to a chair (including a wheelchair)?: Total Help needed standing up from a chair using your arms (e.g., wheelchair or bedside chair)?: Total Help needed to walk in hospital room?: Total Help needed climbing 3-5 steps with a railing? : Total 6 Click Score: 7    End of Session   Activity Tolerance: Patient limited by pain Patient left: in chair;with call bell/phone within reach;with  chair alarm set Nurse Communication: Mobility status;Other (comment) (protein shake and possible low level analgesic (Tylenol, Advil)) PT Visit Diagnosis: Unsteadiness on feet (R26.81);Other abnormalities of gait and mobility (R26.89);Muscle weakness (generalized) (M62.81);History of falling (Z91.81);Difficulty in walking, not elsewhere classified (R26.2);Pain Pain - Right/Left: Right Pain - part of body: Hip;Knee;Ankle and joints of foot    Time: 4098-1191 PT Time Calculation (min) (ACUTE ONLY): 44 min   Charges:   PT Evaluation $PT Eval Moderate Complexity: 1 Mod PT Treatments $Therapeutic Activity: 8-22 mins PT General Charges $$ ACUTE PT VISIT: 1 Visit         Miller Limehouse B. Beverely Risen PT, DPT Acute Rehabilitation Services Please use secure chat or  Call Office [phone]   [facility]on [facility] 06/01/2023, 10:31 AM

## 2023-06-01 NOTE — Plan of Care (Signed)

## 2023-06-01 NOTE — Evaluation (Signed)
Occupational Therapy Evaluation Patient Details Name: Virginia Franklin MRN: [id number] DOB: Oct 24, 1929 Today's Date: 06/01/2023   History of Present Illness 89 y.o. female who presents to ED from [facility] s/p fall out off bed today with resulting pelvic pain and R ankle tenderness. Per family she had fall after am med past and was on floor for a few hours.Imaging revealed displaced superior right pubic rami fracture,  SEG:[id number] dementia, Arthritis, OA, CVA ischemic, hemorrhagic CVA 09-04-19 Hypertension, DJD of lumbar spine( hx of stress fracture),Atrial fibrillation, mechanical AVR secondary to rheumatic heart disease on warfarin   Clinical Impression    Pt came from [facility] ILF setting and she was having medication reminders and meals in the dinning room. Pt normally uses a RW but often forgets to use it throughout the home. Pt can not recall what exactly has happened at this time but did have a fall. Pt when at rest did not indicate pain. Pt when moving LB, bed mobility and/or transfers indicated by pointing she has pain at R hip. Pt required from a sitting position of hospital bed with max x2 to complete supine to sitting and with steady sitting to standing with maxx2 assist. Once pt was transferred into chair pt reported no pain. Will continue to follow. Patient will benefit from continued inpatient follow up therapy, <3 hours/day      Recommendations for follow up therapy are one component of a multi-disciplinary discharge planning process, led by the attending physician.  Recommendations may be updated based on patient status, additional functional criteria and insurance authorization.   Assistance Recommended at Discharge Frequent or constant Supervision/Assistance  Patient can return home with the following Two people to help with walking and/or transfers;Two people to help with bathing/dressing/bathroom;Assistance with cooking/housework;Assistance with  feeding;Direct supervision/assist for medications management;Direct supervision/assist for financial management;Assist for transportation    Functional Status Assessment  Patient has had a recent decline in their functional status and demonstrates the ability to make significant improvements in function in a reasonable and predictable amount of time.  Equipment Recommendations   (TBD at next level of care but if they go home they will need hospital bed, steady lift)    Recommendations for Other Services       Precautions / Restrictions Precautions Precautions: Fall Precaution Comments: hospitalization result of fall Restrictions Weight Bearing Restrictions: No      Mobility Bed Mobility Overal bed mobility: Needs Assistance Bed Mobility: Supine to Sit     Supine to sit: Max assist, +2 for physical assistance, HOB elevated     General bed mobility comments: with HoB maximally elevated and maximal multimodal cuing for reaching to bed rail to help pull toward EoB, able to manage LE to EoB but requires maxA and use of bed pad to pivot hips and lower LE to floor    Transfers Overall transfer level: Needs assistance Equipment used: Ambulation equipment used Transfers: Sit to/from Stand Sit to Stand: From elevated surface, Max assist, +2 physical assistance           General transfer comment: from very elevated bed surface, pt requires maximal multimodal cuing and max Ax2 for assistance to manage LE to foot pad, UE reach to bar to pull up, and for powerup to standing for pad placement to transfer to chair, pt requires assist to stand from elevated pads and for slowed descent into chair      Balance Overall balance assessment: Needs assistance Sitting-balance support: Feet supported, Bilateral upper extremity  supported Sitting balance-Leahy Scale: Poor Sitting balance - Comments: pt requires posterior support initially able to progress to short bout of unsupported sitting  before transferring to standing in Stedy   Standing balance support: Bilateral upper extremity supported, During functional activity, Reliant on assistive device for balance Standing balance-Leahy Scale: Zero Standing balance comment: require UE and outside support to maintain standing balance with Stedy tranfer to recliner                           ADL either performed or assessed with clinical judgement   ADL Overall ADL's : Needs assistance/impaired Eating/Feeding: Set up;Sitting;Minimal assistance   Grooming: Wash/dry hands;Wash/dry face;Minimal assistance;Sitting Grooming Details (indicate cue type and reason): needs instruction and limited AROM in BUE Upper Body Bathing: Moderate assistance;Sitting   Lower Body Bathing: Total assistance;+2 for physical assistance;+2 for safety/equipment;Bed level   Upper Body Dressing : Moderate assistance;Sitting   Lower Body Dressing: Total assistance;+2 for physical assistance;+2 for safety/equipment;Bed level   Toilet Transfer:  (NT)   Toileting- Clothing Manipulation and Hygiene: Total assistance;+2 for physical assistance;+2 for safety/equipment;Bed level   Tub/ Shower Transfer:  (NT)           Vision         Perception     Praxis      Pertinent Vitals/Pain Pain Assessment Pain Assessment: Faces Faces Pain Scale: Hurts even more Pain Location: R hip and LE with movement and weightbearing Pain Descriptors / Indicators: Grimacing, Guarding, Discomfort Pain Intervention(s): Limited activity within patient's tolerance, Monitored during session, Repositioned     Hand Dominance Right   Extremity/Trunk Assessment Upper Extremity Assessment Upper Extremity Assessment: RUE deficits/detail;LUE deficits/detail RUE Deficits / Details: Pt has chronic limitation in BUE however unclear what is baseline but unable to complete more than about 45 degrees shoulder flexion and noted liited extension at elbow but could be due  to IV placement RUE Coordination: WNL LUE Deficits / Details: noted limited AROM and strength but appears to be chronic in nature LUE Coordination: WNL   Lower Extremity Assessment Lower Extremity Assessment: Defer to PT evaluation RLE Deficits / Details: limited by pain but with AAROM able to facilitate movement at ankle hip and knee LLE Deficits / Details: AROM functional but not full range, strength grossly 3+/4   Cervical / Trunk Assessment Cervical / Trunk Assessment: Kyphotic;Other exceptions Cervical / Trunk Exceptions: chronic back pain   Communication Communication Communication: HOH   Cognition Arousal/Alertness: Awake/alert Behavior During Therapy: WFL for tasks assessed/performed Overall Cognitive Status: History of cognitive impairments - at baseline                                 General Comments: no clear memory of what happened to result in fall, poor memory at baseline     General Comments  VSS on RA, granddaughter present provided PLOF and home setup    Exercises     Shoulder Instructions      Home Living Family/patient expects to be discharged to:: Other (Comment)                             Home Equipment: Rollator (4 wheels);Shower seat   Additional Comments: ILF at [facility] with some caregiver assist for medication reminders. Meals are provided in dinning area but pt has to ambulate to retrieve.  Prior Functioning/Environment Prior Level of Function : Needs assist  Cognitive Assist : ADLs (cognitive);Mobility (cognitive) Mobility (Cognitive):  (needs reminders to use walker) ADLs (Cognitive):  (needs reminders for saftey, orientatin and med reminders)       Mobility Comments: ambulates with Rollator but often forgets to use it ADLs Comments: facility manages her medications and her meals        OT Problem List: Decreased strength;Decreased range of motion;Decreased activity tolerance;Impaired balance  (sitting and/or standing);Decreased knowledge of use of DME or AE;Decreased cognition;Pain;Impaired UE functional use      OT Treatment/Interventions: Self-care/ADL training;DME and/or AE instruction;Therapeutic activities;Patient/family education;Balance training    OT Goals(Current goals can be found in the care plan section) Acute Rehab OT Goals Patient Stated Goal: none reported OT Goal Formulation: With family Time For Goal Achievement: 06/15/23 Potential to Achieve Goals: Fair  OT Frequency: Min 2X/week    Co-evaluation PT/OT/SLP Co-Evaluation/Treatment: Yes Reason for Co-Treatment: Complexity of the patient's impairments (multi-system involvement);Necessary to address cognition/behavior during functional activity;For patient/therapist safety   OT goals addressed during session: ADL's and self-care      AM-PAC OT "6 Clicks" Daily Activity     Outcome Measure Help from another person eating meals?: A Little Help from another person taking care of personal grooming?: A Little Help from another person toileting, which includes using toliet, bedpan, or urinal?: Total Help from another person bathing (including washing, rinsing, drying)?: Total Help from another person to put on and taking off regular upper body clothing?: A Little Help from another person to put on and taking off regular lower body clothing?: Total 6 Click Score: 12   End of Session Nurse Communication: Mobility status (pain meds, food, transfer)  Activity Tolerance: Patient limited by pain Patient left: in chair;with call bell/phone within reach;with family/visitor present (granddaughter)  OT Visit Diagnosis: Unsteadiness on feet (R26.81);Other abnormalities of gait and mobility (R26.89);Repeated falls (R29.6);Muscle weakness (generalized) (M62.81);Pain Pain - Right/Left: Right Pain - part of body: Hip                Time: 4401-0272 OT Time Calculation (min): 46 min Charges:  OT General Charges $OT Visit:  1 Visit OT Evaluation $OT Eval Moderate Complexity: 1 Mod  Vashaun Osmon K OTR/L  Acute Rehab Services  [phone] office number   Alphia Moh 06/01/2023, 11:21 AM

## 2023-06-02 ENCOUNTER — Other Ambulatory Visit: Payer: Self-pay | Admitting: Family Medicine

## 2023-06-02 DIAGNOSIS — E876 Hypokalemia: Secondary | ICD-10-CM | POA: Diagnosis not present

## 2023-06-02 DIAGNOSIS — R7989 Other specified abnormal findings of blood chemistry: Secondary | ICD-10-CM

## 2023-06-02 DIAGNOSIS — S32501A Unspecified fracture of right pubis, initial encounter for closed fracture: Secondary | ICD-10-CM | POA: Diagnosis not present

## 2023-06-02 LAB — CBC
HCT: 31.2 % — ABNORMAL LOW (ref 36.0–46.0)
Hemoglobin: 10.2 g/dL — ABNORMAL LOW (ref 12.0–15.0)
MCH: 31.8 pg (ref 26.0–34.0)
MCHC: 32.7 g/dL (ref 30.0–36.0)
MCV: 97.2 fL (ref 80.0–100.0)
Platelets: 134 10*3/uL — ABNORMAL LOW (ref 150–400)
RBC: 3.21 MIL/uL — ABNORMAL LOW (ref 3.87–5.11)
RDW: 15 % (ref 11.5–15.5)
WBC: 3.4 10*3/uL — ABNORMAL LOW (ref 4.0–10.5)
nRBC: 0 % (ref 0.0–0.2)

## 2023-06-02 LAB — COMPREHENSIVE METABOLIC PANEL
ALT: 17 U/L (ref 0–44)
AST: 20 U/L (ref 15–41)
Albumin: 3.2 g/dL — ABNORMAL LOW (ref 3.5–5.0)
Alkaline Phosphatase: 54 U/L (ref 38–126)
Anion gap: 7 (ref 5–15)
BUN: 40 mg/dL — ABNORMAL HIGH (ref 8–23)
CO2: 24 mmol/L (ref 22–32)
Calcium: 8.3 mg/dL — ABNORMAL LOW (ref 8.9–10.3)
Chloride: 103 mmol/L (ref 98–111)
Creatinine, Ser: 1.07 mg/dL — ABNORMAL HIGH (ref 0.44–1.00)
GFR, Estimated: 49 mL/min — ABNORMAL LOW (ref >60)
Glucose, Bld: 126 mg/dL — ABNORMAL HIGH (ref 70–99)
Potassium: 3.4 mmol/L — ABNORMAL LOW (ref 3.5–5.1)
Sodium: 134 mmol/L — ABNORMAL LOW (ref 135–145)
Total Bilirubin: 0.4 mg/dL (ref 0.3–1.2)
Total Protein: 6.2 g/dL — ABNORMAL LOW (ref 6.5–8.1)

## 2023-06-02 LAB — PROTIME-INR
INR: 2.7 — ABNORMAL HIGH (ref 0.8–1.2)
Prothrombin Time: 29 seconds — ABNORMAL HIGH (ref 11.4–15.2)

## 2023-06-02 MED ORDER — POTASSIUM CHLORIDE CRYS ER 20 MEQ PO TBCR
40.0000 meq | EXTENDED_RELEASE_TABLET | Freq: Once | ORAL | Status: AC
  Administered 2023-06-02: 40 meq via ORAL
  Filled 2023-06-02: qty 2

## 2023-06-02 MED ORDER — ENSURE ENLIVE PO LIQD
237.0000 mL | Freq: Two times a day (BID) | ORAL | Status: DC
  Administered 2023-06-03 – 2023-06-04 (×4): 237 mL via ORAL

## 2023-06-02 MED ORDER — RENA-VITE PO TABS
1.0000 | ORAL_TABLET | Freq: Every day | ORAL | Status: DC
  Administered 2023-06-03: 1 via ORAL
  Filled 2023-06-02 (×2): qty 1

## 2023-06-02 MED ORDER — HALOPERIDOL LACTATE 5 MG/ML IJ SOLN
1.0000 mg | Freq: Once | INTRAMUSCULAR | Status: AC | PRN
  Administered 2023-06-02: 1 mg via INTRAVENOUS
  Filled 2023-06-02: qty 1

## 2023-06-02 MED ORDER — SODIUM CHLORIDE 0.9 % IV SOLN: INTRAVENOUS | Status: DC

## 2023-06-02 NOTE — Progress Notes (Signed)
Initial Nutrition Assessment  DOCUMENTATION CODES:   Not applicable  INTERVENTION:  - Add Ensure Enlive po BID, each supplement provides 350 kcal and 20 grams of protein.  - Add MVI q day.   NUTRITION DIAGNOSIS:   Increased nutrient needs related to hip fracture as evidenced by estimated needs.  GOAL:   Patient will meet greater than or equal to 90% of their needs  MONITOR:   PO intake, Supplement acceptance  REASON FOR ASSESSMENT:   Consult Hip fracture protocol  ASSESSMENT:   89 y.o. female admits related to fall. PMH includes: Alzheimer's disease, arthritis, cervical cancer, osteoarthritis, diverticulitis of colon, HTN. Pt is currently receiving medical management related to pubic rami fracture secondary to mechanical fall/mild pelvic hematoma.  Meds reviewed:  Vitamin D. Labs reviewed: Na low, K low, BUN/Creatinine high.   Pt admits from an independent living retirement community. Pt with Alzheimer's dementia and is oriented x1. The patient is currently on a Dys 2 diet, Nectar thick liquids. No intakes documented so far this admission. No significant wt loss per record. Will add supplements and continue to closely monitor PO intakes.   NUTRITION - FOCUSED PHYSICAL EXAM:  Remote assessment.   Diet Order:   Diet Order             DIET DYS 2 Room service appropriate? Yes; Fluid consistency: Nectar Thick  Diet effective now                   EDUCATION NEEDS:   Not appropriate for education at this time  Skin:  Skin Assessment: Reviewed RN Assessment  Last BM:  7/18  Height:   Ht Readings from Last 1 Encounters:  02/26/23 5\' 6"  (1.676 m)    Weight:   Wt Readings from Last 1 Encounters:  02/26/23 56.3 kg    Ideal Body Weight:     BMI:  There is no height or weight on file to calculate BMI.  Estimated Nutritional Needs:   Kcal:  1700-2000 kcals  Protein:  85-100 gm  Fluid:  >/= 1.7 L  Bethann Humble, RD, LDN, CNSC.

## 2023-06-02 NOTE — Plan of Care (Signed)

## 2023-06-02 NOTE — Evaluation (Signed)
Clinical/Bedside Swallow Evaluation Patient Details  Name: Virginia Franklin MRN: [id number] Date of Birth: 11-15-1928  Today's Date: 06/02/2023 Time: SLP Start Time (ACUTE ONLY): 1642 SLP Stop Time (ACUTE ONLY): 1728 SLP Time Calculation (min) (ACUTE ONLY): 46 min  Past Medical History:  Past Medical History:  Diagnosis Date   Alzheimer's dementia (HCC) 2017   Arthritis    OA   Body mass index (BMI) of 22.0-22.9 in adult    Cervical cancer (HCC)    had hysterctomy for   H/O osteoporosis    H/O: CVA (cerebrovascular accident) 2012   , HEMORRHAGIC 09-04-2019 ALSO   H/O: osteoarthritis    Hammertoe of left foot    History of urinary frequency    Hx of diverticulitis of colon    Hypertension    Lumbar stress fracture lL3, 01-2020   Past Surgical History:  Past Surgical History:  Procedure Laterality Date   ABDOMINAL HYSTERECTOMY     CATARACT EXTRACTION Bilateral    HAMMER TOE SURGERY Left 07/29/2020   Procedure: HAMMER TOE REPAIR 2ND TOE LEFT;  Surgeon: Edwin Cap, DPM;  Location: [facility] [place];  Service: Podiatry;  Laterality: Left;   INCISION AND DRAINAGE Bilateral 07/29/2020   Procedure: EXCISION OF WOUND OF LEFT SECOND TOE; RELEASE OF TENDONS LEFT THIRD TOE, RIGHT THIRD TOE;  Surgeon: Edwin Cap, DPM;  Location: [facility] [place];  Service: Podiatry;  Laterality: Bilateral;   mechanical heart valve  1990   st jude done in [place]   SMALL INTESTINE SURGERY     HPI:  Pt is a 89 y.o. female with medical history significant of  Alzheimer's dementia, Arthritis, OA, CVA ischemic, hemorrhagic CVA 09-04-19 Hypertension, DJD of lumbar spine( hx of stress fracture),Atrial fibrillation, mechanical AVR secondary to rheumatic heart disease on warfarin who presents to ED s/p fall out off bed today with resulting pelvic pain. Patient noted no fever/chills/ n/v/d/or dysuria. Per family she has fall after am med past and was on floor for a few hours.  Patient noted continue pain in pelvis but worse with movement, she also note tenderness of right ankle. Pt was encountered approx an hour after being administered morphine, prior to morphine the pt was in immense pain per daughters reports. The pt lives alone in an independent living facility. Per daughters report, the pt has had intermittent difficulty swallowing prior to admission but has had a significant increase in s/sx of aspiration during the course of this hospitalization.    Assessment / Plan / Recommendation  Clinical Impression  Pt seen for skilled ST evaluation of swallow function. Pt is currently on regular/thin liquid diet and was assessed with thin liquid, mildly thickened liquid, puree, and minced and moist and regular solids. The pt was given morphine one hour prior due to severe pain, pt's daughter reports the pt may be more fatigues and lethargic due to this and being previously moved. Pt given OME which revealed generalized weakness of the facial muscles, a weak volitional cough and weak throat clearance, and xerostomia (per daughter's report, this is due to dehydration). The pt has top dentures but not bottom, she is also missing some molars on both sides of the bottom row of teeth. The pt had several sips of thin liquid via straw sips and cup sips, the pt had an immediate weak and wet cough x1, a delayed weak cough x2, a delayed throat clearance x1, and wet vocal quality t/o most trials (with increasing wet quality with trial  advancements). The pt consumed mildly thickened liquids x5 with intermittent wet vocal quality. The pt had puree with bolus holding and poor oral awareness and slight wet vocal quality. The pt with both minced and moist and regular solids had bolus holding, poor oral awareness, prolonged and disorganized mastication, and slight wet vocal quality. With all consistencies, the pt had double swallow, suspected delayed swallow trigger, decreased hyolaryngeal elevation. Pt  unable to elicit a strong cough and reswallow when given cues from her daughter and the SLP. Pt and pt's daughter given education on the results of the BSE, swallow strategies, diet recs, and possible further objective measures. Given the pt's health status, cog status, and results from BSE, safest diet recs are dys 2 (minced and moist IDDSI 5)/thin liquid (IDDSI 0) with STRICT oral care BEFORE and after meals with STRICT aspiration precuations (sit upright for all meals and 30 minutes following, small bites and sips, slow rate, total assist for feeding, cueing to decrease impulsivity with drinking). Due to pt's pain status and cog status, may not be a good candidate for MBS, possible FEES candidate given compliance with ST intervention. Pt should be seen again under more alert status to determine appropriateness for objective measures. SLP to follow up with acute care plan for swallow goals. SLP Visit Diagnosis: Dysphagia, unspecified (R13.10)    Aspiration Risk  Risk for inadequate nutrition/hydration;Moderate aspiration risk    Diet Recommendation Dysphagia 2 (Fine chop);Nectar-thick liquid    Liquid Administration via: Straw;Cup Medication Administration: Crushed with puree Supervision: Staff to assist with self feeding;Full supervision/cueing for compensatory strategies Compensations: Small sips/bites;Slow rate;Minimize environmental distractions;Clear throat intermittently Postural Changes: Seated upright at 90 degrees;Remain upright for at least 30 minutes after po intake    Other  Recommendations Oral Care Recommendations: Oral care before and after PO;Oral care BID;Staff/trained caregiver to provide oral care    Recommendations for follow up therapy are one component of a multi-disciplinary discharge planning process, led by the attending physician.  Recommendations may be updated based on patient status, additional functional criteria and insurance authorization.  Follow up  Recommendations Acute inpatient rehab (3hours/day)      Assistance Recommended at Discharge    Functional Status Assessment Patient has had a recent decline in their functional status and/or demonstrates limited ability to make significant improvements in function in a reasonable and predictable amount of time  Frequency and Duration min 3x week  2 weeks       Prognosis Prognosis for improved oropharyngeal function: Fair Barriers to Reach Goals: Cognitive deficits;Severity of deficits      Swallow Study   General Date of Onset: 06/02/23 HPI: Pt is a 89 y.o. female with medical history significant of  Alzheimer's dementia, Arthritis, OA, CVA ischemic, hemorrhagic CVA 09-04-19 Hypertension, DJD of lumbar spine( hx of stress fracture),Atrial fibrillation, mechanical AVR secondary to rheumatic heart disease on warfarin who presents to ED s/p fall out off bed today with resulting pelvic pain. Patient noted no fever/chills/ n/v/d/or dysuria. Per family she has fall after am med past and was on floor for a few hours. Patient noted continue pain in pelvis but worse with movement, she also note tenderness of right ankle. Pt was encountered approx an hour after being administered morphine, prior to morphine the pt was in immense pain per daughters reports. The pt lives alone in an independent living facility. Per daughters report, the pt has had intermittent difficulty swallowing prior to admission but has had a significant increase in s/sx of  aspiration during the course of this hospitalization. Type of Study: Bedside Swallow Evaluation Diet Prior to this Study: Regular;Thin liquids (Level 0) Respiratory Status: Room air History of Recent Intubation: No Behavior/Cognition: Confused;Lethargic/Drowsy;Distractible;Requires cueing;Pleasant mood;Cooperative Oral Cavity Assessment: Dry (Pts daughter) Oral Care Completed by SLP: No Oral Cavity - Dentition: Dentures, top;Edentulous (some missing molars on  either side of the bottom of the mouth, per pts daughter the pt had a partials but due to noncompliance/ confusion secondary dementia dx- they are no longer appropriate) Self-Feeding Abilities: Needs assist;Total assist Patient Positioning: Upright in chair Baseline Vocal Quality: Low vocal intensity Volitional Cough: Weak Volitional Swallow: Unable to elicit    Oral/Motor/Sensory Function Overall Oral Motor/Sensory Function: Generalized oral weakness Facial ROM: Within Functional Limits Facial Symmetry: Within Functional Limits Facial Strength: Reduced left;Reduced right Facial Sensation: Within Functional Limits Lingual ROM: Within Functional Limits Lingual Symmetry: Within Functional Limits Lingual Strength: Within Functional Limits Lingual Sensation: Within Functional Limits   Ice Chips Ice chips: Not tested   Thin Liquid Thin Liquid: Impaired Presentation: Cup;Self Fed;Straw Pharyngeal  Phase Impairments: Suspected delayed Swallow;Multiple swallows;Wet Vocal Quality;Throat Clearing - Delayed;Cough - Immediate;Throat Clearing - Immediate;Decreased hyoid-laryngeal movement    Nectar Thick Nectar Thick Liquid: Impaired Presentation: Cup;Self Fed;Straw Pharyngeal Phase Impairments: Suspected delayed Swallow;Multiple swallows;Wet Vocal Quality   Honey Thick Honey Thick Liquid: Not tested   Puree Puree: Impaired Presentation: Spoon Oral Phase Impairments: Poor awareness of bolus Oral Phase Functional Implications: Oral holding Pharyngeal Phase Impairments: Suspected delayed Swallow;Decreased hyoid-laryngeal movement;Multiple swallows;Wet Vocal Quality   Solid     Solid: Impaired Presentation: Spoon Oral Phase Impairments: Poor awareness of bolus;Impaired mastication Oral Phase Functional Implications: Prolonged oral transit;Oral residue;Oral holding Pharyngeal Phase Impairments: Multiple swallows;Suspected delayed Swallow;Decreased hyoid-laryngeal movement Other Comments:  prolonged and disorganized mastication      Dione Housekeeper M.S. CF-SLP

## 2023-06-02 NOTE — TOC Initial Note (Signed)
Transition of Care [facility]) - Initial/Assessment Note    Patient Details  Name: Virginia Franklin MRN: [id number] Date of Birth: 02-28-1929  Transition of Care [facility]) CM/SW Contact:    Helene Kelp, LCSW Phone Number: 06/02/2023, 1:21 PM               Clinical Narrative:    CSW followed-up patient disposition regarding SNF Rehab referrals.   Clinical team collaboration: CSW met with the CN and bedside nurse to review the pt's disposition. Clinical members noted the pt's disposition is for SNF placement..    CSW made contact with the following: CSW followed-up with the patient and natural supports to update them on current disposition efforts.  Pt orientation reviewed/assessed prior to communication (orientation x1). CSW met with the pt and daughter at the bedside and reviewed their current disposition. CSW reviewed referral process and assessed for pt's preference for SNF.  Patient did not express much to the CSW, but did communicate with her daughter.  Natural support (daughter/ Craig,Rebecca / [phone]) expressed preference for the pt as [facility] or a facility in [place] due to living in the area, and making it easy for her to support the pt (her mother). Lurena Joiner noted she is the point of contact and would for all information regarding the pt's care go through her.  Please contact the daughter Lurena Joiner for disposition updates.  CSW noted he will send out the referrals for the pt in or near [place], [facility] as preference update the clinical team.   Clinical team update: CSW updated members of the clinical team to this writer efforts to support the patient's current disposition and information noted above.   CSW disposition support provided: CSW completed SNF referrals for the pt.  FL2: completed  PASSR: the PASSR number has not been obtained. CSW had rouble gaining access to the PASSR site, and was unable to obtain the number.  Unit CSW/CM please  be advised to to obtain the PASSR number for the SNF referral which was completed.   Disposition follow-up: TOC-CSW/CM to follow and provide the pt with bed offers once updated in [facility] destination section. Please update the clinical team, pt natural supports/emergency contacts if they were contacted. Please follow-up with clinical support to move the pt's disposition forward with placement efforts made.   Please obtain PASSR number to support the patient's disposition.   No other needs identified at this current time for this Clinical research associate. Unit CSW to follow and continue efforts and progress made to support the patient's disposition.     Expected Discharge Plan: Skilled Nursing Facility Barriers to Discharge: Continued Medical Work up   Patient Goals and CMS Choice Patient states their goals for this hospitalization and ongoing recovery are:: Returning to physical baseline prior to the increase of medical care needs/hospital presentation. CMS Medicare.gov Compare Post Acute Care list provided to:: Patient Choice offered to / list presented to : Patient [place] ownership interest in [facility].provided to:: Adult Children    Expected Discharge Plan and Services       Prior Living Arrangements/Services     Patient language and need for interpreter reviewed:: No        Need for Family Participation in Patient Care: Yes (Comment) Care giver support system in place?: Yes (comment)      Activities of Daily Living      Permission Sought/Granted Permission sought to share information with : Case Manager, Magazine features editor, Family Supports Permission granted  to share information with : Yes, Verbal Permission Granted              Emotional Assessment Appearance:: Appears stated age Attitude/Demeanor/Rapport: Gracious Affect (typically observed): Accepting, Stable Orientation: : Oriented to Self Alcohol / Substance Use: Not Applicable Psych Involvement:  No (comment)  Admission diagnosis:  Pelvic fracture (HCC) [S32.9XXA] Fall, initial encounter [id number]] Closed fracture of superior ramus of right pubis, initial encounter (HCC) [S32.511A] Patient Active Problem List   Diagnosis Date Noted   Pelvic fracture (HCC) 05/31/2023   Compression fracture of L3 lumbar vertebra, closed, initial encounter (HCC) 01/17/2020   Acute spont intraparenchymal hemorrhage assoc w/ coagulopathy (HCC) L lateral thalamus and PLIC on warfarin 09/04/2019   Diverticulitis 03/29/2019   Abdominal pain 03/29/2019   Supratherapeutic INR 03/29/2019   Kidney mass 03/29/2019   Hyponatremia 03/29/2019   Nausea 03/29/2019   Constipation 03/29/2019   Alzheimer's dementia (HCC) 01/05/2019   Depression with anxiety 07/25/2018   Altered mental status 12/16/2017   Spells of speech arrest    HTN (hypertension) 10/15/2017   Dyslipidemia 10/15/2017   Hx of completed stroke 10/15/2017   Osteoarthritis 10/15/2017   Encounter for therapeutic drug monitoring 09/13/2017   Chronic anticoagulation 09/05/2017   Aortic valve replaced 09/05/2017   PCP:  Nelwyn Salisbury, MD Pharmacy:   [facility] - [place], [place] - [address] [place] [address] Phone: [phone] Fax: [phone]  [id number] - [place], [place] - [address] [place] [address] Phone: [phone] Fax: [phone]     Social Determinants of Health (SDOH) Social History: SDOH Screenings   Food Insecurity: No Food Insecurity (12/31/2022)  Housing: Low Risk  (12/31/2022)  Transportation Needs: No Transportation Needs (12/31/2022)  Utilities: Not At Risk (12/31/2022)  Alcohol Screen: Low Risk  (12/31/2022)  Depression (PHQ2-9): Low Risk  (12/31/2022)  Financial Resource Strain: Low Risk  (12/31/2022)  Physical Activity: Inactive (12/31/2022)  Social Connections: Moderately Isolated (12/31/2022)  Stress: No Stress  Concern Present (12/31/2022)  Tobacco Use: Low Risk  (02/27/2023)   SDOH Interventions:     Readmission Risk Interventions     No data to display

## 2023-06-02 NOTE — TOC CAGE-AID Note (Signed)
Transition of Care [facility]) - CAGE-AID Screening   Patient Details  Name: Virginia Franklin MRN: [id number] Date of Birth: [birth year]   Hewitt Shorts, RN Trauma Response Nurse Phone Number: [phone] 06/02/2023, 10:50 AM   Clinical Narrative:    CAGE-AID Screening: Substance Abuse Screening unable to be completed due to: : Patient unable to participate (hx of confusion, SNF resident)

## 2023-06-02 NOTE — [place] FL2 (Signed)
[place] MEDICAID FL2 LEVEL OF CARE FORM     IDENTIFICATION  Patient Name: Virginia Franklin Birthdate: 19-Dec-1928 Sex: female Admission Date (Current Location): 05/31/2023  [facility] and [place] Number:  Producer, television/film/video and Address:  [facility] [place], [address] [place], [place] [address]      Provider Number: [id number]  Attending Physician Name and Address:  Osvaldo Shipper, MD  Relative Name and Phone Number:  Larence Penning (Daughter) and [phone]    Current Level of Care: SNF Recommended Level of Care: Skilled Nursing Facility Prior Approval Number:    Date Approved/Denied:   PASRR Number: to be obtained  Discharge Plan: SNF    Current Diagnoses: Patient Active Problem List   Diagnosis Date Noted   Pelvic fracture (HCC) 05/31/2023   Compression fracture of L3 lumbar vertebra, closed, initial encounter (HCC) 01/17/2020   Acute spont intraparenchymal hemorrhage assoc w/ coagulopathy (HCC) L lateral thalamus and PLIC on warfarin 09/04/2019   Diverticulitis 03/29/2019   Abdominal pain 03/29/2019   Supratherapeutic INR 03/29/2019   Kidney mass 03/29/2019   Hyponatremia 03/29/2019   Nausea 03/29/2019   Constipation 03/29/2019   Alzheimer's dementia (HCC) 01/05/2019   Depression with anxiety 07/25/2018   Altered mental status 12/16/2017   Spells of speech arrest    HTN (hypertension) 10/15/2017   Dyslipidemia 10/15/2017   Hx of completed stroke 10/15/2017   Osteoarthritis 10/15/2017   Encounter for therapeutic drug monitoring 09/13/2017   Chronic anticoagulation 09/05/2017   Aortic valve replaced 09/05/2017    Orientation RESPIRATION BLADDER Height & Weight     Self  Normal Incontinent Weight:   Height:     BEHAVIORAL SYMPTOMS/MOOD NEUROLOGICAL BOWEL NUTRITION STATUS        Diet  AMBULATORY STATUS COMMUNICATION OF NEEDS Skin   Limited Assist Verbally Normal, Other (Comment) (Please review H&P)                        Personal Care Assistance Level of Assistance  Bathing, Dressing Bathing Assistance: Limited assistance   Dressing Assistance: Limited assistance     Functional Limitations Info  Speech     Speech Info: Impaired    SPECIAL CARE FACTORS FREQUENCY  OT (By licensed OT), PT (By licensed PT)     PT Frequency: 3 to 5 times weekly OT Frequency: 3 to 5 times weekly            Contractures      Additional Factors Info  Code Status Code Status Info: Full             Current Medications (06/02/2023):  This is the current hospital active medication list Current Facility-Administered Medications  Medication Dose Route Frequency Provider Last Rate Last Admin   0.9 %  sodium chloride infusion   Intravenous Continuous Osvaldo Shipper, MD 75 mL/hr at 06/02/23 0706 New Bag at 06/02/23 0706   acetaminophen (TYLENOL) tablet 650 mg  650 mg Oral Q6H PRN Osvaldo Shipper, MD   650 mg at 06/01/23 1806   donepezil (ARICEPT) tablet 5 mg  5 mg Oral QHS Skip Mayer A, MD   5 mg at 06/01/23 2112   escitalopram (LEXAPRO) tablet 5 mg  5 mg Oral Daily Skip Mayer A, MD   5 mg at 06/02/23 1113   ezetimibe (ZETIA) tablet 10 mg  10 mg Oral Daily Arabella Merles, RPH   10 mg at 06/02/23 1113   HYDROcodone-acetaminophen (NORCO/VICODIN) 5-325 MG per tablet  1-2 tablet  1-2 tablet Oral Q6H PRN Lurline Del, MD   1 tablet at 05/31/23 2259   memantine (NAMENDA) tablet 10 mg  10 mg Oral BID Skip Mayer A, MD   10 mg at 06/02/23 1113   methocarbamol (ROBAXIN) tablet 500 mg  500 mg Oral Q8H PRN Skip Mayer A, MD   500 mg at 05/31/23 2259   Or   methocarbamol (ROBAXIN) 500 mg in dextrose 5 % 50 mL IVPB  500 mg Intravenous Q6H PRN Skip Mayer A, MD       morphine (PF) 2 MG/ML injection 0.5 mg  0.5 mg Intravenous Q2H PRN Skip Mayer A, MD   0.5 mg at 06/01/23 1230   oxybutynin (DITROPAN-XL) 24 hr tablet 5 mg  5 mg Oral QHS Skip Mayer A, MD   5 mg at 06/01/23 2112    simvastatin (ZOCOR) tablet 40 mg  40 mg Oral q1800 Arabella Merles, RPH   40 mg at 06/01/23 1806   [START ON 06/07/2023] Vitamin D (Ergocalciferol) (DRISDOL) 1.25 MG (50000 UNIT) capsule 50,000 Units  50,000 Units Oral Q7 days Lurline Del, MD         Discharge Medications: Please see discharge summary for a list of discharge medications.  Relevant Imaging Results:  Relevant Lab Results:   Additional Information SS#: [id number]  Helene Kelp, LCSW

## 2023-06-02 NOTE — Progress Notes (Signed)
Patient refuses vital signs to be taken, family at bedside and aware of situation.

## 2023-06-02 NOTE — Progress Notes (Signed)
[facility] PROGRESS NOTE   Virginia Franklin QMV:[id number] DOB: 1928-11-22 DOA: 05/31/2023  PCP: Nelwyn Salisbury, MD  Brief History/Interval Summary: 89 y.o. female with medical history significant of  Alzheimer's dementia, Arthritis, OA, CVA ischemic, hemorrhagic CVA 09-04-19 Hypertension, DJD of lumbar spine (hx of stress fracture), Atrial fibrillation, mechanical AVR secondary to rheumatic heart disease on warfarin who presents to ED s/p fall out off bed today with resulting pelvic pain.  She was found to have pubic rami fractures.  She was hospitalized for further management due to inability to ambulate and significant pain.  Consultants: Orthopedics  Procedures: None    Subjective/Interval History: Patient remains pleasantly confused.  Does not appear to be in any discomfort.  Does complain of pain in the pelvic area.     Assessment/Plan:  Pubic rami fracture secondary to mechanical fall/mild pelvic hematoma Seen by orthopedics.  Conservative management being pursued.  Weight-bear as tolerated.  Pain medications can be given as needed. X-rays of the right knee as well as right ankle did not show any fractures. Continue PT and OT.  Normocytic anemia Slight drop in hemoglobin is noted.  Could still be dilutional.  She does have mild pelvic hematoma based on imaging study. Will recheck hemoglobin tomorrow.  Wait for stability before reinitiating warfarin.  INR is still therapeutic.  No need to reverse at this time.    Hypokalemia/mildly elevated creatinine Replace potassium.  IV fluids for 24 hours.  Recheck labs tomorrow.  Alzheimer's dementia Has had progressive cognitive decline based on reports.  Patient has noted to be on Aricept and memantine.    Mechanical aortic valve/persistent atrial fibrillation Heart rate is reasonably well-controlled.   Warfarin currently on hold.  Should be able to resume once hemoglobin reaches stability.   Not noted to be on any  rate limiting drugs.  History of stroke Stable.  History of degenerative disc disease in the lumbar spine Stable.   DVT Prophylaxis: INR was therapeutic Code Status: Full code Family Communication: Daughter was updated yesterday. Disposition Plan: SNF when medically stable  Status is: Inpatient Remains inpatient appropriate because: Pubic rami fracture, ambulatory dysfunction, pain      Medications: Scheduled:  donepezil  5 mg Oral QHS   escitalopram  5 mg Oral Daily   ezetimibe  10 mg Oral Daily   memantine  10 mg Oral BID   oxybutynin  5 mg Oral QHS   simvastatin  40 mg Oral q1800   [START ON 06/07/2023] Vitamin D (Ergocalciferol)  50,000 Units Oral Q7 days   Continuous:  sodium chloride 75 mL/hr at 06/02/23 0706   methocarbamol (ROBAXIN) IV     MWU:[id number], HYDROcodone-acetaminophen, methocarbamol **OR** methocarbamol (ROBAXIN) IV, morphine injection  Antibiotics: Anti-infectives (From admission, onward)    None       Objective:  Vital Signs  Vitals:   06/01/23 2016 06/02/23 0433 06/02/23 0557 06/02/23 0811  BP: (!) 94/47 (!) 127/53  (!) 141/60  Pulse: 67 67  65  Resp: 16 15    Temp: 98 F (36.7 C)  98.2 F (36.8 C) (!) 97.5 F (36.4 C)  TempSrc: Oral   Oral  SpO2: 93% 94%  95%    General appearance: Awake alert.  In no distress Resp: Clear to auscultation bilaterally.  Normal effort Cardio: S1-S2 is normal regular.  No S3-S4.  No rubs murmurs or bruit GI: Abdomen is soft.  Nontender nondistended.  Bowel sounds are present normal.  No masses organomegaly Extremities: No  edema.  Restricted range of motion of lower extremities Neurologic: No neurological deficits.  She is disoriented   Lab Results:  Data Reviewed: I have personally reviewed following labs and reports of the imaging studies  CBC: Recent Labs  Lab 05/31/23 1813 06/01/23 0733 06/02/23 0240  WBC 4.6 3.5* 3.4*  HGB 12.6  12.9 11.3* 10.2*  HCT 38.5  38.0 34.0* 31.2*   MCV 96.5 96.0 97.2  PLT 142* 142* 134*    Basic Metabolic Panel: Recent Labs  Lab 05/31/23 1813 05/31/23 2345 06/01/23 0733 06/02/23 0240  NA 136  138  --  137 134*  K 3.5  3.6  --  3.6 3.4*  CL 104  102  --  102 103  CO2 22  --  23 24  GLUCOSE 145*  148*  --  139* 126*  BUN 16  18  --  22 40*  CREATININE 0.85  0.80  --  0.98 1.07*  CALCIUM 9.2  --  8.8* 8.3*  MG  --  1.9  --   --     GFR: CrCl cannot be calculated (Unknown ideal weight.).   Coagulation Profile: Recent Labs  Lab 05/28/23 1408 05/31/23 1813 06/01/23 0244 06/02/23 0240  INR 3.3* 2.2* 2.2* 2.7*     Radiology Studies: DG Knee Right Port  Result Date: 06/01/2023 CLINICAL DATA:  [id number] Pain 144615 EXAM: PORTABLE RIGHT KNEE - 1-2 VIEW COMPARISON:  None available FINDINGS: No fracture or dislocation. Moderate effusion in suprapatellar bursa. Narrowing of articular cartilage in medial and lateral compartments with significant subchondral sclerosis medially. Marginal spurs from femoral condyles and tibial plateau. Femoral arterial calcifications. IMPRESSION: 1. Moderate effusion without fracture or other acute finding. 2. Tricompartmental osteoarthritis. Electronically Signed   By: Corlis Leak M.D.   On: 06/01/2023 12:39   DG Ankle Complete Right  Result Date: 06/01/2023 CLINICAL DATA:  [id number] Right ankle pain [id number] EXAM: RIGHT ANKLE - COMPLETE 3+ VIEW COMPARISON:  12/04/2017 FINDINGS: Diffuse osteopenia. No definite fracture or dislocation on these nonstandard projections. Small calcaneal spurs. Scattered vascular calcifications in the lower calf. IMPRESSION: Osteopenia without definite fracture. Electronically Signed   By: Corlis Leak M.D.   On: 06/01/2023 12:37   CT ABDOMEN PELVIS WO CONTRAST  Result Date: 05/31/2023 CLINICAL DATA:  Abdominal pain, acute, nonlocalized without contrast. Likely acute right superior pubic ramus fracture on earlier imaging EXAM: CT ABDOMEN AND PELVIS WITHOUT CONTRAST  TECHNIQUE: Multidetector CT imaging of the abdomen and pelvis was performed following the standard protocol without IV contrast. RADIATION DOSE REDUCTION: This exam was performed according to the departmental dose-optimization program which includes automated exposure control, adjustment of the mA and/or kV according to patient size and/or use of iterative reconstruction technique. COMPARISON:  Pelvis x-ray today.  CT 03/29/2019 FINDINGS: Lower chest: No acute abnormality. Hepatobiliary: No focal hepatic abnormality. Gallbladder unremarkable. Pancreas: No focal abnormality or ductal dilatation. Spleen: No focal abnormality.  Normal size. Adrenals/Urinary Tract: Adrenal glands unremarkable. Left adrenal cyst measures 3 cm compared to 1.2 cm previously. No hydronephrosis. Urinary bladder unremarkable. Stomach/Bowel: Left colonic diverticulosis. No active diverticulitis. Stomach and small bowel decompressed. No bowel obstruction. Vascular/Lymphatic: Diffuse aortoiliac atherosclerosis. No aneurysm calcified retroperitoneal lymph nodes are stable. Reproductive: Prior hysterectomy.  No adnexal masses. Other: No free fluid or free air. Musculoskeletal: Right superior and inferior pubic rami fractures. Surrounding hematoma in the right extraperitoneal space. Mild degenerative changes in the hips and lumbar spine. IMPRESSION: Right superior and inferior pubic rami fractures. Surrounding mild  hematoma. Left colonic diverticulosis.  No active diverticulitis. 3 cm cystic lesion in the midpole of the left kidney. This likely reflects cyst, but has enlarged significantly since prior study. This could be further evaluated with elective renal ultrasound. Aortoiliac atherosclerosis. Electronically Signed   By: Charlett Nose M.D.   On: 05/31/2023 22:07   CT Head Wo Contrast  Result Date: 05/31/2023 CLINICAL DATA:  Head trauma, minor (Age >= 65y); Polytrauma, blunt EXAM: CT HEAD WITHOUT CONTRAST CT CERVICAL SPINE WITHOUT CONTRAST  TECHNIQUE: Multidetector CT imaging of the head and cervical spine was performed following the standard protocol without intravenous contrast. Multiplanar CT image reconstructions of the cervical spine were also generated. RADIATION DOSE REDUCTION: This exam was performed according to the departmental dose-optimization program which includes automated exposure control, adjustment of the mA and/or kV according to patient size and/or use of iterative reconstruction technique. COMPARISON:  CT head 05/31/2021, CT angio head and neck 09/04/2019 FINDINGS: CT HEAD FINDINGS Brain: Cerebral ventricle sizes are concordant with the degree of cerebral volume loss. Patchy and confluent areas of decreased attenuation are noted throughout the deep and periventricular white matter of the cerebral hemispheres bilaterally, compatible with chronic microvascular ischemic disease. No evidence of large-territorial acute infarction. No parenchymal hemorrhage. No mass lesion. No extra-axial collection. No mass effect or midline shift. No hydrocephalus. Basilar cisterns are patent. Vascular: No hyperdense vessel. Atherosclerotic calcifications are present within the cavernous internal carotid and vertebral arteries. Skull: No acute fracture or focal lesion. Sinuses/Orbits: Paranasal sinuses and mastoid air cells are clear. Bilateral lens replacement. Otherwise the orbits are unremarkable. Other: None. CT CERVICAL SPINE FINDINGS Alignment: Grade 1 anterolisthesis of C2 on C3. Skull base and vertebrae: Multilevel at least moderate degenerative changes spine. No associated severe osseous neural foraminal or central canal stenosis. No acute fracture. No aggressive appearing focal osseous lesion or focal pathologic process. Soft tissues and spinal canal: No prevertebral fluid or swelling. No visible canal hematoma. Upper chest: Unremarkable. Other: Atherosclerotic plaque of the aortic arch and its main branches. IMPRESSION: 1. No acute  intracranial abnormality. 2. No acute displaced fracture or traumatic listhesis of the cervical spine. 3.  Aortic Atherosclerosis (ICD10-I70.0). Electronically Signed   By: Tish Frederickson M.D.   On: 05/31/2023 18:34   CT Cervical Spine Wo Contrast  Result Date: 05/31/2023 CLINICAL DATA:  Head trauma, minor (Age >= 65y); Polytrauma, blunt EXAM: CT HEAD WITHOUT CONTRAST CT CERVICAL SPINE WITHOUT CONTRAST TECHNIQUE: Multidetector CT imaging of the head and cervical spine was performed following the standard protocol without intravenous contrast. Multiplanar CT image reconstructions of the cervical spine were also generated. RADIATION DOSE REDUCTION: This exam was performed according to the departmental dose-optimization program which includes automated exposure control, adjustment of the mA and/or kV according to patient size and/or use of iterative reconstruction technique. COMPARISON:  CT head 05/31/2021, CT angio head and neck 09/04/2019 FINDINGS: CT HEAD FINDINGS Brain: Cerebral ventricle sizes are concordant with the degree of cerebral volume loss. Patchy and confluent areas of decreased attenuation are noted throughout the deep and periventricular white matter of the cerebral hemispheres bilaterally, compatible with chronic microvascular ischemic disease. No evidence of large-territorial acute infarction. No parenchymal hemorrhage. No mass lesion. No extra-axial collection. No mass effect or midline shift. No hydrocephalus. Basilar cisterns are patent. Vascular: No hyperdense vessel. Atherosclerotic calcifications are present within the cavernous internal carotid and vertebral arteries. Skull: No acute fracture or focal lesion. Sinuses/Orbits: Paranasal sinuses and mastoid air cells are clear. Bilateral lens replacement.  Otherwise the orbits are unremarkable. Other: None. CT CERVICAL SPINE FINDINGS Alignment: Grade 1 anterolisthesis of C2 on C3. Skull base and vertebrae: Multilevel at least moderate  degenerative changes spine. No associated severe osseous neural foraminal or central canal stenosis. No acute fracture. No aggressive appearing focal osseous lesion or focal pathologic process. Soft tissues and spinal canal: No prevertebral fluid or swelling. No visible canal hematoma. Upper chest: Unremarkable. Other: Atherosclerotic plaque of the aortic arch and its main branches. IMPRESSION: 1. No acute intracranial abnormality. 2. No acute displaced fracture or traumatic listhesis of the cervical spine. 3.  Aortic Atherosclerosis (ICD10-I70.0). Electronically Signed   By: Tish Frederickson M.D.   On: 05/31/2023 18:34   DG Femur Min 2 Views Right  Result Date: 05/31/2023 CLINICAL DATA:  trauma EXAM: RIGHT FEMUR 2 VIEWS COMPARISON:  X-ray pelvis/right hip 05/31/2023 FINDINGS: Age-indeterminate, likely acute, superior right pubic rami fracture. There is no evidence of fracture or other focal bone lesions of the right acetabulum or femur. Soft tissues are unremarkable. Vascular calcifications. Severe degenerative changes of the tibiofemoral joint. IMPRESSION: Age indeterminate, likely acute, displaced superior right pubic rami fracture. Electronically Signed   By: Tish Frederickson M.D.   On: 05/31/2023 18:17   DG Pelvis Portable  Result Date: 05/31/2023 CLINICAL DATA:  Abdominal x-ray EXAM: PORTABLE PELVIS 1-2 VIEWS COMPARISON:  CT abdomen pelvis 03/29/2019 FINDINGS: Limited evaluation due to overlapping osseous structures and overlying soft tissues. Mild degenerative changes of the right hip. Likely right superior pubic rami fracture. There is no evidence of pelvic fracture or diastasis. No pelvic bone lesions are seen. Degenerative changes of the visualized lower lumbar spine. Lucency along the left transverse process of L5 level. Calcifications along the midline lower abdomen at the level of the L4-L5 level correlates with findings on the CT abdomen pelvis 03/29/2019. IMPRESSION: 1. Likely right superior  pubic rami fracture. Limited evaluation due to overlapping osseous structures and overlying soft tissues. Limited evaluation on single view radiograph. 2. Lucency along the left transverse process of L5 level. Possible nondisplaced fracture. Electronically Signed   By: Tish Frederickson M.D.   On: 05/31/2023 18:15   DG Chest Portable 1 View  Result Date: 05/31/2023 CLINICAL DATA:  trauma EXAM: PORTABLE CHEST 1 VIEW COMPARISON:  Chest x-ray 09/04/2019 FINDINGS: Loop catheter overlies the upper mediastinum. The heart and mediastinal contours are unchanged. Aortic calcification. No focal consolidation. No pulmonary edema. No pleural effusion. No pneumothorax. No acute osseous abnormality. Severe degenerative changes of the left shoulder. IMPRESSION: 1. No active disease. 2.  Aortic Atherosclerosis (ICD10-I70.0). Electronically Signed   By: Tish Frederickson M.D.   On: 05/31/2023 18:10       LOS: 2 days   Virginia Franklin  [facility] Pager on [email or web address]  06/02/2023, 9:54 AM

## 2023-06-03 DIAGNOSIS — S32501A Unspecified fracture of right pubis, initial encounter for closed fracture: Secondary | ICD-10-CM | POA: Diagnosis not present

## 2023-06-03 DIAGNOSIS — I482 Chronic atrial fibrillation, unspecified: Secondary | ICD-10-CM | POA: Diagnosis not present

## 2023-06-03 LAB — CBC
HCT: 31.9 % — ABNORMAL LOW (ref 36.0–46.0)
Hemoglobin: 10.2 g/dL — ABNORMAL LOW (ref 12.0–15.0)
MCH: 31.4 pg (ref 26.0–34.0)
MCHC: 32 g/dL (ref 30.0–36.0)
MCV: 98.2 fL (ref 80.0–100.0)
Platelets: 118 10*3/uL — ABNORMAL LOW (ref 150–400)
RBC: 3.25 MIL/uL — ABNORMAL LOW (ref 3.87–5.11)
RDW: 14.7 % (ref 11.5–15.5)
WBC: 2.5 10*3/uL — ABNORMAL LOW (ref 4.0–10.5)
nRBC: 0 % (ref 0.0–0.2)

## 2023-06-03 LAB — BASIC METABOLIC PANEL
Anion gap: 9 (ref 5–15)
BUN: 18 mg/dL (ref 8–23)
CO2: 22 mmol/L (ref 22–32)
Calcium: 8.5 mg/dL — ABNORMAL LOW (ref 8.9–10.3)
Chloride: 106 mmol/L (ref 98–111)
Creatinine, Ser: 0.76 mg/dL (ref 0.44–1.00)
GFR, Estimated: >60 mL/min (ref >60)
Glucose, Bld: 120 mg/dL — ABNORMAL HIGH (ref 70–99)
Potassium: 3.9 mmol/L (ref 3.5–5.1)
Sodium: 137 mmol/L (ref 135–145)

## 2023-06-03 LAB — PROTIME-INR
INR: 1.6 — ABNORMAL HIGH (ref 0.8–1.2)
Prothrombin Time: 19.2 seconds — ABNORMAL HIGH (ref 11.4–15.2)

## 2023-06-03 MED ORDER — SENNOSIDES-DOCUSATE SODIUM 8.6-50 MG PO TABS
2.0000 | ORAL_TABLET | Freq: Two times a day (BID) | ORAL | Status: DC
  Administered 2023-06-03 – 2023-06-04 (×3): 2 via ORAL
  Filled 2023-06-03 (×3): qty 2

## 2023-06-03 MED ORDER — ENOXAPARIN SODIUM 60 MG/0.6ML IJ SOSY
1.0000 mg/kg | PREFILLED_SYRINGE | Freq: Two times a day (BID) | INTRAMUSCULAR | Status: DC
  Filled 2023-06-03: qty 0.6

## 2023-06-03 MED ORDER — POLYETHYLENE GLYCOL 3350 17 G PO PACK
17.0000 g | PACK | Freq: Every day | ORAL | Status: DC
  Administered 2023-06-03 – 2023-06-04 (×2): 17 g via ORAL
  Filled 2023-06-03 (×2): qty 1

## 2023-06-03 MED ORDER — WARFARIN - PHARMACIST DOSING INPATIENT: Freq: Every day | Status: DC

## 2023-06-03 MED ORDER — HYDROCODONE-ACETAMINOPHEN 5-325 MG PO TABS
0.5000 | ORAL_TABLET | Freq: Three times a day (TID) | ORAL | Status: DC
  Administered 2023-06-03 – 2023-06-04 (×5): 0.5 via ORAL
  Filled 2023-06-03 (×5): qty 1

## 2023-06-03 MED ORDER — ENOXAPARIN SODIUM 60 MG/0.6ML IJ SOSY
55.0000 mg | PREFILLED_SYRINGE | Freq: Two times a day (BID) | INTRAMUSCULAR | Status: DC
  Administered 2023-06-03 – 2023-06-04 (×3): 55 mg via SUBCUTANEOUS
  Filled 2023-06-03 (×2): qty 0.6

## 2023-06-03 MED ORDER — WARFARIN SODIUM 7.5 MG PO TABS
7.5000 mg | ORAL_TABLET | Freq: Once | ORAL | Status: AC
  Administered 2023-06-03: 7.5 mg via ORAL
  Filled 2023-06-03: qty 1

## 2023-06-03 NOTE — Care Management Important Message (Signed)
Important Message  Patient Details  Name: Virginia Franklin MRN: [id number] Date of Birth: October 05, 1929   Medicare Important Message Given:  Yes     Sherilyn Banker 06/03/2023, 1:12 PM

## 2023-06-03 NOTE — Progress Notes (Signed)
[facility] PROGRESS NOTE   Virginia Franklin ZOX:[id number] DOB: 25-Jan-1929 DOA: 05/31/2023  PCP: Nelwyn Salisbury, MD  Brief History/Interval Summary: 89 y.o. female with medical history significant of  Alzheimer's dementia, Arthritis, OA, CVA ischemic, hemorrhagic CVA 09-04-19 Hypertension, DJD of lumbar spine (hx of stress fracture), Atrial fibrillation, mechanical AVR secondary to rheumatic heart disease on warfarin who presents to ED s/p fall out off bed today with resulting pelvic pain.  She was found to have pubic rami fractures.  She was hospitalized for further management due to inability to ambulate and significant pain.  Consultants: Orthopedics  Procedures: None    Subjective/Interval History: Patient remains pleasantly confused.  Continues to have pain in the pelvic area on the right side.  No other complaints offered.      Assessment/Plan:  Pubic rami fracture secondary to mechanical fall/mild pelvic hematoma Seen by orthopedics.  Conservative management being pursued.  Weight-bear as tolerated.  Pain medications can be given as needed. X-rays of the right knee as well as right ankle did not show any fractures. Continue PT and OT.  Normocytic anemia Slight drop in hemoglobin is noted.  Probably secondary to combination of dilution as well as pelvic hematoma.  Stable this morning.    Hypokalemia/mildly elevated creatinine Supplemented.  Potassium level is normal this morning.  Creatinine is better as well.  Alzheimer's dementia Has had progressive cognitive decline based on reports.  Patient has noted to be on Aricept and memantine.    Mechanical aortic valve/persistent atrial fibrillation Heart rate is reasonably well-controlled.   Since hemoglobin is stable warfarin can be resumed.  INR was noted to be subtherapeutic.  Will bridge with Lovenox.  History of stroke Stable.  History of degenerative disc disease in the lumbar spine Stable.   DVT  Prophylaxis: Started back on warfarin Code Status: Full code Family Communication: No family at bedside Disposition Plan: SNF when medically stable.  Anticipate stability by tomorrow.  Status is: Inpatient Remains inpatient appropriate because: Pubic rami fracture, ambulatory dysfunction, pain      Medications: Scheduled:  donepezil  5 mg Oral QHS   enoxaparin (LOVENOX) injection  1 mg/kg Subcutaneous Q12H   escitalopram  5 mg Oral Daily   ezetimibe  10 mg Oral Daily   feeding supplement  237 mL Oral BID BM   memantine  10 mg Oral BID   multivitamin  1 tablet Oral QHS   oxybutynin  5 mg Oral QHS   simvastatin  40 mg Oral q1800   [START ON 06/07/2023] Vitamin D (Ergocalciferol)  50,000 Units Oral Q7 days   warfarin  7.5 mg Oral ONCE-1600   Warfarin - Pharmacist Dosing Inpatient   Does not apply q1600   Continuous:  methocarbamol (ROBAXIN) IV     WJX:[id number], HYDROcodone-acetaminophen, methocarbamol **OR** methocarbamol (ROBAXIN) IV, morphine injection  Antibiotics: Anti-infectives (From admission, onward)    None       Objective:  Vital Signs  Vitals:   06/02/23 0811 06/02/23 1355 06/03/23 0700 06/03/23 0819  BP: (!) 141/60 129/68  (!) 170/62  Pulse: 65 72  62  Resp:    18  Temp: (!) 97.5 F (36.4 C) 99.4 F (37.4 C)  98.1 F (36.7 C)  TempSrc: Oral Oral    SpO2: 95% 97%  95%  Weight:   56.3 kg   Height:   5\' 6"  (1.676 m)     General appearance: Awake alert.  In no distress Resp: Clear to auscultation bilaterally.  Normal effort Cardio: S1-S2 is normal regular.  No S3-S4.  No rubs murmurs or bruit GI: Abdomen is soft.  Nontender nondistended.  Bowel sounds are present normal.  No masses organomegaly Extremities: Restricted range of motion of the right lower extremity due to pelvic fracture No obvious focal deficits.  Lab Results:  Data Reviewed: I have personally reviewed following labs and reports of the imaging studies  CBC: Recent Labs   Lab 05/31/23 1813 06/01/23 0733 06/02/23 0240 06/03/23 0342  WBC 4.6 3.5* 3.4* 2.5*  HGB 12.6  12.9 11.3* 10.2* 10.2*  HCT 38.5  38.0 34.0* 31.2* 31.9*  MCV 96.5 96.0 97.2 98.2  PLT 142* 142* 134* 118*    Basic Metabolic Panel: Recent Labs  Lab 05/31/23 1813 05/31/23 2345 06/01/23 0733 06/02/23 0240 06/03/23 0342  NA 136  138  --  137 134* 137  K 3.5  3.6  --  3.6 3.4* 3.9  CL 104  102  --  102 103 106  CO2 22  --  23 24 22   GLUCOSE 145*  148*  --  139* 126* 120*  BUN 16  18  --  22 40* 18  CREATININE 0.85  0.80  --  0.98 1.07* 0.76  CALCIUM 9.2  --  8.8* 8.3* 8.5*  MG  --  1.9  --   --   --     GFR: Estimated Creatinine Clearance: 39.9 mL/min (by C-G formula based on SCr of 0.76 mg/dL).   Coagulation Profile: Recent Labs  Lab 05/28/23 1408 05/31/23 1813 06/01/23 0244 06/02/23 0240 06/03/23 0342  INR 3.3* 2.2* 2.2* 2.7* 1.6*     Radiology Studies: No results found.     LOS: 3 days   Naresh Althaus [employer] on [email or web address]  06/03/2023, 9:17 AM

## 2023-06-03 NOTE — Progress Notes (Signed)
ANTICOAGULATION CONSULT NOTE - Initial Consult  Pharmacy Consult for warfarin Indication: mechanical valve  Allergies  Allergen Reactions   Penicillins Anaphylaxis and Swelling    Has patient had a PCN reaction causing immediate rash, facial/tongue/throat swelling, SOB or lightheadedness with hypotension: Yes Has patient had a PCN reaction causing severe rash involving mucus membranes or skin necrosis: No Has patient had a PCN reaction that required hospitalization: No Has patient had a PCN reaction occurring within the last 10 years: No If all of the above answers are "NO", then may proceed with Cephalosporin use.    Patient Measurements: Height: 5\' 6"  (167.6 cm) Weight: 56.3 kg (124 lb 1.9 oz) IBW/kg (Calculated) : 59.3   Vital Signs:    Labs: Recent Labs    06/01/23 0244 06/01/23 0733 06/02/23 0240 06/03/23 0342  HGB  --  11.3* 10.2* 10.2*  HCT  --  34.0* 31.2* 31.9*  PLT  --  142* 134* 118*  LABPROT 24.4*  --  29.0* 19.2*  INR 2.2*  --  2.7* 1.6*  CREATININE  --  0.98 1.07* 0.76    Estimated Creatinine Clearance: 39.9 mL/min (by C-G formula based on SCr of 0.76 mg/dL).   Medical History: Past Medical History:  Diagnosis Date   Alzheimer's dementia (HCC) 2017   Arthritis    OA   Body mass index (BMI) of 22.0-22.9 in adult    Cervical cancer (HCC)    had hysterctomy for   H/O osteoporosis    H/O: CVA (cerebrovascular accident) 2012   , HEMORRHAGIC 09-04-2019 ALSO   H/O: osteoarthritis    Hammertoe of left foot    History of urinary frequency    Hx of diverticulitis of colon    Hypertension    Lumbar stress fracture lL3, 01-2020    Medications:  Medications Prior to Admission  Medication Sig Dispense Refill Last Dose   donepezil (ARICEPT) 5 MG tablet TAKE 1 TABLET AT BEDTIME 90 tablet 3 05/30/2023 at pm   escitalopram (LEXAPRO) 5 MG tablet TAKE 1 TABLET DAILY 90 tablet 3 05/31/2023 at am   ezetimibe-simvastatin (VYTORIN) 10-40 MG tablet Take 1 tablet  by mouth daily. 60 tablet 0 05/31/2023 at am   furosemide (LASIX) 20 MG tablet Take 1 tablet (20 mg total) by mouth daily. 90 tablet 1 05/31/2023 at am   lisinopril (ZESTRIL) 5 MG tablet Take 1 tablet (5 mg total) by mouth daily. 90 tablet 0 05/31/2023 at am   memantine (NAMENDA) 10 MG tablet Take 1 tablet (10 mg total) by mouth 2 (two) times daily. 60 tablet 0 05/31/2023 at am   oxybutynin (DITROPAN-XL) 5 MG 24 hr tablet Take 1 tablet (5 mg total) by mouth at bedtime. 90 tablet 3 05/30/2023 at pm   Vitamin D, Ergocalciferol, (DRISDOL) 1.25 MG (50000 UNIT) CAPS capsule Take 1 capsule (50,000 Units total) by mouth every 7 (seven) days. (Patient taking differently: Take 50,000 Units by mouth every 7 (seven) days. Friday) 12 capsule 3 05/31/2023 at am   warfarin (COUMADIN) 7.5 MG tablet TAKE ONE-HALF (1/2) TO ONE TABLET DAILY AS DIRECTED BY THE COUMADIN CLINIC 70 tablet 3 05/31/2023 at 0930   Scheduled:   donepezil  5 mg Oral QHS   escitalopram  5 mg Oral Daily   ezetimibe  10 mg Oral Daily   feeding supplement  237 mL Oral BID BM   memantine  10 mg Oral BID   multivitamin  1 tablet Oral QHS   oxybutynin  5 mg Oral  QHS   simvastatin  40 mg Oral q1800   [START ON 06/07/2023] Vitamin D (Ergocalciferol)  50,000 Units Oral Q7 days   Assessment: 36 yoF with PMH of Atrial fibrillation, mechanical AVR secondary to rheumatic heart disease on warfarin presented as fall on thinners and mild pelvic hematoma. Pharmacy has been consulted to dose warfarin for mechanical valve. Hgb has been stable at 10.2, INR this AM was 1.6 (previous day 2.7). Per discussion with MD, would like to bridge with lovenox at this time.  PTA warfarin regimen: Take 7.5mg  every day except on Tues/Thurs/Sun take 3.75mg    Goal of Therapy:  INR 2-3 Monitor platelets by anticoagulation protocol: Yes   Plan:  Lovenox 1mg /kg every 12 hours Warfarin 7.5 mg x1  Daily INR  Arabella Merles, PharmD. Clinical Pharmacist 06/03/2023 7:23 AM

## 2023-06-03 NOTE — Progress Notes (Signed)
Physical Therapy Treatment Patient Details Name: Virginia Franklin MRN: [id number] DOB: 25-Sep-1929 Today's Date: 06/03/2023   History of Present Illness 89 y.o. female who presents to ED from [facility] 7/19 s/p fall out off bed today with resulting pelvic pain and R ankle tenderness. Per family she had fall after am med past and was on floor for a few hours.Imaging revealed displaced superior right pubic rami fracture,  QIH:[id number]'O dementia, Arthritis, OA, CVA ischemic, hemorrhagic CVA 09-04-19 Hypertension, DJD of lumbar spine( hx of stress fracture),Atrial fibrillation, mechanical AVR secondary to rheumatic heart disease on warfarin    PT Comments  Pt is demonstrating good progress with functional mobility tolerance independence. She required total assist to roll but only maxAx1 to transition supine <> sit EOB and mod-maxAx1 to transfer to stand from elevated EOB with bil HHA today. Performed supine lower extremity A/AAROM exercises as a warm-up and focused remainder of session on pre-gait training, cuing pt to shift her weight laterally while standing at EOB. She was unsuccessful in fully shifting her weight to advance either leg to step today though. Will continue to follow acutely.     Assistance Recommended at Discharge Frequent or constant Supervision/Assistance  If plan is discharge home, recommend the following:  Can travel by private vehicle    Two people to help with walking and/or transfers;Two people to help with bathing/dressing/bathroom;Assistance with cooking/housework;Assistance with feeding;Direct supervision/assist for medications management;Assist for transportation;Direct supervision/assist for financial management;Help with stairs or ramp for entrance   No  Equipment Recommendations  None recommended by PT    Recommendations for Other Services       Precautions / Restrictions Precautions Precautions: Fall Precaution Comments: hospitalization result of  fall Restrictions Weight Bearing Restrictions: No     Mobility  Bed Mobility Overal bed mobility: Needs Assistance Bed Mobility: Sit to Supine, Supine to Sit, Rolling Rolling: Total assist   Supine to sit: Max assist, HOB elevated Sit to supine: Max assist, HOB elevated   General bed mobility comments: Step-by-step cues provided to move each leg toward and off L EOB with cues for pt to grab L bed rail with bil hands to pull. Assist needed at R leg and trunk to sit up while utilizing the bed pad to pivot her hips. Cues provided to lean laterally to L elbow to descend trunk while assistance was provided at legs to lift back up onto bed to return to supine. Little to no initiation to assist with rolling to L due to R hip pain.    Transfers Overall transfer level: Needs assistance Equipment used: 1 person hand held assist Transfers: Sit to/from Stand Sit to Stand: Max assist, Mod assist, From elevated surface           General transfer comment: Anterior approach with pt holding onto therapist. EOB elevated and hover provided at pt's R knee in case of buckling. Utilized bed pad as sling to lift pt's buttocks up to stand first rep, but then placed hands at pt's waist for additional x2 reps to reduce pressure at painful areas. MaxA to stand the first rep but pt progressed to modA with x2 subsequent reps    Ambulation/Gait             Pre-gait activities: Lateral weight shifting with bil HHA and modA for balance, x2 bouts of ~1-2 min duration each. General Gait Details: Attempted, but unsuccessful with full weight shifting or advancing either leg at EOB with maxA   Stairs  Wheelchair Mobility     Tilt Bed    Modified Rankin (Stroke Patients Only)       Balance Overall balance assessment: Needs assistance Sitting-balance support: Feet supported, Bilateral upper extremity supported Sitting balance-Leahy Scale: Poor Sitting balance - Comments: Pt reliant  on UE support, avoiding leaning to R leg due to pain and thereby leaning to L and posteriorly. MinA for static sitting balance with moments of min guard Postural control: Posterior lean, Left lateral lean Standing balance support: Bilateral upper extremity supported, During functional activity, Reliant on assistive device for balance Standing balance-Leahy Scale: Poor Standing balance comment: reliant on UE support and modA to stand                            Cognition Arousal/Alertness: Awake/alert Behavior During Therapy: WFL for tasks assessed/performed Overall Cognitive Status: History of cognitive impairments - at baseline                                 General Comments: Needs repetition of cues frequently. Did not recall the year she was born        Exercises General Exercises - Lower Extremity Heel Slides: AROM, AAROM, Both, 10 reps, Supine (AROM on L, AAROM on R) Hip ABduction/ADduction: AAROM, Both, 10 reps, Supine    General Comments        Pertinent Vitals/Pain Pain Assessment Pain Assessment: Faces Faces Pain Scale: Hurts even more Pain Location: R hip and LE with movement and weightbearing Pain Descriptors / Indicators: Grimacing, Guarding, Discomfort, Moaning Pain Intervention(s): Limited activity within patient's tolerance, Monitored during session, Repositioned, Patient requesting pain meds-RN notified    Home Living                          Prior Function            PT Goals (current goals can now be found in the care plan section) Acute Rehab PT Goals Patient Stated Goal: to improve PT Goal Formulation: With patient/family Time For Goal Achievement: 06/15/23 Potential to Achieve Goals: Fair Progress towards PT goals: Progressing toward goals    Frequency    Min 3X/week      PT Plan Current plan remains appropriate    Co-evaluation              AM-PAC PT "6 Clicks" Mobility   Outcome Measure   Help needed turning from your back to your side while in a flat bed without using bedrails?: Total Help needed moving from lying on your back to sitting on the side of a flat bed without using bedrails?: A Lot Help needed moving to and from a bed to a chair (including a wheelchair)?: Total Help needed standing up from a chair using your arms (e.g., wheelchair or bedside chair)?: A Lot Help needed to walk in hospital room?: Total Help needed climbing 3-5 steps with a railing? : Total 6 Click Score: 8    End of Session Equipment Utilized During Treatment: Gait belt Activity Tolerance: Patient limited by pain Patient left: in bed;with call bell/phone within reach;with bed alarm set;with family/visitor present Nurse Communication: Patient requests pain meds PT Visit Diagnosis: Unsteadiness on feet (R26.81);Other abnormalities of gait and mobility (R26.89);Muscle weakness (generalized) (M62.81);History of falling (Z91.81);Difficulty in walking, not elsewhere classified (R26.2);Pain Pain - Right/Left: Right Pain - part of body:  Hip;Knee;Ankle and joints of foot     Time: 3875-6433 PT Time Calculation (min) (ACUTE ONLY): 26 min  Charges:    $Gait Training: 8-22 mins $Therapeutic Exercise: 8-22 mins PT General Charges $$ ACUTE PT VISIT: 1 Visit                     Raymond Gurney, PT, DPT Acute Rehabilitation Services  Office: [phone]    Jewel Baize 06/03/2023, 4:43 PM

## 2023-06-03 NOTE — Progress Notes (Signed)
Speech Language Pathology Treatment: Dysphagia  Patient Details Name: Virginia Franklin MRN: [id number] DOB: 11-14-1928 Today's Date: 06/03/2023 Time: 5284-1324 SLP Time Calculation (min) (ACUTE ONLY): 15 min  Assessment / Plan / Recommendation Clinical Impression  Pt seen today for additional PO trials to assess need for MBS. She is currently on Dys 2 diet with nectar thick liquids, with which her family reports no difficulty. During trials of thin liquids, pt had immediate throat clear and cough, which is concerning for aspiration given her acute deconditioning. Purees and solids observed with no overt s/s of dysphagia, although suspect this could be affected by likelihood of fluctuating mentation. SLP provided education regarding process of instrumental swallow study and aspiration precautions. Recommend continuing diet of Dys 2 textures with nectar thick liquids pending results of MBS. SLP will continue to f/u.    HPI HPI: Pt is a 89 yo female presenting to ED 7/19 s/p fall out of bed x5 hours with resulting pelvic pain. CT Abdomen revealed R superior and inferior pubic rami fx with mild surrounding hematoma. Per family reports, pt lives alone in an independent living facility and has had intermittent difficulty swallowing PTA, although has had a significant increase in s/sx of aspiration during the course of this hospitalization. PMH includes Alzheimer's dementia, arthritis, OA, CVA ischemic, hemorrhagic CVA 09/04/19, HTN, DJD of lumbar spine( hx of stress fracture), A-fib, mechanical AVR secondary to rheumatic heart disease on warfarin      SLP Plan  MBS      Recommendations for follow up therapy are one component of a multi-disciplinary discharge planning process, led by the attending physician.  Recommendations may be updated based on patient status, additional functional criteria and insurance authorization.    Recommendations  Diet recommendations: Dysphagia 2 (fine  chop);Nectar-thick liquid Liquids provided via: Cup;Straw Medication Administration: Crushed with puree Supervision: Staff to assist with self feeding;Full supervision/cueing for compensatory strategies Compensations: Small sips/bites;Slow rate;Minimize environmental distractions;Clear throat intermittently Postural Changes and/or Swallow Maneuvers: Seated upright 90 degrees;Upright 30-60 min after meal                  Oral care before and after PO;Oral care BID;Staff/trained caregiver to provide oral care   Frequent or constant Supervision/Assistance Dysphagia, unspecified (R13.10)     MBS     Gwynneth Aliment, M.A., CF-SLP Speech Language Pathology, Acute Rehabilitation Services  Secure Chat preferred [phone]   06/03/2023, 4:19 PM

## 2023-06-04 ENCOUNTER — Inpatient Hospital Stay (HOSPITAL_COMMUNITY): Payer: Medicare HMO

## 2023-06-04 DIAGNOSIS — S32501A Unspecified fracture of right pubis, initial encounter for closed fracture: Secondary | ICD-10-CM | POA: Diagnosis not present

## 2023-06-04 LAB — CBC
HCT: 30.8 % — ABNORMAL LOW (ref 36.0–46.0)
Hemoglobin: 9.9 g/dL — ABNORMAL LOW (ref 12.0–15.0)
MCH: 32 pg (ref 26.0–34.0)
MCHC: 32.1 g/dL (ref 30.0–36.0)
MCV: 99.7 fL (ref 80.0–100.0)
Platelets: 138 10*3/uL — ABNORMAL LOW (ref 150–400)
RBC: 3.09 MIL/uL — ABNORMAL LOW (ref 3.87–5.11)
RDW: 14.9 % (ref 11.5–15.5)
WBC: 2.7 10*3/uL — ABNORMAL LOW (ref 4.0–10.5)
nRBC: 0 % (ref 0.0–0.2)

## 2023-06-04 LAB — PROTIME-INR
INR: 1.7 — ABNORMAL HIGH (ref 0.8–1.2)
Prothrombin Time: 20.6 seconds — ABNORMAL HIGH (ref 11.4–15.2)

## 2023-06-04 MED ORDER — RENA-VITE PO TABS: 1.0000 | ORAL_TABLET | Freq: Every day | ORAL | Status: DC

## 2023-06-04 MED ORDER — ENOXAPARIN SODIUM 60 MG/0.6ML IJ SOSY: 55.0000 mg | PREFILLED_SYRINGE | Freq: Two times a day (BID) | INTRAMUSCULAR | Status: AC

## 2023-06-04 MED ORDER — HYDROCODONE-ACETAMINOPHEN 5-325 MG PO TABS: 0.5000 | ORAL_TABLET | Freq: Three times a day (TID) | ORAL | 0 refills | Status: AC

## 2023-06-04 MED ORDER — FLEET ENEMA 7-19 GM/118ML RE ENEM
1.0000 | ENEMA | Freq: Once | RECTAL | Status: AC
  Administered 2023-06-04: 1 via RECTAL
  Filled 2023-06-04: qty 1

## 2023-06-04 MED ORDER — SENNOSIDES-DOCUSATE SODIUM 8.6-50 MG PO TABS: 2.0000 | ORAL_TABLET | Freq: Two times a day (BID) | ORAL | Status: DC

## 2023-06-04 MED ORDER — METHOCARBAMOL 500 MG PO TABS: 500.0000 mg | ORAL_TABLET | Freq: Three times a day (TID) | ORAL | Status: DC | PRN

## 2023-06-04 MED ORDER — POLYETHYLENE GLYCOL 3350 17 G PO PACK: 17.0000 g | PACK | Freq: Every day | ORAL | Status: DC

## 2023-06-04 MED ORDER — ENSURE ENLIVE PO LIQD: 237.0000 mL | Freq: Two times a day (BID) | ORAL | Status: DC

## 2023-06-04 MED ORDER — HYDROCODONE-ACETAMINOPHEN 5-325 MG PO TABS: 1.0000 | ORAL_TABLET | Freq: Four times a day (QID) | ORAL | 0 refills | Status: DC | PRN

## 2023-06-04 NOTE — TOC Progression Note (Signed)
Transition of Care [facility]) - Progression Note    Patient Details  Name: Virginia Franklin MRN: [id number] Date of Birth: Mar 11, 1929  [facility]) CM/SW Contact  Erin Sons, [place] Phone Number: 06/04/2023, 10:28 AM  Clinical Narrative:     CSW discussed with daughter SNF bed offers. [facility] is not in network with [employer] and [place] does not have any open beds. Daughter and son plan on visiting [facility] tomorrow but daughter would like to move forward with accepting bed at [facility].   Expected Discharge Plan: Skilled Nursing Facility Barriers to Discharge: Continued Medical Work up  Expected Discharge Plan and Services         Expected Discharge Date: 06/04/23                                     Social Determinants of Health (SDOH) Interventions SDOH Screenings   Food Insecurity: No Food Insecurity (12/31/2022)  Housing: Low Risk  (12/31/2022)  Transportation Needs: No Transportation Needs (12/31/2022)  Utilities: Not At Risk (12/31/2022)  Alcohol Screen: Low Risk  (12/31/2022)  Depression (PHQ2-9): Low Risk  (12/31/2022)  Financial Resource Strain: Low Risk  (12/31/2022)  Physical Activity: Inactive (12/31/2022)  Social Connections: Moderately Isolated (12/31/2022)  Stress: No Stress Concern Present (12/31/2022)  Tobacco Use: Low Risk  (02/27/2023)    Readmission Risk Interventions     No data to display

## 2023-06-04 NOTE — Progress Notes (Signed)
Attempted report to [facility] with no answer.  Left message with phone number to return my call.

## 2023-06-04 NOTE — Discharge Summary (Addendum)
[facility]  Physician Discharge Summary   Patient ID: Johnelle Tafolla Utke MRN: [id number] DOB/AGE: 1929-06-16 89 y.o.  Admit date: 05/31/2023 Discharge date:   06/04/2023   PCP: Nelwyn Salisbury, MD  DISCHARGE DIAGNOSES:    Pelvic fracture (HCC) Mechanical aortic valve Persistent atrial fibrillation Normocytic anemia/acute blood loss anemia Alzheimer's dementia History of stroke   RECOMMENDATIONS FOR OUTPATIENT FOLLOW UP: Follow-up with Dr. Jene Every with orthopedics in 1 month for follow-up x-rays Check PT/INR in 2 days and discontinue Lovenox once INR is greater than 2.0.  And then check PT/INR per protocol.   Home Health: SNF Equipment/Devices: None  CODE STATUS: Full code  DISCHARGE CONDITION: fair  Diet recommendation: Regular diet with thin liquids   INITIAL HISTORY:  88 y.o. female with medical history significant of  Alzheimer's dementia, Arthritis, OA, CVA ischemic, hemorrhagic CVA 09-04-19 Hypertension, DJD of lumbar spine (hx of stress fracture), Atrial fibrillation, mechanical AVR secondary to rheumatic heart disease on warfarin who presents to ED s/p fall out off bed today with resulting pelvic pain.  She was found to have pubic rami fractures.  She was hospitalized for further management due to inability to ambulate and significant pain.   Consultants: Orthopedics    HOSPITAL COURSE:    Pubic rami fracture secondary to mechanical fall/mild pelvic hematoma Seen by orthopedics.  Conservative management being pursued.  Weight-bear as tolerated.  Pain medications can be given as needed. X-rays of the right knee as well as right ankle did not show any fractures. Continue PT and OT. Skilled nursing facility is recommended for short-term rehab.   Normocytic anemia/acute blood loss anemia Hemoglobin did drop some but stable for the most part.   Hypokalemia/mildly elevated creatinine Supplemented.  Creatinine improved with gentle hydration.    Alzheimer's dementia Has had progressive cognitive decline based on reports.  Patient has noted to be on Aricept and memantine.     Mechanical aortic valve/persistent atrial fibrillation Heart rate is reasonably well-controlled.  Warfarin has been resumed.  Bridging with Lovenox till INR is therapeutic between 2 and 3.   History of stroke Stable.   History of degenerative disc disease in the lumbar spine Stable.  Patient is stable.  Okay for discharge to SNF when bed is available.    PERTINENT LABS:  The results of significant diagnostics from this hospitalization (including imaging, microbiology, ancillary and laboratory) are listed below for reference.     Labs:   Basic Metabolic Panel: Recent Labs  Lab 05/31/23 1813 05/31/23 2345 06/01/23 0733 06/02/23 0240 06/03/23 0342  NA 136  138  --  137 134* 137  K 3.5  3.6  --  3.6 3.4* 3.9  CL 104  102  --  102 103 106  CO2 22  --  23 24 22   GLUCOSE 145*  148*  --  139* 126* 120*  BUN 16  18  --  22 40* 18  CREATININE 0.85  0.80  --  0.98 1.07* 0.76  CALCIUM 9.2  --  8.8* 8.3* 8.5*  MG  --  1.9  --   --   --    Liver Function Tests: Recent Labs  Lab 06/02/23 0240  AST 20  ALT 17  ALKPHOS 54  BILITOT 0.4  PROT 6.2*  ALBUMIN 3.2*    CBC: Recent Labs  Lab 05/31/23 1813 06/01/23 0733 06/02/23 0240 06/03/23 0342 06/04/23 0128  WBC 4.6 3.5* 3.4* 2.5* 2.7*  HGB 12.6  12.9 11.3* 10.2* 10.2* 9.9*  HCT 38.5  38.0 34.0* 31.2* 31.9* 30.8*  MCV 96.5 96.0 97.2 98.2 99.7  PLT 142* 142* 134* 118* 138*      IMAGING STUDIES DG Knee Right Port  Result Date: 06/01/2023 CLINICAL DATA:  [id number] Pain [id number] EXAM: PORTABLE RIGHT KNEE - 1-2 VIEW COMPARISON:  None available FINDINGS: No fracture or dislocation. Moderate effusion in suprapatellar bursa. Narrowing of articular cartilage in medial and lateral compartments with significant subchondral sclerosis medially. Marginal spurs from femoral condyles and tibial  plateau. Femoral arterial calcifications. IMPRESSION: 1. Moderate effusion without fracture or other acute finding. 2. Tricompartmental osteoarthritis. Electronically Signed   By: Corlis Leak M.D.   On: 06/01/2023 12:39   DG Ankle Complete Right  Result Date: 06/01/2023 CLINICAL DATA:  [id number] Right ankle pain [id number] EXAM: RIGHT ANKLE - COMPLETE 3+ VIEW COMPARISON:  12/04/2017 FINDINGS: Diffuse osteopenia. No definite fracture or dislocation on these nonstandard projections. Small calcaneal spurs. Scattered vascular calcifications in the lower calf. IMPRESSION: Osteopenia without definite fracture. Electronically Signed   By: Corlis Leak M.D.   On: 06/01/2023 12:37   CT ABDOMEN PELVIS WO CONTRAST  Result Date: 05/31/2023 CLINICAL DATA:  Abdominal pain, acute, nonlocalized without contrast. Likely acute right superior pubic ramus fracture on earlier imaging EXAM: CT ABDOMEN AND PELVIS WITHOUT CONTRAST TECHNIQUE: Multidetector CT imaging of the abdomen and pelvis was performed following the standard protocol without IV contrast. RADIATION DOSE REDUCTION: This exam was performed according to the departmental dose-optimization program which includes automated exposure control, adjustment of the mA and/or kV according to patient size and/or use of iterative reconstruction technique. COMPARISON:  Pelvis x-ray today.  CT 03/29/2019 FINDINGS: Lower chest: No acute abnormality. Hepatobiliary: No focal hepatic abnormality. Gallbladder unremarkable. Pancreas: No focal abnormality or ductal dilatation. Spleen: No focal abnormality.  Normal size. Adrenals/Urinary Tract: Adrenal glands unremarkable. Left adrenal cyst measures 3 cm compared to 1.2 cm previously. No hydronephrosis. Urinary bladder unremarkable. Stomach/Bowel: Left colonic diverticulosis. No active diverticulitis. Stomach and small bowel decompressed. No bowel obstruction. Vascular/Lymphatic: Diffuse aortoiliac atherosclerosis. No aneurysm calcified  retroperitoneal lymph nodes are stable. Reproductive: Prior hysterectomy.  No adnexal masses. Other: No free fluid or free air. Musculoskeletal: Right superior and inferior pubic rami fractures. Surrounding hematoma in the right extraperitoneal space. Mild degenerative changes in the hips and lumbar spine. IMPRESSION: Right superior and inferior pubic rami fractures. Surrounding mild hematoma. Left colonic diverticulosis.  No active diverticulitis. 3 cm cystic lesion in the midpole of the left kidney. This likely reflects cyst, but has enlarged significantly since prior study. This could be further evaluated with elective renal ultrasound. Aortoiliac atherosclerosis. Electronically Signed   By: Charlett Nose M.D.   On: 05/31/2023 22:07   CT Head Wo Contrast  Result Date: 05/31/2023 CLINICAL DATA:  Head trauma, minor (Age >= 65y); Polytrauma, blunt EXAM: CT HEAD WITHOUT CONTRAST CT CERVICAL SPINE WITHOUT CONTRAST TECHNIQUE: Multidetector CT imaging of the head and cervical spine was performed following the standard protocol without intravenous contrast. Multiplanar CT image reconstructions of the cervical spine were also generated. RADIATION DOSE REDUCTION: This exam was performed according to the departmental dose-optimization program which includes automated exposure control, adjustment of the mA and/or kV according to patient size and/or use of iterative reconstruction technique. COMPARISON:  CT head 05/31/2021, CT angio head and neck 09/04/2019 FINDINGS: CT HEAD FINDINGS Brain: Cerebral ventricle sizes are concordant with the degree of cerebral volume loss. Patchy and confluent areas of decreased attenuation are noted throughout the deep and periventricular white  matter of the cerebral hemispheres bilaterally, compatible with chronic microvascular ischemic disease. No evidence of large-territorial acute infarction. No parenchymal hemorrhage. No mass lesion. No extra-axial collection. No mass effect or midline  shift. No hydrocephalus. Basilar cisterns are patent. Vascular: No hyperdense vessel. Atherosclerotic calcifications are present within the cavernous internal carotid and vertebral arteries. Skull: No acute fracture or focal lesion. Sinuses/Orbits: Paranasal sinuses and mastoid air cells are clear. Bilateral lens replacement. Otherwise the orbits are unremarkable. Other: None. CT CERVICAL SPINE FINDINGS Alignment: Grade 1 anterolisthesis of C2 on C3. Skull base and vertebrae: Multilevel at least moderate degenerative changes spine. No associated severe osseous neural foraminal or central canal stenosis. No acute fracture. No aggressive appearing focal osseous lesion or focal pathologic process. Soft tissues and spinal canal: No prevertebral fluid or swelling. No visible canal hematoma. Upper chest: Unremarkable. Other: Atherosclerotic plaque of the aortic arch and its main branches. IMPRESSION: 1. No acute intracranial abnormality. 2. No acute displaced fracture or traumatic listhesis of the cervical spine. 3.  Aortic Atherosclerosis (ICD10-I70.0). Electronically Signed   By: Tish Frederickson M.D.   On: 05/31/2023 18:34   CT Cervical Spine Wo Contrast  Result Date: 05/31/2023 CLINICAL DATA:  Head trauma, minor (Age >= 65y); Polytrauma, blunt EXAM: CT HEAD WITHOUT CONTRAST CT CERVICAL SPINE WITHOUT CONTRAST TECHNIQUE: Multidetector CT imaging of the head and cervical spine was performed following the standard protocol without intravenous contrast. Multiplanar CT image reconstructions of the cervical spine were also generated. RADIATION DOSE REDUCTION: This exam was performed according to the departmental dose-optimization program which includes automated exposure control, adjustment of the mA and/or kV according to patient size and/or use of iterative reconstruction technique. COMPARISON:  CT head 05/31/2021, CT angio head and neck 09/04/2019 FINDINGS: CT HEAD FINDINGS Brain: Cerebral ventricle sizes are  concordant with the degree of cerebral volume loss. Patchy and confluent areas of decreased attenuation are noted throughout the deep and periventricular white matter of the cerebral hemispheres bilaterally, compatible with chronic microvascular ischemic disease. No evidence of large-territorial acute infarction. No parenchymal hemorrhage. No mass lesion. No extra-axial collection. No mass effect or midline shift. No hydrocephalus. Basilar cisterns are patent. Vascular: No hyperdense vessel. Atherosclerotic calcifications are present within the cavernous internal carotid and vertebral arteries. Skull: No acute fracture or focal lesion. Sinuses/Orbits: Paranasal sinuses and mastoid air cells are clear. Bilateral lens replacement. Otherwise the orbits are unremarkable. Other: None. CT CERVICAL SPINE FINDINGS Alignment: Grade 1 anterolisthesis of C2 on C3. Skull base and vertebrae: Multilevel at least moderate degenerative changes spine. No associated severe osseous neural foraminal or central canal stenosis. No acute fracture. No aggressive appearing focal osseous lesion or focal pathologic process. Soft tissues and spinal canal: No prevertebral fluid or swelling. No visible canal hematoma. Upper chest: Unremarkable. Other: Atherosclerotic plaque of the aortic arch and its main branches. IMPRESSION: 1. No acute intracranial abnormality. 2. No acute displaced fracture or traumatic listhesis of the cervical spine. 3.  Aortic Atherosclerosis (ICD10-I70.0). Electronically Signed   By: Tish Frederickson M.D.   On: 05/31/2023 18:34   DG Femur Min 2 Views Right  Result Date: 05/31/2023 CLINICAL DATA:  trauma EXAM: RIGHT FEMUR 2 VIEWS COMPARISON:  X-ray pelvis/right hip 05/31/2023 FINDINGS: Age-indeterminate, likely acute, superior right pubic rami fracture. There is no evidence of fracture or other focal bone lesions of the right acetabulum or femur. Soft tissues are unremarkable. Vascular calcifications. Severe  degenerative changes of the tibiofemoral joint. IMPRESSION: Age indeterminate, likely acute, displaced superior right  pubic rami fracture. Electronically Signed   By: Tish Frederickson M.D.   On: 05/31/2023 18:17   DG Pelvis Portable  Result Date: 05/31/2023 CLINICAL DATA:  Abdominal x-ray EXAM: PORTABLE PELVIS 1-2 VIEWS COMPARISON:  CT abdomen pelvis 03/29/2019 FINDINGS: Limited evaluation due to overlapping osseous structures and overlying soft tissues. Mild degenerative changes of the right hip. Likely right superior pubic rami fracture. There is no evidence of pelvic fracture or diastasis. No pelvic bone lesions are seen. Degenerative changes of the visualized lower lumbar spine. Lucency along the left transverse process of L5 level. Calcifications along the midline lower abdomen at the level of the L4-L5 level correlates with findings on the CT abdomen pelvis 03/29/2019. IMPRESSION: 1. Likely right superior pubic rami fracture. Limited evaluation due to overlapping osseous structures and overlying soft tissues. Limited evaluation on single view radiograph. 2. Lucency along the left transverse process of L5 level. Possible nondisplaced fracture. Electronically Signed   By: Tish Frederickson M.D.   On: 05/31/2023 18:15   DG Chest Portable 1 View  Result Date: 05/31/2023 CLINICAL DATA:  trauma EXAM: PORTABLE CHEST 1 VIEW COMPARISON:  Chest x-ray 09/04/2019 FINDINGS: Loop catheter overlies the upper mediastinum. The heart and mediastinal contours are unchanged. Aortic calcification. No focal consolidation. No pulmonary edema. No pleural effusion. No pneumothorax. No acute osseous abnormality. Severe degenerative changes of the left shoulder. IMPRESSION: 1. No active disease. 2.  Aortic Atherosclerosis (ICD10-I70.0). Electronically Signed   By: Tish Frederickson M.D.   On: 05/31/2023 18:10    DISCHARGE EXAMINATION: Vitals:   06/03/23 0700 06/03/23 0819 06/03/23 2034 06/04/23 0427  BP:  (!) 170/62 (!) 148/50  (!) 160/57  Pulse:  62 60 65  Resp:  18 18 18   Temp:  98.1 F (36.7 C) 98.2 F (36.8 C) (!) 97.5 F (36.4 C)  TempSrc:      SpO2:  95% 97% 96%  Weight: 56.3 kg     Height: 5\' 6"  (1.676 m)      General appearance: Awake alert.  In no distress Resp: Clear to auscultation bilaterally.  Normal effort Cardio: S1-S2 is normal regular.  No S3-S4.  No rubs murmurs or bruit GI: Abdomen is soft.  Nontender nondistended.  Bowel sounds are present normal.  No masses organomegaly   DISPOSITION: SNF  Discharge Instructions     Call MD for:  difficulty breathing, headache or visual disturbances   Complete by: As directed    Call MD for:  extreme fatigue   Complete by: As directed    Call MD for:  persistant dizziness or light-headedness   Complete by: As directed    Call MD for:  persistant nausea and vomiting   Complete by: As directed    Call MD for:  severe uncontrolled pain   Complete by: As directed    Call MD for:  temperature >100.4   Complete by: As directed    Discharge instructions   Complete by: As directed    Please review instructions on the discharge summary.  You were cared for by a hospitalist during your hospital stay. If you have any questions about your discharge medications or the care you received while you were in the hospital after you are discharged, you can call the unit and asked to speak with the hospitalist on call if the hospitalist that took care of you is not available. Once you are discharged, your primary care physician will handle any further medical issues. Please note that NO REFILLS for  any discharge medications will be authorized once you are discharged, as it is imperative that you return to your primary care physician (or establish a relationship with a primary care physician if you do not have one) for your aftercare needs so that they can reassess your need for medications and monitor your lab values. If you do not have a primary care physician, you  can call [phone] for a physician referral.   Increase activity slowly   Complete by: As directed          Allergies as of 06/04/2023       Reactions   Penicillins Anaphylaxis, Swelling   Has patient had a PCN reaction causing immediate rash, facial/tongue/throat swelling, SOB or lightheadedness with hypotension: Yes Has patient had a PCN reaction causing severe rash involving mucus membranes or skin necrosis: No Has patient had a PCN reaction that required hospitalization: No Has patient had a PCN reaction occurring within the last 10 years: No If all of the above answers are "NO", then may proceed with Cephalosporin use.        Medication List     STOP taking these medications    furosemide 20 MG tablet Commonly known as: LASIX       TAKE these medications    donepezil 5 MG tablet Commonly known as: ARICEPT TAKE 1 TABLET AT BEDTIME   enoxaparin 60 MG/0.6ML injection Commonly known as: LOVENOX Inject 0.55 mLs (55 mg total) into the skin every 12 (twelve) hours for 4 days. May discontinue once INR is greater than 2.   escitalopram 5 MG tablet Commonly known as: LEXAPRO TAKE 1 TABLET DAILY   ezetimibe-simvastatin 10-40 MG tablet Commonly known as: VYTORIN Take 1 tablet by mouth daily.   feeding supplement Liqd Take 237 mLs by mouth 2 (two) times daily between meals.   HYDROcodone-acetaminophen 5-325 MG tablet Commonly known as: NORCO/VICODIN Take 1-2 tablets by mouth every 6 (six) hours as needed for severe pain.   HYDROcodone-acetaminophen 5-325 MG tablet Commonly known as: NORCO/VICODIN Take 0.5 tablets by mouth 3 (three) times daily for 3 days.   lisinopril 5 MG tablet Commonly known as: ZESTRIL Take 1 tablet (5 mg total) by mouth daily.   memantine 10 MG tablet Commonly known as: NAMENDA TAKE 1 TABLET BY MOUTH TWICE A DAY   methocarbamol 500 MG tablet Commonly known as: ROBAXIN Take 1 tablet (500 mg total) by mouth every 8 (eight) hours as  needed for muscle spasms.   multivitamin Tabs tablet Take 1 tablet by mouth at bedtime.   oxybutynin 5 MG 24 hr tablet Commonly known as: DITROPAN-XL Take 1 tablet (5 mg total) by mouth at bedtime.   polyethylene glycol 17 g packet Commonly known as: MIRALAX / GLYCOLAX Take 17 g by mouth daily.   senna-docusate 8.6-50 MG tablet Commonly known as: Senokot-S Take 2 tablets by mouth 2 (two) times daily.   Vitamin D (Ergocalciferol) 1.25 MG (50000 UNIT) Caps capsule Commonly known as: DRISDOL Take 1 capsule (50,000 Units total) by mouth every 7 (seven) days. What changed: additional instructions   warfarin 7.5 MG tablet Commonly known as: COUMADIN Take as directed. If you are unsure how to take this medication, talk to your nurse or doctor. Original instructions: TAKE ONE-HALF (1/2) TO ONE TABLET DAILY AS DIRECTED BY THE COUMADIN CLINIC          Follow-up Information     Jene Every, MD. Schedule an appointment as soon as possible for a visit in  1 month(s).   Specialty: Orthopedic Surgery Why: post hospitalization follow up Contact information: [address] [place] [address] [phone]                 TOTAL DISCHARGE TIME: 35 minutes  Charmon Thorson Rito Ehrlich  [facility] Pager on [email or web address]  06/04/2023, 9:05 AM

## 2023-06-04 NOTE — TOC Transition Note (Signed)
Transition of Care [facility]) - CM/SW Discharge Note   Patient Details  Name: Virginia Franklin MRN: [id number] Date of Birth: 01-16-1929  [facility]) CM/SW Contact:  Erin Sons, LCSW Phone Number: 06/04/2023, 3:22 PM   Clinical Narrative:     Patient will DC to: [facility] Anticipated DC date: 06/04/23 Family notified: Daughter Transport by: Sharin Mons   Per MD patient ready for DC to [facility]. RN, patient, patient's family, and facility notified of DC. Discharge Summary and FL2 sent to facility. RN to call report prior to discharge [phone]). DC packet on chart. Ambulance transport requested for patient.   CSW will sign off for now as social work intervention is no longer needed. Please consult [place] again if new needs arise.    Final next level of care: Skilled Nursing Facility Barriers to Discharge: No Barriers Identified   Patient Goals and CMS Choice CMS Medicare.gov Compare Post Acute Care list provided to:: Patient Choice offered to / list presented to : Patient  Discharge Placement                Patient chooses bed at: [facility] Patient to be transferred to facility by: [facility] Name of family member notified: Larence Penning (Daughter)  [phone] (Mobile) Patient and family notified of of transfer: 06/04/23  Discharge Plan and Services Additional resources added to the After Visit Summary for                                       Social Determinants of Health (SDOH) Interventions SDOH Screenings   Food Insecurity: No Food Insecurity (12/31/2022)  Housing: Low Risk  (12/31/2022)  Transportation Needs: No Transportation Needs (12/31/2022)  Utilities: Not At Risk (12/31/2022)  Alcohol Screen: Low Risk  (12/31/2022)  Depression (PHQ2-9): Low Risk  (12/31/2022)  Financial Resource Strain: Low Risk  (12/31/2022)  Physical Activity: Inactive (12/31/2022)  Social Connections: Moderately Isolated (12/31/2022)  Stress: No Stress Concern Present  (12/31/2022)  Tobacco Use: Low Risk  (02/27/2023)     Readmission Risk Interventions     No data to display

## 2023-06-04 NOTE — Progress Notes (Signed)
Modified Barium Swallow Study  Patient Details  Name: Virginia Franklin MRN: [id number] Date of Birth: 1929/03/28  Today's Date: 06/04/2023  Modified Barium Swallow completed.  Full report located under Chart Review in the Imaging Section.  History of Present Illness Pt is a 89 yo female presenting to ED 7/19 s/p fall out of bed x5 hours with resulting pelvic pain. CT Abdomen revealed R superior and inferior pubic rami fx with mild surrounding hematoma. Per family reports, pt lives alone in an independent living facility and has had intermittent difficulty swallowing PTA, although has had a significant increase in s/sx of aspiration during the course of this hospitalization. PMH includes Alzheimer's dementia, arthritis, OA, CVA ischemic, hemorrhagic CVA 09/04/19, HTN, DJD of lumbar spine( hx of stress fracture), A-fib, mechanical AVR secondary to rheumatic heart disease on warfarin   Clinical Impression Pt presents with a mild dysphagia characterized by trace oropharyngeal residue. This residue does increase with thicker consistencies, although this is cleared with subsequent dry swallows, which she initiates independently. Note pt has suspected cervical osteophytes as well as a prominent CP bar, although this does not appear to affect pharyngeal clearance. Overall, timing and coordination appear intact resulting in complete laryngeal vestibule closure and no penetration/aspiration with any consistency administered. The pill was administered with thin liquids. After the initial swallow, pill appeared to remain in the valleculae, but consecutive sips of thin liquids effectively cleared it completely. Pt may continue to experience some minor fluctuations in mentation, although does appear to be more stable and at her baseline. Recommend regular diet with thin liquids only when pt is fully awake and alert. Meds can be given whole with liquids. SLP will f/u to ensure safety with this diet as  able.  Factors that may increase risk of adverse event in presence of aspiration Rubye Oaks & Clearance Coots 2021): Reduced cognitive function;Limited mobility  Swallow Evaluation Recommendations Recommendations: PO diet PO Diet Recommendation: Regular;Thin liquids (Level 0) Liquid Administration via: Cup;Straw Medication Administration: Whole meds with liquid Supervision: Patient able to self-feed;Intermittent supervision/cueing for swallowing strategies Swallowing strategies  : Minimize environmental distractions;Slow rate;Small bites/sips Postural changes: Position pt fully upright for meals;Stay upright 30-60 min after meals Oral care recommendations: Oral care BID (2x/day)     Gwynneth Aliment, M.A., CF-SLP Speech Language Pathology, Acute Rehabilitation Services  Secure Chat preferred [phone]  06/04/2023,1:42 PM

## 2023-06-04 NOTE — Plan of Care (Signed)

## 2023-06-05 DIAGNOSIS — I10 Essential (primary) hypertension: Secondary | ICD-10-CM | POA: Diagnosis not present

## 2023-06-05 DIAGNOSIS — Z4789 Encounter for other orthopedic aftercare: Secondary | ICD-10-CM

## 2023-06-05 DIAGNOSIS — R2689 Other abnormalities of gait and mobility: Secondary | ICD-10-CM

## 2023-06-05 DIAGNOSIS — E871 Hypo-osmolality and hyponatremia: Secondary | ICD-10-CM | POA: Diagnosis not present

## 2023-06-05 DIAGNOSIS — M1990 Unspecified osteoarthritis, unspecified site: Secondary | ICD-10-CM

## 2023-06-05 DIAGNOSIS — M6281 Muscle weakness (generalized): Secondary | ICD-10-CM

## 2023-06-05 DIAGNOSIS — R2681 Unsteadiness on feet: Secondary | ICD-10-CM

## 2023-06-05 DIAGNOSIS — F0280 Dementia in other diseases classified elsewhere without behavioral disturbance: Secondary | ICD-10-CM | POA: Diagnosis not present

## 2023-06-05 DIAGNOSIS — F418 Other specified anxiety disorders: Secondary | ICD-10-CM | POA: Diagnosis not present

## 2023-06-05 DIAGNOSIS — E785 Hyperlipidemia, unspecified: Secondary | ICD-10-CM

## 2023-06-05 DIAGNOSIS — Z7901 Long term (current) use of anticoagulants: Secondary | ICD-10-CM

## 2023-06-06 DIAGNOSIS — G301 Alzheimer's disease with late onset: Secondary | ICD-10-CM | POA: Diagnosis not present

## 2023-06-06 DIAGNOSIS — I4819 Other persistent atrial fibrillation: Secondary | ICD-10-CM | POA: Diagnosis not present

## 2023-06-06 DIAGNOSIS — Z952 Presence of prosthetic heart valve: Secondary | ICD-10-CM | POA: Diagnosis not present

## 2023-06-06 DIAGNOSIS — I10 Essential (primary) hypertension: Secondary | ICD-10-CM | POA: Diagnosis not present

## 2023-06-06 DIAGNOSIS — S32501D Unspecified fracture of right pubis, subsequent encounter for fracture with routine healing: Secondary | ICD-10-CM

## 2023-06-10 DIAGNOSIS — F0280 Dementia in other diseases classified elsewhere without behavioral disturbance: Secondary | ICD-10-CM

## 2023-06-10 DIAGNOSIS — R2689 Other abnormalities of gait and mobility: Secondary | ICD-10-CM

## 2023-06-10 DIAGNOSIS — Z4789 Encounter for other orthopedic aftercare: Secondary | ICD-10-CM

## 2023-06-10 DIAGNOSIS — R2681 Unsteadiness on feet: Secondary | ICD-10-CM

## 2023-06-10 DIAGNOSIS — M1990 Unspecified osteoarthritis, unspecified site: Secondary | ICD-10-CM

## 2023-06-10 DIAGNOSIS — F418 Other specified anxiety disorders: Secondary | ICD-10-CM

## 2023-06-10 DIAGNOSIS — Z7901 Long term (current) use of anticoagulants: Secondary | ICD-10-CM

## 2023-06-10 DIAGNOSIS — M6281 Muscle weakness (generalized): Secondary | ICD-10-CM

## 2023-06-18 ENCOUNTER — Telehealth: Payer: Self-pay | Admitting: *Deleted

## 2023-06-18 NOTE — Telephone Encounter (Signed)
Pt's dtr called to report she had a fall and has a broken bone and has been sent to Rehab at [facility]. Advised to call once back in normal facility. Also, advised at discharge at facility to obtain recent doses and she verbalized understanding.

## 2023-06-25 ENCOUNTER — Telehealth: Payer: Self-pay | Admitting: Family Medicine

## 2023-06-25 ENCOUNTER — Ambulatory Visit: Payer: Medicare HMO

## 2023-06-25 NOTE — Telephone Encounter (Addendum)
Adrianne (In the Sales Dept at [facility]) [phone]  Called to say Pt fractured her pelvis, and will be receiving services from [facility]  Wanted to make sure MD will oversee plan of care (home health), but it looks like Pt has also been seeing Dr. Eloisa Northern (Internal Medicine) in [place] since early August 2024.

## 2023-06-25 NOTE — Telephone Encounter (Signed)
Adrianne (In the Sales Dept at [facility])  Is aware and will let the patient know that she needs to schedule an appointment with Dr Clent Ridges.

## 2023-06-25 NOTE — Telephone Encounter (Signed)
I would be happy to oversee her care plan, but she needs to decide which PCP she is going to see

## 2023-06-27 ENCOUNTER — Telehealth: Payer: Self-pay | Admitting: Physician Assistant

## 2023-06-27 ENCOUNTER — Telehealth: Payer: Self-pay | Admitting: Family Medicine

## 2023-06-27 NOTE — Telephone Encounter (Signed)
Wants provider to know patient has not had a bowel movement in over 2 days. Daughter feels someone should do a PTINR, feels it hasn't been checked. Nurse is making home visit tomorrow. Requesting VO for [facility]

## 2023-06-27 NOTE — Telephone Encounter (Signed)
FYI: Pts daughter is calling in stating that the pt will not be coming to her 08/01/2023 at 1:00 appt with Huntley Dec due to her falling and breaking her hip she is living with her daughter pt is not able to get up and walk per the daughter.

## 2023-06-28 ENCOUNTER — Encounter: Payer: Self-pay | Admitting: Family Medicine

## 2023-06-28 ENCOUNTER — Telehealth (INDEPENDENT_AMBULATORY_CARE_PROVIDER_SITE_OTHER): Payer: Medicare HMO | Admitting: Family Medicine

## 2023-06-28 DIAGNOSIS — F02B0 Dementia in other diseases classified elsewhere, moderate, without behavioral disturbance, psychotic disturbance, mood disturbance, and anxiety: Secondary | ICD-10-CM

## 2023-06-28 DIAGNOSIS — S32501A Unspecified fracture of right pubis, initial encounter for closed fracture: Secondary | ICD-10-CM

## 2023-06-28 DIAGNOSIS — R630 Anorexia: Secondary | ICD-10-CM | POA: Diagnosis not present

## 2023-06-28 MED ORDER — MIRTAZAPINE 15 MG PO TBDP: 15.0000 mg | ORAL_TABLET | Freq: Every day | ORAL | 3 refills | Status: DC

## 2023-06-28 MED ORDER — FUROSEMIDE 20 MG PO TABS: 20.0000 mg | ORAL_TABLET | Freq: Every day | ORAL | 3 refills | Status: DC

## 2023-06-28 NOTE — Telephone Encounter (Signed)
Pt had a virtual appointment with Dr Clent Ridges this afternoon

## 2023-06-28 NOTE — Progress Notes (Signed)
Subjective:    Patient ID: Virginia Franklin, female    DOB: March 08, 1929, 89 y.o.   MRN: [id number]  HPI Virtual Visit via Video Note  I connected with the patient on 06/28/23 at  1:15 PM EDT by a video enabled telemedicine application and verified that I am speaking with the correct person using two identifiers.  Location patient: home Location provider:work or home office Persons participating in the virtual visit: patient, provider  I discussed the limitations of evaluation and management by telemedicine and the availability of in person appointments. The patient expressed understanding and agreed to proceed.   HPI: Here with her daughter to follow up on a hospital stay from 05-1923 to 06-04-23 for bilateral pubic rami fractures from a fall. Conservative treatment was recommended. She received PT and OT, and her pain was controlled with hydrocodone. From the hospital she went to [facility] for rehab, and now she has been home since 06-25-23. Unfortunately since getting home, she has not walked or even stood on her feet at all due to pain. She can sit up for a few minutes but then she has to lie back in bed. She is taking Norco 5-325 a few times a day. Her appetite is very poor, and she will not eat much food. She drinks fluids with a lot of encouragement.    ROS: See pertinent positives and negatives per HPI.  Past Medical History:  Diagnosis Date   Alzheimer's dementia (HCC) 2017   Arthritis    OA   Body mass index (BMI) of 22.0-22.9 in adult    Cervical cancer (HCC)    had hysterctomy for   H/O osteoporosis    H/O: CVA (cerebrovascular accident) 2012   , HEMORRHAGIC 09-04-2019 ALSO   H/O: osteoarthritis    Hammertoe of left foot    History of urinary frequency    Hx of diverticulitis of colon    Hypertension    Lumbar stress fracture lL3, 01-2020    Past Surgical History:  Procedure Laterality Date   ABDOMINAL HYSTERECTOMY     CATARACT EXTRACTION Bilateral    HAMMER  TOE SURGERY Left 07/29/2020   Procedure: HAMMER TOE REPAIR 2ND TOE LEFT;  Surgeon: Edwin Cap, DPM;  Location: [facility] [place];  Service: Podiatry;  Laterality: Left;   INCISION AND DRAINAGE Bilateral 07/29/2020   Procedure: EXCISION OF WOUND OF LEFT SECOND TOE; RELEASE OF TENDONS LEFT THIRD TOE, RIGHT THIRD TOE;  Surgeon: Edwin Cap, DPM;  Location: [facility] [place];  Service: Podiatry;  Laterality: Bilateral;   mechanical heart valve  1990   st jude done in [place]   SMALL INTESTINE SURGERY      Family History  Problem Relation Age of Onset   Kidney disease Mother        MALIGNANT NEOPLASM   Arthritis Father    Heart Problems Father    Lung disease Brother    Heart disease Neg Hx      Current Outpatient Medications:    donepezil (ARICEPT) 5 MG tablet, TAKE 1 TABLET AT BEDTIME, Disp: 90 tablet, Rfl: 3   enoxaparin (LOVENOX) 60 MG/0.6ML injection, Inject 0.55 mLs (55 mg total) into the skin every 12 (twelve) hours for 4 days. May discontinue once INR is greater than 2., Disp: , Rfl:    escitalopram (LEXAPRO) 5 MG tablet, TAKE 1 TABLET DAILY, Disp: 90 tablet, Rfl: 3   ezetimibe-simvastatin (VYTORIN) 10-40 MG tablet, Take 1 tablet by mouth daily., Disp: 60  tablet, Rfl: 0   feeding supplement (ENSURE ENLIVE / ENSURE PLUS) LIQD, Take 237 mLs by mouth 2 (two) times daily between meals., Disp: , Rfl:    HYDROcodone-acetaminophen (NORCO/VICODIN) 5-325 MG tablet, Take 1-2 tablets by mouth every 6 (six) hours as needed for severe pain., Disp: 20 tablet, Rfl: 0   lisinopril (ZESTRIL) 5 MG tablet, Take 1 tablet (5 mg total) by mouth daily., Disp: 90 tablet, Rfl: 0   memantine (NAMENDA) 10 MG tablet, TAKE 1 TABLET BY MOUTH TWICE A DAY, Disp: 180 tablet, Rfl: 1   methocarbamol (ROBAXIN) 500 MG tablet, Take 1 tablet (500 mg total) by mouth every 8 (eight) hours as needed for muscle spasms., Disp: , Rfl:    multivitamin (RENA-VIT) TABS tablet, Take 1 tablet by mouth  at bedtime., Disp: , Rfl:    oxybutynin (DITROPAN-XL) 5 MG 24 hr tablet, Take 1 tablet (5 mg total) by mouth at bedtime., Disp: 90 tablet, Rfl: 3   polyethylene glycol (MIRALAX / GLYCOLAX) 17 g packet, Take 17 g by mouth daily., Disp: , Rfl:    senna-docusate (SENOKOT-S) 8.6-50 MG tablet, Take 2 tablets by mouth 2 (two) times daily., Disp: , Rfl:    Vitamin D, Ergocalciferol, (DRISDOL) 1.25 MG (50000 UNIT) CAPS capsule, Take 1 capsule (50,000 Units total) by mouth every 7 (seven) days. (Patient taking differently: Take 50,000 Units by mouth every 7 (seven) days. Friday), Disp: 12 capsule, Rfl: 3   warfarin (COUMADIN) 7.5 MG tablet, TAKE ONE-HALF (1/2) TO ONE TABLET DAILY AS DIRECTED BY THE COUMADIN CLINIC, Disp: 70 tablet, Rfl: 3  EXAM:  VITALS per patient if applicable:  GENERAL: alert, lying in bed   HEENT: atraumatic, conjunttiva clear, no obvious abnormalities on inspection of external nose and ears  NECK: normal movements of the head and neck  LUNGS: on inspection no signs of respiratory distress, breathing rate appears normal, no obvious gross SOB, gasping or wheezing  CV: no obvious cyanosis  MS: moves all visible extremities without noticeable abnormality  PSYCH/NEURO: pleasant and cooperative, no obvious depression or anxiety, speech and thought processing grossly intact  ASSESSMENT AND PLAN: She is recovering from pubic rami fractures, and she is still in some pain. This will be controlled with Norco and with Tylenol. She is to begin home PT and OT next week. Her appetite is poor, so we will start her on Mirtazepine 15 mg at bedtime. Follow up in a month. We spent a total of (35   ) minutes reviewing records and discussing these issues.  Gershon Crane, MD  Discussed the following assessment and plan:  No diagnosis found.     I discussed the assessment and treatment plan with the patient. The patient was provided an opportunity to ask questions and all were answered. The  patient agreed with the plan and demonstrated an understanding of the instructions.   The patient was advised to call back or seek an in-person evaluation if the symptoms worsen or if the condition fails to improve as anticipated.      Review of Systems     Objective:   Physical Exam        Assessment & Plan:

## 2023-07-02 ENCOUNTER — Telehealth: Payer: Self-pay | Admitting: Family Medicine

## 2023-07-02 ENCOUNTER — Other Ambulatory Visit: Payer: Self-pay | Admitting: Family Medicine

## 2023-07-02 NOTE — Telephone Encounter (Signed)
Requesting frequency of her INR testing

## 2023-07-02 NOTE — Telephone Encounter (Signed)
That would be up to the Coumadin nurse

## 2023-07-03 ENCOUNTER — Encounter: Payer: Self-pay | Admitting: Family Medicine

## 2023-07-03 NOTE — Telephone Encounter (Signed)
Spoke with pt daughter Lurena Joiner aware that Dr Clent Ridges is out of the office until 07/08/23, Requests for Dr Clent Ridges to review pt medications Oxybutynin, Vytonin and Furosemide. States that she does not think pt needs a diuretic since she is bed ridden and pt is also experiencing constipation from some of this medications. Pt daughter requests for Dr Clent Ridges to review pt med's and call he back with advise. Declined a visit

## 2023-07-03 NOTE — Telephone Encounter (Signed)
Please advise 

## 2023-07-04 NOTE — Telephone Encounter (Signed)
Called [facility] to follow-up, hasd to leave another message for a clinical nurse or case manager to return call to our clinic.  Will await call back.

## 2023-07-04 NOTE — Telephone Encounter (Signed)
This pt's warfarin is managed by cardiology.  Forwarding msg to cardiology pool.

## 2023-07-04 NOTE — Telephone Encounter (Signed)
Demetria, RN from [facility] returned call and stated there was an order for Home Health visit tomorrow, 07/05/23 or Saturday. I requested  that INR be tested tomorrow since our Anticoagulation Clinic is only open Monday-Friday. Provided her with order to check INR at visit and also provided direct phone number to call while at pt's home to report INR [phone]). Demetria verbalized understanding and stated the nurse would call with INR results.

## 2023-07-04 NOTE — Telephone Encounter (Signed)
Number for [place] with [facility], [phone] cannot be completed as dialed.  Called [facility] main number, left message with operator for a case manager to call back.  They are in a meeting at present will await call back to give orders for INR checks.

## 2023-07-05 ENCOUNTER — Telehealth: Payer: Self-pay | Admitting: Family Medicine

## 2023-07-05 ENCOUNTER — Ambulatory Visit (INDEPENDENT_AMBULATORY_CARE_PROVIDER_SITE_OTHER): Payer: Medicare HMO

## 2023-07-05 DIAGNOSIS — Z952 Presence of prosthetic heart valve: Secondary | ICD-10-CM | POA: Diagnosis not present

## 2023-07-05 DIAGNOSIS — Z5181 Encounter for therapeutic drug level monitoring: Secondary | ICD-10-CM | POA: Diagnosis not present

## 2023-07-05 DIAGNOSIS — Z7901 Long term (current) use of anticoagulants: Secondary | ICD-10-CM | POA: Diagnosis not present

## 2023-07-05 LAB — POCT INR: INR: 8 — AB (ref 2.0–3.0)

## 2023-07-05 NOTE — Patient Instructions (Signed)
Description   HOLD Warfarin until INR is checked on Monday, 07/08/23. Eat greens and drink Ensure.  Seek immediate medical attention if signs or symptoms of bleeding occur.  Normal dose: 1 tablet daily except 1/2 tablet on Sundays, Tuesdays and Thursdays.  Recheck INR on Monday 07/08/23  Coumadin Clinic [phone].

## 2023-07-05 NOTE — Telephone Encounter (Signed)
Virginia Franklin from [facility] call and stated she need a plan of care approve for OT 1 X a wk for 7 wk's. [name]'s phone # is [phone].

## 2023-07-08 ENCOUNTER — Telehealth: Payer: Self-pay

## 2023-07-08 LAB — POCT INR: INR: 4.5 — AB (ref 2.0–3.0)

## 2023-07-08 NOTE — Telephone Encounter (Signed)
Called [facility] to follow-up on INR that is due today. Home Health states they are running behind and will be checking INR between 4:30 - 5:00.

## 2023-07-08 NOTE — Telephone Encounter (Signed)
Called Home Health to confirm INR will be checked today. Spoke with Cianna who stated Home Health will be at pt's home between 2:00 and 3:00.

## 2023-07-08 NOTE — Telephone Encounter (Signed)
I think she should stay on the Lasix because this helps with swelling and with the low sodium levels. I agree that she can STOP the Lexapro and the Vytorin however

## 2023-07-09 ENCOUNTER — Ambulatory Visit (INDEPENDENT_AMBULATORY_CARE_PROVIDER_SITE_OTHER): Payer: Medicare HMO | Admitting: Cardiovascular Disease

## 2023-07-09 DIAGNOSIS — Z5181 Encounter for therapeutic drug level monitoring: Secondary | ICD-10-CM

## 2023-07-09 DIAGNOSIS — Z7901 Long term (current) use of anticoagulants: Secondary | ICD-10-CM

## 2023-07-09 DIAGNOSIS — Z952 Presence of prosthetic heart valve: Secondary | ICD-10-CM | POA: Diagnosis not present

## 2023-07-09 NOTE — Patient Instructions (Signed)
Description   Called and spoke to pt's daughter and instructed for pt to START taking warfarin 1/2 a  tablet daily except for 1 tablet on Mondays and Fridays. Instructed for pt to continue to drink Ensure. Recheck INR on 8/29 Coumadin Clinic [phone].

## 2023-07-11 ENCOUNTER — Telehealth: Payer: Self-pay | Admitting: Family Medicine

## 2023-07-11 LAB — POCT INR: INR: 2.2 (ref 2.0–3.0)

## 2023-07-11 NOTE — Telephone Encounter (Signed)
Report INR 2.2 has blood in stool, blackened stool with blood. Has pain in feet and knees

## 2023-07-11 NOTE — Telephone Encounter (Signed)
Spoke with Kandee Keen regarding pt since PCP is out of the office advised for pt to got to the ED. Called pt Daughter advised that Dr Clent Ridges has left for the day but I consulted another provider who advised for pt to go to the ED. Pt daughter stated that the bleeding is not to much and that she will have to call Ambulance to transport pt to the ED which is tough on pt she stated that she will think about it and may wait to call Dr Clent Ridges in the morning. Pt daughter is aware that I sent message to Dr Clent Ridges for advise.

## 2023-07-11 NOTE — Telephone Encounter (Signed)
Clydie Braun LPN with [facility] called to F/U.  She is asking about Coumadin?  Please return call, at your earliest convenience.   [phone]  *Okay to leave a detailed & confidential message on this line

## 2023-07-12 ENCOUNTER — Ambulatory Visit (INDEPENDENT_AMBULATORY_CARE_PROVIDER_SITE_OTHER): Payer: Medicare HMO | Admitting: *Deleted

## 2023-07-12 DIAGNOSIS — Z5181 Encounter for therapeutic drug level monitoring: Secondary | ICD-10-CM

## 2023-07-12 DIAGNOSIS — Z7901 Long term (current) use of anticoagulants: Secondary | ICD-10-CM | POA: Diagnosis not present

## 2023-07-12 DIAGNOSIS — Z952 Presence of prosthetic heart valve: Secondary | ICD-10-CM

## 2023-07-12 NOTE — Patient Instructions (Signed)
Description   Called and spoke to [facility] LPN and instructed for pt to follow PCP recommendations for bleeding issue (dark stool) they reported on yesterday and continue taking warfarin 1/2 tablet daily except for 1 tablet on Mondays and Fridays (bleeding issues). Instructed for pt to continue to drink Ensure. Recheck INR on 07/17/23. Coumadin Clinic [phone].

## 2023-07-12 NOTE — Telephone Encounter (Signed)
Tell her to hold the Coumadin for 2 days, then start back taking it daily on Monday 07-15-23. Recheck an INR on the middle of next week

## 2023-07-17 ENCOUNTER — Ambulatory Visit: Payer: Self-pay | Admitting: *Deleted

## 2023-07-17 ENCOUNTER — Telehealth: Payer: Self-pay | Admitting: Family Medicine

## 2023-07-17 DIAGNOSIS — Z952 Presence of prosthetic heart valve: Secondary | ICD-10-CM | POA: Diagnosis not present

## 2023-07-17 DIAGNOSIS — Z7901 Long term (current) use of anticoagulants: Secondary | ICD-10-CM

## 2023-07-17 DIAGNOSIS — Z5181 Encounter for therapeutic drug level monitoring: Secondary | ICD-10-CM | POA: Diagnosis not present

## 2023-07-17 LAB — POCT INR: INR: 3.8 — AB (ref 2.0–3.0)

## 2023-07-17 NOTE — Telephone Encounter (Signed)
Virginia Franklin from [facility] call and stated she a order to get a urine specimen.Virginia Franklin 's # is [phone].

## 2023-07-17 NOTE — Patient Instructions (Signed)
Description   Called and spoke to [facility] LPN and instructed for pt not tao take any warfarin today then START taking warfarin 1/2 tablet daily except for 1 tablet on Fridays. Instructed for pt to continue to drink Ensure. Recheck INR in 1 week. Coumadin Clinic [phone].

## 2023-07-18 ENCOUNTER — Other Ambulatory Visit: Payer: Self-pay

## 2023-07-18 ENCOUNTER — Other Ambulatory Visit (INDEPENDENT_AMBULATORY_CARE_PROVIDER_SITE_OTHER): Payer: Medicare HMO

## 2023-07-18 DIAGNOSIS — N390 Urinary tract infection, site not specified: Secondary | ICD-10-CM

## 2023-07-18 DIAGNOSIS — R410 Disorientation, unspecified: Secondary | ICD-10-CM

## 2023-07-18 LAB — POC URINALSYSI DIPSTICK (AUTOMATED)
Bilirubin, UA: NEGATIVE
Glucose, UA: NEGATIVE
Ketones, UA: NEGATIVE
Nitrite, UA: NEGATIVE
Protein, UA: POSITIVE — AB
Spec Grav, UA: 1.015 (ref 1.010–1.025)
Urobilinogen, UA: 0.2 U/dL
pH, UA: 6.5 (ref 5.0–8.0)

## 2023-07-18 MED ORDER — CIPROFLOXACIN HCL 500 MG PO TABS
500.0000 mg | ORAL_TABLET | Freq: Two times a day (BID) | ORAL | 0 refills | Status: AC
Start: 2023-07-18 — End: 2023-07-25

## 2023-07-18 NOTE — Telephone Encounter (Signed)
Daughter asking if there is another lab she can take her mother's urine specimen, says we are far and would like to find somewhere closer. She can be reached at [phone]

## 2023-07-18 NOTE — Telephone Encounter (Signed)
Spoke with Rosezena Sensor nurse advised of Dr Clent Ridges recommendation regarding pt INR / Coumadin. Verbalized understanding

## 2023-07-18 NOTE — Telephone Encounter (Signed)
Pt medication sent to pharmacy, pt daughter notified

## 2023-07-18 NOTE — Telephone Encounter (Signed)
Call in Cipro 500 mg BID for 7 days  

## 2023-07-19 NOTE — Telephone Encounter (Signed)
Attempted to call [name] with [facility] health, no option to leave a message. Will try again

## 2023-07-20 LAB — URINE CULTURE
MICRO NUMBER:: 15426353
SPECIMEN QUALITY:: ADEQUATE

## 2023-07-23 NOTE — Telephone Encounter (Signed)
Called left a message regarding approval for verbal orders

## 2023-07-24 ENCOUNTER — Telehealth: Payer: Self-pay | Admitting: Family Medicine

## 2023-07-24 ENCOUNTER — Telehealth: Payer: Self-pay | Admitting: *Deleted

## 2023-07-24 NOTE — Telephone Encounter (Signed)
Called pt's Home Health Nurse and left a message for her to call back regarding pt's INR.  Home Health nurse is going tomorrow. Will follow up tomorrow

## 2023-07-24 NOTE — Telephone Encounter (Signed)
Maurice with [facility] is calling to report pt had blood in stool on Sunday. I offered an appt and Everlean Alstrom will let family know. Please advise

## 2023-07-24 NOTE — Telephone Encounter (Signed)
She can simply stop the Vytorin. No need to taper it down. I agree, we should recheck fasting lipids in about 6 months

## 2023-07-24 NOTE — Telephone Encounter (Signed)
Spoke with [facility] nurse regarding OT orders requested by [place]. Advised that I have not been able to get him on the phone number provided with OT Therapy. [facility] Agent stated that they will call me back when they find which nurse is taking care of pt

## 2023-07-25 ENCOUNTER — Telehealth: Payer: Self-pay | Admitting: *Deleted

## 2023-07-25 NOTE — Telephone Encounter (Signed)
Leigh case manager states she will obtain the INR tomorrow because she has 2 LPN's out and she is out as well. She was advised to call to this number. Will follow up tomorrow.

## 2023-07-25 NOTE — Telephone Encounter (Signed)
Please call the family and get more information about this

## 2023-07-25 NOTE — Telephone Encounter (Signed)
Left detailed message for pt daughter to call the office with information regarding pt having bloody stools.

## 2023-07-26 ENCOUNTER — Ambulatory Visit (INDEPENDENT_AMBULATORY_CARE_PROVIDER_SITE_OTHER): Payer: Medicare HMO | Admitting: Cardiology

## 2023-07-26 DIAGNOSIS — F419 Anxiety disorder, unspecified: Secondary | ICD-10-CM

## 2023-07-26 DIAGNOSIS — Z7901 Long term (current) use of anticoagulants: Secondary | ICD-10-CM

## 2023-07-26 DIAGNOSIS — Z952 Presence of prosthetic heart valve: Secondary | ICD-10-CM

## 2023-07-26 DIAGNOSIS — S32511D Fracture of superior rim of right pubis, subsequent encounter for fracture with routine healing: Secondary | ICD-10-CM | POA: Diagnosis not present

## 2023-07-26 DIAGNOSIS — F0390 Unspecified dementia without behavioral disturbance: Secondary | ICD-10-CM | POA: Diagnosis not present

## 2023-07-26 DIAGNOSIS — I10 Essential (primary) hypertension: Secondary | ICD-10-CM | POA: Diagnosis not present

## 2023-07-26 DIAGNOSIS — M6281 Muscle weakness (generalized): Secondary | ICD-10-CM | POA: Diagnosis not present

## 2023-07-26 DIAGNOSIS — E785 Hyperlipidemia, unspecified: Secondary | ICD-10-CM

## 2023-07-26 DIAGNOSIS — Z5181 Encounter for therapeutic drug level monitoring: Secondary | ICD-10-CM

## 2023-07-26 DIAGNOSIS — M1990 Unspecified osteoarthritis, unspecified site: Secondary | ICD-10-CM

## 2023-07-26 LAB — POCT INR: INR: 2.9 (ref 2.0–3.0)

## 2023-07-30 NOTE — Telephone Encounter (Signed)
Not successful with trying to get pt daughter,left a message advised to call the office back

## 2023-07-31 ENCOUNTER — Ambulatory Visit (INDEPENDENT_AMBULATORY_CARE_PROVIDER_SITE_OTHER): Payer: Self-pay | Admitting: *Deleted

## 2023-07-31 DIAGNOSIS — Z5181 Encounter for therapeutic drug level monitoring: Secondary | ICD-10-CM | POA: Diagnosis not present

## 2023-07-31 DIAGNOSIS — Z7901 Long term (current) use of anticoagulants: Secondary | ICD-10-CM

## 2023-07-31 DIAGNOSIS — Z952 Presence of prosthetic heart valve: Secondary | ICD-10-CM

## 2023-07-31 LAB — POCT INR: INR: 2 (ref 2.0–3.0)

## 2023-07-31 NOTE — Patient Instructions (Signed)
Description   Called and spoke to Clydie Braun and instructed for pt to take 1 tablet of warfarin today then continue taking warfarin 1/2 tablet daily except for 1 tablet on Fridays. Instructed for pt to continue to drink Ensure. Recheck INR in 1 week. Coumadin Clinic [phone] or [phone].

## 2023-08-01 ENCOUNTER — Ambulatory Visit: Payer: Medicare HMO | Admitting: Physician Assistant

## 2023-08-05 NOTE — Telephone Encounter (Signed)
Spoke with home health nurse stated that she would speak to pt daughter regarding scheduling OV appointment for pt.

## 2023-08-08 ENCOUNTER — Telehealth: Payer: Self-pay

## 2023-08-08 LAB — POCT INR: INR: 1.9 — AB (ref 2.0–3.0)

## 2023-08-08 NOTE — Telephone Encounter (Signed)
Received a message from Gardner stating that pt is not her pt. If pt is seeing cardiology coumadin clinic for RN to reach out to cardiology.   Reach out to pt. Daughter answered. Ask daughter if pt is seeing coumadin clinic with cardiology. She denied.   Daughter states pt is bed-bound. A nurse through medicare is checking her INR every week. She continue the nurse said today INR was good and no need to do anything.   She states the nurse usually check INR. Then send it to Dr. Claris Che office? and  we call them if things need to change?   Advise her I will forward it to Dr. Clent Ridges and let him know.   Please advise.

## 2023-08-08 NOTE — Telephone Encounter (Signed)
Received a call from Gara Kroner, RN, Manager for medicine.  She states pt's INR is 1.9.

## 2023-08-09 ENCOUNTER — Ambulatory Visit (INDEPENDENT_AMBULATORY_CARE_PROVIDER_SITE_OTHER): Payer: Self-pay | Admitting: *Deleted

## 2023-08-09 ENCOUNTER — Telehealth: Payer: Self-pay | Admitting: *Deleted

## 2023-08-09 DIAGNOSIS — Z5181 Encounter for therapeutic drug level monitoring: Secondary | ICD-10-CM

## 2023-08-09 DIAGNOSIS — Z7901 Long term (current) use of anticoagulants: Secondary | ICD-10-CM

## 2023-08-09 DIAGNOSIS — Z952 Presence of prosthetic heart valve: Secondary | ICD-10-CM | POA: Diagnosis not present

## 2023-08-09 NOTE — Telephone Encounter (Signed)
This pt is managed by cardiology. Last visit with cardiology coumadin clinic was 9/18 and pt saw [name]. Forwarding note to Candance.

## 2023-08-09 NOTE — Patient Instructions (Addendum)
Description   08/09/23-LM for Alona Bene [facility] Case Manager RN and Larence Penning daughter & on DPR advised pt take 1.5 tablets of warfarin today then start taking warfarin 1/2 tablet daily except for 1 tablet on Mondays and Fridays. Instructed for pt to continue to drink Ensure-2 per day  Recheck INR in 1 week (will let us know when close to discharge).  Coumadin Clinic [phone] or [phone]

## 2023-08-09 NOTE — Telephone Encounter (Signed)
This pt is managed by cardiology. Last visit with cardiology coumadin clinic was 9/18 and pt saw [name]. Forwarding note to Candance.  The other telephone encounter containing the same information was forwarded to last cardiology coumadin clinic nurse that managed pt's warfarin.

## 2023-08-09 NOTE — Telephone Encounter (Signed)
Called Gypsy Decant with [facility] regarding the pt and INR. There was no answer so left a message to call back regarding INR. Will await a call back and follow up.

## 2023-08-09 NOTE — Telephone Encounter (Signed)
Noted! Thank you

## 2023-08-09 NOTE — Telephone Encounter (Signed)
Spoke with pt daughter states that she received a call today regarding this. Nothing further needed

## 2023-08-09 NOTE — Telephone Encounter (Addendum)
Called Leigh with [facility] and had to leave a message to call back regarding the message as Cardiology is just receiving this message from PCP office. Will await a call back to address INR from yesterday.
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
## 2023-08-14 ENCOUNTER — Telehealth: Payer: Self-pay | Admitting: *Deleted

## 2023-08-14 DIAGNOSIS — Z8673 Personal history of transient ischemic attack (TIA), and cerebral infarction without residual deficits: Secondary | ICD-10-CM

## 2023-08-14 DIAGNOSIS — F418 Other specified anxiety disorders: Secondary | ICD-10-CM

## 2023-08-14 DIAGNOSIS — R404 Transient alteration of awareness: Secondary | ICD-10-CM

## 2023-08-14 DIAGNOSIS — R4789 Other speech disturbances: Secondary | ICD-10-CM

## 2023-08-14 DIAGNOSIS — G300 Alzheimer's disease with early onset: Secondary | ICD-10-CM

## 2023-08-14 DIAGNOSIS — R5383 Other fatigue: Secondary | ICD-10-CM

## 2023-08-14 NOTE — Telephone Encounter (Signed)
Demetria ( RN from [employer]) is calling for the following:   The daughter called because the patient is lethargic and  somnolent.  Her breathing is normal. Her blood pressure is 123/65 with a pulse of 56.  She does not have a fever.  She does have 2 small blisters on her tailbone which ointment is being applied.       She would like to know if you would like     some labs or possibly a virtual visit?  2.  The family is considering palliative care.  Would you like a referral placed for palliative care?

## 2023-08-15 ENCOUNTER — Ambulatory Visit (INDEPENDENT_AMBULATORY_CARE_PROVIDER_SITE_OTHER): Payer: Medicare HMO | Admitting: *Deleted

## 2023-08-15 DIAGNOSIS — Z5181 Encounter for therapeutic drug level monitoring: Secondary | ICD-10-CM

## 2023-08-15 DIAGNOSIS — Z7901 Long term (current) use of anticoagulants: Secondary | ICD-10-CM

## 2023-08-15 DIAGNOSIS — Z952 Presence of prosthetic heart valve: Secondary | ICD-10-CM | POA: Diagnosis not present

## 2023-08-15 LAB — POCT INR: INR: 3.2 — AB (ref 2.0–3.0)

## 2023-08-15 NOTE — Telephone Encounter (Signed)
Yes please set up a virtual OV. Also contact Palliative Care to get her involved

## 2023-08-15 NOTE — Telephone Encounter (Signed)
Spoke to the daughter and the patient is doing much better today.  She declined the virtual visit.  Daughter did agree to Palliative care.  Referral placed.

## 2023-08-15 NOTE — Patient Instructions (Signed)
Description   08/15/23-Spoke Marisue Ivan Clinical Manager [facility] and spoke to Larence Penning daughter & on [place] advised pt to take 1/2 tablet of warfarin only tomorrow then continue taking warfarin 1/2 tablet daily except for 1 tablet on Mondays and Fridays. Instructed for pt to continue to drink Ensure-2 per day  Recheck INR in 1 week (will let us know when close to discharge).  Coumadin Clinic [phone] or [phone]

## 2023-08-20 NOTE — Telephone Encounter (Signed)
Patients daughter Lurena Joiner called and is inquiring about Palliative Care, Lurena Joiner called [facility] and they have not received the referral please fax referral to [phone].  Patient is getting confused with day/night, trying to get out of bed at night, patient is sun downing, can something be prescribed?  Patient is getting 2 anti-depressants at night, daughter is concerned these are not working.  Please advise at [phone]

## 2023-08-21 NOTE — Telephone Encounter (Signed)
Spoke with pt daughter Lurena Joiner, stated that Palliative care called her today and they are going out to see pt on Monday

## 2023-08-22 ENCOUNTER — Encounter: Payer: Self-pay | Admitting: Family Medicine

## 2023-08-22 ENCOUNTER — Ambulatory Visit (INDEPENDENT_AMBULATORY_CARE_PROVIDER_SITE_OTHER): Payer: Medicare HMO | Admitting: *Deleted

## 2023-08-22 DIAGNOSIS — Z5181 Encounter for therapeutic drug level monitoring: Secondary | ICD-10-CM | POA: Diagnosis not present

## 2023-08-22 DIAGNOSIS — Z952 Presence of prosthetic heart valve: Secondary | ICD-10-CM | POA: Diagnosis not present

## 2023-08-22 DIAGNOSIS — Z7901 Long term (current) use of anticoagulants: Secondary | ICD-10-CM

## 2023-08-22 LAB — POCT INR: INR: 2.1 (ref 2.0–3.0)

## 2023-08-22 NOTE — Patient Instructions (Addendum)
Description   08/22/23-Spoke Jasmine December RN [facility] advised to take 1 tablet of warfarin today then continue taking warfarin 1/2 tablet daily except for 1 tablet on Mondays and Fridays.  Instructed for pt to continue to drink Ensure-2 per day  Recheck INR in 1 week (will let us know when close to discharge).  Coumadin Clinic [phone] or [phone]

## 2023-08-28 ENCOUNTER — Other Ambulatory Visit: Payer: Self-pay | Admitting: Family Medicine

## 2023-08-28 NOTE — Telephone Encounter (Signed)
Call in Risperidone 1 mg to take every night at bedtime, #30 with 2 rf. This should help her rest

## 2023-08-29 ENCOUNTER — Ambulatory Visit (INDEPENDENT_AMBULATORY_CARE_PROVIDER_SITE_OTHER): Payer: Medicare HMO | Admitting: *Deleted

## 2023-08-29 DIAGNOSIS — Z5181 Encounter for therapeutic drug level monitoring: Secondary | ICD-10-CM

## 2023-08-29 DIAGNOSIS — Z952 Presence of prosthetic heart valve: Secondary | ICD-10-CM | POA: Diagnosis not present

## 2023-08-29 DIAGNOSIS — Z7901 Long term (current) use of anticoagulants: Secondary | ICD-10-CM

## 2023-08-29 LAB — POCT INR: INR: 2.1 (ref 2.0–3.0)

## 2023-08-29 MED ORDER — RISPERIDONE 1 MG PO TABS: 1.0000 mg | ORAL_TABLET | Freq: Every day | ORAL | 2 refills | Status: DC

## 2023-08-29 NOTE — Patient Instructions (Addendum)
Description   08/29/23-Spoke Pershing Cox RN [facility] & Lurena Joiner pt's daughter advised to take 1 tablet of warfarin today then START taking warfarin 1/2 tablet daily except for 1 tablet on Mondays, Wednesdays, and Fridays.  Instructed for pt to continue to drink Ensure-2 per day  Recheck INR in 1 week (will let us know when close to discharge).  Coumadin Clinic [phone] or [phone]

## 2023-09-02 ENCOUNTER — Encounter: Payer: Self-pay | Admitting: Physician Assistant

## 2023-09-03 NOTE — Telephone Encounter (Signed)
I understand what she is saying. Let's try the new medication (Risperidone) at bedtime, but STOP the Mirtazepine and STOP the Memantine pills. Report back in 2 weeks

## 2023-09-05 ENCOUNTER — Ambulatory Visit (INDEPENDENT_AMBULATORY_CARE_PROVIDER_SITE_OTHER): Payer: Medicare HMO | Admitting: *Deleted

## 2023-09-05 DIAGNOSIS — Z7901 Long term (current) use of anticoagulants: Secondary | ICD-10-CM

## 2023-09-05 DIAGNOSIS — Z952 Presence of prosthetic heart valve: Secondary | ICD-10-CM

## 2023-09-05 DIAGNOSIS — Z5181 Encounter for therapeutic drug level monitoring: Secondary | ICD-10-CM

## 2023-09-05 LAB — POCT INR: INR: 2.7 (ref 2.0–3.0)

## 2023-09-05 NOTE — Patient Instructions (Signed)
Description   Spoke Leigh RN [facility] advised to continue taking warfarin 1/2 tablet daily except for 1 tablet on Mondays, Wednesdays, and Fridays.  Instructed for pt to continue to drink Ensure-2 per day  Recheck INR in 2 weeks (will let us know when close to discharge).  Coumadin Clinic [phone] or [phone]

## 2023-09-06 MED ORDER — LORAZEPAM 0.5 MG PO TABS: ORAL_TABLET | ORAL | 2 refills | Status: DC

## 2023-09-06 NOTE — Telephone Encounter (Signed)
I understand. We will cancel the Risperidone. Instead I have sent in for Lorazepam to take as needed at bedtime. This is safe to take and will help her to relax. This should be out of her system by the next morning. As for checking the INR, yes this is something you can do at home. The test kits are for sale OTC at the pharmacy. I think they are easy to use, but if she prefers we can schedule a session with Carollee Herter to teach them how to do it

## 2023-09-16 MED ORDER — LORAZEPAM 0.5 MG PO TABS: ORAL_TABLET | ORAL | 1 refills | Status: DC

## 2023-09-16 NOTE — Telephone Encounter (Signed)
I sent the Lorazepam to [facility]. As for the INR test, check with [facility] on where to get these

## 2023-09-17 ENCOUNTER — Telehealth: Payer: Self-pay | Admitting: Family Medicine

## 2023-09-17 NOTE — Telephone Encounter (Signed)
Message was sent to pt via MyChart  

## 2023-09-17 NOTE — Telephone Encounter (Signed)
Yes I will continue to be her attending physician

## 2023-09-17 NOTE — Telephone Encounter (Signed)
Kwante with hospice called and would like to know if Dr. Clent Ridges is going to be pt's attending physician. Call back number: [phone] ext. [phone].

## 2023-09-17 NOTE — Telephone Encounter (Signed)
Hi Shannon, we have a pt who wants to start checking her INR at home but they can't find an inr home kit. Do you know of where they should get one. Please advise

## 2023-09-18 NOTE — Telephone Encounter (Signed)
Discussed with PCP, he is willing to prescribe warfarin and have the [facility] manage pt's warfarin, via home testing.   Will discuss with pt's daughter, Larence Penning.   Contacted pt's daughter and advised PCP can manage warfarin. Lurena Joiner reports palliative nurse came yesterday and they are transitioning pt to hospice care. She reports they do not recommend for pt to continue warfarin. They will d/c warfarin and start pt on a low dose aspirin. No INR testing will be needed. Advised this nurse will let PCP know and if anything is needed please do not hesitate to contact the office. Lurena Joiner verbalized understanding and was appreciative of the call.

## 2023-09-18 NOTE — Telephone Encounter (Signed)
INR machines and strips must be prescribed by a provider and sent to a company that can provide a machine. Once the pt is set up for home testing the result will be transmitted to the managing provider by the company that set the pt up for home testing. Since cardiology is managing the pt's warfarin, I will forward this message to them. Because once the pt is set up to home test, the results will need to be sent to cardiology for any dosing changes. The company will also educate the pt's family on use of the machine.

## 2023-09-18 NOTE — Telephone Encounter (Signed)
Per cardiology, Pamala Hurry, RN  Unfortunately we are not doing home testing under Cardiology. The patient and daughter were made aware of this in previous conversations. However, if this is something you all are willing to manage you can assist them with this.  Thank you Candance for your quick response.  Will discuss with PCP this afternoon concerning taking over management of warfarin.

## 2023-09-18 NOTE — Telephone Encounter (Signed)
Send message via MyChart

## 2023-09-18 NOTE — Telephone Encounter (Signed)
I totally agree with this course

## 2023-09-20 DIAGNOSIS — F0280 Dementia in other diseases classified elsewhere without behavioral disturbance: Secondary | ICD-10-CM | POA: Diagnosis not present

## 2023-09-20 DIAGNOSIS — F419 Anxiety disorder, unspecified: Secondary | ICD-10-CM

## 2023-09-20 DIAGNOSIS — S32511D Fracture of superior rim of right pubis, subsequent encounter for fracture with routine healing: Secondary | ICD-10-CM | POA: Diagnosis not present

## 2023-09-20 DIAGNOSIS — Z5181 Encounter for therapeutic drug level monitoring: Secondary | ICD-10-CM

## 2023-09-20 DIAGNOSIS — Z7901 Long term (current) use of anticoagulants: Secondary | ICD-10-CM

## 2023-09-20 DIAGNOSIS — I10 Essential (primary) hypertension: Secondary | ICD-10-CM | POA: Diagnosis not present

## 2023-09-20 DIAGNOSIS — G309 Alzheimer's disease, unspecified: Secondary | ICD-10-CM | POA: Diagnosis not present

## 2023-09-20 DIAGNOSIS — E785 Hyperlipidemia, unspecified: Secondary | ICD-10-CM

## 2023-09-20 DIAGNOSIS — M1990 Unspecified osteoarthritis, unspecified site: Secondary | ICD-10-CM

## 2023-09-20 NOTE — Telephone Encounter (Signed)
Sent to [facility] coumadin Nurse

## 2023-09-27 ENCOUNTER — Other Ambulatory Visit: Payer: Self-pay | Admitting: Family Medicine

## 2023-10-01 ENCOUNTER — Other Ambulatory Visit (HOSPITAL_COMMUNITY): Payer: Self-pay

## 2023-10-19 ENCOUNTER — Telehealth: Payer: Self-pay | Admitting: Family Medicine

## 2023-10-19 NOTE — Telephone Encounter (Signed)
Healthcare call system called to let us know that Virginia Franklin has passed away. Caller does not have information about time of death. She was recently admitted to hospice. Phone number: [phone]. Nicola Quesnell [place], MD

## 2023-11-04 ENCOUNTER — Telehealth: Payer: Self-pay

## 2023-11-04 NOTE — Telephone Encounter (Signed)
Please okay these orders  ?

## 2023-11-04 NOTE — Telephone Encounter (Signed)
Copied from CRM [id number]. Topic: Clinical - Home Health Verbal Orders >> Nov 04, 2023 11:14 AM Claudine Mouton wrote: Caller/Agency: [facility] Callback Number: [phone] Service Requested: Skilled Nursing Frequency: s/10 1 week 2, pt 1 week 2, ot 1 week 2.  Any new concerns about the patient? No  Sonya w/ [facility] called to follow up on a request for Plan of Care order to be signed from 10/23/23. Please assist.   Phone: [phone] Fax: [phone]

## 2023-11-04 NOTE — Telephone Encounter (Signed)
Attempted to call [facility] but not able to speak with an agent, will try again later

## 2023-11-05 NOTE — Telephone Encounter (Signed)
Left a message with Marisue Ivan for Lamar Laundry to return our call.

## 2023-11-07 NOTE — Telephone Encounter (Signed)
Pt forms faxed to [facility], attempted  to call Marisue Ivan or [place] with no success. Will try later

## 2023-11-08 NOTE — Telephone Encounter (Signed)
Spoke with Lamar Laundry with [facility] stated that she received pt orders

## 2023-11-13 DEATH — deceased

## 2024-01-06 ENCOUNTER — Telehealth: Payer: Self-pay

## 2024-01-06 NOTE — Telephone Encounter (Signed)
 Copied from CRM [id number]. Topic: General - Other >> Jan 06, 2024  3:58 PM Kathryne Eriksson wrote: Reason for CRM: [facility] >> Jan 06, 2024  4:02 PM Kathryne Eriksson wrote: [facility] [phone] ext # [phone]  Called on behalf of decreased patient, stating that Dr. Clent Ridges entered the incorrect time and date of patient. This information needs to be updated to 10/19/2023 at 12:30AM in the [facility] System so that they can proceed with billing. Once this is completed, Virginia Franklin is requesting a call back also gives the ok to leave a message.

## 2024-01-10 ENCOUNTER — Telehealth: Payer: Self-pay | Admitting: Family Medicine

## 2024-01-10 NOTE — Telephone Encounter (Signed)
 [id number] is calling and jill is calling checking on update date of death on death certification thru [facility] system

## 2024-01-10 NOTE — Telephone Encounter (Signed)
 Copied from CRM [id number]. Topic: General - Other >> Jan 10, 2024  9:56 AM Adele Barthel wrote: Reason for CRM:   [facility] is calling in regards to provider putting incorrect date of death on the death certificate. She contacted the office on 02/24 to verify if he had been able to update the date in the [facility]. Contacted [employer], spoke with Nelva Bush who said she will forward a message to provider regarding this issue.  Noreene Larsson is requesting call back if possible when the date is updated, due to waiting on information to complete their billing for patient.   CB# [phone] ext # [phone]

## 2024-01-14 NOTE — Telephone Encounter (Signed)
  [facility] advised that Dr Clent Ridges has updated the dates on pt death certificate. Verbalized understanding
